# Patient Record
Sex: Male | Born: 1975 | Race: Black or African American | Hispanic: No | Marital: Married | Smoking: Never smoker
Health system: Southern US, Community
[De-identification: ages and names within clinical notes are randomized; demographics above are authoritative.]

## PROBLEM LIST (undated history)

## (undated) DIAGNOSIS — M1990 Unspecified osteoarthritis, unspecified site: Secondary | ICD-10-CM

## (undated) DIAGNOSIS — M1612 Unilateral primary osteoarthritis, left hip: Secondary | ICD-10-CM

## (undated) DIAGNOSIS — D571 Sickle-cell disease without crisis: Secondary | ICD-10-CM

## (undated) DIAGNOSIS — I10 Essential (primary) hypertension: Secondary | ICD-10-CM

## (undated) DIAGNOSIS — F329 Major depressive disorder, single episode, unspecified: Secondary | ICD-10-CM

## (undated) DIAGNOSIS — F32A Depression, unspecified: Secondary | ICD-10-CM

## (undated) DIAGNOSIS — E119 Type 2 diabetes mellitus without complications: Secondary | ICD-10-CM

## (undated) HISTORY — PX: JOINT REPLACEMENT: SHX530

## (undated) HISTORY — DX: Type 2 diabetes mellitus without complications: E11.9

---

## 2005-01-14 ENCOUNTER — Ambulatory Visit: Payer: Self-pay | Admitting: Nurse Practitioner

## 2008-12-01 ENCOUNTER — Encounter: Admission: RE | Admit: 2008-12-01 | Discharge: 2008-12-01 | Payer: Self-pay | Admitting: Specialist

## 2010-09-29 HISTORY — PX: JOINT REPLACEMENT: SHX530

## 2010-11-20 ENCOUNTER — Other Ambulatory Visit (HOSPITAL_BASED_OUTPATIENT_CLINIC_OR_DEPARTMENT_OTHER): Payer: Self-pay | Admitting: Orthopedic Surgery

## 2010-11-20 ENCOUNTER — Ambulatory Visit (HOSPITAL_COMMUNITY)
Admission: RE | Admit: 2010-11-20 | Discharge: 2010-11-20 | Disposition: A | Discharge status: Home / Self Care | Payer: Managed Care, Other (non HMO) | Source: Ambulatory Visit | Attending: Orthopedic Surgery | Admitting: Orthopedic Surgery

## 2010-11-20 ENCOUNTER — Encounter (HOSPITAL_COMMUNITY)
Admission: RE | Admit: 2010-11-20 | Discharge: 2010-11-20 | Disposition: A | Discharge status: Home / Self Care | Payer: Managed Care, Other (non HMO) | Source: Ambulatory Visit | Attending: Orthopedic Surgery | Admitting: Orthopedic Surgery

## 2010-11-20 DIAGNOSIS — M1610 Unilateral primary osteoarthritis, unspecified hip: Secondary | ICD-10-CM | POA: Insufficient documentation

## 2010-11-20 DIAGNOSIS — Z01818 Encounter for other preprocedural examination: Secondary | ICD-10-CM | POA: Insufficient documentation

## 2010-11-20 DIAGNOSIS — M169 Osteoarthritis of hip, unspecified: Secondary | ICD-10-CM | POA: Insufficient documentation

## 2010-11-20 DIAGNOSIS — Z01812 Encounter for preprocedural laboratory examination: Secondary | ICD-10-CM | POA: Insufficient documentation

## 2010-11-20 DIAGNOSIS — Z01810 Encounter for preprocedural cardiovascular examination: Secondary | ICD-10-CM | POA: Insufficient documentation

## 2010-11-20 LAB — CBC
HCT: 35.3 % — ABNORMAL LOW (ref 39.0–52.0)
Hemoglobin: 12.7 g/dL — ABNORMAL LOW (ref 13.0–17.0)
MCH: 29.4 pg (ref 26.0–34.0)
MCHC: 36 g/dL (ref 30.0–36.0)
MCV: 81.7 fL (ref 78.0–100.0)
Platelets: 313 10*3/uL (ref 150–400)
RBC: 4.32 MIL/uL (ref 4.22–5.81)
RDW: 16.4 % — ABNORMAL HIGH (ref 11.5–15.5)
WBC: 9.3 10*3/uL (ref 4.0–10.5)

## 2010-11-20 LAB — PROTIME-INR
INR: 0.93 (ref 0.00–1.49)
Prothrombin Time: 12.7 seconds (ref 11.6–15.2)

## 2010-11-20 LAB — BASIC METABOLIC PANEL
BUN: 9 mg/dL (ref 6–23)
CO2: 32 mEq/L (ref 19–32)
Calcium: 9.9 mg/dL (ref 8.4–10.5)
Chloride: 104 mEq/L (ref 96–112)
Creatinine, Ser: 0.93 mg/dL (ref 0.4–1.5)
GFR calc Af Amer: >60 mL/min (ref >60)
GFR calc non Af Amer: >60 mL/min (ref >60)
Glucose, Bld: 81 mg/dL (ref 70–99)
Potassium: 4.1 mEq/L (ref 3.5–5.1)
Sodium: 142 mEq/L (ref 135–145)

## 2010-11-20 LAB — SURGICAL PCR SCREEN
MRSA, PCR: NEGATIVE
Staphylococcus aureus: POSITIVE — AB

## 2010-11-26 ENCOUNTER — Inpatient Hospital Stay (HOSPITAL_COMMUNITY)
Admission: RE | Admit: 2010-11-26 | Discharge: 2010-11-29 | DRG: 470 | Disposition: A | Discharge status: Home Health | Payer: Managed Care, Other (non HMO) | Source: Ambulatory Visit | Attending: Orthopedic Surgery | Admitting: Orthopedic Surgery

## 2010-11-26 ENCOUNTER — Inpatient Hospital Stay (HOSPITAL_COMMUNITY): Payer: Managed Care, Other (non HMO)

## 2010-11-26 DIAGNOSIS — D573 Sickle-cell trait: Secondary | ICD-10-CM | POA: Diagnosis present

## 2010-11-26 DIAGNOSIS — I10 Essential (primary) hypertension: Secondary | ICD-10-CM | POA: Diagnosis present

## 2010-11-26 DIAGNOSIS — J9819 Other pulmonary collapse: Secondary | ICD-10-CM | POA: Diagnosis not present

## 2010-11-26 DIAGNOSIS — E7800 Pure hypercholesterolemia, unspecified: Secondary | ICD-10-CM | POA: Diagnosis present

## 2010-11-26 DIAGNOSIS — M87059 Idiopathic aseptic necrosis of unspecified femur: Principal | ICD-10-CM | POA: Diagnosis present

## 2010-11-26 DIAGNOSIS — Z79899 Other long term (current) drug therapy: Secondary | ICD-10-CM

## 2010-11-26 DIAGNOSIS — Z7982 Long term (current) use of aspirin: Secondary | ICD-10-CM

## 2010-11-26 DIAGNOSIS — E119 Type 2 diabetes mellitus without complications: Secondary | ICD-10-CM | POA: Diagnosis present

## 2010-11-26 LAB — GLUCOSE, CAPILLARY
Glucose-Capillary: 120 mg/dL — ABNORMAL HIGH (ref 70–99)
Glucose-Capillary: 120 mg/dL — ABNORMAL HIGH (ref 70–99)
Glucose-Capillary: 151 mg/dL — ABNORMAL HIGH (ref 70–99)
Glucose-Capillary: 142 mg/dL — ABNORMAL HIGH (ref 70–99)

## 2010-11-26 LAB — TYPE AND SCREEN
ABO/RH(D): O NEG
Antibody Screen: NEGATIVE

## 2010-11-26 LAB — ABO/RH: ABO/RH(D): O NEG

## 2010-11-27 ENCOUNTER — Inpatient Hospital Stay (HOSPITAL_COMMUNITY): Payer: Managed Care, Other (non HMO)

## 2010-11-27 LAB — PROTIME-INR
INR: 1.08 (ref 0.00–1.49)
Prothrombin Time: 14.2 seconds (ref 11.6–15.2)

## 2010-11-27 LAB — BASIC METABOLIC PANEL
BUN: 14 mg/dL (ref 6–23)
CO2: 28 mEq/L (ref 19–32)
Calcium: 8.1 mg/dL — ABNORMAL LOW (ref 8.4–10.5)
Chloride: 101 mEq/L (ref 96–112)
Creatinine, Ser: 1.21 mg/dL (ref 0.4–1.5)
GFR calc Af Amer: >60 mL/min (ref >60)
GFR calc non Af Amer: >60 mL/min (ref >60)
Glucose, Bld: 131 mg/dL — ABNORMAL HIGH (ref 70–99)
Potassium: 4 mEq/L (ref 3.5–5.1)
Sodium: 138 mEq/L (ref 135–145)

## 2010-11-27 LAB — URINALYSIS, ROUTINE W REFLEX MICROSCOPIC
Bilirubin Urine: NEGATIVE
Hgb urine dipstick: NEGATIVE
Ketones, ur: NEGATIVE mg/dL
Nitrite: NEGATIVE
Protein, ur: NEGATIVE mg/dL
Specific Gravity, Urine: 1.019 (ref 1.005–1.030)
Urine Glucose, Fasting: NEGATIVE mg/dL
Urobilinogen, UA: 1 mg/dL (ref 0.0–1.0)
pH: 6 (ref 5.0–8.0)

## 2010-11-27 LAB — CBC
HCT: 26.6 % — ABNORMAL LOW (ref 39.0–52.0)
Hemoglobin: 9.3 g/dL — ABNORMAL LOW (ref 13.0–17.0)
MCH: 27.9 pg (ref 26.0–34.0)
MCHC: 35 g/dL (ref 30.0–36.0)
MCV: 79.9 fL (ref 78.0–100.0)
Platelets: 284 10*3/uL (ref 150–400)
RBC: 3.33 MIL/uL — ABNORMAL LOW (ref 4.22–5.81)
RDW: 16.6 % — ABNORMAL HIGH (ref 11.5–15.5)
WBC: 15.2 10*3/uL — ABNORMAL HIGH (ref 4.0–10.5)

## 2010-11-27 LAB — URINE MICROSCOPIC-ADD ON

## 2010-11-27 LAB — GLUCOSE, CAPILLARY
Glucose-Capillary: 111 mg/dL — ABNORMAL HIGH (ref 70–99)
Glucose-Capillary: 131 mg/dL — ABNORMAL HIGH (ref 70–99)

## 2010-11-28 LAB — BASIC METABOLIC PANEL
BUN: 15 mg/dL (ref 6–23)
CO2: 28 mEq/L (ref 19–32)
Calcium: 7.9 mg/dL — ABNORMAL LOW (ref 8.4–10.5)
Chloride: 109 mEq/L (ref 96–112)
Creatinine, Ser: 1.29 mg/dL (ref 0.4–1.5)
GFR calc Af Amer: >60 mL/min (ref >60)
GFR calc non Af Amer: >60 mL/min (ref >60)
Glucose, Bld: 141 mg/dL — ABNORMAL HIGH (ref 70–99)
Potassium: 3.9 mEq/L (ref 3.5–5.1)
Sodium: 141 mEq/L (ref 135–145)

## 2010-11-28 LAB — CBC
HCT: 22 % — ABNORMAL LOW (ref 39.0–52.0)
Hemoglobin: 7.9 g/dL — ABNORMAL LOW (ref 13.0–17.0)
MCH: 29 pg (ref 26.0–34.0)
MCHC: 35.9 g/dL (ref 30.0–36.0)
MCV: 80.9 fL (ref 78.0–100.0)
Platelets: 245 10*3/uL (ref 150–400)
RBC: 2.72 MIL/uL — ABNORMAL LOW (ref 4.22–5.81)
RDW: 16.5 % — ABNORMAL HIGH (ref 11.5–15.5)
WBC: 17.3 10*3/uL — ABNORMAL HIGH (ref 4.0–10.5)

## 2010-11-28 LAB — PROTIME-INR
INR: 1.34 (ref 0.00–1.49)
Prothrombin Time: 16.8 seconds — ABNORMAL HIGH (ref 11.6–15.2)

## 2010-11-28 LAB — URINE CULTURE
Colony Count: NO GROWTH
Culture  Setup Time: 201202291754
Culture: NO GROWTH
Special Requests: NEGATIVE

## 2010-11-28 LAB — GLUCOSE, CAPILLARY
Glucose-Capillary: 142 mg/dL — ABNORMAL HIGH (ref 70–99)
Glucose-Capillary: 155 mg/dL — ABNORMAL HIGH (ref 70–99)
Glucose-Capillary: 161 mg/dL — ABNORMAL HIGH (ref 70–99)

## 2010-11-28 NOTE — Op Note (Signed)
Nicolas [name], Nicolas [name] NO.:  [id number]  MEDICAL RECORD NO.:  [id number]           PATIENT TYPE:  I  LOCATION:  5002                         FACILITY:  [facility]  PHYSICIAN:  Eulas Post, MD    DATE OF BIRTH:  [birth year]  DATE OF PROCEDURE:  11/26/2010 DATE OF DISCHARGE:                              OPERATIVE REPORT   PREOPERATIVE DIAGNOSIS:  Right hip avascular necrosis.  POSTOPERATIVE DIAGNOSIS:  Right hip avascular necrosis.  OPERATIVE PROCEDURE:  Right total hip arthroplasty.  ANESTHESIA:  General.  ESTIMATED BLOOD LOSS:  300 mL.  SURGEON:  Eulas Post, MD  FIRST ASSISTANT:  Skip Mayer, PA-C.  OPERATIVE IMPLANTS:  DePuy [place] size 2 standard femoral stem 130-mm Press-Fit with a size 52 Pinnacle acetabular shell with a size 36-mm 10- degree +4 lipped liner with a size 36 +5 femoral head and a single apex hole eliminator.  PREOPERATIVE INDICATIONS:  [name] is a 35 year old gentleman who complained of severe right hip pain.  He failed conservative measures and had substantial leg loss as far as length goes, as well as severe arthritic pain and limitation with daily activities.  He elected to undergo total hip arthroplasty.  The risks, benefits, and alternatives were discussed with him before the procedure including but not limited to the risks of infection, bleeding, nerve injury, loosening, the need for revision surgery, leg-length discrepancy, sciatic nerve injury, dislocation, blood clots, cardiopulmonary complications, the need for revision surgery, among others, and he is willing to proceed.  OPERATIVE PROCEDURE:  The patient was brought to the operating room, placed in the supine position.  IV antibiotics were given.  General anesthesia administered.  He was turned in lateral decubitus position. The right lower extremity was prepped and draped in usual sterile fashion.  Posterolateral approach to the hip was performed.   Piriformis and external rotators were released off the bone and tagged with FiberWire.  The capsule was teed.  I then dislocated the hip.  I resected the femoral neck slightly less than a thumb's breadth from the lesser trochanter.  The head itself was extremely destroyed.  I resected the proximal femur using the appropriate jig and saw.  I then exposed the acetabulum.  Deep retractors were placed, including two wings, and the acetabulum was exposed.  There was a large segment of fragmented chondral femoral head that was still within the acetabulum, due to its destructive nature, and this was removed.  I also cleaned the pulvinar and then removed the labrum.  The acetabular geometry was identified.  I then sequentially reamed starting with a 42.  I medialized slightly and then reamed up to a size 52.  This provided excellent coverage and excellent fit.  I trialed and then placed the real acetabular component. I did use a lipped liner.  A trial lip was placed.  I had excellent snug fit with the impaction of the component, and therefore I did not need screws.  The bone quality was excellent.  I then turned my attention to the femur.  The femur was exposed and I used the cookie cutter followed  by the lateralizing reamer.  His canal was very tight, and I took extra efforts to get lateral to make sure that I was not in varus.  Nonetheless, this was fairly challenging to get all the way down the femur, and even the canal finder had a difficult time finding its way down.  I lateralized at least three times, and then ultimately was able to get a clean path down the femur. Even still, the femoral shaft itself was fairly tight.  I sequentially reamed up to a size 3, and then broached, and a size 2 was the largest that would go down.  Therefore, a size 2 was trialed first with the +1 and then with the +5 femoral neck.  Leg lengths were short bilaterally, due to his contralateral disease, and so  there was no real good reference.  Nonetheless, the +5 was not too difficult to reduce, and provided excellent stability.  Therefore, the +5 was selected.  I debated on using a lipped liner versus a neutral liner, because even with the neutral liner the hip was extremely stable, but nonetheless given his young age, and predilection towards activities that would require bending and stooping, I selected a lipped liner for additional posterior coverage.  I then removed all the trial components and placed a real liner into the acetabulum.  All of the winged retractors had already been removed.  I prior to this did place a hole eliminator, and then after securing the lipped liner I then turned my attention back to the femur.  The real implant was implanted and it sat down reasonably well, although it was somewhat taller than normally would, but this was the smallest implant possible.  He had excellent rotational stability.  I trialed once more with the +5 and it restored leg length back to a normal alignment and position based on soft tissue tension from the external rotators.  Therefore, the +5 was selected and then [facility] into place and the hip was reduced.  It was extremely stable throughout a functional range of motion.  I irrigated the wounds copiously and then repaired the capsule with #2 FiberWire through drill holes in the femur using a 2-mm drill bit.  I irrigated once more and repaired the fascia with #1 Vicryl followed by 2-0 for the subcutaneous tissue and 4-0 Monocryl for the skin.  Steri-Strips and sterile gauze were also applied.  He was also injected with Marcaine.  He was awakened and turned supine and returned to the PACU in stable and satisfactory condition.  There were no complications.  He tolerated the procedure well.  Skip Mayer, PA-C, was present and scrubbed and critical throughout the case for retraction, exposure, reduction, and dislocation.     Eulas Post, MD     JPL/MEDQ  D:  11/26/2010  T:  11/27/2010  Job:  [id number]  Electronically Signed by [name] MD on 11/28/2010 10:51:22 AM

## 2010-11-29 LAB — GLUCOSE, CAPILLARY
Glucose-Capillary: 164 mg/dL — ABNORMAL HIGH (ref 70–99)
Glucose-Capillary: 113 mg/dL — ABNORMAL HIGH (ref 70–99)

## 2010-11-29 LAB — PROTIME-INR
INR: 1.87 — ABNORMAL HIGH (ref 0.00–1.49)
Prothrombin Time: 21.7 seconds — ABNORMAL HIGH (ref 11.6–15.2)

## 2010-12-02 NOTE — Discharge Summary (Signed)
  NAMEDAMERE, BRANDENBURG NO.:  [id number]  MEDICAL RECORD NO.:  [id number]           PATIENT TYPE:  I  LOCATION:  5002                         FACILITY:  [facility]  PHYSICIAN:  Eulas Post, MD    DATE OF BIRTH:  [birth year]  DATE OF ADMISSION:  11/26/2010 DATE OF DISCHARGE:  11/29/2010                              DISCHARGE SUMMARY   ADMISSION DIAGNOSIS:  Right hip avascular necrosis.  DISCHARGE DIAGNOSIS:  Right hip avascular necrosis.  PRIMARY PROCEDURE:  Right total hip arthroplasty.  ADDITIONAL DIAGNOSIS:  Expected postoperative blood loss anemia.  HOSPITAL COURSE:  [name] is a 35 year old gentleman who elected to have a right total hip arthroplasty after he had avascular necrosis and severe changes in the femoral head.  He tolerated the procedure well and postoperatively, he did not have any complications. His wounds were clean and dry and he did have a hematoma in his thigh. He is a very muscular gentleman.  He was given perioperative antibiotics for antimicrobial prophylaxis.  He was also given sequential compression devices and Coumadin as well as Lovenox for DVT prophylaxis.  On postoperative day #1, he had a fever of 103.1.  His urinalysis was checked which was negative and chest x-ray did show atelectasis.  He also had a thigh hematoma with a drop in his hemoglobin to 7.9.  He did not demonstrate any signs of sepsis in his wound.  He was placed on incentive spirometer and ambulation.  This improved, although he did have fevers on the day prior to discharge up to 101.7, but temperature current is 98.2.  His dressings were changed on postoperative day #2 and his wounds were clean and dry.  He was neurologically intact.  He benefited maximally from this hospital stay and is being discharged home on Coumadin as well as Percocet and his home medication regimen.  I am going to plan to see him back in another 2 weeks.  He will call if he is  having recurrent fevers or wound issues.  We will also have Home Health working with him to monitor his INR as well as take care of his wounds.     Eulas Post, MD     JPL/MEDQ  D:  11/29/2010  T:  11/29/2010  Job:  [id number]  Electronically Signed by [name] MD on 12/02/2010 08:29:59 AM

## 2010-12-04 LAB — CULTURE, BLOOD (ROUTINE X 2)
Culture  Setup Time: 201203010049
Culture  Setup Time: 201203010049
Culture: NO GROWTH
Culture: NO GROWTH

## 2012-02-06 ENCOUNTER — Emergency Department (HOSPITAL_COMMUNITY)
Admission: EM | Admit: 2012-02-06 | Discharge: 2012-02-06 | Disposition: A | Discharge status: Home / Self Care | Payer: Worker's Compensation | Attending: Emergency Medicine | Admitting: Emergency Medicine

## 2012-02-06 ENCOUNTER — Encounter (HOSPITAL_COMMUNITY): Payer: Self-pay | Admitting: Emergency Medicine

## 2012-02-06 DIAGNOSIS — Y9269 Other specified industrial and construction area as the place of occurrence of the external cause: Secondary | ICD-10-CM | POA: Insufficient documentation

## 2012-02-06 DIAGNOSIS — X500XXA Overexertion from strenuous movement or load, initial encounter: Secondary | ICD-10-CM | POA: Insufficient documentation

## 2012-02-06 DIAGNOSIS — M545 Low back pain, unspecified: Secondary | ICD-10-CM

## 2012-02-06 DIAGNOSIS — I10 Essential (primary) hypertension: Secondary | ICD-10-CM | POA: Insufficient documentation

## 2012-02-06 DIAGNOSIS — Z96649 Presence of unspecified artificial hip joint: Secondary | ICD-10-CM | POA: Insufficient documentation

## 2012-02-06 DIAGNOSIS — Y990 Civilian activity done for income or pay: Secondary | ICD-10-CM | POA: Insufficient documentation

## 2012-02-06 MED ORDER — DIAZEPAM 5 MG PO TABS
5.0000 mg | ORAL_TABLET | Freq: Once | ORAL | Status: AC
  Administered 2012-02-06: 5 mg via ORAL
  Filled 2012-02-06: qty 1

## 2012-02-06 MED ORDER — OXYCODONE-ACETAMINOPHEN 5-325 MG PO TABS: 2.0000 | ORAL_TABLET | ORAL | Status: AC | PRN

## 2012-02-06 MED ORDER — DIAZEPAM 5 MG PO TABS: 5.0000 mg | ORAL_TABLET | Freq: Three times a day (TID) | ORAL | Status: AC | PRN

## 2012-02-06 MED ORDER — OXYCODONE-ACETAMINOPHEN 5-325 MG PO TABS
2.0000 | ORAL_TABLET | Freq: Once | ORAL | Status: AC
  Administered 2012-02-06: 2 via ORAL
  Filled 2012-02-06: qty 2

## 2012-02-06 NOTE — ED Notes (Signed)
Pt discharge instructions reviewed, questions answered. Prescription instructions reviewed, questions answered. Pt discharged home in stable condition with family.

## 2012-02-06 NOTE — ED Notes (Signed)
Pt complains of lower back pain that started on 4/26 while he was lifting a heavy object at work. Pt states he has seen the occupational health Dr and was told he need to be on lite duty but his work would not follow these directions and had him doing heavy lifting again tonight when he re injured his back. Pt has lower back tenderness and pain with twisting movents.

## 2012-02-06 NOTE — ED Notes (Signed)
PT. 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

## 2012-02-06 NOTE — ED Provider Notes (Signed)
History     CSN: [id number]  Arrival date & time 02/06/12  0125   First MD Initiated Contact with Patient 02/06/12 0145      Chief Complaint  Patient presents with  . Back Pain    (Consider location/radiation/quality/duration/timing/severity/associated sxs/prior treatment) HPI 36 year old male presents emergency department complaining of persistent right low back pain. Patient reports he had surgery a year ago with right total hip replacement. Patient was at work 2 weeks ago when he turned and lifted something heavy, and developed pain in his right lower back. Patient thinks people muscle. Patient was seen by his primary care doctor a week ago and started on 3 medicines. He was seen this past Monday by the occupational physician and is pending a visit with chiropractor. Patient reports he had x-rays on Monday. Patient reports tonight despite being on light duty he is expected to do several tasks that aggravated his back. Patient denies any weakness or numbness no bowel or bladder dysfunction. No fevers, no midline point tenderness. No other red flags at this time Past Medical History  Diagnosis Date  . Hypertension     History reviewed. No pertinent past surgical history.  No family history on file.  History  Substance Use Topics  . Smoking status: Current Everyday Smoker  . Smokeless tobacco: Not on file  . Alcohol Use: No      Review of Systems  All other systems reviewed and are negative.    Allergies  Review of patient's allergies indicates no known allergies.  Home Medications   Current Outpatient Rx  Name Route Sig Dispense Refill  . CYCLOBENZAPRINE HCL 10 MG PO TABS Oral Take 10 mg by mouth 2 (two) times daily as needed. For spasms    . MELOXICAM 15 MG PO TABS Oral Take 15 mg by mouth daily.    . TRAMADOL-ACETAMINOPHEN 37.5-325 MG PO TABS Oral Take 2 tablets by mouth every 4 (four) hours as needed. For pain      BP 185/87  Pulse 110  Temp(Src) 98.1 F  (36.7 C) (Oral)  Resp 22  SpO2 100%  Physical Exam  Nursing note and vitals reviewed. Constitutional: He is oriented to person, place, and time. He appears well-developed and well-nourished.        Patient noted to be hypertensive, moderate amount of pain on evaluation  HENT:  Head: Normocephalic and atraumatic.  Eyes: EOM are normal. Pupils are equal, round, and reactive to light.  Neck: Normal range of motion. Neck supple. No JVD present. No tracheal deviation present. No thyromegaly present.  Cardiovascular: Normal rate, regular rhythm, normal heart sounds and intact distal pulses.  Exam reveals no gallop and no friction rub.   No murmur heard. Pulmonary/Chest: Effort normal and breath sounds normal. No stridor. No respiratory distress. He has no wheezes. He has no rales. He exhibits no tenderness.  Abdominal: Soft. Bowel sounds are normal. He exhibits no distension and no mass. There is no tenderness. There is no rebound and no guarding.  Musculoskeletal: Normal range of motion. He exhibits no edema and no tenderness.       Tenderness with palpation to paraspinal muscles in lumbar region of back right greater than left. Straight leg raise positive on right. Normal reflexes normal sensation normal movement of lower extremities  Lymphadenopathy:    He has no cervical adenopathy.  Neurological: He is alert and oriented to person, place, and time. He has normal reflexes. He displays normal reflexes. No cranial nerve deficit. He  exhibits normal muscle tone. Coordination normal.  Skin: Skin is warm and dry. No rash noted. No erythema. No pallor.  Psychiatric: He has a normal mood and affect. His behavior is normal. Judgment and thought content normal.    ED Course  Procedures (including critical care time)  Labs Reviewed - No data to display No results found.   No diagnosis found.    MDM  Low back pain in 36 year old male. Suspect ongoing muscle strain. Patient has followup  through his work planned. Will change medications and add Valium and Percocet remove Flexeril and Ultracet.         Olivia Mackie, MD 02/06/12 0300

## 2012-02-06 NOTE — Discharge Instructions (Signed)
Please followup with a chiropractor as planned with your work. Stop taking Flexeril and Ultracet. Start taking Percocet and Valium instead. Use warm heat such as a heating pad over the areas of pain. Return to the emergency department for worsening condition or new concerning symptoms.  Back Pain, Adult Low back pain is very common. About 1 in 5 people have back pain.The cause of low back pain is rarely dangerous. The pain often gets better over time.About half of people with a sudden onset of back pain feel better in just 2 weeks. About 8 in 10 people feel better by 6 weeks.  CAUSES Some common causes of back pain include:  Strain of the muscles or ligaments supporting the spine.   Wear and tear (degeneration) of the spinal discs.   Arthritis.   Direct injury to the back.  DIAGNOSIS Most of the time, the direct cause of low back pain is not known.However, back pain can be treated effectively even when the exact cause of the pain is unknown.Answering your caregiver's questions about your overall health and symptoms is one of the most accurate ways to make sure the cause of your pain is not dangerous. If your caregiver needs more information, he or she may order lab work or imaging tests (X-rays or MRIs).However, even if imaging tests show changes in your back, this usually does not require surgery. HOME CARE INSTRUCTIONS For many people, back pain returns.Since low back pain is rarely dangerous, it is often a condition that people can learn to [facility] their own.   Remain active. It is stressful on the back to sit or stand in one place. Do not sit, drive, or stand in one place for more than 30 minutes at a time. Take short walks on level surfaces as soon as pain allows.Try to increase the length of time you walk each day.   Do not stay in bed.Resting more than 1 or 2 days can delay your recovery.   Do not avoid exercise or work.Your body is made to move.It is not dangerous to be  active, even though your back may hurt.Your back will likely heal faster if you return to being active before your pain is gone.   Pay attention to your body when you bend and lift. Many people have less discomfortwhen lifting if they bend their knees, keep the load close to their bodies,and avoid twisting. Often, the most comfortable positions are those that put less stress on your recovering back.   Find a comfortable position to sleep. Use a firm mattress and lie on your side with your knees slightly bent. If you lie on your back, put a pillow under your knees.   Only take over-the-counter or prescription medicines as directed by your caregiver. Over-the-counter medicines to reduce pain and inflammation are often the most helpful.Your caregiver may prescribe muscle relaxant drugs.These medicines help dull your pain so you can more quickly return to your normal activities and healthy exercise.   Put ice on the injured area.   Put ice in a plastic bag.   Place a towel between your skin and the bag.   Leave the ice on for 15 to 20 minutes, 3 to 4 times a day for the first 2 to 3 days. After that, ice and heat may be alternated to reduce pain and spasms.   Ask your caregiver about trying back exercises and gentle massage. This may be of some benefit.   Avoid feeling anxious or stressed.Stress increases muscle  tension and can worsen back pain.It is important to recognize when you are anxious or stressed and learn ways to manage it.Exercise is a great option.  SEEK MEDICAL CARE IF:  You have pain that is not relieved with rest or medicine.   You have pain that does not improve in 1 week.   You have new symptoms.   You are generally not feeling well.  SEEK IMMEDIATE MEDICAL CARE IF:   You have pain that radiates from your back into your legs.   You develop new bowel or bladder control problems.   You have unusual weakness or numbness in your arms or legs.   You develop nausea  or vomiting.   You develop abdominal pain.   You feel faint.  Document Released: 09/15/2005 Document Revised: 09/04/2011 Document Reviewed: 02/03/2011 [facility] Patient Information 2012 [place], [place].  Back Exercises Back exercises help treat and prevent back injuries. The goal is to increase your strength in your belly (abdominal) and back muscles. These exercises can also help with flexibility. Start these exercises when told by your doctor. HOME CARE Back exercises include: Pelvic Tilt.  Lie on your back with your knees bent. Tilt your pelvis until the lower part of your back is against the floor. Hold this position 5 to 10 sec. Repeat this exercise 5 to 10 times.  Knee to Chest.  Pull 1 knee up against your chest and hold for 20 to 30 seconds. Repeat this with the other knee. This may be done with the other leg straight or bent, whichever feels better. Then, pull both knees up against your chest.  Sit-Ups or Curl-Ups.  Bend your knees 90 degrees. Start with tilting your pelvis, and do a partial, slow sit-up. Only lift your upper half 30 to 45 degrees off the floor. Take at least 2 to 3 seonds for each sit-up. Do not do sit-ups with your knees out straight. If partial sit-ups are difficult, simply do the above but with only tightening your belly (abdominal) muscles and holding it as told.  Hip-Lift.  Lie on your back with your knees flexed 90 degrees. Push down with your feet and shoulders as you raise your hips 2 inches off the floor. Hold for 10 seconds, repeat 5 to 10 times.  Back Arches.  Lie on your stomach. Prop yourself up on bent elbows. Slowly press on your hands, causing an arch in your low back. Repeat 3 to 5 times.  Shoulder-Lifts.  Lie face down with arms beside your body. Keep hips and belly pressed to floor as you slowly lift your head and shoulders off the floor.  Do not overdo your exercises. Be careful in the beginning. Exercises may cause you some mild back  discomfort. If the pain lasts for more than 15 minutes, stop the exercises until you see your doctor. Improvement with exercise for back problems is slow.  Document Released: 10/18/2010 Document Revised: 09/04/2011 Document Reviewed: 10/18/2010 [facility] Patient Information 2012 [place], [place].

## 2012-04-27 ENCOUNTER — Other Ambulatory Visit: Payer: Self-pay | Admitting: Orthopedic Surgery

## 2012-05-21 ENCOUNTER — Encounter (HOSPITAL_COMMUNITY): Payer: Self-pay | Admitting: Pharmacy Technician

## 2012-05-27 ENCOUNTER — Encounter (HOSPITAL_COMMUNITY)
Admission: RE | Admit: 2012-05-27 | Discharge: 2012-05-27 | Disposition: A | Discharge status: Home / Self Care | Payer: Managed Care, Other (non HMO) | Source: Ambulatory Visit | Attending: Orthopedic Surgery | Admitting: Orthopedic Surgery

## 2012-05-27 ENCOUNTER — Encounter (HOSPITAL_COMMUNITY): Payer: Self-pay

## 2012-05-27 HISTORY — DX: Major depressive disorder, single episode, unspecified: F32.9

## 2012-05-27 HISTORY — DX: Unspecified osteoarthritis, unspecified site: M19.90

## 2012-05-27 HISTORY — DX: Depression, unspecified: F32.A

## 2012-05-27 LAB — URINALYSIS, ROUTINE W REFLEX MICROSCOPIC
Bilirubin Urine: NEGATIVE
Glucose, UA: NEGATIVE mg/dL
Hgb urine dipstick: NEGATIVE
Ketones, ur: NEGATIVE mg/dL
Leukocytes, UA: NEGATIVE
Nitrite: NEGATIVE
Protein, ur: 30 mg/dL — AB
Specific Gravity, Urine: 1.012 (ref 1.005–1.030)
Urobilinogen, UA: 0.2 mg/dL (ref 0.0–1.0)
pH: 7 (ref 5.0–8.0)

## 2012-05-27 LAB — CBC
HCT: 36.4 % — ABNORMAL LOW (ref 39.0–52.0)
Hemoglobin: 13.1 g/dL (ref 13.0–17.0)
MCH: 26.4 pg (ref 26.0–34.0)
MCHC: 36 g/dL (ref 30.0–36.0)
MCV: 73.2 fL — ABNORMAL LOW (ref 78.0–100.0)
Platelets: 314 10*3/uL (ref 150–400)
RBC: 4.97 MIL/uL (ref 4.22–5.81)
RDW: 19 % — ABNORMAL HIGH (ref 11.5–15.5)
WBC: 11 10*3/uL — ABNORMAL HIGH (ref 4.0–10.5)

## 2012-05-27 LAB — URINE MICROSCOPIC-ADD ON

## 2012-05-27 LAB — BASIC METABOLIC PANEL
BUN: 11 mg/dL (ref 6–23)
CO2: 30 mEq/L (ref 19–32)
Calcium: 10.3 mg/dL (ref 8.4–10.5)
Chloride: 99 mEq/L (ref 96–112)
Creatinine, Ser: 1.04 mg/dL (ref 0.50–1.35)
GFR calc Af Amer: >90 mL/min (ref >90)
GFR calc non Af Amer: 90 mL/min — ABNORMAL LOW (ref >90)
Glucose, Bld: 123 mg/dL — ABNORMAL HIGH (ref 70–99)
Potassium: 3.7 mEq/L (ref 3.5–5.1)
Sodium: 140 mEq/L (ref 135–145)

## 2012-05-27 LAB — PROTIME-INR
INR: 0.93 (ref 0.00–1.49)
Prothrombin Time: 12.7 seconds (ref 11.6–15.2)

## 2012-05-27 LAB — SURGICAL PCR SCREEN
MRSA, PCR: NEGATIVE
Staphylococcus aureus: POSITIVE — AB

## 2012-05-27 NOTE — Progress Notes (Addendum)
Pt. [facility]es [facility] Care. No advanced  Cardiac studies done in the past.

## 2012-05-27 NOTE — Pre-Procedure Instructions (Signed)
20 Lambros Boxell  05/27/2012   Your procedure is scheduled on:  06/07/2012-  MONDAY  Report to [facility] at 10:30 AM.  Call this number if you have problems the morning of surgery: [phone]   Remember:   Do not eat food or drink liquid:After Midnight.--- SUNDAY      Take these medicines the morning of surgery with A SIP OF WATER: pain Medicine is OK if needed- Tramadol & Robaxin   Do not wear jewelry, make-up or nail polish.  Do not wear lotions, powders, or perfumes. You may wear deodorant.  Do not shave 48 hours prior to surgery. Men may shave face and neck.  Do not bring valuables to the hospital.  Contacts, dentures or bridgework may not be worn into surgery.  Leave suitcase in the car. After surgery it may be brought to your room.  For patients admitted to the hospital, checkout time is 11:00 AM the day of discharge.   Patients discharged the day of surgery will not be allowed to drive home.  Name and phone number of your driver: /w family  Special Instructions: CHG Shower Use Special Wash: 1/2 bottle night before surgery and 1/2 bottle morning of surgery.   Please read over the following fact sheets that you were given: Pain Booklet, Coughing and Deep Breathing, Blood Transfusion Information, MRSA Information and Surgical Site Infection Prevention

## 2012-05-28 ENCOUNTER — Other Ambulatory Visit (HOSPITAL_COMMUNITY): Payer: Self-pay

## 2012-05-28 NOTE — Progress Notes (Signed)
Pt. Notified of positive PCR .

## 2012-06-06 MED ORDER — CEFAZOLIN SODIUM-DEXTROSE 2-3 GM-% IV SOLR
2.0000 g | INTRAVENOUS | Status: AC
  Administered 2012-06-07: 2 g via INTRAVENOUS
  Filled 2012-06-06: qty 50

## 2012-06-07 ENCOUNTER — Inpatient Hospital Stay (HOSPITAL_COMMUNITY)
Admission: RE | Admit: 2012-06-07 | Discharge: 2012-06-11 | DRG: 470 | Disposition: A | Discharge status: Home Health | Payer: Managed Care, Other (non HMO) | Source: Ambulatory Visit | Attending: Orthopedic Surgery | Admitting: Orthopedic Surgery

## 2012-06-07 ENCOUNTER — Encounter (HOSPITAL_COMMUNITY): Payer: Self-pay | Admitting: Anesthesiology

## 2012-06-07 ENCOUNTER — Inpatient Hospital Stay (HOSPITAL_COMMUNITY): Payer: Managed Care, Other (non HMO)

## 2012-06-07 ENCOUNTER — Encounter (HOSPITAL_COMMUNITY): Payer: Self-pay | Admitting: Orthopedic Surgery

## 2012-06-07 ENCOUNTER — Encounter (HOSPITAL_COMMUNITY)
Admission: RE | Disposition: A | Discharge status: Home Health | Payer: Self-pay | Source: Ambulatory Visit | Attending: Orthopedic Surgery

## 2012-06-07 ENCOUNTER — Encounter (HOSPITAL_COMMUNITY): Payer: Self-pay | Admitting: Surgery

## 2012-06-07 ENCOUNTER — Ambulatory Visit (HOSPITAL_COMMUNITY): Payer: Managed Care, Other (non HMO) | Admitting: Anesthesiology

## 2012-06-07 DIAGNOSIS — D62 Acute posthemorrhagic anemia: Secondary | ICD-10-CM | POA: Diagnosis not present

## 2012-06-07 DIAGNOSIS — M1610 Unilateral primary osteoarthritis, unspecified hip: Principal | ICD-10-CM | POA: Diagnosis present

## 2012-06-07 DIAGNOSIS — M169 Osteoarthritis of hip, unspecified: Principal | ICD-10-CM | POA: Diagnosis present

## 2012-06-07 DIAGNOSIS — R5082 Postprocedural fever: Secondary | ICD-10-CM | POA: Diagnosis not present

## 2012-06-07 DIAGNOSIS — I10 Essential (primary) hypertension: Secondary | ICD-10-CM | POA: Diagnosis present

## 2012-06-07 DIAGNOSIS — Z96649 Presence of unspecified artificial hip joint: Secondary | ICD-10-CM

## 2012-06-07 DIAGNOSIS — F3289 Other specified depressive episodes: Secondary | ICD-10-CM | POA: Diagnosis present

## 2012-06-07 DIAGNOSIS — F329 Major depressive disorder, single episode, unspecified: Secondary | ICD-10-CM | POA: Diagnosis present

## 2012-06-07 DIAGNOSIS — R58 Hemorrhage, not elsewhere classified: Secondary | ICD-10-CM | POA: Diagnosis not present

## 2012-06-07 DIAGNOSIS — M1612 Unilateral primary osteoarthritis, left hip: Secondary | ICD-10-CM | POA: Diagnosis present

## 2012-06-07 HISTORY — PX: TOTAL HIP ARTHROPLASTY: SHX124

## 2012-06-07 HISTORY — DX: Unilateral primary osteoarthritis, left hip: M16.12

## 2012-06-07 SURGERY — ARTHROPLASTY, HIP, TOTAL,POSTERIOR APPROACH
Anesthesia: General | Site: Hip | Laterality: Left | Wound class: Clean

## 2012-06-07 MED ORDER — PROPOFOL 10 MG/ML IV BOLUS
INTRAVENOUS | Status: DC | PRN
  Administered 2012-06-07: 200 mg via INTRAVENOUS

## 2012-06-07 MED ORDER — HYDROMORPHONE HCL PF 1 MG/ML IJ SOLN
1.0000 mg | INTRAMUSCULAR | Status: DC | PRN
  Administered 2012-06-08 – 2012-06-10 (×7): 1 mg via INTRAVENOUS
  Filled 2012-06-07 (×9): qty 1

## 2012-06-07 MED ORDER — KETOROLAC TROMETHAMINE 15 MG/ML IJ SOLN
15.0000 mg | Freq: Four times a day (QID) | INTRAMUSCULAR | Status: AC
  Administered 2012-06-07 – 2012-06-08 (×4): 15 mg via INTRAVENOUS
  Filled 2012-06-07 (×4): qty 1

## 2012-06-07 MED ORDER — HYDROMORPHONE HCL PF 1 MG/ML IJ SOLN
INTRAMUSCULAR | Status: AC
  Administered 2012-06-07: 0.5 mg via INTRAVENOUS
  Filled 2012-06-07: qty 1

## 2012-06-07 MED ORDER — WARFARIN - PHARMACIST DOSING INPATIENT
Freq: Every day | Status: DC
  Administered 2012-06-08: 18:00:00

## 2012-06-07 MED ORDER — METHOCARBAMOL 500 MG PO TABS
500.0000 mg | ORAL_TABLET | Freq: Four times a day (QID) | ORAL | Status: DC | PRN
  Administered 2012-06-08 – 2012-06-11 (×8): 500 mg via ORAL
  Filled 2012-06-07 (×8): qty 1

## 2012-06-07 MED ORDER — SENNA 8.6 MG PO TABS
1.0000 | ORAL_TABLET | Freq: Two times a day (BID) | ORAL | Status: DC
  Administered 2012-06-07 – 2012-06-11 (×8): 8.6 mg via ORAL
  Filled 2012-06-07 (×9): qty 1

## 2012-06-07 MED ORDER — ONDANSETRON HCL 4 MG PO TABS: 4.0000 mg | ORAL_TABLET | Freq: Four times a day (QID) | ORAL | Status: DC | PRN

## 2012-06-07 MED ORDER — MENTHOL 3 MG MT LOZG: 1.0000 | LOZENGE | OROMUCOSAL | Status: DC | PRN

## 2012-06-07 MED ORDER — VITAMIN D3 25 MCG (1000 UNIT) PO TABS
2000.0000 [IU] | ORAL_TABLET | Freq: Every day | ORAL | Status: DC
  Administered 2012-06-07: 1000 [IU] via ORAL
  Administered 2012-06-08 – 2012-06-11 (×4): 2000 [IU] via ORAL
  Filled 2012-06-07 (×5): qty 2

## 2012-06-07 MED ORDER — SODIUM CHLORIDE 0.9 % IR SOLN
Status: DC | PRN
  Administered 2012-06-07: 1000 mL

## 2012-06-07 MED ORDER — ONDANSETRON HCL 4 MG/2ML IJ SOLN: 4.0000 mg | Freq: Four times a day (QID) | INTRAMUSCULAR | Status: DC | PRN

## 2012-06-07 MED ORDER — HYDROMORPHONE HCL PF 1 MG/ML IJ SOLN
INTRAMUSCULAR | Status: AC
  Administered 2012-06-07: 0.25 mg via INTRAVENOUS
  Filled 2012-06-07: qty 1

## 2012-06-07 MED ORDER — LACTATED RINGERS IV SOLN
INTRAVENOUS | Status: DC
  Administered 2012-06-07: 12:00:00 via INTRAVENOUS

## 2012-06-07 MED ORDER — OXYCODONE-ACETAMINOPHEN 10-325 MG PO TABS: 1.0000 | ORAL_TABLET | Freq: Four times a day (QID) | ORAL | Status: AC | PRN

## 2012-06-07 MED ORDER — DEXTROSE 5 % IV SOLN
500.0000 mg | INTRAVENOUS | Status: DC
  Administered 2012-06-07: 500 mg via INTRAVENOUS
  Filled 2012-06-07: qty 5

## 2012-06-07 MED ORDER — GLYCOPYRROLATE 0.2 MG/ML IJ SOLN
INTRAMUSCULAR | Status: DC | PRN
  Administered 2012-06-07: 0.6 mg via INTRAVENOUS

## 2012-06-07 MED ORDER — METOCLOPRAMIDE HCL 10 MG PO TABS: 5.0000 mg | ORAL_TABLET | Freq: Three times a day (TID) | ORAL | Status: DC | PRN

## 2012-06-07 MED ORDER — ACETAMINOPHEN 10 MG/ML IV SOLN
1000.0000 mg | Freq: Four times a day (QID) | INTRAVENOUS | Status: AC
  Administered 2012-06-07 – 2012-06-08 (×4): 1000 mg via INTRAVENOUS
  Filled 2012-06-07 (×4): qty 100

## 2012-06-07 MED ORDER — PHENOL 1.4 % MT LIQD: 1.0000 | OROMUCOSAL | Status: DC | PRN

## 2012-06-07 MED ORDER — VECURONIUM BROMIDE 10 MG IV SOLR
INTRAVENOUS | Status: DC | PRN
  Administered 2012-06-07 (×4): 2 mg via INTRAVENOUS

## 2012-06-07 MED ORDER — OXYCODONE HCL 5 MG PO TABS
5.0000 mg | ORAL_TABLET | ORAL | Status: DC | PRN
  Administered 2012-06-07: 5 mg via ORAL
  Administered 2012-06-08 – 2012-06-11 (×14): 10 mg via ORAL
  Filled 2012-06-07 (×8): qty 2
  Filled 2012-06-07: qty 1
  Filled 2012-06-07 (×7): qty 2

## 2012-06-07 MED ORDER — PHENYLEPHRINE HCL 10 MG/ML IJ SOLN
INTRAMUSCULAR | Status: DC | PRN
  Administered 2012-06-07 (×4): 40 ug via INTRAVENOUS
  Administered 2012-06-07: 80 ug via INTRAVENOUS
  Administered 2012-06-07: 40 ug via INTRAVENOUS
  Administered 2012-06-07: 80 ug via INTRAVENOUS
  Administered 2012-06-07: 40 ug via INTRAVENOUS
  Administered 2012-06-07: 80 ug via INTRAVENOUS

## 2012-06-07 MED ORDER — ACETAMINOPHEN 325 MG PO TABS
650.0000 mg | ORAL_TABLET | Freq: Four times a day (QID) | ORAL | Status: DC | PRN
  Administered 2012-06-09 – 2012-06-11 (×5): 650 mg via ORAL
  Filled 2012-06-07 (×6): qty 2

## 2012-06-07 MED ORDER — AMLODIPINE BESYLATE 10 MG PO TABS
10.0000 mg | ORAL_TABLET | Freq: Every day | ORAL | Status: DC
  Administered 2012-06-07 – 2012-06-11 (×5): 10 mg via ORAL
  Filled 2012-06-07 (×6): qty 1

## 2012-06-07 MED ORDER — CHOLECALCIFEROL 50 MCG (2000 UT) PO TABS
2000.0000 [IU] | ORAL_TABLET | Freq: Every day | ORAL | Status: DC
Start: 2012-06-07 — End: 2012-06-07

## 2012-06-07 MED ORDER — METHOCARBAMOL 100 MG/ML IJ SOLN
500.0000 mg | Freq: Four times a day (QID) | INTRAVENOUS | Status: DC | PRN
  Filled 2012-06-07: qty 5

## 2012-06-07 MED ORDER — CEFAZOLIN SODIUM-DEXTROSE 2-3 GM-% IV SOLR
2.0000 g | Freq: Four times a day (QID) | INTRAVENOUS | Status: AC
  Administered 2012-06-07 – 2012-06-08 (×2): 2 g via INTRAVENOUS
  Filled 2012-06-07 (×2): qty 50

## 2012-06-07 MED ORDER — COUMADIN BOOK
Freq: Once | Status: AC
  Administered 2012-06-08: 05:00:00
  Filled 2012-06-07: qty 1

## 2012-06-07 MED ORDER — DIPHENHYDRAMINE HCL 12.5 MG/5ML PO ELIX
12.5000 mg | ORAL_SOLUTION | ORAL | Status: DC | PRN
  Administered 2012-06-08 – 2012-06-09 (×2): 25 mg via ORAL
  Filled 2012-06-07 (×2): qty 5

## 2012-06-07 MED ORDER — BUPIVACAINE HCL (PF) 0.25 % IJ SOLN
INTRAMUSCULAR | Status: DC | PRN
  Administered 2012-06-07: 20 mL

## 2012-06-07 MED ORDER — FENTANYL CITRATE 0.05 MG/ML IJ SOLN
INTRAMUSCULAR | Status: DC | PRN
  Administered 2012-06-07 (×5): 50 ug via INTRAVENOUS
  Administered 2012-06-07: 100 ug via INTRAVENOUS
  Administered 2012-06-07 (×2): 50 ug via INTRAVENOUS

## 2012-06-07 MED ORDER — BUPIVACAINE HCL (PF) 0.25 % IJ SOLN
INTRAMUSCULAR | Status: AC
  Filled 2012-06-07: qty 30

## 2012-06-07 MED ORDER — PROMETHAZINE HCL 25 MG PO TABS: 25.0000 mg | ORAL_TABLET | Freq: Four times a day (QID) | ORAL | Status: DC | PRN

## 2012-06-07 MED ORDER — ACETAMINOPHEN 10 MG/ML IV SOLN
INTRAVENOUS | Status: AC
  Filled 2012-06-07: qty 100

## 2012-06-07 MED ORDER — ENOXAPARIN SODIUM 40 MG/0.4ML ~~LOC~~ SOLN
40.0000 mg | SUBCUTANEOUS | Status: DC
Start: 2012-06-08 — End: 2012-06-11
  Administered 2012-06-08 – 2012-06-11 (×4): 40 mg via SUBCUTANEOUS
  Filled 2012-06-07 (×5): qty 0.4

## 2012-06-07 MED ORDER — ACETAMINOPHEN 650 MG RE SUPP: 650.0000 mg | Freq: Four times a day (QID) | RECTAL | Status: DC | PRN

## 2012-06-07 MED ORDER — LIDOCAINE HCL (CARDIAC) 20 MG/ML IV SOLN
INTRAVENOUS | Status: DC | PRN
  Administered 2012-06-07: 100 mg via INTRAVENOUS

## 2012-06-07 MED ORDER — METHOCARBAMOL 500 MG PO TABS: 500.0000 mg | ORAL_TABLET | Freq: Four times a day (QID) | ORAL | Status: AC

## 2012-06-07 MED ORDER — ALUM & MAG HYDROXIDE-SIMETH 200-200-20 MG/5ML PO SUSP: 30.0000 mL | ORAL | Status: DC | PRN

## 2012-06-07 MED ORDER — EPHEDRINE SULFATE 50 MG/ML IJ SOLN
INTRAMUSCULAR | Status: DC | PRN
  Administered 2012-06-07: 5 mg via INTRAVENOUS
  Administered 2012-06-07 (×2): 10 mg via INTRAVENOUS

## 2012-06-07 MED ORDER — METOCLOPRAMIDE HCL 5 MG/ML IJ SOLN: 5.0000 mg | Freq: Three times a day (TID) | INTRAMUSCULAR | Status: DC | PRN

## 2012-06-07 MED ORDER — AMLODIPINE-OLMESARTAN 10-40 MG PO TABS
1.0000 | ORAL_TABLET | Freq: Every day | ORAL | Status: DC
Start: 2012-06-07 — End: 2012-06-07

## 2012-06-07 MED ORDER — NEOSTIGMINE METHYLSULFATE 1 MG/ML IJ SOLN
INTRAMUSCULAR | Status: DC | PRN
  Administered 2012-06-07: 5 mg via INTRAVENOUS

## 2012-06-07 MED ORDER — POLYETHYLENE GLYCOL 3350 17 G PO PACK: 17.0000 g | PACK | Freq: Every day | ORAL | Status: DC | PRN

## 2012-06-07 MED ORDER — ACETAMINOPHEN 10 MG/ML IV SOLN
INTRAVENOUS | Status: DC | PRN
  Administered 2012-06-07: 1000 mg via INTRAVENOUS

## 2012-06-07 MED ORDER — ONDANSETRON HCL 4 MG/2ML IJ SOLN
INTRAMUSCULAR | Status: DC | PRN
  Administered 2012-06-07: 4 mg via INTRAVENOUS

## 2012-06-07 MED ORDER — ADULT MULTIVITAMIN W/MINERALS CH
1.0000 | ORAL_TABLET | Freq: Every day | ORAL | Status: DC
  Administered 2012-06-07 – 2012-06-11 (×5): 1 via ORAL
  Filled 2012-06-07 (×5): qty 1

## 2012-06-07 MED ORDER — ALBUMIN HUMAN 5 % IV SOLN
INTRAVENOUS | Status: DC | PRN
  Administered 2012-06-07 (×3): via INTRAVENOUS

## 2012-06-07 MED ORDER — DOCUSATE SODIUM 100 MG PO CAPS
100.0000 mg | ORAL_CAPSULE | Freq: Two times a day (BID) | ORAL | Status: DC
  Administered 2012-06-08 – 2012-06-11 (×8): 100 mg via ORAL
  Filled 2012-06-07 (×9): qty 1

## 2012-06-07 MED ORDER — SORBITOL 70 % SOLN: 30.0000 mL | Freq: Every day | Status: DC | PRN

## 2012-06-07 MED ORDER — HYDROMORPHONE HCL PF 1 MG/ML IJ SOLN
0.2500 mg | INTRAMUSCULAR | Status: DC | PRN
  Administered 2012-06-07: 0.5 mg via INTRAVENOUS
  Administered 2012-06-07: 0.25 mg via INTRAVENOUS
  Administered 2012-06-07: 0.5 mg via INTRAVENOUS
  Administered 2012-06-07: 0.25 mg via INTRAVENOUS

## 2012-06-07 MED ORDER — POTASSIUM CHLORIDE IN NACL 20-0.45 MEQ/L-% IV SOLN
INTRAVENOUS | Status: DC
  Administered 2012-06-08: 19:00:00 via INTRAVENOUS
  Administered 2012-06-08: 75 mL/h via INTRAVENOUS
  Administered 2012-06-09 – 2012-06-10 (×2): via INTRAVENOUS
  Filled 2012-06-07 (×9): qty 1000

## 2012-06-07 MED ORDER — MIDAZOLAM HCL 5 MG/5ML IJ SOLN
INTRAMUSCULAR | Status: DC | PRN
  Administered 2012-06-07: 2 mg via INTRAVENOUS

## 2012-06-07 MED ORDER — ROCURONIUM BROMIDE 100 MG/10ML IV SOLN
INTRAVENOUS | Status: DC | PRN
  Administered 2012-06-07: 50 mg via INTRAVENOUS

## 2012-06-07 MED ORDER — IRBESARTAN 300 MG PO TABS
300.0000 mg | ORAL_TABLET | Freq: Every day | ORAL | Status: DC
  Administered 2012-06-07 – 2012-06-11 (×5): 300 mg via ORAL
  Filled 2012-06-07 (×5): qty 1

## 2012-06-07 MED ORDER — WARFARIN SODIUM 10 MG PO TABS
10.0000 mg | ORAL_TABLET | Freq: Once | ORAL | Status: AC
  Administered 2012-06-07: 10 mg via ORAL
  Filled 2012-06-07: qty 1

## 2012-06-07 MED ORDER — LACTATED RINGERS IV SOLN
INTRAVENOUS | Status: DC | PRN
  Administered 2012-06-07 (×3): via INTRAVENOUS

## 2012-06-07 MED ORDER — WARFARIN VIDEO
Freq: Once | Status: AC
  Administered 2012-06-08: 12:00:00

## 2012-06-07 SURGICAL SUPPLY — 63 items
APL SKNCLS STERI-STRIP NONHPOA (GAUZE/BANDAGES/DRESSINGS) ×1
BENZOIN TINCTURE PRP APPL 2/3 (GAUZE/BANDAGES/DRESSINGS) ×2 IMPLANT
BLADE SAW SAG 73X25 THK (BLADE) ×1
BLADE SAW SGTL 73X25 THK (BLADE) ×1 IMPLANT
BRUSH FEMORAL CANAL (MISCELLANEOUS) IMPLANT
CLOTH BEACON ORANGE TIMEOUT ST (SAFETY) ×2 IMPLANT
COVER BACK TABLE 24X17X13 BIG (DRAPES) IMPLANT
COVER SURGICAL LIGHT HANDLE (MISCELLANEOUS) ×2 IMPLANT
DRAPE INCISE IOBAN 66X45 STRL (DRAPES) IMPLANT
DRAPE ORTHO SPLIT 77X108 STRL (DRAPES) ×4
DRAPE PROXIMA HALF (DRAPES) ×2 IMPLANT
DRAPE SURG ORHT 6 SPLT 77X108 (DRAPES) ×2 IMPLANT
DRAPE U-SHAPE 47X51 STRL (DRAPES) ×2 IMPLANT
DRILL BIT 5/64 (BIT) ×2 IMPLANT
DRSG MEPILEX BORDER 4X12 (GAUZE/BANDAGES/DRESSINGS) IMPLANT
DRSG MEPILEX BORDER 4X8 (GAUZE/BANDAGES/DRESSINGS) IMPLANT
DRSG PAD ABDOMINAL 8X10 ST (GAUZE/BANDAGES/DRESSINGS) ×2 IMPLANT
DURAPREP 26ML APPLICATOR (WOUND CARE) ×2 IMPLANT
ELECT BLADE 4.0 EZ CLEAN MEGAD (MISCELLANEOUS) ×2
ELECT CAUTERY BLADE 6.4 (BLADE) ×2 IMPLANT
ELECT REM PT RETURN 9FT ADLT (ELECTROSURGICAL) ×2
ELECTRODE BLDE 4.0 EZ CLN MEGD (MISCELLANEOUS) IMPLANT
ELECTRODE REM PT RTRN 9FT ADLT (ELECTROSURGICAL) ×1 IMPLANT
EVACUATOR 1/8 PVC DRAIN (DRAIN) IMPLANT
GLOVE BIOGEL PI IND STRL 8 (GLOVE) ×1 IMPLANT
GLOVE BIOGEL PI INDICATOR 8 (GLOVE) ×1
GLOVE ORTHO TXT STRL SZ7.5 (GLOVE) ×2 IMPLANT
GLOVE SURG ORTHO 8.0 STRL STRW (GLOVE) ×4 IMPLANT
GOWN STRL NON-REIN LRG LVL3 (GOWN DISPOSABLE) IMPLANT
HANDPIECE INTERPULSE COAX TIP (DISPOSABLE)
HOOD PEEL AWAY FACE SHEILD DIS (HOOD) ×4 IMPLANT
KIT BASIN OR (CUSTOM PROCEDURE TRAY) ×2 IMPLANT
KIT ROOM TURNOVER OR (KITS) ×2 IMPLANT
MANIFOLD NEPTUNE II (INSTRUMENTS) ×2 IMPLANT
NDL HYPO 25GX1X1/2 BEV (NEEDLE) ×1 IMPLANT
NEEDLE HYPO 25GX1X1/2 BEV (NEEDLE) ×2 IMPLANT
NS IRRIG 1000ML POUR BTL (IV SOLUTION) ×2 IMPLANT
PACK TOTAL JOINT (CUSTOM PROCEDURE TRAY) ×2 IMPLANT
PAD ARMBOARD 7.5X6 YLW CONV (MISCELLANEOUS) ×4 IMPLANT
PILLOW ABDUCTION HIP (SOFTGOODS) ×2 IMPLANT
PRESSURIZER FEMORAL UNIV (MISCELLANEOUS) IMPLANT
RETRIEVER SUT HEWSON (MISCELLANEOUS) ×2 IMPLANT
SET HNDPC FAN SPRY TIP SCT (DISPOSABLE) IMPLANT
SPONGE GAUZE 4X4 12PLY (GAUZE/BANDAGES/DRESSINGS) ×1 IMPLANT
SPONGE LAP 4X18 X RAY DECT (DISPOSABLE) IMPLANT
STRIP CLOSURE SKIN 1/2X4 (GAUZE/BANDAGES/DRESSINGS) ×4 IMPLANT
SUCTION FRAZIER TIP 10 FR DISP (SUCTIONS) ×2 IMPLANT
SUT FIBERWIRE #2 38 REV NDL BL (SUTURE) ×6
SUT FIBERWIRE #2 38 T-5 BLUE (SUTURE)
SUT MNCRL AB 4-0 PS2 18 (SUTURE) IMPLANT
SUT VIC AB 0 CT1 27 (SUTURE) ×2
SUT VIC AB 0 CT1 27XBRD ANBCTR (SUTURE) ×1 IMPLANT
SUT VIC AB 2-0 CT1 27 (SUTURE) ×2
SUT VIC AB 2-0 CT1 TAPERPNT 27 (SUTURE) ×1 IMPLANT
SUT VIC AB 3-0 SH 18 (SUTURE) ×3 IMPLANT
SUTURE FIBERWR #2 38 T-5 BLUE (SUTURE) IMPLANT
SUTURE FIBERWR#2 38 REV NDL BL (SUTURE) ×3 IMPLANT
SYR CONTROL 10ML LL (SYRINGE) ×2 IMPLANT
TOWEL OR 17X24 6PK STRL BLUE (TOWEL DISPOSABLE) ×2 IMPLANT
TOWEL OR 17X26 10 PK STRL BLUE (TOWEL DISPOSABLE) ×2 IMPLANT
TOWER CARTRIDGE SMART MIX (DISPOSABLE) IMPLANT
TRAY FOLEY CATH 14FR (SET/KITS/TRAYS/PACK) ×2 IMPLANT
WATER STERILE IRR 1000ML POUR (IV SOLUTION) ×8 IMPLANT

## 2012-06-07 NOTE — Anesthesia Procedure Notes (Signed)
Procedure Name: Intubation Date/Time: 06/07/2012 1:16 PM Performed by: Romie Minus K Pre-anesthesia Checklist: Patient identified, Emergency Drugs available, Suction available, Patient being monitored and Timeout performed Patient Re-evaluated:Patient Re-evaluated prior to inductionOxygen Delivery Method: Circle system utilized Preoxygenation: Pre-oxygenation with 100% oxygen Intubation Type: IV induction Ventilation: Mask ventilation with difficulty, Two handed mask ventilation required and Oral airway inserted - appropriate to patient size Laryngoscope Size: Miller and 2 Grade View: Grade III Tube type: Oral Tube size: 7.5 mm Number of attempts: 2 Airway Equipment and Method: Stylet and LTA kit utilized Placement Confirmation: ETT inserted through vocal cords under direct vision,  positive ETCO2 and breath sounds checked- equal and bilateral Secured at: 22 cm Tube secured with: Tape Dental Injury: Teeth and Oropharynx as per pre-operative assessment  Difficulty Due To: Difficult Airway- due to large tongue and Difficult Airway- due to limited oral opening Comments: Smooth IV induction. DL x 1 by Susy Frizzle, paramedic student. Unable to view cords. OA reinserted, patient masked. DLx1 by [facility] CRNA. Miller 2. Grade 3 view. Atraumatic intubation. +ETCO2. bbs=.

## 2012-06-07 NOTE — Op Note (Signed)
06/07/2012  3:53 PM  PATIENT:  Nicolas [name]   MRN: [id number]  PRE-OPERATIVE DIAGNOSIS:  DJD LEFT HIP  POST-OPERATIVE DIAGNOSIS:  degenerative joint disease left hip  PROCEDURE:  Procedure(s): TOTAL HIP ARTHROPLASTY  PREOPERATIVE INDICATIONS:    Nicolas [name] is an 36 y.o. male who has a diagnosis of Osteoarthritis of left hip and elected for surgical management after failing conservative treatment.  The risks benefits and alternatives were discussed with the patient including but not limited to the risks of nonoperative treatment, versus surgical intervention including infection, bleeding, nerve injury, periprosthetic fracture, the need for revision surgery, dislocation, leg length discrepancy, blood clots, cardiopulmonary complications, morbidity, mortality, among others, and they were willing to proceed.     OPERATIVE REPORT     SURGEON:  [name], MD    ASSISTANT:  Janace Litten, OPA-C  (Present throughout the entire procedure,  necessary for completion of procedure in a timely manner, assisting with retraction, instrumentation, and closure)     ANESTHESIA:  General    COMPLICATIONS:  None.     COMPONENTS:  [employer] fit femur size 2 with a 36 mm +5 head ball and a pinnacle acetabular shell size 52 with a 10 degree lipped polyethylene liner    PROCEDURE IN DETAIL:   The patient was met in the holding area and  identified.  The appropriate hip was identified and marked at the operative site.  The patient was then transported to the OR  and  placed under general l anesthesia.  At that point, the patient was  placed in the lateral decubitus position with the operative side up and  secured to the operating room table and all bony prominences padded.     The operative lower extremity was prepped from the iliac crest to the distal leg.  Sterile draping was performed.  Time out was performed prior to incision.      A routine posterolateral approach was utilized via  sharp dissection  carried down to the subcutaneous tissue.  Gross bleeders were Bovie coagulated.  The iliotibial band was identified and incised along the length of the skin incision.  Self-retaining retractors were  inserted.  With the hip internally rotated, the short external rotators  were identified. The piriformis and capsule was tagged with FiberWire, and the hip capsule released in a T-type fashion.  The femoral neck was exposed, and I resected the femoral neck using the appropriate jig. This was performed at slightly less than a thumb's breadth above the lesser trochanter.    I then exposed the deep acetabulum, cleared out any tissue including the ligamentum teres.  A wing retractor was placed.  I was unable to get access to the acetabulum, and I went back and recut the femur.  After adequate visualization, I excised the labrum, and then sequentially reamed.  The labrum was extremely calcified.   I placed the trial acetabulum, which seated nicely, and then impacted the real cup into place.  I had deepened the acetabulum as much as I was comfortable, even recognizing that the lateral overhang of the cup would still be present.  Appropriate version and inclination was confirmed clinically matching their bony anatomy, and also with the use of the jig.  A trial polyethylene liner was placed and the wing retractor removed.    I then prepared the proximal femur using the cookie-cutter, the lateralizing reamer, and then sequentially reamed and broached.  A trial broach, neck, and head was utilized, and I reduced  the hip and it was found to have excellent stability with functional range of motion. The trial components were then removed, and the real polyethylene liner was placed with the lip directed posteriorly.  I then impacted the real femoral prosthesis into place into the appropriate version, slightly anteverted to the normal anatomy, and I impacted the real head ball into place. The hip was then  reduced and taken through functional range of motion and found to have excellent stability. Leg lengths were restored.  I then used a 2 mm drill bits to pass the FiberWire suture from the capsule and puriform is through the greater trochanter, and secured this. Excellent posterior capsular repair was achieved. I also closed the T in the capsule.  I then irrigated the hip copiously again with pulse lavage, and repaired the fascia with Vicryl, followed by Vicryl for the subcutaneous tissue, Monocryl for the skin, Steri-Strips and sterile gauze. The wounds were injected. The patient was then awakened and returned to PACU in stable and satisfactory condition. There were no complications.  [name], MD Orthopedic Surgeon [phone]   06/07/2012 3:53 PM

## 2012-06-07 NOTE — Progress Notes (Signed)
ANTICOAGULATION CONSULT NOTE - Initial Consult  Pharmacy Consult for Coumadin Indication: VTE prophylaxis s/p left THA  No Known Allergies  Patient Measurements: Height: 60" Weight: 115.3 kg  Vital Signs: Temp: 99.1 F (37.3 C) (09/09 1855) Temp src: Oral (09/09 1855) BP: 118/66 mmHg (09/09 1855) Pulse Rate: 90  (09/09 1855)  Labs (05/27/12): No results found for this basename: HGB:2,HCT:3,PLT:3,APTT:3,LABPROT:3,INR:3,HEPARINUNFRC:3,CREATININE:3,CKTOTAL:3,CKMB:3,TROPONINI:3 in the last 72 hours  CrCl is unknown because there is no height on file for the current visit. PT 12.7 INR 0.93 SCr 1.04 H/H/platelet 13.1/36.4/314  Medical History: Past Medical History  Diagnosis Date  . Hypertension   . Arthritis     arthritis- back & L hip  . Depression     situational depression, pt. out of work   . Osteoarthritis of left hip 06/07/2012    Medications:  Prescriptions prior to admission  Medication Sig Dispense Refill  . amLODipine-olmesartan (AZOR) 10-40 MG per tablet Take 1 tablet by mouth daily.      . Cholecalciferol (VITAMIN D3 SUPER STRENGTH) 2000 UNITS TABS Take 2,000 Units by mouth daily.      . Multiple Vitamin (MULTIVITAMIN WITH MINERALS) TABS Take 1 tablet by mouth daily.      Marland Kitchen DISCONTD: meloxicam (MOBIC) 15 MG tablet Take 15 mg by mouth daily.      Marland Kitchen DISCONTD: methocarbamol (ROBAXIN) 500 MG tablet Take 500 mg by mouth 3 (three) times daily.      Marland Kitchen DISCONTD: naproxen (NAPROSYN) 500 MG tablet Take 500 mg by mouth daily.      Marland Kitchen DISCONTD: traMADol (ULTRAM) 50 MG tablet Take 50 mg by mouth every 6 (six) hours as needed. For pain.        Assessment: 36 yo male to begin Coumadin for VTE prophylaxis s/p left THA. INR and CBC are normal at baseline. Coumadin score is 11.   Goal of Therapy:  INR 2-3   Plan:  -Coumadin 10 mg po tonight -INR daily -Coumadin book and video   [facility], [address].D., BCPS Clinical Pharmacist Pager: [phone] 06/07/2012 8:32  PM

## 2012-06-07 NOTE — Anesthesia Preprocedure Evaluation (Addendum)
Anesthesia Evaluation  Patient identified by MRN, date of birth, ID band Patient awake    Reviewed: Allergy & Precautions, H&P , NPO status , Patient's Chart, lab work & pertinent test results  History of Anesthesia Complications Negative for: history of anesthetic complications  Airway Mallampati: II TM Distance: >3 FB Neck ROM: Full    Dental  (+) Teeth Intact and Dental Advisory Given   Pulmonary  breath sounds clear to auscultation        Cardiovascular hypertension, Pt. on medications Rhythm:Regular Rate:Normal     Neuro/Psych Depression    GI/Hepatic   Endo/Other  Morbid obesity  Renal/GU      Musculoskeletal  (+) Arthritis -,   Abdominal   Peds  Hematology Sickle Cell Trait.   Anesthesia Other Findings   Reproductive/Obstetrics                          Anesthesia Physical Anesthesia Plan  ASA: II  Anesthesia Plan: General   Post-op Pain Management:    Induction: Intravenous  Airway Management Planned: Oral ETT  Additional Equipment:   Intra-op Plan:   Post-operative Plan: Extubation in OR  Informed Consent: I have reviewed the patients History and Physical, chart, labs and discussed the procedure including the risks, benefits and alternatives for the proposed anesthesia with the patient or authorized representative who has indicated his/her understanding and acceptance.   Dental advisory given  Plan Discussed with: Surgeon and Anesthesiologist  Anesthesia Plan Comments:         Anesthesia Quick Evaluation

## 2012-06-07 NOTE — H&P (Signed)
  PREOPERATIVE H&P  Chief Complaint: DJD LEFT HIP  HPI: Nicolas [name] is a 36 y.o. male who presents for preoperative history and physical with a diagnosis of DJD LEFT HIP. Symptoms are rated as moderate to severe, and have been worsening.  This is significantly impairing activities of daily living.  He has elected for surgical management. He has failed nonsurgical management, including NSAIDs, walking aids, activity modification, etc.  Past Medical History  Diagnosis Date  . Hypertension   . Arthritis     arthritis- back & L hip  . Depression     situational depression, pt. out of work    Past Surgical History  Procedure Date  . Joint replacement     R hip   History   Social History  . Marital Status: Married    Spouse Name: N/A    Number of Children: N/A  . Years of Education: N/A   Social History Main Topics  . Smoking status: Never Smoker   . Smokeless tobacco: None  . Alcohol Use: No  . Drug Use: No  . Sexually Active:    Other Topics Concern  . None   Social History Narrative  . None   History reviewed. No pertinent family history. No Known Allergies Prior to Admission medications   Medication Sig Start Date End Date Taking? Authorizing Provider  amLODipine-olmesartan (AZOR) 10-40 MG per tablet Take 1 tablet by mouth daily.   Yes Historical Provider, MD  Cholecalciferol (VITAMIN D3 SUPER STRENGTH) 2000 UNITS TABS Take 2,000 Units by mouth daily.   Yes Historical Provider, MD  meloxicam (MOBIC) 15 MG tablet Take 15 mg by mouth daily.   Yes Historical Provider, MD  methocarbamol (ROBAXIN) 500 MG tablet Take 500 mg by mouth 3 (three) times daily.   Yes Historical Provider, MD  Multiple Vitamin (MULTIVITAMIN WITH MINERALS) TABS Take 1 tablet by mouth daily.   Yes Historical Provider, MD  naproxen (NAPROSYN) 500 MG tablet Take 500 mg by mouth daily.   Yes Historical Provider, MD  traMADol (ULTRAM) 50 MG tablet Take 50 mg by mouth every 6 (six) hours as needed. For  pain.   Yes Historical Provider, MD     Positive ROS: All other systems have been reviewed and were otherwise negative with the exception of those mentioned in the HPI and as above.  Physical Exam: General: Alert, no acute distress Cardiovascular: No pedal edema Respiratory: No cyanosis, no use of accessory musculature GI: No organomegaly, abdomen is soft and non-tender Skin: No lesions in the area of chief complaint Neurologic: Sensation intact distally Psychiatric: Patient is competent for consent with normal mood and affect Lymphatic: No axillary or cervical lymphadenopathy  MUSCULOSKELETAL: left hip has severe stiffness with loss of ROM.  Assessment: DJD LEFT HIP  Plan: Plan for Procedure(s): TOTAL HIP ARTHROPLASTY  The risks benefits and alternatives were discussed with the patient including but not limited to the risks of nonoperative treatment, versus surgical intervention including infection, bleeding, nerve injury, periprosthetic fracture, the need for revision surgery, dislocation, leg length discrepancy, blood clots, cardiopulmonary complications, morbidity, mortality, among others, and they were willing to proceed.     Belma Dyches P, MD Cell [phone] Pager [phone]  06/07/2012 12:58 PM

## 2012-06-07 NOTE — Transfer of Care (Signed)
Immediate Anesthesia Transfer of Care Note  Patient: Nicolas [name]  Procedure(s) Performed: Procedure(s) (LRB) with comments: TOTAL HIP ARTHROPLASTY (Left)  Patient Location: PACU  Anesthesia Type: General  Level of Consciousness: awake, alert  and oriented  Airway & Oxygen Therapy: Patient Spontanous Breathing and Patient connected to face mask oxygen  Post-op Assessment: Report given to PACU RN and Post -op Vital signs reviewed and stable  Post vital signs: Reviewed  Complications: No apparent anesthesia complications

## 2012-06-07 NOTE — Anesthesia Postprocedure Evaluation (Signed)
Anesthesia Post Note  Patient: Nicolas [name]  Procedure(s) Performed: Procedure(s) (LRB): TOTAL HIP ARTHROPLASTY (Left)  Anesthesia type: General  Patient location: PACU  Post pain: Pain level controlled and Adequate analgesia  Post assessment: Post-op Vital signs reviewed, Patient's Cardiovascular Status Stable, Respiratory Function Stable, Patent Airway and Pain level controlled  Last Vitals:  Filed Vitals:   06/07/12 1700  BP: 140/79  Pulse: 88  Temp:   Resp: 25    Post vital signs: Reviewed and stable  Level of consciousness: awake, alert  and oriented  Complications: No apparent anesthesia complications

## 2012-06-07 NOTE — Preoperative (Signed)
Beta Blockers   Reason not to administer Beta Blockers:Not Applicable 

## 2012-06-08 ENCOUNTER — Encounter (HOSPITAL_COMMUNITY): Payer: Self-pay | Admitting: General Practice

## 2012-06-08 DIAGNOSIS — D62 Acute posthemorrhagic anemia: Secondary | ICD-10-CM | POA: Diagnosis not present

## 2012-06-08 LAB — BASIC METABOLIC PANEL
BUN: 15 mg/dL (ref 6–23)
CO2: 31 mEq/L (ref 19–32)
Calcium: 8.5 mg/dL (ref 8.4–10.5)
Chloride: 103 mEq/L (ref 96–112)
Creatinine, Ser: 1.3 mg/dL (ref 0.50–1.35)
GFR calc Af Amer: 79 mL/min — ABNORMAL LOW (ref >90)
GFR calc non Af Amer: 68 mL/min — ABNORMAL LOW (ref >90)
Glucose, Bld: 139 mg/dL — ABNORMAL HIGH (ref 70–99)
Potassium: 3.8 mEq/L (ref 3.5–5.1)
Sodium: 143 mEq/L (ref 135–145)

## 2012-06-08 LAB — CBC
HCT: 23 % — ABNORMAL LOW (ref 39.0–52.0)
Hemoglobin: 8 g/dL — ABNORMAL LOW (ref 13.0–17.0)
MCH: 26.1 pg (ref 26.0–34.0)
MCHC: 34.8 g/dL (ref 30.0–36.0)
MCV: 75.2 fL — ABNORMAL LOW (ref 78.0–100.0)
Platelets: 213 10*3/uL (ref 150–400)
RBC: 3.06 MIL/uL — ABNORMAL LOW (ref 4.22–5.81)
RDW: 21 % — ABNORMAL HIGH (ref 11.5–15.5)
WBC: 14.2 10*3/uL — ABNORMAL HIGH (ref 4.0–10.5)

## 2012-06-08 LAB — PROTIME-INR
INR: 1.16 (ref 0.00–1.49)
Prothrombin Time: 15 seconds (ref 11.6–15.2)

## 2012-06-08 MED ORDER — WARFARIN SODIUM 10 MG PO TABS
10.0000 mg | ORAL_TABLET | Freq: Once | ORAL | Status: AC
  Administered 2012-06-08: 10 mg via ORAL
  Filled 2012-06-08: qty 1

## 2012-06-08 NOTE — Progress Notes (Signed)
ANTICOAGULATION CONSULT NOTE - Initial Consult  Pharmacy Consult for Coumadin Indication: VTE prophylaxis s/p left THA  No Known Allergies  Patient Measurements: Height: 60" Weight: 115.3 kg  Vital Signs: Temp: 98.6 F (37 C) (09/10 0703) Temp src: Oral (09/09 2054) BP: 120/62 mmHg (09/10 0703) Pulse Rate: 94  (09/10 0703)  Labs (05/27/12):  Basename 06/08/12 0558  HGB 8.0*  HCT 23.0*  PLT 213  APTT --  LABPROT 15.0  INR 1.16  HEPARINUNFRC --  CREATININE 1.30  CKTOTAL --  CKMB --  TROPONINI --    CrCl is unknown because there is no height on file for the current visit. PT 12.7 INR 0.93 SCr 1.04 H/H/platelet 13.1/36.4/314  Medical History: Past Medical History  Diagnosis Date  . Hypertension   . Arthritis     arthritis- back & L hip  . Depression     situational depression, pt. out of work   . Osteoarthritis of left hip 06/07/2012    Medications:  Prescriptions prior to admission  Medication Sig Dispense Refill  . amLODipine-olmesartan (AZOR) 10-40 MG per tablet Take 1 tablet by mouth daily.      . Cholecalciferol (VITAMIN D3 SUPER STRENGTH) 2000 UNITS TABS Take 2,000 Units by mouth daily.      . Multiple Vitamin (MULTIVITAMIN WITH MINERALS) TABS Take 1 tablet by mouth daily.      Marland Kitchen DISCONTD: meloxicam (MOBIC) 15 MG tablet Take 15 mg by mouth daily.      Marland Kitchen DISCONTD: methocarbamol (ROBAXIN) 500 MG tablet Take 500 mg by mouth 3 (three) times daily.      Marland Kitchen DISCONTD: naproxen (NAPROSYN) 500 MG tablet Take 500 mg by mouth daily.      Marland Kitchen DISCONTD: traMADol (ULTRAM) 50 MG tablet Take 50 mg by mouth every 6 (six) hours as needed. For pain.        Assessment: 36 yo Nicolas [name] to begin Coumadin for VTE prophylaxis s/p left THA. INR slightly up = 1.16, hgb = 8, plt = 213. No bleeding noted per chart, also on Lovenox sq  Goal of Therapy:  INR 2-3   Plan:  -Coumadin 10 mg po tonight -INR daily -D/C lovenox when INR > 2   Bayard Hugger, PharmD, BCPS  Clinical Pharmacist    Pager: [phone]   06/08/2012 8:41 AM

## 2012-06-08 NOTE — Progress Notes (Signed)
CARE MANAGEMENT NOTE 06/08/2012  Patient:  Nicolas [name], Nicolas [name]   Account Number:  [id number]  Date Initiated:  06/08/2012  Documentation initiated by:  Anette Guarneri  Subjective/Objective Assessment:   POD#1 s/p left THA  from home w/wife, has RW and 3n1  needs HH PT     Action/Plan:   home with [facility] services  CM spoke with patient. Choice offered. Patient preoperatively setup with [facility], no changes. Has rolling walker and 3in1 from previous surgery.   Anticipated DC Date:  06/10/2012   Anticipated DC Plan:  HOME W HOME HEALTH SERVICES      DC Planning Services  CM consult      [facility] : [facility] Choice  HOME HEALTH   Choice offered to / List presented to:  C-1 Patient        HH arranged  HH-2 PT      [facility] agency  [facility]   Status of service:  Completed, signed off Medicare Important Message given?   (If response is "NO", the following Medicare IM given date fields will be blank) Date Medicare IM given:   Date Additional Medicare IM given:    Discharge Disposition:  HOME W HOME HEALTH SERVICES  Per UR Regulation:  Reviewed for med. necessity/level of care/duration of stay  If discussed at Long Length of Stay Meetings, dates discussed:    Comments:  06/08/12 11:22 Anette Guarneri RN/CM Genevieve Norlander for [facility] services pre-arranged by MD office.

## 2012-06-08 NOTE — Progress Notes (Signed)
Subjective:  Patient reports pain as moderate.  Did well with PT today.  Objective:   VITALS:   Filed Vitals:   06/07/12 2054 06/08/12 0703 06/08/12 0800 06/08/12 1200  BP: 159/87 120/62    Pulse: 102 94    Temp: 98.6 F (37 C) 98.6 F (37 C)    TempSrc: Oral     Resp: 20 18 18 18   SpO2: 100% 95%      Neurologically intact Dorsiflexion/Plantar flexion intact Incision: dressing C/D/I  LABS  Results for orders placed during the hospital encounter of 06/07/12 (from the past 24 hour(s))  PROTIME-INR     Status: Normal   Collection Time   06/08/12  5:58 AM      Component Value Range   Prothrombin Time 15.0  11.6 - 15.2 seconds   INR 1.16  0.00 - 1.49  CBC     Status: Abnormal   Collection Time   06/08/12  5:58 AM      Component Value Range   WBC 14.2 (*) 4.0 - 10.5 K/uL   RBC 3.06 (*) 4.22 - 5.81 MIL/uL   Hemoglobin 8.0 (*) 13.0 - 17.0 g/dL   HCT 40.9 (*) 81.1 - 91.4 %   MCV 75.2 (*) 78.0 - 100.0 fL   MCH 26.1  26.0 - 34.0 pg   MCHC 34.8  30.0 - 36.0 g/dL   RDW 78.2 (*) 95.6 - 21.3 %   Platelets 213  150 - 400 K/uL  BASIC METABOLIC PANEL     Status: Abnormal   Collection Time   06/08/12  5:58 AM      Component Value Range   Sodium 143  135 - 145 mEq/L   Potassium 3.8  3.5 - 5.1 mEq/L   Chloride 103  96 - 112 mEq/L   CO2 31  19 - 32 mEq/L   Glucose, Bld 139 (*) 70 - 99 mg/dL   BUN 15  6 - 23 mg/dL   Creatinine, Ser 0.86  0.50 - 1.35 mg/dL   Calcium 8.5  8.4 - 57.8 mg/dL   GFR calc non Af Amer 68 (*) >90 mL/min   GFR calc Af Amer 79 (*) >90 mL/min    Dg Pelvis Portable  06/07/2012  *RADIOLOGY REPORT*  Clinical Data: Left hip replacement.  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  Pelvic ring is intact. Nonspecific bowel gas pattern.  Left hip appears to be grossly located on the single view.  IMPRESSION: Placement of left hip arthroplasty.  No  gross abnormality.   Original Report Authenticated By: Richarda Overlie, M.D.    Dg Hip Portable 1 View Left  06/07/2012  *RADIOLOGY REPORT*  Clinical Data: Left hip replacement.  PORTABLE LEFT HIP - 1 VIEW  Comparison: 06/07/2012  Findings: Two views of the left hip demonstrate a left hip arthroplasty.  The left hip appears to be located.  No evidence for a periprosthetic fracture.  The entire femoral stem is visualized.  IMPRESSION: Left hip arthroplasty without complicating features.   Original Report Authenticated By: Richarda Overlie, M.D.     Assessment/Plan: 1 Day Post-Op   Principal Problem:  *Osteoarthritis of left hip Active Problems:  Postoperative anemia due to acute blood loss  Monitor H/H.  Asymptomatic currently.   Advance diet Up with therapy Discharge home with home health probably thurs.   Doing well.  Winnie Umali P 06/08/2012, 4:37 PM   [name], MD Cell [phone] Pager [phone]

## 2012-06-08 NOTE — Progress Notes (Signed)
Physical Therapy Evaluation Patient Details Name: Nicolas [name] MRN: [id number] DOB: 1976/09/04 Today's Date: 06/08/2012 Time: 4098-1191 PT Time Calculation (min): 28 min  PT Assessment / Plan / Recommendation Clinical Impression  Pt is a pleasent and cooperative 36 y.o. male s/p left THA.  Patient was limited in activity tolerance secondary to presenting with dizziness, tinnitus, and diaphoresis upon inital ambulation. Patient immediately advised to sit in chair, legs were reclined; sympotoms resolved immediately. Nsg made aware.  Pt presents with deficits in functional mobility secondary to pain, weakness, and precautions. Pt will continue to benefit from skilled PT to address deficits and maximize independence for discharge.     PT Assessment  Patient needs continued PT services    Follow Up Recommendations  Home health PT    Barriers to Discharge        Equipment Recommendations  Tub/shower seat    Recommendations for Other Services     Frequency 7X/week    Precautions / Restrictions Precautions Precautions: Posterior Hip Precaution Booklet Issued: Yes (comment) Restrictions Weight Bearing Restrictions: Yes Other Position/Activity Restrictions: WBAt   Pertinent Vitals/Pain 5/10      Mobility  Bed Mobility Bed Mobility: Supine to Sit;Sitting - Scoot to Edge of Bed Supine to Sit: 5: Supervision Sitting - Scoot to [place] of Bed: 4: Min assist Details for Bed Mobility Assistance: Min Assist LLE Transfers Transfers: Sit to Stand;Stand to Sit Sit to Stand: 4: Min guard;From bed Stand to Sit: 4: Min guard;To chair/3-in-1;With armrests Details for Transfer Assistance: VCs for hand placement Ambulation/Gait Ambulation/Gait Assistance: 4: Min guard;4: Min assist Ambulation Distance (Feet): 6 Feet Assistive device: Rolling walker Ambulation/Gait Assistance Details: Patient presented with dizziness, tinnitus, and diaphoresis upon inital ambulation Gait Pattern: Step-to  pattern;Decreased stride length;Right circumduction;Antalgic Gait velocity: decreaed    Exercises     PT Diagnosis: Difficulty walking;Abnormality of gait;Generalized weakness;Acute pain  PT Problem List: Decreased strength;Decreased range of motion;Decreased activity tolerance;Decreased balance;Decreased mobility;Pain PT Treatment Interventions: DME instruction;Gait training;Functional mobility training;Therapeutic activities;Therapeutic exercise;Patient/family education   PT Goals Acute Rehab PT Goals PT Goal Formulation: With patient Time For Goal Achievement: 06/06/12 Potential to Achieve Goals: Good Pt will Sit at [facility] of Bed: with modified independence PT Goal: Sit at Edge Of Bed - Progress: Goal set today Pt will Ambulate: >150 feet;with modified independence;with rolling walker PT Goal: Ambulate - Progress: Goal set today Pt will Perform Home Exercise Program: Independently PT Goal: Perform Home Exercise Program - Progress: Goal set today  Visit Information  Last PT Received On: 06/08/12 Assistance Needed: +1    Subjective Data  Subjective: Slept well last night pain is not too bad Patient Stated Goal: to go home   Prior Functioning  Home Living Lives With: Spouse Available Help at Discharge: Family Type of Home: House Home Access: Level entry Home Layout: One level Bathroom Shower/Tub: Forensic scientist: Standard Home Adaptive Equipment: Walker - rolling;Bedside commode/3-in-1 Prior Function Level of Independence: Independent Able to Take Stairs?: Yes Driving: No Vocation: On disability Communication Communication: No difficulties Dominant Hand: Right    Cognition  Overall Cognitive Status: Appears within functional limits for tasks assessed/performed Arousal/Alertness: Awake/alert Orientation Level: Appears intact for tasks assessed;Oriented X4 / Intact Behavior During Session: [facility] for tasks performed    Extremity/Trunk Assessment  Right Upper Extremity Assessment RUE ROM/Strength/Tone: Within functional levels Left Upper Extremity Assessment LUE ROM/Strength/Tone: Within functional levels Right Lower Extremity Assessment RLE ROM/Strength/Tone: Deficits;Due to pain;Due to precautions Left Lower Extremity Assessment LLE ROM/Strength/Tone: [facility] for  tasks assessed Trunk Assessment Trunk Assessment: Normal   Balance    End of Session PT - End of Session Equipment Utilized During Treatment: Gait belt Activity Tolerance: Patient limited by fatigue;Patient limited by pain (Patient presented with ) Patient left: in chair;with call bell/phone within reach Nurse Communication: Mobility status;Other (comment) (c/o itching and symptomatic dizziness, diaphoresis with amb)  GP     Fabio Asa 06/08/2012, 9:46 AM Charlotte Crumb, PT DPT  [phone]

## 2012-06-08 NOTE — Progress Notes (Signed)
Utilization review completed. Sincere Berlanga, RN, BSN. 

## 2012-06-08 NOTE — Plan of Care (Signed)
Problem: Consults Goal: Diagnosis- Total Joint Replacement Primary Total Hip Left     

## 2012-06-08 NOTE — Progress Notes (Signed)
Physical Therapy Treatment Patient Details Name: Nicolas [name] MRN: [id number] DOB: [birth year] Today's Date: 06/08/2012 Time: 2841-3244 PT Time Calculation (min): 24 min  PT Assessment / Plan / Recommendation Comments on Treatment Session  Pt tolerated treatment well and was able to ambulate increased distance without dizziness or tinnitus.  Will continue to progress activity and work on ther-ex as tolerated.     Follow Up Recommendations  Home health PT    Barriers to Discharge        Equipment Recommendations  Tub/shower seat    Recommendations for Other Services    Frequency 7X/week   Plan Discharge plan remains appropriate    Precautions / Restrictions Precautions Precautions: Posterior Hip Precaution Booklet Issued: Yes (comment) Precaution Comments: able to recall 3/3 precautions Restrictions Weight Bearing Restrictions: Yes Other Position/Activity Restrictions: WBAt   Pertinent Vitals/Pain 7/10    Mobility  Transfers Transfers: Sit to Stand;Stand to Sit Sit to Stand: 4: Min guard;From chair/3-in-1;With armrests Stand to Sit: 4: Min guard;To chair/3-in-1;With armrests Details for Transfer Assistance: VCs for hand placement Ambulation/Gait Ambulation/Gait Assistance: 4: Min guard;4: Min assist Ambulation Distance (Feet): 300 Feet Assistive device: Rolling walker Ambulation/Gait Assistance Details: Patient ambulated 250 ft then rest break and 50 additional ft; patient worked on left turns with hip precautions Gait Pattern: Step-to pattern;Decreased stride length;Right circumduction;Antalgic Gait velocity: decreaed General Gait Details: VC's for proper technique and sequencing for turns      PT Goals Acute Rehab PT Goals PT Goal Formulation: With patient Time For Goal Achievement: 06/06/12 Potential to Achieve Goals: Good Pt will Sit at [facility] of Bed: with modified independence PT Goal: Sit at Edge Of Bed - Progress: Progressing toward goal Pt will Ambulate:  >150 feet;with modified independence;with rolling walker PT Goal: Ambulate - Progress: Progressing toward goal Pt will Perform Home Exercise Program: Independently  Visit Information  Last PT Received On: 06/08/12 Assistance Needed: +1    Subjective Data  Subjective: I'm feeling a little pain in my right shoulder Patient Stated Goal: to go home   Cognition  Overall Cognitive Status: Appears within functional limits for tasks assessed/performed Arousal/Alertness: Awake/alert Orientation Level: Appears intact for tasks assessed;Oriented X4 / Intact Behavior During Session: [facility] for tasks performed       End of Session PT - End of Session Equipment Utilized During Treatment: Gait belt Activity Tolerance: Patient tolerated treatment well;Patient limited by pain Patient left: in chair;with call bell/phone within reach;with family/visitor present Nurse Communication: Mobility status   GP     Fabio Asa 06/08/2012, 3:13 PM Charlotte Crumb, PT DPT  [phone]

## 2012-06-09 ENCOUNTER — Inpatient Hospital Stay (HOSPITAL_COMMUNITY): Payer: Managed Care, Other (non HMO)

## 2012-06-09 LAB — BASIC METABOLIC PANEL
BUN: 20 mg/dL (ref 6–23)
CO2: 30 mEq/L (ref 19–32)
Calcium: 8.5 mg/dL (ref 8.4–10.5)
Chloride: 102 mEq/L (ref 96–112)
Creatinine, Ser: 1.73 mg/dL — ABNORMAL HIGH (ref 0.50–1.35)
GFR calc Af Amer: 56 mL/min — ABNORMAL LOW (ref >90)
GFR calc non Af Amer: 48 mL/min — ABNORMAL LOW (ref >90)
Glucose, Bld: 148 mg/dL — ABNORMAL HIGH (ref 70–99)
Potassium: 3.7 mEq/L (ref 3.5–5.1)
Sodium: 139 mEq/L (ref 135–145)

## 2012-06-09 LAB — CBC
HCT: 20.9 % — ABNORMAL LOW (ref 39.0–52.0)
Hemoglobin: 7.2 g/dL — ABNORMAL LOW (ref 13.0–17.0)
MCH: 25.5 pg — ABNORMAL LOW (ref 26.0–34.0)
MCHC: 34.4 g/dL (ref 30.0–36.0)
MCV: 74.1 fL — ABNORMAL LOW (ref 78.0–100.0)
Platelets: 203 10*3/uL (ref 150–400)
RBC: 2.82 MIL/uL — ABNORMAL LOW (ref 4.22–5.81)
RDW: 20.9 % — ABNORMAL HIGH (ref 11.5–15.5)
WBC: 14.2 10*3/uL — ABNORMAL HIGH (ref 4.0–10.5)

## 2012-06-09 LAB — CBC WITH DIFFERENTIAL/PLATELET
Basophils Absolute: 0 10*3/uL (ref 0.0–0.1)
Basophils Relative: 0 % (ref 0–1)
Eosinophils Absolute: 0.2 10*3/uL (ref 0.0–0.7)
Eosinophils Relative: 1 % (ref 0–5)
HCT: 20.9 % — ABNORMAL LOW (ref 39.0–52.0)
Hemoglobin: 7.4 g/dL — ABNORMAL LOW (ref 13.0–17.0)
Lymphocytes Relative: 14 % (ref 12–46)
Lymphs Abs: 2.2 10*3/uL (ref 0.7–4.0)
MCH: 26.6 pg (ref 26.0–34.0)
MCHC: 35.4 g/dL (ref 30.0–36.0)
MCV: 75.2 fL — ABNORMAL LOW (ref 78.0–100.0)
Monocytes Absolute: 1.7 10*3/uL — ABNORMAL HIGH (ref 0.1–1.0)
Monocytes Relative: 11 % (ref 3–12)
Neutro Abs: 11.5 10*3/uL — ABNORMAL HIGH (ref 1.7–7.7)
Neutrophils Relative %: 74 % (ref 43–77)
Platelets: 212 10*3/uL (ref 150–400)
RBC: 2.78 MIL/uL — ABNORMAL LOW (ref 4.22–5.81)
RDW: 21.5 % — ABNORMAL HIGH (ref 11.5–15.5)
WBC: 15.6 10*3/uL — ABNORMAL HIGH (ref 4.0–10.5)

## 2012-06-09 LAB — PROTIME-INR
INR: 1.36 (ref 0.00–1.49)
Prothrombin Time: 17 seconds — ABNORMAL HIGH (ref 11.6–15.2)

## 2012-06-09 MED ORDER — WARFARIN SODIUM 10 MG PO TABS
10.0000 mg | ORAL_TABLET | Freq: Once | ORAL | Status: AC
  Administered 2012-06-09: 10 mg via ORAL
  Filled 2012-06-09 (×2): qty 1

## 2012-06-09 NOTE — Progress Notes (Signed)
Physical Therapy Treatment Patient Details Name: Nicolas [name] MRN: [id number] DOB: Jan 08, 1976 Today's Date: 06/09/2012 Time: 4098-1191 PT Time Calculation (min): 12 min  PT Assessment / Plan / Recommendation Comments on Treatment Session  Moves well but cont's to have SOB with activity.  Pt very motivated    Follow Up Recommendations  Home health PT    Barriers to Discharge        Equipment Recommendations  Tub/shower bench    Recommendations for Other Services    Frequency 7X/week   Plan Discharge plan remains appropriate    Precautions / Restrictions Precautions Precautions: Fall;Posterior Hip Precaution Comments: able to recall 3/3 precautions Restrictions LLE Weight Bearing: Weight bearing as tolerated Other Position/Activity Restrictions: WBAt    Pertinent Vitals/Pain 5/10 Lt hip.  Premedicated per pt.     Mobility  Transfers Transfers: Sit to Stand;Stand to Sit Sit to Stand: 5: Supervision;With upper extremity assist;With armrests;From chair/3-in-1 Stand to Sit: 5: Supervision;With upper extremity assist;With armrests;To chair/3-in-1 Details for Transfer Assistance: supervision for safety Ambulation/Gait Ambulation/Gait Assistance: 4: Min guard Ambulation Distance (Feet): 200 Feet Assistive device: Rolling walker Ambulation/Gait Assistance Details: No seated rest break needed.  Cues to relax UE's & to increase weight bearing through LLE.   Gait Pattern: Step-through pattern;Decreased stance time - left;Antalgic Stairs: No Wheelchair Mobility Wheelchair Mobility: No    Exercises Total Joint Exercises Long Arc Quad: AROM;Both;10 reps Marching in Standing: AAROM;Left;10 reps;Seated;Standing      PT Goals Acute Rehab PT Goals PT Goal Formulation: With patient Time For Goal Achievement: 06/06/12 Potential to Achieve Goals: Good PT Goal: Ambulate - Progress: Progressing toward goal PT Goal: Perform Home Exercise Program - Progress: Progressing toward  goal  Visit Information  Last PT Received On: 06/09/12 Assistance Needed: +1    Subjective Data      Cognition  Overall Cognitive Status: Appears within functional limits for tasks assessed/performed Arousal/Alertness: Awake/alert Orientation Level: Appears intact for tasks assessed Behavior During Session: [facility] for tasks performed    Balance  Balance Balance Assessed: No  End of Session PT - End of Session Equipment Utilized During Treatment: Gait belt Activity Tolerance: Patient tolerated treatment well Patient left: in chair;with call bell/phone within reach     [place], [place] [phone] 06/09/2012

## 2012-06-09 NOTE — Progress Notes (Signed)
Physical Therapy Treatment Patient Details Name: Nicolas [name] MRN: [id number] DOB: 02/16/1976 Today's Date: 06/09/2012 Time: 4098-1191 PT Time Calculation (min): 24 min  PT Assessment / Plan / Recommendation Comments on Treatment Session  Pt fatigued quickly today.  SOB noted with activity & pt reports mild dizziness with ambulation.  RN aware.  Pt with low Hgb.  BP was taken when returned to room.  132/63.      Follow Up Recommendations  Home health PT    Barriers to Discharge        Equipment Recommendations  Tub/shower seat    Recommendations for Other Services    Frequency 7X/week   Plan Discharge plan remains appropriate    Precautions / Restrictions Precautions Precautions: Posterior Hip Precaution Comments: able to recall 3/3 precautions Restrictions Other Position/Activity Restrictions: WBAt    Pertinent Vitals/Pain No pain reported.  SOB noted with activity & pt reports mild dizziness with ambulation.  BP 132/63 when returned back to room.      Mobility  Bed Mobility Bed Mobility: Supine to Sit;Sitting - Scoot to Edge of Bed Supine to Sit: 4: Min assist Sitting - Scoot to [place] of Bed: 4: Min assist Details for Bed Mobility Assistance: transition appeared to be effortful & SOB noted.  Assist for LLE & to lift shoulders/trunk to sitting upright.  Cues for sequencing, UE positioning & use, & technique.  Transfers Transfers: Sit to Stand;Stand to Sit Sit to Stand: 4: Min guard;With upper extremity assist;From bed;From chair/3-in-1 Stand to Sit: 4: Min guard;With upper extremity assist;With armrests;To chair/3-in-1 Details for Transfer Assistance: cues for hand placement & technique Ambulation/Gait Ambulation/Gait Assistance: 4: Min guard Ambulation Distance (Feet): 200 Feet (100' + 100') Assistive device: Rolling walker Ambulation/Gait Assistance Details: Seated rest break at 1/2 way point.  Reports mild dizziness & SOB noted.  Encouragement to decrease reliance of  UE's on RW & increase weight shifting to Lt during stance phase.   Gait Pattern: Step-through pattern;Decreased stride length;Decreased step length - right;Decreased stance time - left;Decreased weight shift to left;Decreased hip/knee flexion - left;Antalgic Stairs: No Wheelchair Mobility Wheelchair Mobility: No    Exercises Total Joint Exercises Ankle Circles/Pumps: AROM;Both;15 reps Gluteal Sets: AROM;Both;10 reps Heel Slides: AAROM;Left;10 reps Hip ABduction/ADduction: AAROM;Left;10 reps    PT Goals Acute Rehab PT Goals Time For Goal Achievement: 06/06/12 Potential to Achieve Goals: Good PT Goal: Ambulate - Progress: Progressing toward goal PT Goal: Perform Home Exercise Program - Progress: Progressing toward goal  Visit Information  Last PT Received On: 06/09/12 Assistance Needed: +1    Subjective Data  Patient Stated Goal: to go home   Cognition  Overall Cognitive Status: Appears within functional limits for tasks assessed/performed Arousal/Alertness: Awake/alert Orientation Level: Appears intact for tasks assessed;Oriented X4 / Intact Behavior During Session: [facility] for tasks performed    Balance     End of Session PT - End of Session Equipment Utilized During Treatment: Gait belt Activity Tolerance: Patient tolerated treatment well;Patient limited by fatigue Patient left: in chair;with call bell/phone within reach;with nursing in room Nurse Communication: Mobility status    Verdell Face, [place] [phone] 06/09/2012

## 2012-06-09 NOTE — Evaluation (Signed)
Occupational Therapy Evaluation Patient Details Name: Nicolas [name] MRN: [id number] DOB: 1976/04/19 Today's Date: 06/09/2012 Time: 0865-7846 OT Time Calculation (min): 11 min  OT Assessment / Plan / Recommendation Clinical Impression  This 36 yo male s/p LTHA presents to acute OT with problems below. Will benefit from acute OT without need for follow-up.    OT Assessment  Patient needs continued OT Services    Follow Up Recommendations  No OT follow up    Barriers to Discharge None    Equipment Recommendations  Tub/shower bench    Recommendations for Other Services    Frequency  Min 2X/week    Precautions / Restrictions Precautions Precautions: Fall;Posterior Hip Restrictions LLE Weight Bearing: Weight bearing as tolerated       ADL  ADL Comments: Pt reports that has someone to A him prn at home but he does have the AE from his last hip, but does not have anything for his tub. Pt reports that he remembers how to use the AE. Pt does need to practice tub transfer.    OT Diagnosis: Generalized weakness  OT Problem List: Decreased knowledge of use of DME or AE OT Treatment Interventions: Self-care/ADL training;Patient/family education;DME and/or AE instruction   OT Goals Acute Rehab OT Goals OT Goal Formulation: With patient Time For Goal Achievement: 06/16/12 Potential to Achieve Goals: Good ADL Goals Pt Will Perform Tub/Shower Transfer: with min assist;Ambulation;with DME;Transfer tub bench;Maintaining hip precautions ADL Goal: Tub/Shower Transfer - Progress: Goal set today Miscellaneous OT Goals Miscellaneous OT Goal #1: Pt will be able to demonstrate that he remembers how to use AE for LB dressing without setup OT Goal: Miscellaneous Goal #1 - Progress: Goal set today  Visit Information  Last OT Received On: 06/09/12 Assistance Needed: +1    Subjective Data  Subjective: I have all the AE and DME from my last hip, except for something for my tub Patient Stated  Goal: Did not ask   Prior Functioning  Vision/Perception  Home Living Lives With: Spouse Available Help at Discharge: Family Type of Home: House Home Access: Level entry Home Layout: One level Bathroom Shower/Tub: Forensic scientist: Standard Bathroom Accessibility: Yes How Accessible: Accessible via walker Home Adaptive Equipment: Walker - rolling;Bedside commode/3-in-1;Reacher;Long-handled sponge;Long-handled shoehorn;Sock aid Prior Function Level of Independence: Independent Able to Take Stairs?: Yes Driving: No Vocation: On disability Communication Communication: No difficulties Dominant Hand: Right      Cognition  Overall Cognitive Status: Appears within functional limits for tasks assessed/performed Arousal/Alertness: Awake/alert Orientation Level: Appears intact for tasks assessed Behavior During Session: [facility] for tasks performed    Extremity/Trunk Assessment Right Upper Extremity Assessment RUE ROM/Strength/Tone: Within functional levels Left Upper Extremity Assessment LUE ROM/Strength/Tone: Within functional levels            End of Session OT - End of Session Activity Tolerance: Patient tolerated treatment well Patient left: in chair;with call bell/phone within reach       Evette Georges [phone] 06/09/2012, 2:17 PM

## 2012-06-09 NOTE — Progress Notes (Signed)
Patient ID: Nicolas [name], male   DOB: 1975/10/11, 36 y.o.   MRN: [id number]     Subjective:  Patient reports pain as mild.  States that he is doing much better but is a little sore today.  Complains of shoulder pain.    Objective:   VITALS:   Filed Vitals:   06/09/12 0345 06/09/12 0356 06/09/12 0400 06/09/12 0700  BP: 136/58     Pulse: 100 100  99  Temp: 99.8 F (37.7 C) 101 F (38.3 C)  100 F (37.8 C)  TempSrc: Oral Oral  Oral  Resp: 16  16 20   SpO2: 100%  100% 100%    ABD soft Sensation intact distally Dorsiflexion/Plantar flexion intact Incision: dressing C/D/I and scant drainage  LABS  Results for orders placed during the hospital encounter of 06/07/12 (from the past 24 hour(s))  PROTIME-INR     Status: Abnormal   Collection Time   06/09/12  6:10 AM      Component Value Range   Prothrombin Time 17.0 (*) 11.6 - 15.2 seconds   INR 1.36  0.00 - 1.49  CBC     Status: Abnormal   Collection Time   06/09/12  6:10 AM      Component Value Range   WBC 14.2 (*) 4.0 - 10.5 K/uL   RBC 2.82 (*) 4.22 - 5.81 MIL/uL   Hemoglobin 7.2 (*) 13.0 - 17.0 g/dL   HCT 40.9 (*) 81.1 - 91.4 %   MCV 74.1 (*) 78.0 - 100.0 fL   MCH 25.5 (*) 26.0 - 34.0 pg   MCHC 34.4  30.0 - 36.0 g/dL   RDW 78.2 (*) 95.6 - 21.3 %   Platelets 203  150 - 400 K/uL  BASIC METABOLIC PANEL     Status: Abnormal   Collection Time   06/09/12  6:10 AM      Component Value Range   Sodium 139  135 - 145 mEq/L   Potassium 3.7  3.5 - 5.1 mEq/L   Chloride 102  96 - 112 mEq/L   CO2 30  19 - 32 mEq/L   Glucose, Bld 148 (*) 70 - 99 mg/dL   BUN 20  6 - 23 mg/dL   Creatinine, Ser 0.86 (*) 0.50 - 1.35 mg/dL   Calcium 8.5  8.4 - 57.8 mg/dL   GFR calc non Af Amer 48 (*) >90 mL/min   GFR calc Af Amer 56 (*) >90 mL/min    Dg Pelvis Portable  06/07/2012  *RADIOLOGY REPORT*  Clinical Data: Left hip replacement.  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  Pelvic ring is intact. Nonspecific bowel gas pattern.  Left hip appears to be grossly located on the single view.  IMPRESSION: Placement of left hip arthroplasty.  No gross abnormality.   Original Report Authenticated By: Richarda Overlie, M.D.    Dg Hip Portable 1 View Left  06/07/2012  *RADIOLOGY REPORT*  Clinical Data: Left hip replacement.  PORTABLE LEFT HIP - 1 VIEW  Comparison: 06/07/2012  Findings: Two views of the left hip demonstrate a left hip arthroplasty.  The left hip appears to be located.  No evidence for a periprosthetic fracture.  The entire femoral stem is visualized.  IMPRESSION: Left hip arthroplasty without complicating features.   Original Report Authenticated By: Richarda Overlie, M.D.     Assessment/Plan: 2 Days Post-Op   Principal Problem:  *Osteoarthritis of  left hip Active Problems:  Postoperative anemia due to acute blood loss  Low grade fever:  Observe and encourage  IS. ABLA, monitor for symptoms, consider transfusion if symptoms develop.  Advance diet Up with therapy Plan for Dressing change tomorrow Plan for DC home tomorrow or friday   Kristia Jupiter P 06/09/2012, 9:17 AM   [name], MD Cell [phone] Pager [phone]

## 2012-06-09 NOTE — Progress Notes (Signed)
Notified Dr Dion Saucier of pts hemoglobin of 7.2. No order to transfuse at this time. Will continue to monitor pt.

## 2012-06-09 NOTE — Progress Notes (Signed)
ANTICOAGULATION CONSULT NOTE   Pharmacy Consult for Coumadin Indication: VTE prophylaxis s/p left THA  No Known Allergies  Patient Measurements: Height: 60" Weight: 115.3 kg  Vital Signs: Temp: 100 F (37.8 C) (09/11 0700) Temp src: Oral (09/11 0700) BP: 136/58 mmHg (09/11 0345) Pulse Rate: 99  (09/11 0700)  Labs (05/27/12):  Basename 06/09/12 0610 06/08/12 0558  HGB 7.2* 8.0*  HCT 20.9* 23.0*  PLT 203 213  APTT -- --  LABPROT 17.0* 15.0  INR 1.36 1.16  HEPARINUNFRC -- --  CREATININE 1.73* 1.30  CKTOTAL -- --  CKMB -- --  TROPONINI -- --    CrCl is unknown because there is no height on file for the current visit. PT 12.7 INR 0.93 SCr 1.04 H/H/platelet 13.1/36.4/314  Medical History: Past Medical History  Diagnosis Date  . Hypertension   . Arthritis     arthritis- back & L hip  . Depression     situational depression, pt. out of work   . Osteoarthritis of left hip 06/07/2012    Assessment: 36 yo male to begin Coumadin for VTE prophylaxis s/p left THA. INR slightly up = 1.36, hgb = 7.2, plt = 203 ( also on Lovenox sq)  Goal of Therapy:  INR 2-3   Plan:  -Coumadin 10 mg po tonight -INR daily -D/C lovenox when INR > 2 -Watch Hg trend  Harland German, Pharm D 06/09/2012 8:32 AM

## 2012-06-10 LAB — CBC
HCT: 19.3 % — ABNORMAL LOW (ref 39.0–52.0)
Hemoglobin: 6.7 g/dL — CL (ref 13.0–17.0)
MCH: 25.7 pg — ABNORMAL LOW (ref 26.0–34.0)
MCHC: 34.7 g/dL (ref 30.0–36.0)
MCV: 73.9 fL — ABNORMAL LOW (ref 78.0–100.0)
Platelets: 212 10*3/uL (ref 150–400)
RBC: 2.61 MIL/uL — ABNORMAL LOW (ref 4.22–5.81)
RDW: 21.4 % — ABNORMAL HIGH (ref 11.5–15.5)
WBC: 14.4 10*3/uL — ABNORMAL HIGH (ref 4.0–10.5)

## 2012-06-10 LAB — PREPARE RBC (CROSSMATCH)

## 2012-06-10 LAB — PROTIME-INR
INR: 1.48 (ref 0.00–1.49)
Prothrombin Time: 18.2 seconds — ABNORMAL HIGH (ref 11.6–15.2)

## 2012-06-10 MED ORDER — ACETAMINOPHEN 325 MG PO TABS
650.0000 mg | ORAL_TABLET | Freq: Once | ORAL | Status: AC
  Administered 2012-06-10: 650 mg via ORAL
  Filled 2012-06-10: qty 2

## 2012-06-10 MED ORDER — DIPHENHYDRAMINE HCL 25 MG PO CAPS
25.0000 mg | ORAL_CAPSULE | Freq: Once | ORAL | Status: AC
  Administered 2012-06-10: 25 mg via ORAL
  Filled 2012-06-10: qty 1

## 2012-06-10 NOTE — Progress Notes (Addendum)
Critical hemoglobin of 6.7 called in from the lab this am.  Janace Litten, PA aware, he will discuss with Dr. Dion Saucier.  No new orders at this time.

## 2012-06-10 NOTE — Progress Notes (Signed)
During 1st hour of 2nd PRBC infusion, pt's  temp. spiked to  100.5  orally,MD  Made aware. Infusion continued and temp. Monitored. At 1850 pt 's temp. 102.5  with no noted distress, blood transfusion stopped, MD made aware, blood bank notified and steps taken for transfusion reaction.

## 2012-06-10 NOTE — Progress Notes (Signed)
Subjective:  Patient reports pain as moderate.  He has been able to ambulate with physical therapy, but has had recurring fevers. He has been working on incentive spirometry. He complains of some pain in his hip.  Objective:   VITALS:   Filed Vitals:   06/10/12 0505 06/10/12 0720 06/10/12 1122 06/10/12 1211  BP: 138/66  154/74 140/81  Pulse: 118  114 113  Temp: 100.8 F (38.2 C) 98.3 F (36.8 C) 101.5 F (38.6 C) 100.7 F (38.2 C)  TempSrc:      Resp: 18  20 20   SpO2: 97%  96% 98%    Neurologically intact Dorsiflexion/Plantar flexion intact Incision: dressing C/D/I He has a fairly large hematoma around his left thigh. There is no significant drainage through the dressing.  LABS  Results for orders placed during the hospital encounter of 06/07/12 (from the past 24 hour(s))  CBC WITH DIFFERENTIAL     Status: Abnormal   Collection Time   06/09/12 10:07 PM      Component Value Range   WBC 15.6 (*) 4.0 - 10.5 K/uL   RBC 2.78 (*) 4.22 - 5.81 MIL/uL   Hemoglobin 7.4 (*) 13.0 - 17.0 g/dL   HCT 16.1 (*) 09.6 - 04.5 %   MCV 75.2 (*) 78.0 - 100.0 fL   MCH 26.6  26.0 - 34.0 pg   MCHC 35.4  30.0 - 36.0 g/dL   RDW 40.9 (*) 81.1 - 91.4 %   Platelets 212  150 - 400 K/uL   Neutrophils Relative 74  43 - 77 %   Lymphocytes Relative 14  12 - 46 %   Monocytes Relative 11  3 - 12 %   Eosinophils Relative 1  0 - 5 %   Basophils Relative 0  0 - 1 %   Neutro Abs 11.5 (*) 1.7 - 7.7 K/uL   Lymphs Abs 2.2  0.7 - 4.0 K/uL   Monocytes Absolute 1.7 (*) 0.1 - 1.0 K/uL   Eosinophils Absolute 0.2  0.0 - 0.7 K/uL   Basophils Absolute 0.0  0.0 - 0.1 K/uL   RBC Morphology POLYCHROMASIA PRESENT     Smear Review LARGE PLATELETS PRESENT    PROTIME-INR     Status: Abnormal   Collection Time   06/10/12  6:01 AM      Component Value Range   Prothrombin Time 18.2 (*) 11.6 - 15.2 seconds   INR 1.48  0.00 - 1.49  CBC     Status: Abnormal   Collection Time   06/10/12  6:01 AM      Component  Value Range   WBC 14.4 (*) 4.0 - 10.5 K/uL   RBC 2.61 (*) 4.22 - 5.81 MIL/uL   Hemoglobin 6.7 (*) 13.0 - 17.0 g/dL   HCT 78.2 (*) 95.6 - 21.3 %   MCV 73.9 (*) 78.0 - 100.0 fL   MCH 25.7 (*) 26.0 - 34.0 pg   MCHC 34.7  30.0 - 36.0 g/dL   RDW 08.6 (*) 57.8 - 46.9 %   Platelets 212  150 - 400 K/uL  PREPARE RBC (CROSSMATCH)     Status: Normal   Collection Time   06/10/12 10:15 AM      Component Value Range   Order Confirmation ORDER PROCESSED BY BLOOD BANK      Dg Chest 2 View  06/09/2012  *RADIOLOGY REPORT*  Clinical Data: Fever.  Chest pain.  Shortness of breath.  CHEST - 2 VIEW  Comparison: 05/27/2012  Findings: Shallow inspiration.  Heart size and pulmonary vascularity are prominent but likely normal for technique. Consolidation.  Vascular crowding.  No blunting of costophrenic sclerosis in the humeral heads.  Stable appearance since the previous study, allowing for shallower inspiration.  IMPRESSION: Shallow inspiration.  No evidence of any active pulmonary disease.   Original Report Authenticated By: Marlon Pel, M.D.     Assessment/Plan: 3 Days Post-Op   Principal Problem:  *Osteoarthritis of left hip Active Problems:  Postoperative anemia due to acute blood loss  His hemoglobin has continued to drop, and given a hemoglobin below 7, I recommended a blood transfusion. He does have some symptoms, with some lightheadedness, and weakness with therapy. I suspect that he has a fairly large hematoma in his thigh, which could increase his temperatures as well. If his temperature remained elevated and we may give consideration to a urinalysis and a chest x-ray, although in the meantime he has had his Foley removed, and is working on his incentive spirometry.  Advance diet Up with therapy Discharge home with home health Possible discharge date my be tomorrow, if his temperatures resolve, and he is able to ambulate well and his blood counts improve.  Jewel Venditto P 06/10/2012, 2:01  PM   [name], MD Cell [phone] Pager [phone]

## 2012-06-10 NOTE — Progress Notes (Signed)
Patient with temp of 102.8 overnight.  Spoke with Dr. Alveda Reasons, CBC with diff, blood cultures, and CXR all ordered STAT.  Patient down for CXR, results show no evidence of active disease .  Hemoglobin 7.4, WBC 15.6.  Blood cultures remain pending.  No further orders from Dr. Alveda Reasons at this time.  Temp rechecked, 98.8 after Tylenol administered.

## 2012-06-10 NOTE — Progress Notes (Signed)
OT Cancellation Note  Treatment cancelled today due to medical issues with patient which prohibited therapy: pt with 6.7hgb and 100.7 temperature. RN asking therapy to hold at this time. Will check back as schedule allows. Thank you. [name], OTR/L Pager: [phone] 06/10/2012    Sandor Arboleda 06/10/2012, 1:51 PM

## 2012-06-10 NOTE — Progress Notes (Signed)
PT Cancellation Note  Treatment cancelled today due to medical issues with patient which prohibited therapy; HgB 6.7.  Fabio Asa 06/10/2012, 10:14 AM

## 2012-06-10 NOTE — Progress Notes (Signed)
ANTICOAGULATION CONSULT NOTE   Pharmacy Consult for Coumadin Indication: VTE prophylaxis s/p left THA  No Known Allergies  Patient Measurements: Height: 60" Weight: 115.3 kg  Vital Signs: Temp: 99.4 F (37.4 C) (09/12 1500) BP: 144/72 mmHg (09/12 1500) Pulse Rate: 110  (09/12 1500)  Labs (05/27/12):  Basename 06/10/12 0601 06/09/12 2207 06/09/12 0610 06/08/12 0558  HGB 6.7* 7.4* -- --  HCT 19.3* 20.9* 20.9* --  PLT 212 212 203 --  APTT -- -- -- --  LABPROT 18.2* -- 17.0* 15.0  INR 1.48 -- 1.36 1.16  HEPARINUNFRC -- -- -- --  CREATININE -- -- 1.73* 1.30  CKTOTAL -- -- -- --  CKMB -- -- -- --  TROPONINI -- -- -- --    CrCl is unknown because there is no height on file for the current visit. PT 12.7 INR 0.93 SCr 1.04 H/H/platelet 13.1/36.4/314  Medical History: Past Medical History  Diagnosis Date  . Hypertension   . Arthritis     arthritis- back & L hip  . Depression     situational depression, pt. out of work   . Osteoarthritis of left hip 06/07/2012    Assessment: 36 yo male to begin Coumadin for VTE prophylaxis s/p left THA. INR slightly up = 1.48, hgb =6.7, plt = 212 ( also on Lovenox sq).  Some concern for thigh hematoma  Goal of Therapy:  INR 2-3   Plan:  -Hold coumadin for now -INR daily -D/C lovenox when INR > 2 -Watch Hg trend  Harland German, Pharm D 06/10/2012 3:52 PM

## 2012-06-11 ENCOUNTER — Ambulatory Visit (HOSPITAL_COMMUNITY)
Admission: RE | Admit: 2012-06-11 | Payer: Managed Care, Other (non HMO) | Source: Ambulatory Visit | Admitting: Orthopedic Surgery

## 2012-06-11 LAB — URINALYSIS, MICROSCOPIC ONLY
Bilirubin Urine: NEGATIVE
Glucose, UA: NEGATIVE mg/dL
Ketones, ur: NEGATIVE mg/dL
Leukocytes, UA: NEGATIVE
Nitrite: NEGATIVE
Protein, ur: 100 mg/dL — AB
Specific Gravity, Urine: 1.009 (ref 1.005–1.030)
Urobilinogen, UA: 1 mg/dL (ref 0.0–1.0)
pH: 7 (ref 5.0–8.0)

## 2012-06-11 LAB — TYPE AND SCREEN
ABO/RH(D): O NEG
Antibody Screen: NEGATIVE
Unit division: 0
Unit division: 0

## 2012-06-11 LAB — PROTIME-INR
INR: 1.32 (ref 0.00–1.49)
Prothrombin Time: 16.6 seconds — ABNORMAL HIGH (ref 11.6–15.2)

## 2012-06-11 LAB — CBC
HCT: 24.2 % — ABNORMAL LOW (ref 39.0–52.0)
Hemoglobin: 8.5 g/dL — ABNORMAL LOW (ref 13.0–17.0)
MCH: 26.6 pg (ref 26.0–34.0)
MCHC: 35.1 g/dL (ref 30.0–36.0)
MCV: 75.6 fL — ABNORMAL LOW (ref 78.0–100.0)
Platelets: 284 10*3/uL (ref 150–400)
RBC: 3.2 MIL/uL — ABNORMAL LOW (ref 4.22–5.81)
RDW: 21.6 % — ABNORMAL HIGH (ref 11.5–15.5)
WBC: 14.4 10*3/uL — ABNORMAL HIGH (ref 4.0–10.5)

## 2012-06-11 LAB — TRANSFUSION REACTION
DAT C3: NEGATIVE
Post RXN DAT IgG: NEGATIVE

## 2012-06-11 MED ORDER — WARFARIN SODIUM 2.5 MG PO TABS
12.5000 mg | ORAL_TABLET | Freq: Once | ORAL | Status: DC
  Filled 2012-06-11: qty 1

## 2012-06-11 NOTE — Progress Notes (Signed)
Physical Therapy Treatment Patient Details Name: Nicolas [name] MRN: [id number] DOB: January 18, 1976 Today's Date: 06/11/2012 Time:  -     PT Assessment / Plan / Recommendation Comments on Treatment Session  Pt tolerated treatment well, was able to ambulate to and from ortho gym.  Performed education on car transfers and safety with community ambulation.  Spouse and pt verbalized understanding.  Reviewed questions and ensured understanding of safe techniques for discharge.      Follow Up Recommendations  Home health PT    Barriers to Discharge        Equipment Recommendations  None recommended by OT    Recommendations for Other Services    Frequency 7X/week   Plan Discharge plan remains appropriate    Precautions / Restrictions Precautions Precautions: Posterior Hip Precaution Comments: able to recall 3/3 precautions Restrictions LLE Weight Bearing: Weight bearing as tolerated   Pertinent Vitals/Pain 4/10    Mobility  Transfers Sit to Stand: 5: Supervision;With upper extremity assist;From chair/3-in-1 Stand to Sit: 5: Supervision;With upper extremity assist;To chair/3-in-1;To toilet Details for Transfer Assistance: vc to follow hip precautions during transitional movements Ambulation/Gait Ambulation/Gait Assistance: 5: Supervision Ambulation Distance (Feet): 600 Feet (300 x2) Assistive device: Rolling walker Gait Pattern: Step-through pattern;Decreased stance time - left;Antalgic Gait velocity: decreaed General Gait Details: Addressed turns to the left and right following precautions, Pt performed safely each direction x 3      PT Goals Acute Rehab PT Goals PT Goal: Ambulate - Progress: Progressing toward goal  Visit Information  Last PT Received On: 06/11/12 Assistance Needed: +1       Cognition  Overall Cognitive Status: Appears within functional limits for tasks assessed/performed Arousal/Alertness: Awake/alert Orientation Level: Appears intact for tasks  assessed Behavior During Session: [facility] for tasks performed    Balance     End of Session PT - End of Session Equipment Utilized During Treatment: Gait belt Activity Tolerance: Patient tolerated treatment well Patient left: in chair;with call bell/phone within reach;with family/visitor present Nurse Communication: Mobility status   GP     Fabio Asa 06/11/2012, 1:12 PM Charlotte Crumb, PT DPT  [phone]

## 2012-06-11 NOTE — Progress Notes (Signed)
Occupational Therapy Treatment Patient Details Name: Nicolas [name] MRN: [id number] DOB: 06-11-1976 Today's Date: 06/11/2012 Time: 4098-1191 OT Time Calculation (min): 30 min  OT Assessment / Plan / Recommendation Comments on Treatment Session Excellent progress. Completing mobility and ADL at level appropriate for D/C. Will continue with HH.    Follow Up Recommendations  No OT follow up    Barriers to Discharge   none    Equipment Recommendations  None recommended by OT    Recommendations for Other Services  none  Frequency   D/C  Plan Discharge plan remains appropriate    Precautions / Restrictions Precautions Precautions: Posterior Hip Precaution Comments: able to recall 3/3 precautions Restrictions LLE Weight Bearing: Weight bearing as tolerated   Pertinent Vitals/Pain 3    ADL  Upper Body Dressing: Performed;Supervision/safety Where Assessed - Upper Body Dressing: Unsupported sit to stand Lower Body Dressing: Performed;Supervision/safety Where Assessed - Lower Body Dressing: Unsupported sit to stand Toilet Transfer: Simulated;Supervision/safety Toilet Transfer Method: Sit to stand;Stand pivot Acupuncturist: Bedside commode Toileting - Clothing Manipulation and Hygiene: Performed;Modified independent Where Assessed - Toileting Clothing Manipulation and Hygiene: Standing Tub/Shower Transfer: Engineer, manufacturing Method: Science writer: Other (comment) (3 in 1) Equipment Used: Gait belt;Reacher;Rolling walker;Sock aid Transfers/Ambulation Related to ADLs: S ADL Comments: Pt has DME and AE    OT Diagnosis:    OT Problem List:   OT Treatment Interventions:     OT Goals Acute Rehab OT Goals OT Goal Formulation: With patient Time For Goal Achievement: 06/16/12 Potential to Achieve Goals: Good ADL Goals Pt Will Perform Tub/Shower Transfer: with min assist;Ambulation;with DME;Transfer tub  bench;Maintaining hip precautions ADL Goal: Tub/Shower Transfer - Progress: Met Miscellaneous OT Goals Miscellaneous OT Goal #1: Pt will be able to demonstrate that he remembers how to use AE for LB dressing without setup OT Goal: Miscellaneous Goal #1 - Progress: Met  Visit Information  Last OT Received On: 06/11/12    Subjective Data   I know how to do all of this   Prior Functioning       Cognition  Overall Cognitive Status: Appears within functional limits for tasks assessed/performed Arousal/Alertness: Awake/alert Orientation Level: Appears intact for tasks assessed Behavior During Session: [place] for tasks performed    Mobility  Shoulder Instructions Transfers Transfers: Sit to Stand;Stand to Sit Sit to Stand: 5: Supervision;With upper extremity assist;From chair/3-in-1 Stand to Sit: 5: Supervision;With upper extremity assist;To chair/3-in-1;To toilet Details for Transfer Assistance: vc to follow hip precautions during transitional movements       Exercises      Balance  WFL   End of Session OT - End of Session Equipment Utilized During Treatment: Gait belt Activity Tolerance: Patient tolerated treatment well Patient left: in chair;with call bell/phone within reach;with family/visitor present Nurse Communication: Other (comment) (Ok for D/C)  GO     Cristino Degroff,HILLARY 06/11/2012, 12:02 PM [facility], OTR/L  [phone] 06/11/2012

## 2012-06-11 NOTE — Progress Notes (Addendum)
Patient ID: Nicolas [name], male   DOB: 1975/10/26, 36 y.o.   MRN: [id number]     Subjective:  Patient reports pain as mild to moderate.  He states that he fills better today than the past two days.  Denies CP or SOB.  Objective:   VITALS:   Filed Vitals:   06/10/12 2256 06/11/12 0000 06/11/12 0400 06/11/12 0609  BP: 145/82   157/97  Pulse: 112   86  Temp: 101.8 F (38.8 C)   101 F (38.3 C)  TempSrc:      Resp: 18 16 16 18   SpO2: 100% 100% 99% 98%    ABD soft Sensation intact distally Dorsiflexion/Plantar flexion intact Incision: scant drainage Wound clean and dry on sign of infection.  LABS  Results for orders placed during the hospital encounter of 06/07/12 (from the past 24 hour(s))  TRANSFUSION REACTION     Status: Normal (Preliminary result)   Collection Time   06/10/12  8:30 PM      Component Value Range   Post RXN DAT IgG NEG     DAT C3 NEG     Path interp tx rxn PENDING    PROTIME-INR     Status: Abnormal   Collection Time   06/11/12  6:10 AM      Component Value Range   Prothrombin Time 16.6 (*) 11.6 - 15.2 seconds   INR 1.32  0.00 - 1.49  CBC     Status: Abnormal   Collection Time   06/11/12  6:10 AM      Component Value Range   WBC 14.4 (*) 4.0 - 10.5 K/uL   RBC 3.20 (*) 4.22 - 5.81 MIL/uL   Hemoglobin 8.5 (*) 13.0 - 17.0 g/dL   HCT 40.9 (*) 81.1 - 91.4 %   MCV 75.6 (*) 78.0 - 100.0 fL   MCH 26.6  26.0 - 34.0 pg   MCHC 35.1  30.0 - 36.0 g/dL   RDW 78.2 (*) 95.6 - 21.3 %   Platelets 284  150 - 400 K/uL    Dg Chest 2 View  06/09/2012  *RADIOLOGY REPORT*  Clinical Data: Fever.  Chest pain.  Shortness of breath.  CHEST - 2 VIEW  Comparison: 05/27/2012  Findings: Shallow inspiration.  Heart size and pulmonary vascularity are prominent but likely normal for technique. Consolidation.  Vascular crowding.  No blunting of costophrenic sclerosis in the humeral heads.  Stable appearance since the previous study, allowing for shallower inspiration.  IMPRESSION:  Shallow inspiration.  No evidence of any active pulmonary disease.   Original Report Authenticated By: Marlon Pel, M.D.     Assessment/Plan: 4 Days Post-Op   Principal Problem:  *Osteoarthritis of left hip Active Problems:  Postoperative anemia due to acute blood loss  H/H improved.   Ongoing fevers. Blood cx already ordered, will recheck cxr and ua given ongoing fevers..  Wound appears clean, but positive hematoma.  Advance diet Up with therapy Plan for discharge tomorrow if temp and H&H stabilize.   Keigo Whalley P 06/11/2012, 12:09 PM   [name], MD Cell [phone] Pager [phone]

## 2012-06-11 NOTE — Progress Notes (Signed)
ANTICOAGULATION CONSULT NOTE   Pharmacy Consult for Coumadin Indication: VTE prophylaxis s/p left THA  No Known Allergies  Patient Measurements: Height: 60" Weight: 115.3 kg  Vital Signs: Temp: 101 F (38.3 C) (09/13 0609) BP: 157/97 mmHg (09/13 0609) Pulse Rate: 86  (09/13 0609)  Labs (05/27/12):  Basename 06/11/12 0610 06/10/12 0601 06/09/12 2207 06/09/12 0610  HGB 8.5* 6.7* -- --  HCT 24.2* 19.3* 20.9* --  PLT 284 212 212 --  APTT -- -- -- --  LABPROT 16.6* 18.2* -- 17.0*  INR 1.32 1.48 -- 1.36  HEPARINUNFRC -- -- -- --  CREATININE -- -- -- 1.73*  CKTOTAL -- -- -- --  CKMB -- -- -- --  TROPONINI -- -- -- --    Assessment: 36 yo male to begin Coumadin for VTE prophylaxis s/p left THA. INR down since 9/12 and Hg=  6.7 now 8.5 s/p transfusion ( also on Lovenox sq).    Goal of Therapy:  INR 2-3   Plan:  -Increase Coumadin to 12.5mg  today  -INR daily -D/C lovenox when INR > 2 -Watch Hg trend  Harland German, Pharm D 06/11/2012 8:30 AM

## 2012-06-16 LAB — CULTURE, BLOOD (ROUTINE X 2)
Culture: NO GROWTH
Culture: NO GROWTH

## 2012-06-22 NOTE — Discharge Summary (Signed)
Physician Discharge Summary  Patient ID: Nicolas [name] MRN: [id number] DOB/AGE: October 04, 1975 36 y.o.  Admit date: 06/07/2012 Discharge date: 06/11/2012  Admission Diagnoses:  Osteoarthritis of left hip  Discharge Diagnoses:  Principal Problem:  *Osteoarthritis of left hip Active Problems:  Postoperative anemia due to acute blood loss   Past Medical History  Diagnosis Date  . Hypertension   . Arthritis     arthritis- back & L hip  . Depression     situational depression, pt. out of work   . Osteoarthritis of left hip 06/07/2012    Surgeries: Procedure(s): TOTAL HIP ARTHROPLASTY on 06/07/2012   Consultants (if any):    Discharged Condition: Improved  Hospital Course: Nicolas [name] is an 36 y.o. male who was admitted 06/07/2012 with a diagnosis of Osteoarthritis of left hip and went to the operating room on 06/07/2012 and underwent the above named procedures.    He was given perioperative antibiotics:  Anti-infectives     Start     Dose/Rate Route Frequency Ordered Stop   06/07/12 2000   ceFAZolin (ANCEF) IVPB 2 g/50 mL premix        2 g 100 mL/hr over 30 Minutes Intravenous Every 6 hours 06/07/12 1913 06/08/12 0251   06/06/12 1536   ceFAZolin (ANCEF) IVPB 2 g/50 mL premix        2 g 100 mL/hr over 30 Minutes Intravenous 60 min pre-op 06/06/12 1536 06/07/12 1317        .  He was given sequential compression devices, early ambulation, and lovenox bridging to coumadin for DVT prophylaxis.  He did have some early fevers postoperatively, as well a hematoma in his thigh, and ABLA.  He was given a blood transfusion.  His fever workup was negative including UA/CXR/Blood Cx.    He benefited maximally from the hospital stay and there were no complications.      Recent laboratory studies:  Lab Results  Component Value Date   HGB 8.5* 06/11/2012   HGB 6.7* 06/10/2012   HGB 7.4* 06/09/2012   Lab Results  Component Value Date   WBC 14.4* 06/11/2012   PLT 284 06/11/2012   Lab  Results  Component Value Date   INR 1.32 06/11/2012   Lab Results  Component Value Date   NA 139 06/09/2012   K 3.7 06/09/2012   CL 102 06/09/2012   CO2 30 06/09/2012   BUN 20 06/09/2012   CREATININE 1.73* 06/09/2012   GLUCOSE 148* 06/09/2012    Discharge Medications:     Medication List     As of 06/22/2012  7:33 AM    STOP taking these medications         meloxicam 15 MG tablet   Commonly known as: MOBIC      methocarbamol 500 MG tablet   Commonly known as: ROBAXIN      naproxen 500 MG tablet   Commonly known as: NAPROSYN      traMADol 50 MG tablet   Commonly known as: ULTRAM      TAKE these medications         AZOR 10-40 MG per tablet   Generic drug: amLODipine-olmesartan   Take 1 tablet by mouth daily.      multivitamin with minerals Tabs   Take 1 tablet by mouth daily.      promethazine 25 MG tablet   Commonly known as: PHENERGAN   Take 1 tablet (25 mg total) by mouth every 6 (six) hours as needed for nausea.  VITAMIN D3 SUPER STRENGTH 2000 UNITS Tabs   Generic drug: Cholecalciferol   Take 2,000 Units by mouth daily.      lovenox bridge, with coumadin script given Percocet script given  Diagnostic Studies: Dg Chest 2 View  06/09/2012  *RADIOLOGY REPORT*  Clinical Data: Fever.  Chest pain.  Shortness of breath.  CHEST - 2 VIEW  Comparison: 05/27/2012  Findings: Shallow inspiration.  Heart size and pulmonary vascularity are prominent but likely normal for technique. Consolidation.  Vascular crowding.  No blunting of costophrenic sclerosis in the humeral heads.  Stable appearance since the previous study, allowing for shallower inspiration.  IMPRESSION: Shallow inspiration.  No evidence of any active pulmonary disease.   Original Report Authenticated By: Marlon Pel, M.D.    Dg Chest 2 View  05/27/2012  *RADIOLOGY REPORT*  Clinical Data: Preop evaluation.  CHEST - 2 VIEW  Comparison: 11/27/2010  Findings: Two views of the chest demonstrate low lung  volumes. There is no focal airspace disease and no evidence for edema. Normal appearance of heart and mediastinum.  Trachea is midline. There are patchy lucencies throughout the proximal humeri bilaterally.  Similar findings were present on the prior examination.  IMPRESSION: No acute cardiopulmonary disease.  Abnormality of the proximal humeri bilaterally. This appears to be a chronic finding but can be better evaluated with dedicated humerus images if needed.   Original Report Authenticated By: Richarda Overlie, M.D.    Dg Pelvis Portable  06/07/2012  *RADIOLOGY REPORT*  Clinical Data: Left hip replacement.  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  Pelvic ring is intact. Nonspecific bowel gas pattern.  Left hip appears to be grossly located on the single view.  IMPRESSION: Placement of left hip arthroplasty.  No gross abnormality.   Original Report Authenticated By: Richarda Overlie, M.D.    Dg Hip Portable 1 View Left  06/07/2012  *RADIOLOGY REPORT*  Clinical Data: Left hip replacement.  PORTABLE LEFT HIP - 1 VIEW  Comparison: 06/07/2012  Findings: Two views of the left hip demonstrate a left hip arthroplasty.  The left hip appears to be located.  No evidence for a periprosthetic fracture.  The entire femoral stem is visualized.  IMPRESSION: Left hip arthroplasty without complicating features.   Original Report Authenticated By: Richarda Overlie, M.D.     Disposition: 01-Home or Self Care      Discharge Orders    Future Orders Please Complete By Expires   Diet general      Call MD / Call 911      Comments:   If you experience chest pain or shortness of breath, CALL 911 and be transported to the hospital emergency room.  If you develope a fever above 101 F, pus (white drainage) or increased drainage or redness at the wound, or calf pain, call your surgeon's office.   Discharge instructions       Comments:   Change dressing in 3 days and reapply fresh dressing, unless you have a splint (half cast).  If you have a splint/cast, just leave in place until your follow-up appointment.    Keep wounds dry for 3 weeks.  Leave steri-strips in place on skin.  Do not apply lotion or anything to the wound.   Constipation Prevention      Comments:   Drink plenty of fluids.  Prune juice may be helpful.  You may use a stool softener,  such as Colace (over the counter) 100 mg twice a day.  Use MiraLax (over the counter) for constipation as needed.   Follow the hip precautions as taught in Physical Therapy      Change dressing      Comments:   You may change your dressing in 3 days, then change the dressing daily with sterile 4 x 4 inch gauze dressing and paper tape.  You may clean the incision with alcohol prior to redressing   TED hose      Comments:   Use stockings (TED hose) for 2 weeks on both leg(s).  You may remove them at night for sleeping.      Follow-up Information    Follow up with Bynum Mccullars P, MD in 2 weeks.   Contact information:   [facility] [address] [place] [address] [phone]           Signed: Eulas Post 06/22/2012, 7:33 AM

## 2012-12-02 ENCOUNTER — Ambulatory Visit: Payer: Medicaid Other | Attending: Orthopedic Surgery | Admitting: Physical Therapy

## 2012-12-02 DIAGNOSIS — M545 Low back pain, unspecified: Secondary | ICD-10-CM | POA: Insufficient documentation

## 2012-12-02 DIAGNOSIS — IMO0001 Reserved for inherently not codable concepts without codable children: Secondary | ICD-10-CM | POA: Insufficient documentation

## 2012-12-02 DIAGNOSIS — M2560 Stiffness of unspecified joint, not elsewhere classified: Secondary | ICD-10-CM | POA: Insufficient documentation

## 2012-12-02 DIAGNOSIS — R293 Abnormal posture: Secondary | ICD-10-CM | POA: Insufficient documentation

## 2012-12-09 ENCOUNTER — Ambulatory Visit: Payer: Medicaid Other | Admitting: Physical Therapy

## 2012-12-13 ENCOUNTER — Ambulatory Visit: Payer: Medicaid Other | Admitting: Physical Therapy

## 2012-12-14 ENCOUNTER — Ambulatory Visit: Payer: Medicaid Other | Admitting: Physical Therapy

## 2013-01-21 ENCOUNTER — Emergency Department (HOSPITAL_COMMUNITY)
Admission: EM | Admit: 2013-01-21 | Discharge: 2013-01-21 | Disposition: A | Discharge status: Home / Self Care | Payer: Medicaid Other | Attending: Emergency Medicine | Admitting: Emergency Medicine

## 2013-01-21 ENCOUNTER — Encounter (HOSPITAL_COMMUNITY): Payer: Self-pay | Admitting: Family Medicine

## 2013-01-21 ENCOUNTER — Ambulatory Visit: Payer: Medicaid Other | Admitting: Physical Therapy

## 2013-01-21 DIAGNOSIS — Z79899 Other long term (current) drug therapy: Secondary | ICD-10-CM | POA: Insufficient documentation

## 2013-01-21 DIAGNOSIS — I10 Essential (primary) hypertension: Secondary | ICD-10-CM

## 2013-01-21 DIAGNOSIS — R05 Cough: Secondary | ICD-10-CM | POA: Insufficient documentation

## 2013-01-21 DIAGNOSIS — Z8739 Personal history of other diseases of the musculoskeletal system and connective tissue: Secondary | ICD-10-CM | POA: Insufficient documentation

## 2013-01-21 DIAGNOSIS — Z8659 Personal history of other mental and behavioral disorders: Secondary | ICD-10-CM | POA: Insufficient documentation

## 2013-01-21 DIAGNOSIS — R059 Cough, unspecified: Secondary | ICD-10-CM | POA: Insufficient documentation

## 2013-01-21 DIAGNOSIS — R040 Epistaxis: Secondary | ICD-10-CM

## 2013-01-21 MED ORDER — BACITRACIN 500 UNIT/GM EX OINT: 1.0000 "application " | TOPICAL_OINTMENT | Freq: Two times a day (BID) | CUTANEOUS | Status: DC

## 2013-01-21 MED ORDER — OXYMETAZOLINE HCL 0.05 % NA SOLN
1.0000 | Freq: Once | NASAL | Status: AC
  Administered 2013-01-21: 1 via NASAL
  Filled 2013-01-21: qty 15

## 2013-01-21 MED ORDER — BACITRACIN 500 UNIT/GM EX OINT
1.0000 "application " | TOPICAL_OINTMENT | Freq: Two times a day (BID) | CUTANEOUS | Status: DC
  Administered 2013-01-21: 1 via TOPICAL
  Filled 2013-01-21 (×2): qty 0.9

## 2013-01-21 MED ORDER — BACITRACIN ZINC 500 UNIT/GM EX OINT: TOPICAL_OINTMENT | Freq: Two times a day (BID) | CUTANEOUS | Status: DC

## 2013-01-21 MED ORDER — CETIRIZINE HCL 10 MG PO TABS: 10.0000 mg | ORAL_TABLET | Freq: Every day | ORAL | Status: DC

## 2013-01-21 NOTE — ED Provider Notes (Signed)
History     CSN: [id number]  Arrival date & time 01/21/13  7829   First MD Initiated Contact with Patient 01/21/13 [phone]      Chief Complaint  Patient presents with  . Epistaxis    (Consider location/radiation/quality/duration/timing/severity/associated sxs/prior treatment) HPI Comments: Pt presents with 2 days of intermittent L anterior epistaxis - this occurred when he returned home from a visit to [place] in [place].  He states when he came off the plane he immediately started to have difficulty breathing with a dry nonproductive cough. The nosebleeds have been in the left nostril, anterior, no signs of dripping down the throat, since the nosebleeds it started he has been coughing up phlegm from his throat which appears to be blood tinged. He denies fevers or chills, nausea or vomiting and has no shortness of breath at this time. He has been applying pressure to his anterior nose which successfully stop the bleeding each time.  Patient is a 37 y.o. male presenting with nosebleeds. The history is provided by the patient.  Epistaxis     Past Medical History  Diagnosis Date  . Hypertension   . Arthritis     arthritis- back & L hip  . Depression     situational depression, pt. out of work   . Osteoarthritis of left hip 06/07/2012    Past Surgical History  Procedure Laterality Date  . Joint replacement      R hip  . Left hip  06/07/2012  . Total hip arthroplasty  06/07/2012    Procedure: TOTAL HIP ARTHROPLASTY;  Surgeon: Eulas Post, MD;  Location: [facility] OR;  Service: Orthopedics;  Laterality: Left;    History reviewed. No pertinent family history.  History  Substance Use Topics  . Smoking status: Never Smoker   . Smokeless tobacco: Never Used  . Alcohol Use: No      Review of Systems  HENT: Positive for nosebleeds.   All other systems reviewed and are negative.    Allergies  Review of patient's allergies indicates no known allergies.  Home Medications   Current  Outpatient Rx  Name  Route  Sig  Dispense  Refill  . ARTEMETHER-LUMEFANTRINE PO   Oral   Take 2 tablets by mouth daily.         . Azilsartan-Chlorthalidone (EDARBYCLOR) 40-25 MG TABS   Oral   Take 1 tablet by mouth daily.         . Cholecalciferol (VITAMIN D3 SUPER STRENGTH) 2000 UNITS TABS   Oral   Take 2,000 Units by mouth daily.         . Multiple Vitamin (MULTIVITAMIN WITH MINERALS) TABS   Oral   Take 1 tablet by mouth daily.         Marland Kitchen omega-3 acid ethyl esters (LOVAZA) 1 G capsule   Oral   Take 1 g by mouth daily.         . bacitracin ointment   Topical   Apply topically 2 (two) times daily.   15 g   0   . cetirizine (ZYRTEC ALLERGY) 10 MG tablet   Oral   Take 1 tablet (10 mg total) by mouth daily.   30 tablet   1     BP 174/103  Pulse 102  Temp(Src) 98.9 F (37.2 C) (Oral)  Resp 20  SpO2 95%  Physical Exam  Nursing note and vitals reviewed. Constitutional: He appears well-developed and well-nourished. No distress.  HENT:  Head: Normocephalic and atraumatic.  Mouth/Throat: Oropharynx is clear and moist. No oropharyngeal exudate.  No active bleeding in either nostril, right nostril is totally clear, left nostril with superficial erythema to the turbinates, no active bleeding, no eschars  Eyes: Conjunctivae and EOM are normal. Pupils are equal, round, and reactive to light. Right eye exhibits no discharge. Left eye exhibits no discharge. No scleral icterus.  Neck: Normal range of motion. Neck supple. No JVD present. No thyromegaly present.  Cardiovascular: Normal rate, regular rhythm, normal heart sounds and intact distal pulses.  Exam reveals no gallop and no friction rub.   No murmur heard. Pulmonary/Chest: Effort normal and breath sounds normal. No respiratory distress. He has no wheezes. He has no rales.  Abdominal: Soft. Bowel sounds are normal. He exhibits no distension and no mass. There is no tenderness.  Musculoskeletal: Normal range of  motion. He exhibits no edema and no tenderness.  Lymphadenopathy:    He has no cervical adenopathy.  Neurological: He is alert. Coordination normal.  Skin: Skin is warm and dry. No rash noted. No erythema.  Psychiatric: He has a normal mood and affect. His behavior is normal.    ED Course  Procedures (including critical care time)  Labs Reviewed - No data to display No results found.   1. Anterior epistaxis   2. Hypertension       MDM  Oropharynx is clear, no signs of posterior nosebleed, the patient appears stable with a normal pulse, afebrile and his blood pressure is slightly elevated. He does take medication for hypertension. At this time I will recommend topical antibiotic cream to the inside of the left nostril, Afrin nasal spray as needed, Zyrtec for seasonal allergies and close followup with family doctor as the patient is planning a return trip to [place] and will need vaccinations up-to-date. He states that he does have a family doctor.   Meds given in ED:  Medications  oxymetazoline (AFRIN) 0.05 % nasal spray 1 spray (not administered)  bacitracin ointment 1 application (not administered)    New Prescriptions   BACITRACIN OINTMENT    Apply topically 2 (two) times daily.   CETIRIZINE (ZYRTEC ALLERGY) 10 MG TABLET    Take 1 tablet (10 mg total) by mouth daily.           Vida Roller, MD 01/21/13 1013

## 2013-01-21 NOTE — ED Notes (Signed)
Per pt sts nosebleeds since yesterday. sts it last for a few minutes and then it would stop. sts recently started on new medication for fatigue. No bleeding at present.

## 2013-01-30 ENCOUNTER — Encounter (HOSPITAL_COMMUNITY): Payer: Self-pay | Admitting: Family Medicine

## 2013-01-30 ENCOUNTER — Emergency Department (HOSPITAL_COMMUNITY)
Admission: EM | Admit: 2013-01-30 | Discharge: 2013-01-30 | Disposition: A | Discharge status: Home / Self Care | Payer: Medicaid Other | Attending: Emergency Medicine | Admitting: Emergency Medicine

## 2013-01-30 DIAGNOSIS — Z79899 Other long term (current) drug therapy: Secondary | ICD-10-CM | POA: Insufficient documentation

## 2013-01-30 DIAGNOSIS — F329 Major depressive disorder, single episode, unspecified: Secondary | ICD-10-CM | POA: Insufficient documentation

## 2013-01-30 DIAGNOSIS — Z8739 Personal history of other diseases of the musculoskeletal system and connective tissue: Secondary | ICD-10-CM | POA: Insufficient documentation

## 2013-01-30 DIAGNOSIS — R040 Epistaxis: Secondary | ICD-10-CM | POA: Insufficient documentation

## 2013-01-30 DIAGNOSIS — F3289 Other specified depressive episodes: Secondary | ICD-10-CM | POA: Insufficient documentation

## 2013-01-30 DIAGNOSIS — J3489 Other specified disorders of nose and nasal sinuses: Secondary | ICD-10-CM | POA: Insufficient documentation

## 2013-01-30 DIAGNOSIS — I10 Essential (primary) hypertension: Secondary | ICD-10-CM | POA: Insufficient documentation

## 2013-01-30 NOTE — ED Provider Notes (Signed)
History     CSN: [id number]  Arrival date & time 01/30/13  1402   First MD Initiated Contact with Patient 01/30/13 1436      Chief Complaint  Patient presents with  . Epistaxis    (Consider location/radiation/quality/duration/timing/severity/associated sxs/prior treatment) HPI  Nicolas [name] is a 37 y.o. male complaining of recurrent left nare nose bleeds. Patient had one nosebleed this morning that lasted approximately 1 minute. He had 2 yesterday that also lasted for a very short period of time. Patient states that he has been compliant appropriately with the Afrin spray and only using it after the nosebleed. He also states that he's been using the antibiotic ointment as instructed twice a day. Patient denies any chest pains, palpitations, shortness of breath. He does endorse a runny nose and states that he's been intermittently compliant with his allergy medications.  Past Medical History  Diagnosis Date  . Hypertension   . Arthritis     arthritis- back & L hip  . Depression     situational depression, pt. out of work   . Osteoarthritis of left hip 06/07/2012    Past Surgical History  Procedure Laterality Date  . Joint replacement      R hip  . Left hip  06/07/2012  . Total hip arthroplasty  06/07/2012    Procedure: TOTAL HIP ARTHROPLASTY;  Surgeon: Eulas Post, MD;  Location: [facility] OR;  Service: Orthopedics;  Laterality: Left;    History reviewed. No pertinent family history.  History  Substance Use Topics  . Smoking status: Never Smoker   . Smokeless tobacco: Never Used  . Alcohol Use: No      Review of Systems  Constitutional: Negative for fever.  HENT: Positive for nosebleeds and rhinorrhea.   Respiratory: Negative for shortness of breath.   Cardiovascular: Negative for chest pain.  Gastrointestinal: Negative for nausea, vomiting, abdominal pain and diarrhea.  All other systems reviewed and are negative.    Allergies  Review of patient's allergies  indicates no known allergies.  Home Medications   Current Outpatient Rx  Name  Route  Sig  Dispense  Refill  . ARTEMETHER-LUMEFANTRINE PO   Oral   Take 2 tablets by mouth daily.         . Azilsartan-Chlorthalidone (EDARBYCLOR) 40-25 MG TABS   Oral   Take 1 tablet by mouth daily.         . bacitracin ointment   Topical   Apply topically 2 (two) times daily.   15 g   0   . cetirizine (ZYRTEC ALLERGY) 10 MG tablet   Oral   Take 1 tablet (10 mg total) by mouth daily.   30 tablet   1   . Cholecalciferol (VITAMIN D3 SUPER STRENGTH) 2000 UNITS TABS   Oral   Take 2,000 Units by mouth daily.         . Multiple Vitamin (MULTIVITAMIN WITH MINERALS) TABS   Oral   Take 1 tablet by mouth daily.         Marland Kitchen omega-3 acid ethyl esters (LOVAZA) 1 G capsule   Oral   Take 1 g by mouth daily.           BP 155/99  Pulse 105  Temp(Src) 99 F (37.2 C) (Oral)  Resp 20  SpO2 91%  Physical Exam  Nursing note and vitals reviewed. Constitutional: He is oriented to person, place, and time. He appears well-developed and well-nourished. No distress.  HENT:  Head: Normocephalic.  Mouth/Throat: Oropharynx is clear and moist.  Patient has small scab to mid left naris the septum. Bleeding controlled.  Eyes: Conjunctivae and EOM are normal.  Cardiovascular: Normal rate.   Pulmonary/Chest: Effort normal. No stridor.  Musculoskeletal: Normal range of motion.  Neurological: He is alert and oriented to person, place, and time.  Psychiatric: He has a normal mood and affect.    ED Course  Procedures (including critical care time)  Labs Reviewed - No data to display No results found.   1. Bleeding from the nose       MDM   [name] is a 37 y.o. male requesting guidance on how to prevent nosebleeds. Advised him that he should more aggressively control his allergic rhinitis and consider humidifying the air. I have advised him to follow with his primary care physician he has  not done so as of yet. I've advised him to do this first and further judgment as to whether ENT consult was warranted. Patient verbalized his understanding.   Filed Vitals:   01/30/13 1411  BP: 155/99  Pulse: 105  Temp: 99 F (37.2 C)  TempSrc: Oral  Resp: 20  SpO2: 91%     VSS and patient is appropriate for, and amenable to, discharge at this time. Pt verbalized understanding and agrees with care plan. Outpatient follow-up and return precautions given.        Wynetta Emery, PA-C 01/30/13 1559

## 2013-01-30 NOTE — ED Notes (Signed)
Per pt sts he has been having nosebleeds everyday and the nasal spray is not helping.

## 2013-01-31 NOTE — ED Provider Notes (Signed)
Medical screening examination/treatment/procedure(s) were performed by non-physician practitioner and as supervising physician I was immediately available for consultation/collaboration.    Shelsey Rieth R Josedejesus Marcum, MD 01/31/13 1238 

## 2013-08-12 ENCOUNTER — Emergency Department (HOSPITAL_COMMUNITY): Payer: Medicaid Other

## 2013-08-12 ENCOUNTER — Encounter (HOSPITAL_COMMUNITY): Payer: Self-pay | Admitting: Emergency Medicine

## 2013-08-12 ENCOUNTER — Inpatient Hospital Stay (HOSPITAL_COMMUNITY)
Admission: EM | Admit: 2013-08-12 | Discharge: 2013-08-15 | DRG: 639 | Disposition: A | Discharge status: Home / Self Care | Payer: Medicaid Other | Attending: Internal Medicine | Admitting: Internal Medicine

## 2013-08-12 DIAGNOSIS — Z96649 Presence of unspecified artificial hip joint: Secondary | ICD-10-CM

## 2013-08-12 DIAGNOSIS — D62 Acute posthemorrhagic anemia: Secondary | ICD-10-CM

## 2013-08-12 DIAGNOSIS — M1612 Unilateral primary osteoarthritis, left hip: Secondary | ICD-10-CM

## 2013-08-12 DIAGNOSIS — I10 Essential (primary) hypertension: Secondary | ICD-10-CM | POA: Diagnosis present

## 2013-08-12 DIAGNOSIS — R110 Nausea: Secondary | ICD-10-CM | POA: Diagnosis present

## 2013-08-12 DIAGNOSIS — Z794 Long term (current) use of insulin: Secondary | ICD-10-CM

## 2013-08-12 DIAGNOSIS — E1110 Type 2 diabetes mellitus with ketoacidosis without coma: Secondary | ICD-10-CM | POA: Diagnosis present

## 2013-08-12 DIAGNOSIS — F4321 Adjustment disorder with depressed mood: Secondary | ICD-10-CM | POA: Diagnosis present

## 2013-08-12 DIAGNOSIS — E1310 Other specified diabetes mellitus with ketoacidosis without coma: Principal | ICD-10-CM | POA: Diagnosis present

## 2013-08-12 LAB — CBC WITH DIFFERENTIAL/PLATELET
Basophils Absolute: 0 10*3/uL (ref 0.0–0.1)
Basophils Relative: 0 % (ref 0–1)
Eosinophils Absolute: 0.2 10*3/uL (ref 0.0–0.7)
Eosinophils Relative: 1 % (ref 0–5)
HCT: 32.1 % — ABNORMAL LOW (ref 39.0–52.0)
Hemoglobin: 11.6 g/dL — ABNORMAL LOW (ref 13.0–17.0)
Lymphocytes Relative: 29 % (ref 12–46)
Lymphs Abs: 4.4 10*3/uL — ABNORMAL HIGH (ref 0.7–4.0)
MCH: 27.4 pg (ref 26.0–34.0)
MCHC: 36.1 g/dL — ABNORMAL HIGH (ref 30.0–36.0)
MCV: 75.9 fL — ABNORMAL LOW (ref 78.0–100.0)
Monocytes Absolute: 1.7 10*3/uL — ABNORMAL HIGH (ref 0.1–1.0)
Monocytes Relative: 11 % (ref 3–12)
Neutro Abs: 8.9 10*3/uL — ABNORMAL HIGH (ref 1.7–7.7)
Neutrophils Relative %: 59 % (ref 43–77)
Platelets: 195 10*3/uL (ref 150–400)
RBC: 4.23 MIL/uL (ref 4.22–5.81)
RDW: 23.2 % — ABNORMAL HIGH (ref 11.5–15.5)
WBC: 15.2 10*3/uL — ABNORMAL HIGH (ref 4.0–10.5)

## 2013-08-12 LAB — COMPREHENSIVE METABOLIC PANEL
ALT: 29 U/L (ref 0–53)
AST: 36 U/L (ref 0–37)
Albumin: 4.3 g/dL (ref 3.5–5.2)
Alkaline Phosphatase: 101 U/L (ref 39–117)
BUN: 26 mg/dL — ABNORMAL HIGH (ref 6–23)
CO2: 20 mEq/L (ref 19–32)
Calcium: 10.1 mg/dL (ref 8.4–10.5)
Chloride: 92 mEq/L — ABNORMAL LOW (ref 96–112)
Creatinine, Ser: 1.48 mg/dL — ABNORMAL HIGH (ref 0.50–1.35)
GFR calc Af Amer: 67 mL/min — ABNORMAL LOW (ref >90)
GFR calc non Af Amer: 58 mL/min — ABNORMAL LOW (ref >90)
Glucose, Bld: 778 mg/dL (ref 70–99)
Potassium: 4.9 mEq/L (ref 3.5–5.1)
Sodium: 134 mEq/L — ABNORMAL LOW (ref 135–145)
Total Bilirubin: 1.2 mg/dL (ref 0.3–1.2)
Total Protein: 8.2 g/dL (ref 6.0–8.3)

## 2013-08-12 LAB — URINE MICROSCOPIC-ADD ON

## 2013-08-12 LAB — URINALYSIS, ROUTINE W REFLEX MICROSCOPIC
Bilirubin Urine: NEGATIVE
Glucose, UA: >1000 mg/dL — AB
Ketones, ur: >80 mg/dL — AB
Leukocytes, UA: NEGATIVE
Nitrite: NEGATIVE
Protein, ur: 30 mg/dL — AB
Specific Gravity, Urine: 1.027 (ref 1.005–1.030)
Urobilinogen, UA: 0.2 mg/dL (ref 0.0–1.0)
pH: 5 (ref 5.0–8.0)

## 2013-08-12 MED ORDER — SODIUM CHLORIDE 0.9 % IV SOLN: 1000.0000 mL | Freq: Once | INTRAVENOUS | Status: DC

## 2013-08-12 MED ORDER — SODIUM CHLORIDE 0.9 % IV SOLN
INTRAVENOUS | Status: DC
  Administered 2013-08-13: 2.9 [IU]/h via INTRAVENOUS
  Administered 2013-08-13: 2.4 [IU]/h via INTRAVENOUS
  Administered 2013-08-13: 7.2 [IU]/h via INTRAVENOUS
  Administered 2013-08-13: 3.2 [IU]/h via INTRAVENOUS
  Administered 2013-08-13: 18:00:00 via INTRAVENOUS
  Administered 2013-08-14: 4.4 [IU]/h via INTRAVENOUS
  Administered 2013-08-14: 4.7 [IU]/h via INTRAVENOUS
  Administered 2013-08-14: 4.8 [IU]/h via INTRAVENOUS
  Administered 2013-08-14: 2.9 [IU]/h via INTRAVENOUS
  Administered 2013-08-14: 2.4 [IU]/h via INTRAVENOUS
  Administered 2013-08-14: 3.6 [IU]/h via INTRAVENOUS
  Filled 2013-08-12 (×3): qty 1

## 2013-08-12 MED ORDER — SODIUM CHLORIDE 0.9 % IV BOLUS (SEPSIS)
1000.0000 mL | Freq: Once | INTRAVENOUS | Status: AC
  Administered 2013-08-13: 1000 mL via INTRAVENOUS

## 2013-08-12 MED ORDER — DEXTROSE-NACL 5-0.45 % IV SOLN: INTRAVENOUS | Status: DC

## 2013-08-12 MED ORDER — SODIUM CHLORIDE 0.9 % IV SOLN
1000.0000 mL | Freq: Once | INTRAVENOUS | Status: AC
  Administered 2013-08-13: 1000 mL via INTRAVENOUS

## 2013-08-12 MED ORDER — SODIUM CHLORIDE 0.9 % IV SOLN
1000.0000 mL | INTRAVENOUS | Status: DC
  Administered 2013-08-13: 1000 mL via INTRAVENOUS

## 2013-08-12 NOTE — ED Notes (Signed)
Pt states that he has been having dizziness for a few weeks. Pt states that it is worse with movement, and he reports feeling like the room is spinning.

## 2013-08-12 NOTE — ED Notes (Signed)
Beaton, MD at bedside. 

## 2013-08-12 NOTE — ED Notes (Signed)
Gardener, MD at bedside.  

## 2013-08-12 NOTE — ED Provider Notes (Signed)
CSN: [id number]     Arrival date & time 08/12/13  2108 History   First MD Initiated Contact with Patient 08/12/13 2141     Chief Complaint  Patient presents with  . Dizziness    HPI The pt is c/o dizziness for 2 weeks and he is also c/o flank pain and he has no appetite.  Denies fever, chill, nausea or vomiting.  Past Medical History  Diagnosis Date  . Hypertension   . Arthritis     arthritis- back & L hip  . Depression     situational depression, pt. out of work   . Osteoarthritis of left hip 06/07/2012   Past Surgical History  Procedure Laterality Date  . Joint replacement      R hip  . Left hip  06/07/2012  . Total hip arthroplasty  06/07/2012    Procedure: TOTAL HIP ARTHROPLASTY;  Surgeon: Eulas Post, MD;  Location: [facility] OR;  Service: Orthopedics;  Laterality: Left;   No family history on file. History  Substance Use Topics  . Smoking status: Never Smoker   . Smokeless tobacco: Never Used  . Alcohol Use: No    Review of Systems  All other systems reviewed and are negative.    Allergies  Review of patient's allergies indicates no known allergies.  Home Medications   No current outpatient prescriptions on file. BP 114/54  Pulse 92  Temp(Src) 98.4 F (36.9 C) (Oral)  Resp 26  Ht 5\' 10"  (1.778 m)  Wt 240 lb 15.4 oz (109.3 kg)  BMI 34.57 kg/m2  SpO2 95% Physical Exam  Nursing note and vitals reviewed. Constitutional: He is oriented to person, place, and time. He appears well-developed and well-nourished. No distress.  HENT:  Head: Normocephalic and atraumatic.  Mouth/Throat: Mucous membranes are dry.  Eyes: Pupils are equal, round, and reactive to light.  Neck: Normal range of motion.  Cardiovascular: Normal rate and intact distal pulses.   Pulmonary/Chest: No respiratory distress.  Abdominal: Normal appearance. He exhibits no distension.  Musculoskeletal: Normal range of motion.  Neurological: He is alert and oriented to person, place, and time. No  cranial nerve deficit.  Skin: Skin is warm and dry. No rash noted.  Psychiatric: He has a normal mood and affect. His behavior is normal.    ED Course  Procedures (including critical care time)  CRITICAL CARE Performed by: Nelva Nay L Total critical care time30 min Critical care time was exclusive of separately billable procedures and treating other patients. Critical care was necessary to treat or prevent imminent or life-threatening deterioration. Critical care was time spent personally by me on the following activities: development of treatment plan with patient and/or surrogate as well as nursing, discussions with consultants, evaluation of patient's response to treatment, examination of patient, obtaining history from patient or surrogate, ordering and performing treatments and interventions, ordering and review of laboratory studies, ordering and review of radiographic studies, pulse oximetry and re-evaluation of patient's condition.  Labs Review Labs Reviewed  MRSA PCR SCREENING - Abnormal; Notable for the following:    MRSA by PCR POSITIVE (*)    All other components within normal limits  CBC WITH DIFFERENTIAL - Abnormal; Notable for the following:    WBC 15.2 (*)    Hemoglobin 11.6 (*)    HCT 32.1 (*)    MCV 75.9 (*)    MCHC 36.1 (*)    RDW 23.2 (*)    Neutro Abs 8.9 (*)    Lymphs Abs 4.4 (*)  Monocytes Absolute 1.7 (*)    All other components within normal limits  COMPREHENSIVE METABOLIC PANEL - Abnormal; Notable for the following:    Sodium 134 (*)    Chloride 92 (*)    Glucose, Bld 778 (*)    BUN 26 (*)    Creatinine, Ser 1.48 (*)    GFR calc non Af Amer 58 (*)    GFR calc Af Amer 67 (*)    All other components within normal limits  URINALYSIS, ROUTINE W REFLEX MICROSCOPIC - Abnormal; Notable for the following:    Glucose, UA >1000 (*)    Hgb urine dipstick SMALL (*)    Ketones, ur >80 (*)    Protein, ur 30 (*)    All other components within normal  limits  GLUCOSE, CAPILLARY - Abnormal; Notable for the following:    Glucose-Capillary >600 (*)    All other components within normal limits  BASIC METABOLIC PANEL - Abnormal; Notable for the following:    Glucose, Bld 657 (*)    BUN 25 (*)    Creatinine, Ser 1.37 (*)    GFR calc non Af Amer 64 (*)    GFR calc Af Amer 74 (*)    All other components within normal limits  BASIC METABOLIC PANEL - Abnormal; Notable for the following:    Glucose, Bld 468 (*)    GFR calc non Af Amer 71 (*)    GFR calc Af Amer 82 (*)    All other components within normal limits  BASIC METABOLIC PANEL - Abnormal; Notable for the following:    Sodium 146 (*)    Glucose, Bld 337 (*)    GFR calc non Af Amer 71 (*)    GFR calc Af Amer 82 (*)    All other components within normal limits  BASIC METABOLIC PANEL - Abnormal; Notable for the following:    Glucose, Bld 339 (*)    GFR calc non Af Amer 72 (*)    GFR calc Af Amer 83 (*)    All other components within normal limits  GLUCOSE, CAPILLARY - Abnormal; Notable for the following:    Glucose-Capillary 517 (*)    All other components within normal limits  GLUCOSE, CAPILLARY - Abnormal; Notable for the following:    Glucose-Capillary 552 (*)    All other components within normal limits  GLUCOSE, CAPILLARY - Abnormal; Notable for the following:    Glucose-Capillary 402 (*)    All other components within normal limits  GLUCOSE, CAPILLARY - Abnormal; Notable for the following:    Glucose-Capillary 352 (*)    All other components within normal limits  GLUCOSE, CAPILLARY - Abnormal; Notable for the following:    Glucose-Capillary 283 (*)    All other components within normal limits  GLUCOSE, CAPILLARY - Abnormal; Notable for the following:    Glucose-Capillary 247 (*)    All other components within normal limits  GLUCOSE, CAPILLARY - Abnormal; Notable for the following:    Glucose-Capillary 178 (*)    All other components within normal limits  GLUCOSE,  CAPILLARY - Abnormal; Notable for the following:    Glucose-Capillary 148 (*)    All other components within normal limits  GLUCOSE, CAPILLARY - Abnormal; Notable for the following:    Glucose-Capillary 203 (*)    All other components within normal limits  HEMOGLOBIN A1C - Abnormal; Notable for the following:    Hemoglobin A1C 10.6 (*)    Mean Plasma Glucose 258 (*)  All other components within normal limits  GLUCOSE, CAPILLARY - Abnormal; Notable for the following:    Glucose-Capillary 262 (*)    All other components within normal limits  GLUCOSE, CAPILLARY - Abnormal; Notable for the following:    Glucose-Capillary 176 (*)    All other components within normal limits  GLUCOSE, CAPILLARY - Abnormal; Notable for the following:    Glucose-Capillary 145 (*)    All other components within normal limits  GLUCOSE, CAPILLARY - Abnormal; Notable for the following:    Glucose-Capillary 118 (*)    All other components within normal limits  GLUCOSE, CAPILLARY - Abnormal; Notable for the following:    Glucose-Capillary 297 (*)    All other components within normal limits  GLUCOSE, CAPILLARY - Abnormal; Notable for the following:    Glucose-Capillary 360 (*)    All other components within normal limits  GLUCOSE, CAPILLARY - Abnormal; Notable for the following:    Glucose-Capillary 334 (*)    All other components within normal limits  GLUCOSE, CAPILLARY - Abnormal; Notable for the following:    Glucose-Capillary 297 (*)    All other components within normal limits  GLUCOSE, CAPILLARY - Abnormal; Notable for the following:    Glucose-Capillary 153 (*)    All other components within normal limits  BASIC METABOLIC PANEL - Abnormal; Notable for the following:    Potassium 3.4 (*)    Glucose, Bld 111 (*)    GFR calc non Af Amer 74 (*)    GFR calc Af Amer 86 (*)    All other components within normal limits  BASIC METABOLIC PANEL - Abnormal; Notable for the following:    Potassium 3.2 (*)     Glucose, Bld 143 (*)    GFR calc non Af Amer 74 (*)    GFR calc Af Amer 86 (*)    All other components within normal limits  BASIC METABOLIC PANEL - Abnormal; Notable for the following:    Potassium 2.7 (*)    Glucose, Bld 172 (*)    GFR calc non Af Amer 73 (*)    GFR calc Af Amer 85 (*)    All other components within normal limits  BASIC METABOLIC PANEL - Abnormal; Notable for the following:    Glucose, Bld 178 (*)    Calcium 8.2 (*)    GFR calc non Af Amer 77 (*)    GFR calc Af Amer 89 (*)    All other components within normal limits  GLUCOSE, CAPILLARY - Abnormal; Notable for the following:    Glucose-Capillary 104 (*)    All other components within normal limits  GLUCOSE, CAPILLARY - Abnormal; Notable for the following:    Glucose-Capillary 107 (*)    All other components within normal limits  GLUCOSE, CAPILLARY - Abnormal; Notable for the following:    Glucose-Capillary 133 (*)    All other components within normal limits  GLUCOSE, CAPILLARY - Abnormal; Notable for the following:    Glucose-Capillary 141 (*)    All other components within normal limits  GLUCOSE, CAPILLARY - Abnormal; Notable for the following:    Glucose-Capillary 139 (*)    All other components within normal limits  GLUCOSE, CAPILLARY - Abnormal; Notable for the following:    Glucose-Capillary 156 (*)    All other components within normal limits  GLUCOSE, CAPILLARY - Abnormal; Notable for the following:    Glucose-Capillary 179 (*)    All other components within normal limits  GLUCOSE, CAPILLARY - Abnormal;  Notable for the following:    Glucose-Capillary 184 (*)    All other components within normal limits  GLUCOSE, CAPILLARY - Abnormal; Notable for the following:    Glucose-Capillary 177 (*)    All other components within normal limits  GLUCOSE, CAPILLARY - Abnormal; Notable for the following:    Glucose-Capillary 171 (*)    All other components within normal limits  GLUCOSE, CAPILLARY -  Abnormal; Notable for the following:    Glucose-Capillary 189 (*)    All other components within normal limits  URINE CULTURE  URINE MICROSCOPIC-ADD ON  GLUCOSE, CAPILLARY  GLUCOSE, CAPILLARY   Imaging Review Dg Chest 2 View  08/12/2013   CLINICAL DATA:  DKAdka  EXAM: CHEST  2 VIEW  COMPARISON:  DG CHEST 2 VIEW dated 06/09/2012  FINDINGS: There are low lung volumes similar prior. Cardiac silhouette is normal. There is mild a perihilar linear opacities suggesting interstitial edema or pulmonary edema. No pneumothorax.  IMPRESSION: Low lung volumes with mild interstitial edema or pulmonary edema.   Electronically Signed   By: Genevive Bi M.D.   On: 08/12/2013 23:52    EKG Interpretation     Ventricular Rate:  117 PR Interval:  146 QRS Duration: 84 QT Interval:  308 QTC Calculation: 429 R Axis:   -4 Text Interpretation:  Sinus tachycardia Left ventricular hypertrophy Abnormal ECG No significant change since last tracing           Medications  ondansetron (ZOFRAN) injection 4 mg (not administered)  carvedilol (COREG) tablet 6.25 mg (6.25 mg Oral Given 08/14/13 0830)  dextrose 50 % solution 25 mL (not administered)  heparin injection 5,000 Units (5,000 Units Subcutaneous Given 08/14/13 0524)  irbesartan (AVAPRO) tablet 300 mg (300 mg Oral Given 08/14/13 1010)    And  amLODipine (NORVASC) tablet 10 mg (10 mg Oral Given 08/14/13 1010)    And  hydrochlorothiazide (HYDRODIURIL) tablet 25 mg (25 mg Oral Given 08/14/13 1011)  insulin glargine (LANTUS) injection 10 Units (10 Units Subcutaneous Given 08/14/13 0314)  0.9 %  sodium chloride infusion (1,000 mLs Intravenous New Bag/Given 08/14/13 0601)  insulin aspart (novoLOG) injection 0-15 Units (15 Units Subcutaneous Given 08/14/13 1224)  sodium chloride 0.9 % bolus 1,000 mL (0 mLs Intravenous Stopped 08/13/13 0052)  0.9 %  sodium chloride infusion (1,000 mLs Intravenous New Bag/Given 08/13/13 0056)  potassium chloride 10 mEq in  100 mL IVPB (10 mEq Intravenous Given 08/13/13 0306)  living well with diabetes book MISC ( Does not apply Given 08/13/13 0430)  potassium chloride 10 mEq in 100 mL IVPB (10 mEq Intravenous Given 08/14/13 0959)    CRITICAL CARE Performed by: Nelva Nay L Total critical care time: 30 min Critical care time was exclusive of separately billable procedures and treating other patients. Critical care was necessary to treat or prevent imminent or life-threatening deterioration. Critical care was time spent personally by me on the following activities: development of treatment plan with patient and/or surrogate as well as nursing, discussions with consultants, evaluation of patient's response to treatment, examination of patient, obtaining history from patient or surrogate, ordering and performing treatments and interventions, ordering and review of laboratory studies, ordering and review of radiographic studies, pulse oximetry and re-evaluation of patient's condition.   MDM   1. Diabetic ketoacidosis   2. DKA, type 2        Nelia Shi, MD 08/14/13 1351

## 2013-08-12 NOTE — ED Notes (Signed)
The pt is c/o dizziness for 2 weeks and he is also c/o flank pain and he has no appetite

## 2013-08-12 NOTE — ED Notes (Signed)
Radford Pax, MD is aware of the pt's blood glucose level.

## 2013-08-13 DIAGNOSIS — E1310 Other specified diabetes mellitus with ketoacidosis without coma: Principal | ICD-10-CM

## 2013-08-13 DIAGNOSIS — E1110 Type 2 diabetes mellitus with ketoacidosis without coma: Secondary | ICD-10-CM | POA: Diagnosis present

## 2013-08-13 LAB — BASIC METABOLIC PANEL
BUN: 15 mg/dL (ref 6–23)
BUN: 16 mg/dL (ref 6–23)
BUN: 17 mg/dL (ref 6–23)
BUN: 22 mg/dL (ref 6–23)
BUN: 22 mg/dL (ref 6–23)
BUN: 25 mg/dL — ABNORMAL HIGH (ref 6–23)
CO2: 19 mEq/L (ref 19–32)
CO2: 21 mEq/L (ref 19–32)
CO2: 24 mEq/L (ref 19–32)
CO2: 26 mEq/L (ref 19–32)
CO2: 26 mEq/L (ref 19–32)
CO2: 27 mEq/L (ref 19–32)
Calcium: 8.6 mg/dL (ref 8.4–10.5)
Calcium: 8.7 mg/dL (ref 8.4–10.5)
Calcium: 8.7 mg/dL (ref 8.4–10.5)
Calcium: 9.6 mg/dL (ref 8.4–10.5)
Calcium: 9.9 mg/dL (ref 8.4–10.5)
Calcium: 9.9 mg/dL (ref 8.4–10.5)
Chloride: 103 mEq/L (ref 96–112)
Chloride: 106 mEq/L (ref 96–112)
Chloride: 106 mEq/L (ref 96–112)
Chloride: 107 mEq/L (ref 96–112)
Chloride: 108 mEq/L (ref 96–112)
Chloride: 96 mEq/L (ref 96–112)
Creatinine, Ser: 1.21 mg/dL (ref 0.50–1.35)
Creatinine, Ser: 1.21 mg/dL (ref 0.50–1.35)
Creatinine, Ser: 1.24 mg/dL (ref 0.50–1.35)
Creatinine, Ser: 1.25 mg/dL (ref 0.50–1.35)
Creatinine, Ser: 1.25 mg/dL (ref 0.50–1.35)
Creatinine, Ser: 1.37 mg/dL — ABNORMAL HIGH (ref 0.50–1.35)
GFR calc Af Amer: 74 mL/min — ABNORMAL LOW (ref >90)
GFR calc Af Amer: 82 mL/min — ABNORMAL LOW (ref >90)
GFR calc Af Amer: 82 mL/min — ABNORMAL LOW (ref >90)
GFR calc Af Amer: 83 mL/min — ABNORMAL LOW (ref >90)
GFR calc Af Amer: 86 mL/min — ABNORMAL LOW (ref >90)
GFR calc Af Amer: 86 mL/min — ABNORMAL LOW (ref >90)
GFR calc non Af Amer: 64 mL/min — ABNORMAL LOW (ref >90)
GFR calc non Af Amer: 71 mL/min — ABNORMAL LOW (ref >90)
GFR calc non Af Amer: 71 mL/min — ABNORMAL LOW (ref >90)
GFR calc non Af Amer: 72 mL/min — ABNORMAL LOW (ref >90)
GFR calc non Af Amer: 74 mL/min — ABNORMAL LOW (ref >90)
GFR calc non Af Amer: 74 mL/min — ABNORMAL LOW (ref >90)
Glucose, Bld: 111 mg/dL — ABNORMAL HIGH (ref 70–99)
Glucose, Bld: 143 mg/dL — ABNORMAL HIGH (ref 70–99)
Glucose, Bld: 337 mg/dL — ABNORMAL HIGH (ref 70–99)
Glucose, Bld: 339 mg/dL — ABNORMAL HIGH (ref 70–99)
Glucose, Bld: 468 mg/dL — ABNORMAL HIGH (ref 70–99)
Glucose, Bld: 657 mg/dL (ref 70–99)
Potassium: 3.2 mEq/L — ABNORMAL LOW (ref 3.5–5.1)
Potassium: 3.4 mEq/L — ABNORMAL LOW (ref 3.5–5.1)
Potassium: 3.5 mEq/L (ref 3.5–5.1)
Potassium: 4.3 mEq/L (ref 3.5–5.1)
Potassium: 4.6 mEq/L (ref 3.5–5.1)
Potassium: 4.8 mEq/L (ref 3.5–5.1)
Sodium: 137 mEq/L (ref 135–145)
Sodium: 139 mEq/L (ref 135–145)
Sodium: 142 mEq/L (ref 135–145)
Sodium: 143 mEq/L (ref 135–145)
Sodium: 144 mEq/L (ref 135–145)
Sodium: 146 mEq/L — ABNORMAL HIGH (ref 135–145)

## 2013-08-13 LAB — GLUCOSE, CAPILLARY
Glucose-Capillary: 104 mg/dL — ABNORMAL HIGH (ref 70–99)
Glucose-Capillary: 118 mg/dL — ABNORMAL HIGH (ref 70–99)
Glucose-Capillary: 145 mg/dL — ABNORMAL HIGH (ref 70–99)
Glucose-Capillary: 148 mg/dL — ABNORMAL HIGH (ref 70–99)
Glucose-Capillary: 153 mg/dL — ABNORMAL HIGH (ref 70–99)
Glucose-Capillary: 176 mg/dL — ABNORMAL HIGH (ref 70–99)
Glucose-Capillary: 178 mg/dL — ABNORMAL HIGH (ref 70–99)
Glucose-Capillary: 203 mg/dL — ABNORMAL HIGH (ref 70–99)
Glucose-Capillary: 247 mg/dL — ABNORMAL HIGH (ref 70–99)
Glucose-Capillary: 262 mg/dL — ABNORMAL HIGH (ref 70–99)
Glucose-Capillary: 283 mg/dL — ABNORMAL HIGH (ref 70–99)
Glucose-Capillary: 297 mg/dL — ABNORMAL HIGH (ref 70–99)
Glucose-Capillary: 297 mg/dL — ABNORMAL HIGH (ref 70–99)
Glucose-Capillary: 334 mg/dL — ABNORMAL HIGH (ref 70–99)
Glucose-Capillary: 352 mg/dL — ABNORMAL HIGH (ref 70–99)
Glucose-Capillary: 360 mg/dL — ABNORMAL HIGH (ref 70–99)
Glucose-Capillary: 402 mg/dL — ABNORMAL HIGH (ref 70–99)
Glucose-Capillary: 517 mg/dL — ABNORMAL HIGH (ref 70–99)
Glucose-Capillary: 552 mg/dL (ref 70–99)
Glucose-Capillary: 80 mg/dL (ref 70–99)
Glucose-Capillary: 94 mg/dL (ref 70–99)
Glucose-Capillary: >600 mg/dL (ref 70–99)

## 2013-08-13 LAB — MRSA PCR SCREENING: MRSA by PCR: POSITIVE — AB

## 2013-08-13 LAB — HEMOGLOBIN A1C
Hgb A1c MFr Bld: 10.6 % — ABNORMAL HIGH (ref <5.7)
Mean Plasma Glucose: 258 mg/dL — ABNORMAL HIGH (ref <117)

## 2013-08-13 MED ORDER — DEXTROSE 50 % IV SOLN: 25.0000 mL | INTRAVENOUS | Status: DC | PRN

## 2013-08-13 MED ORDER — OLMESARTAN-AMLODIPINE-HCTZ 40-10-25 MG PO TABS: 1.0000 | ORAL_TABLET | Freq: Every day | ORAL | Status: DC

## 2013-08-13 MED ORDER — ONDANSETRON HCL 4 MG/2ML IJ SOLN: 4.0000 mg | Freq: Three times a day (TID) | INTRAMUSCULAR | Status: AC | PRN

## 2013-08-13 MED ORDER — CARVEDILOL 6.25 MG PO TABS
6.2500 mg | ORAL_TABLET | Freq: Two times a day (BID) | ORAL | Status: DC
  Administered 2013-08-13 – 2013-08-15 (×6): 6.25 mg via ORAL
  Filled 2013-08-13 (×8): qty 1

## 2013-08-13 MED ORDER — IRBESARTAN 300 MG PO TABS
300.0000 mg | ORAL_TABLET | Freq: Every day | ORAL | Status: DC
  Administered 2013-08-14 – 2013-08-15 (×2): 300 mg via ORAL
  Filled 2013-08-13 (×3): qty 1

## 2013-08-13 MED ORDER — HEPARIN SODIUM (PORCINE) 5000 UNIT/ML IJ SOLN
5000.0000 [IU] | Freq: Three times a day (TID) | INTRAMUSCULAR | Status: DC
  Administered 2013-08-13 – 2013-08-15 (×7): 5000 [IU] via SUBCUTANEOUS
  Filled 2013-08-13 (×11): qty 1

## 2013-08-13 MED ORDER — POTASSIUM CHLORIDE 10 MEQ/100ML IV SOLN
10.0000 meq | INTRAVENOUS | Status: AC
  Administered 2013-08-13 (×2): 10 meq via INTRAVENOUS
  Filled 2013-08-13 (×2): qty 100

## 2013-08-13 MED ORDER — AMLODIPINE BESYLATE 10 MG PO TABS
10.0000 mg | ORAL_TABLET | Freq: Every day | ORAL | Status: DC
  Administered 2013-08-14 – 2013-08-15 (×2): 10 mg via ORAL
  Filled 2013-08-13 (×3): qty 1

## 2013-08-13 MED ORDER — HYDROCHLOROTHIAZIDE 25 MG PO TABS
25.0000 mg | ORAL_TABLET | Freq: Every day | ORAL | Status: DC
  Administered 2013-08-14 – 2013-08-15 (×2): 25 mg via ORAL
  Filled 2013-08-13 (×3): qty 1

## 2013-08-13 MED ORDER — SODIUM CHLORIDE 0.9 % IV SOLN: INTRAVENOUS | Status: DC

## 2013-08-13 MED ORDER — LIVING WELL WITH DIABETES BOOK
Freq: Once | Status: AC
  Administered 2013-08-13: 05:00:00
  Filled 2013-08-13: qty 1

## 2013-08-13 MED ORDER — SODIUM CHLORIDE 0.45 % IV SOLN
INTRAVENOUS | Status: DC
  Administered 2013-08-13: 15:00:00 via INTRAVENOUS

## 2013-08-13 MED ORDER — DEXTROSE-NACL 5-0.45 % IV SOLN
INTRAVENOUS | Status: DC
  Administered 2013-08-13 (×2): via INTRAVENOUS

## 2013-08-13 NOTE — H&P (Signed)
[facility] History and Physical  Charon Akamine RUE:[id number] DOB: [birth year] DOA: 08/12/2013  Referring physician: ED PCP: Hayden Rasmussen, NP   Chief Complaint: Dizziness  HPI: Nicolas [name] is a 37 y.o. male who presents to the ED with polyuria, polydipsia, dizziness, dry mouth, and nausea.  Symptoms onset 2 weeks ago and have been worsening in that time period.  States he has to get up 10 times a night to use the bathroom.  Context: he has a h/o DM2 in the past, was on medications 3 years ago before they were stopped because his BGLs had been so good, has been on no DM meds since that time.  Lab work in ED showed BGL of 778, remainder of workup c/w DKA with anion gap of 22.  Review of Systems: 12 systems reviewed and otherwise negative.  Past Medical History  Diagnosis Date  . Hypertension   . Arthritis     arthritis- back & L hip  . Depression     situational depression, pt. out of work   . Osteoarthritis of left hip 06/07/2012   Past Surgical History  Procedure Laterality Date  . Joint replacement      R hip  . Left hip  06/07/2012  . Total hip arthroplasty  06/07/2012    Procedure: TOTAL HIP ARTHROPLASTY;  Surgeon: Eulas Post, MD;  Location: [facility] OR;  Service: Orthopedics;  Laterality: Left;   Social History:  reports that he has never smoked. He has never used smokeless tobacco. He reports that he does not drink alcohol or use illicit drugs.   No Known Allergies  No family history on file.   Prior to Admission medications   Medication Sig Start Date End Date Taking? Authorizing Provider  carvedilol (COREG) 6.25 MG tablet Take 6.25 mg by mouth 2 (two) times daily with a meal.   Yes Historical Provider, MD  Olmesartan-Amlodipine-HCTZ (TRIBENZOR) 40-10-25 MG TABS Take 1 tablet by mouth daily.   Yes Historical Provider, MD   Physical Exam: Filed Vitals:   08/12/13 2316  BP: 141/72  Pulse: 106  Temp: 99.5 F (37.5 C)  Resp: 20     General:  NAD, resting  comfortably in bed, obvious odor of ketones on patients breath  Eyes: PEERLA EOMI  ENT: mucous membranes moist  Neck: supple w/o JVD  Cardiovascular: RRR w/o MRG  Respiratory: CTA B  Abdomen: soft, nt, nd, bs+  Skin: no rash nor lesion  Musculoskeletal: MAE, full ROM all 4 extremities  Psychiatric: normal tone and affect  Neurologic: AAOx3, grossly non-focal   Labs on Admission:  Basic Metabolic Panel:  Recent Labs Lab 08/12/13 2116  NA 134*  K 4.9  CL 92*  CO2 20  GLUCOSE 778*  BUN 26*  CREATININE 1.48*  CALCIUM 10.1   Liver Function Tests:  Recent Labs Lab 08/12/13 2116  AST 36  ALT 29  ALKPHOS 101  BILITOT 1.2  PROT 8.2  ALBUMIN 4.3   No results found for this basename: LIPASE, AMYLASE,  in the last 168 hours No results found for this basename: AMMONIA,  in the last 168 hours CBC:  Recent Labs Lab 08/12/13 2116  WBC 15.2*  NEUTROABS 8.9*  HGB 11.6*  HCT 32.1*  MCV 75.9*  PLT 195   Cardiac Enzymes: No results found for this basename: CKTOTAL, CKMB, CKMBINDEX, TROPONINI,  in the last 168 hours  BNP (last 3 results) No results found for this basename: PROBNP,  in the last 8760 hours CBG:  Recent Labs Lab 08/13/13 0007  GLUCAP >600*    Radiological Exams on Admission: Dg Chest 2 View  08/12/2013   CLINICAL DATA:  DKAdka  EXAM: CHEST  2 VIEW  COMPARISON:  DG CHEST 2 VIEW dated 06/09/2012  FINDINGS: There are low lung volumes similar prior. Cardiac silhouette is normal. There is mild a perihilar linear opacities suggesting interstitial edema or pulmonary edema. No pneumothorax.  IMPRESSION: Low lung volumes with mild interstitial edema or pulmonary edema.   Electronically Signed   By: Genevive Bi M.D.   On: 08/12/2013 23:52    EKG: Independently reviewed.  Assessment/Plan Principal Problem:   DKA, type 2   1. DKA, type 2 - likely occuring as patient has been on no DM meds for 3 years.  WBC of 15k but no other evidence nor  source for infection at this time.  UA demonstrated keytones.  Admit to SDU on DKA protocol, insulin gtt, fluids.  Stressed importance of long term follow up and treatment for his DM.   Code Status: Full Code (must indicate code status--if unknown or must be presumed, indicate so) Family Communication: Family at bedside (indicate person spoken with, if applicable, with phone number if by telephone) Disposition Plan: Admit to SDU (indicate anticipated LOS)  Time spent: 50 min  Gayla Benn M. [facility] Pager [phone]  If 7PM-7AM, please contact night-coverage [email or web address] Password [email or web address] 08/13/2013, 12:21 AM

## 2013-08-13 NOTE — Progress Notes (Signed)
[facility] PROGRESS NOTE  Nicolas [name] OZH:[id number] DOB: 1975-11-20 DOA: 08/12/2013 PCP: Hayden Rasmussen, NP I have seen and examined pt who is a 37yo admitted this am by Dr Julian Reil with  with history of diabetes and hypertension presenting polyuria, polydipsia, dizziness, dry mouth, and nausea and was found to be in DKA. We'll continue IV insulin via glucostabilizer, follow and transition to subcutaneous insulin, monitor blood sugars and adjust medications for optimal blood glucose control. Will obtain A1C, continue current management plan as per Dr. Julian Reil and follow.  Kela Millin  [facility] Pager [phone]. If 7PM-7AM, please contact night-coverage at [email or web address], password [facility] 08/13/2013, 9:22 AM  LOS: 1 day

## 2013-08-13 NOTE — Progress Notes (Signed)
Nutrition Brief Note  Patient identified on the Malnutrition Screening Tool (MST) Report. Pt with weight loss likely 2/2 blood sugar control. He states that it is 2/2 his HTN. He is currently tolerating his meals well. Well-nourished.  Wt Readings from Last 15 Encounters:  08/13/13 226 lb 3.1 oz (102.6 kg)  05/27/12 254 lb 1.6 oz (115.259 kg)    Body mass index is 32.46 kg/(m^2). Patient meets criteria for Obese Class I based on current BMI.   Current diet order is Clear Liquids, patient is consuming approximately 50-100% of meals at this time. Labs and medications reviewed.   No nutrition interventions warranted at this time. If nutrition issues arise, please consult RD.   Jarold Motto MS, RD, LDN Pager: [phone] After-hours pager: [phone]

## 2013-08-13 NOTE — Plan of Care (Signed)
Problem: Food- and Nutrition-Related Knowledge Deficit (NB-1.1) Goal: Nutrition education Formal process to instruct or train a patient/client in a skill or to impart knowledge to help patients/clients voluntarily manage or modify food choices and eating behavior to maintain or improve health. Outcome: Completed/Met Date Met:  08/13/13  Nutrition Education Note  RD provided nutrition education regarding diabetes.     No results found for this basename: HGBA1C    RD provided "Carbohydrate Counting for People with Diabetes" handout from the Academy of Nutrition and Dietetics. Discussed different food groups and their effects on blood sugar, emphasizing carbohydrate-containing foods. Provided list of carbohydrates and recommended serving sizes of common foods.  Discussed importance of controlled and consistent carbohydrate intake throughout the day. Provided examples of ways to balance meals/snacks and encouraged intake of high-fiber, whole grain complex carbohydrates. Teach back method used.  Expect good compliance.  Body mass index is 32.46 kg/(m^2). Pt meets criteria for Obese Class I based on current BMI.  Current diet order is Clear Liquids, patient is consuming approximately 50-100% of meals at this time. Labs and medications reviewed. No further nutrition interventions warranted at this time. RD contact information provided. If additional nutrition issues arise, please re-consult RD.  Jarold Motto MS, RD, LDN Pager: [phone] After-hours pager: [phone]

## 2013-08-14 DIAGNOSIS — I10 Essential (primary) hypertension: Secondary | ICD-10-CM

## 2013-08-14 LAB — URINE CULTURE: Culture: NO GROWTH

## 2013-08-14 LAB — BASIC METABOLIC PANEL
BUN: 14 mg/dL (ref 6–23)
BUN: 13 mg/dL (ref 6–23)
CO2: 26 mEq/L (ref 19–32)
CO2: 19 mEq/L (ref 19–32)
Calcium: 8.6 mg/dL (ref 8.4–10.5)
Calcium: 8.2 mg/dL — ABNORMAL LOW (ref 8.4–10.5)
Chloride: 104 mEq/L (ref 96–112)
Chloride: 104 mEq/L (ref 96–112)
Creatinine, Ser: 1.22 mg/dL (ref 0.50–1.35)
Creatinine, Ser: 1.17 mg/dL (ref 0.50–1.35)
GFR calc Af Amer: 85 mL/min — ABNORMAL LOW (ref >90)
GFR calc Af Amer: 89 mL/min — ABNORMAL LOW (ref >90)
GFR calc non Af Amer: 73 mL/min — ABNORMAL LOW (ref >90)
GFR calc non Af Amer: 77 mL/min — ABNORMAL LOW (ref >90)
Glucose, Bld: 172 mg/dL — ABNORMAL HIGH (ref 70–99)
Glucose, Bld: 178 mg/dL — ABNORMAL HIGH (ref 70–99)
Potassium: 2.7 mEq/L — CL (ref 3.5–5.1)
Potassium: 3.9 mEq/L (ref 3.5–5.1)
Sodium: 139 mEq/L (ref 135–145)
Sodium: 136 mEq/L (ref 135–145)

## 2013-08-14 LAB — GLUCOSE, CAPILLARY
Glucose-Capillary: 107 mg/dL — ABNORMAL HIGH (ref 70–99)
Glucose-Capillary: 133 mg/dL — ABNORMAL HIGH (ref 70–99)
Glucose-Capillary: 139 mg/dL — ABNORMAL HIGH (ref 70–99)
Glucose-Capillary: 141 mg/dL — ABNORMAL HIGH (ref 70–99)
Glucose-Capillary: 156 mg/dL — ABNORMAL HIGH (ref 70–99)
Glucose-Capillary: 171 mg/dL — ABNORMAL HIGH (ref 70–99)
Glucose-Capillary: 177 mg/dL — ABNORMAL HIGH (ref 70–99)
Glucose-Capillary: 179 mg/dL — ABNORMAL HIGH (ref 70–99)
Glucose-Capillary: 184 mg/dL — ABNORMAL HIGH (ref 70–99)
Glucose-Capillary: 189 mg/dL — ABNORMAL HIGH (ref 70–99)
Glucose-Capillary: 368 mg/dL — ABNORMAL HIGH (ref 70–99)
Glucose-Capillary: 435 mg/dL — ABNORMAL HIGH (ref 70–99)
Glucose-Capillary: 352 mg/dL — ABNORMAL HIGH (ref 70–99)

## 2013-08-14 MED ORDER — INSULIN GLARGINE 100 UNIT/ML ~~LOC~~ SOLN
10.0000 [IU] | Freq: Every day | SUBCUTANEOUS | Status: DC
  Administered 2013-08-14: 10 [IU] via SUBCUTANEOUS
  Filled 2013-08-14 (×2): qty 0.1

## 2013-08-14 MED ORDER — MUPIROCIN 2 % EX OINT
1.0000 "application " | TOPICAL_OINTMENT | Freq: Two times a day (BID) | CUTANEOUS | Status: DC
  Administered 2013-08-14 – 2013-08-15 (×3): 1 via NASAL
  Filled 2013-08-14: qty 22

## 2013-08-14 MED ORDER — INSULIN GLARGINE 100 UNIT/ML ~~LOC~~ SOLN
10.0000 [IU] | Freq: Every day | SUBCUTANEOUS | Status: DC
  Administered 2013-08-14: 10 [IU] via SUBCUTANEOUS
  Filled 2013-08-14: qty 0.1

## 2013-08-14 MED ORDER — MAGNESIUM HYDROXIDE 400 MG/5ML PO SUSP
30.0000 mL | Freq: Every day | ORAL | Status: DC
  Administered 2013-08-14 – 2013-08-15 (×2): 30 mL via ORAL
  Filled 2013-08-14 (×2): qty 30

## 2013-08-14 MED ORDER — CHLORHEXIDINE GLUCONATE CLOTH 2 % EX PADS
6.0000 | MEDICATED_PAD | Freq: Every day | CUTANEOUS | Status: DC
  Administered 2013-08-14: 6 via TOPICAL

## 2013-08-14 MED ORDER — INSULIN ASPART 100 UNIT/ML ~~LOC~~ SOLN
20.0000 [IU] | Freq: Once | SUBCUTANEOUS | Status: AC
  Administered 2013-08-14: 20 [IU] via SUBCUTANEOUS

## 2013-08-14 MED ORDER — INSULIN ASPART 100 UNIT/ML ~~LOC~~ SOLN
0.0000 [IU] | SUBCUTANEOUS | Status: DC
  Administered 2013-08-14: 15 [IU] via SUBCUTANEOUS
  Administered 2013-08-14 (×2): 3 [IU] via SUBCUTANEOUS
  Administered 2013-08-14 – 2013-08-15 (×2): 15 [IU] via SUBCUTANEOUS
  Administered 2013-08-15: 3 [IU] via SUBCUTANEOUS
  Administered 2013-08-15 (×2): 5 [IU] via SUBCUTANEOUS

## 2013-08-14 MED ORDER — POTASSIUM CHLORIDE 10 MEQ/100ML IV SOLN
10.0000 meq | INTRAVENOUS | Status: AC
  Administered 2013-08-14 (×4): 10 meq via INTRAVENOUS
  Filled 2013-08-14 (×4): qty 100

## 2013-08-14 MED ORDER — SODIUM CHLORIDE 0.9 % IV SOLN
INTRAVENOUS | Status: DC
  Administered 2013-08-14: 1000 mL via INTRAVENOUS
  Administered 2013-08-15: 01:00:00 via INTRAVENOUS

## 2013-08-14 NOTE — Progress Notes (Signed)
Reviewed insulin administration with patient.

## 2013-08-14 NOTE — Significant Event (Signed)
CRITICAL VALUE ALERT  Critical value received:  Potassium 2.7  Date of notification:  08/14/13  Time of notification:  0525 Critical value read back:YES  Nurse who received alert:  Jeanmarie Plant RN  MD notified (1st page): Lenny Pastel NP  Time of first page:  [phone]  Responding MD:  Lenny Pastel NP  Time MD responded: [phone]

## 2013-08-14 NOTE — Progress Notes (Signed)
[facility] PROGRESS NOTE  Nicolas [name] WUJ:[id number] DOB: 29-Sep-1976 DOA: 08/12/2013 PCP: Hayden Rasmussen, NP  Assessment/Plan: DKA/ uncontrolled Type 2 diabetes mellitus -DKA resolved and patient transitioned to Lantus -Hemoglobin A1c is 10.6, he will require insulin on discharge -Continue monitoring Accu-Cheks and cover with sliding scale insulin -Consult for inpatient diabetes teaching patient also to follow up for teaching outpatient Hypertension -Continue outpatient medications Code Status:FULL Family Communication: Wife at bedside Disposition Plan: Transferred to MedSurg bed .To home when medically ready   Consultants:  None  Procedures:  NONE  Antibiotics:  NONE  HPI/Subjective: doing well today, tolerating by mouth as well denies chest pain no nausea or vomiting. Family at bedside  Objective: Filed Vitals:   08/14/13 0803  BP: 138/67  Pulse: 84  Temp: 98.6 F (37 C)  Resp: 30    Intake/Output Summary (Last 24 hours) at 08/14/13 0831 Last data filed at 08/14/13 0700  Gross per 24 hour  Intake 2484.17 ml  Output    200 ml  Net 2284.17 ml   Filed Weights   08/12/13 2112 08/13/13 0100 08/14/13 0438  Weight: 104.129 kg (229 lb 9 oz) 102.6 kg (226 lb 3.1 oz) 109.3 kg (240 lb 15.4 oz)    Exam:  General: alert & oriented x 3 In NAD Cardiovascular: RRR, nl S1 s2 Respiratory: CTAB Abdomen: soft +BS NT/ND, no masses palpable Extremities: No cyanosis and no edema    Data Reviewed: Basic Metabolic Panel:  Recent Labs Lab 08/13/13 0422 08/13/13 1445 08/13/13 1935 08/13/13 2310 08/14/13 0430  NA 146* 142 143 139 139  K 4.3 3.5 3.4* 3.2* 2.7*  CL 108 106 107 103 104  CO2 24 26 27 26 26   GLUCOSE 337* 339* 111* 143* 172*  BUN 22 17 16 15 14   CREATININE 1.25 1.24 1.21 1.21 1.22  CALCIUM 9.9 8.7 8.7 8.6 8.6   Liver Function Tests:  Recent Labs Lab 08/12/13 2116  AST 36  ALT 29  ALKPHOS 101  BILITOT 1.2  PROT 8.2  ALBUMIN 4.3   No  results found for this basename: LIPASE, AMYLASE,  in the last 168 hours No results found for this basename: AMMONIA,  in the last 168 hours CBC:  Recent Labs Lab 08/12/13 2116  WBC 15.2*  NEUTROABS 8.9*  HGB 11.6*  HCT 32.1*  MCV 75.9*  PLT 195   Cardiac Enzymes: No results found for this basename: CKTOTAL, CKMB, CKMBINDEX, TROPONINI,  in the last 168 hours BNP (last 3 results) No results found for this basename: PROBNP,  in the last 8760 hours CBG:  Recent Labs Lab 08/14/13 0216 08/14/13 0301 08/14/13 0416 08/14/13 0520 08/14/13 0638  GLUCAP 179* 184* 177* 171* 189*    Recent Results (from the past 240 hour(s))  MRSA PCR SCREENING     Status: Abnormal   Collection Time    08/13/13  1:58 AM      Result Value Range Status   MRSA by PCR POSITIVE (*) NEGATIVE Final   Comment:            The GeneXpert MRSA Assay (FDA     approved for NASAL specimens     only), is one component of a     comprehensive MRSA colonization     surveillance program. It is not     intended to diagnose MRSA     infection nor to guide or     monitor treatment for     MRSA infections.     RESULT  CALLED TO, READ BACK BY AND VERIFIED WITH:     CALLED TO RN [facility] HANCOCK [id number] @0510  THANEY     Studies: Dg Chest 2 View  08/12/2013   CLINICAL DATA:  DKAdka  EXAM: CHEST  2 VIEW  COMPARISON:  DG CHEST 2 VIEW dated 06/09/2012  FINDINGS: There are low lung volumes similar prior. Cardiac silhouette is normal. There is mild a perihilar linear opacities suggesting interstitial edema or pulmonary edema. No pneumothorax.  IMPRESSION: Low lung volumes with mild interstitial edema or pulmonary edema.   Electronically Signed   By: Genevive Bi M.D.   On: 08/12/2013 23:52    Scheduled Meds: . irbesartan  300 mg Oral Daily   And  . amLODipine  10 mg Oral Daily   And  . hydrochlorothiazide  25 mg Oral Daily  . carvedilol  6.25 mg Oral BID WC  . heparin  5,000 Units Subcutaneous Q8H  . insulin  aspart  0-15 Units Subcutaneous Q4H  . insulin glargine  10 Units Subcutaneous Daily  . potassium chloride  10 mEq Intravenous Q1 Hr x 4   Continuous Infusions: . sodium chloride 1,000 mL (08/14/13 0601)    Principal Problem:   DKA, type 2    Time spent: 35    [facility] C  [facility] Pager [phone]. If 7PM-7AM, please contact night-coverage at [email or web address], password [facility] 08/14/2013, 8:31 AM  LOS: 2 days

## 2013-08-14 NOTE — Progress Notes (Signed)
Cbg of 435 before supper.  MD notified. See order.

## 2013-08-15 LAB — BASIC METABOLIC PANEL
BUN: 10 mg/dL (ref 6–23)
CO2: 27 mEq/L (ref 19–32)
Calcium: 7.7 mg/dL — ABNORMAL LOW (ref 8.4–10.5)
Chloride: 102 mEq/L (ref 96–112)
Creatinine, Ser: 1.05 mg/dL (ref 0.50–1.35)
GFR calc Af Amer: >90 mL/min (ref >90)
GFR calc non Af Amer: 88 mL/min — ABNORMAL LOW (ref >90)
Glucose, Bld: 213 mg/dL — ABNORMAL HIGH (ref 70–99)
Potassium: 3.2 mEq/L — ABNORMAL LOW (ref 3.5–5.1)
Sodium: 137 mEq/L (ref 135–145)

## 2013-08-15 LAB — GLUCOSE, CAPILLARY
Glucose-Capillary: 199 mg/dL — ABNORMAL HIGH (ref 70–99)
Glucose-Capillary: 211 mg/dL — ABNORMAL HIGH (ref 70–99)
Glucose-Capillary: 221 mg/dL — ABNORMAL HIGH (ref 70–99)
Glucose-Capillary: 318 mg/dL — ABNORMAL HIGH (ref 70–99)
Glucose-Capillary: 376 mg/dL — ABNORMAL HIGH (ref 70–99)

## 2013-08-15 MED ORDER — GLIMEPIRIDE 2 MG PO TABS: 2.0000 mg | ORAL_TABLET | Freq: Every day | ORAL | Status: DC

## 2013-08-15 MED ORDER — GLUCOSE BLOOD VI STRP: ORAL_STRIP | Status: AC

## 2013-08-15 MED ORDER — GLIMEPIRIDE 4 MG PO TABS: 2.0000 mg | ORAL_TABLET | Freq: Every day | ORAL | Status: DC

## 2013-08-15 MED ORDER — GLIMEPIRIDE 2 MG PO TABS
2.0000 mg | ORAL_TABLET | Freq: Every day | ORAL | Status: DC
  Administered 2013-08-15: 2 mg via ORAL
  Filled 2013-08-15 (×2): qty 1

## 2013-08-15 MED ORDER — GLIMEPIRIDE 4 MG PO TABS: 4.0000 mg | ORAL_TABLET | Freq: Every day | ORAL | Status: DC

## 2013-08-15 MED ORDER — FREESTYLE SYSTEM KIT: PACK | Status: DC

## 2013-08-15 MED ORDER — INSULIN ASPART 100 UNIT/ML ~~LOC~~ SOLN
15.0000 [IU] | Freq: Once | SUBCUTANEOUS | Status: AC
  Administered 2013-08-15: 15 [IU] via SUBCUTANEOUS

## 2013-08-15 MED ORDER — INSULIN PEN NEEDLE 32G X 4 MM MISC: Status: DC

## 2013-08-15 MED ORDER — INSULIN GLARGINE 100 UNIT/ML ~~LOC~~ SOLN
20.0000 [IU] | Freq: Every day | SUBCUTANEOUS | Status: DC
  Administered 2013-08-15: 20 [IU] via SUBCUTANEOUS
  Filled 2013-08-15 (×2): qty 0.2

## 2013-08-15 MED ORDER — INSULIN GLARGINE 100 UNITS/ML SOLOSTAR PEN: 20.0000 [IU] | PEN_INJECTOR | Freq: Every day | SUBCUTANEOUS | Status: DC

## 2013-08-15 MED ORDER — INSULIN PEN STARTER KIT
1.0000 | Freq: Once | Status: AC
  Administered 2013-08-15: 1
  Filled 2013-08-15 (×3): qty 1

## 2013-08-15 MED ORDER — ACCU-CHEK MULTICLIX LANCETS MISC: Status: AC

## 2013-08-15 MED ORDER — POTASSIUM CHLORIDE CRYS ER 20 MEQ PO TBCR
40.0000 meq | EXTENDED_RELEASE_TABLET | ORAL | Status: AC
  Administered 2013-08-15 (×2): 40 meq via ORAL
  Filled 2013-08-15 (×2): qty 2

## 2013-08-15 NOTE — Progress Notes (Addendum)
Inpatient Diabetes Program Recommendations  AACE/ADA: New Consensus Statement on Inpatient Glycemic Control (2013)  Target Ranges:  Prepandial:   less than 140 mg/dL      Peak postprandial:   less than 180 mg/dL (1-2 hours)      Critically ill patients:  140 - 180 mg/dL   Reason for Visit: MD Referral due to for in-house and Outpatient Diabetes Education  Note:   Patient has history of type 2 diabetes, but patient states MD stopped meds about 3 years ago.  Current meter needs a new battery and not covered by Medicaid.  Will need insulin, daily Lantus, at discharge.  MD also wants patient and wife to attend Outpatient Diabetes Education after discharge.  Dietitian has seen patient, and patient does not think he needs another encounter prior to discharge.  Patient has received the Living with Diabetes booklet.  Has been giving some insulin injections.    Demonstrated use of insulin pen with patient.  Patient did return demonstration.  (States he used an insulin pen short-term about 5 years ago, but later was able to switch to Metformin.)  Patient would prefer Rx for Lantus SoloStar at discharge.  (Lantus Solostar pen is covered by Medicaid per website.)  Ordered an insulin pen starter kit for patient.  Patient's home meter needs a new battery and is not covered by Medicaid per patient.  Needs Rx for meter/supplies covered by Medicaid at discharge-- an AccuChek brand.  Patient has not watched any educational videos on Patient Ed Channel.  Nursing staff to direct him regarding access.  Also needs instruction regarding hypoglycemia s/s and Rx and any appropriate DM handouts related to self-care and medications.  Patient receptive to outpatient diabetes [facility].  Will place referral.  Patient will be contacted after discharge by phone to set appointment.  Thank you for the consult.  Hamlet Lasecki S. Elsie Lincoln, RN, CNS, CDE Inpatient Diabetes Program,  team pager [phone]    Addendum: Given patient's weight, patient would benefit from increase in Lantus dose.  0.2 units/kg would be 22 units daily and 0.3 units/kg would be 33 units daily.  Thank you.  Khush Pasion S. Elsie Lincoln, RN, CNS, CDE Inpatient Diabetes Program, team pager [phone]

## 2013-08-15 NOTE — Progress Notes (Signed)
Discharge home. Home discharge instruction given, DM education done. Lantus given prior to discharge,

## 2013-08-15 NOTE — Progress Notes (Signed)
MD made aware of patient blood sugar of 318. New order were given and noted.

## 2013-08-15 NOTE — Discharge Summary (Signed)
Physician Discharge Summary  Nicolas [name] JXB:[id number] DOB: 1975/12/09 DOA: 08/12/2013  PCP: Nicolas Rasmussen, NP  Admit date: 08/12/2013 Discharge date: 08/15/2013  Time spent: >30 minutes  Recommendations for Outpatient Follow-up:  Follow-up Information   Follow up with [name],DAVID, NP. (NEXT week, call for appt upon discharge)    Specialty:  Family Medicine   Contact information:   [address] [place] [address] [phone]        Discharge Diagnoses:  Principal Problem: DKA, type 2 HTN  Discharge Condition: improved/stable  Diet recommendation: modified carb  Filed Weights   08/12/13 2112 08/13/13 0100 08/14/13 0438  Weight: 104.129 kg (229 lb 9 oz) 102.6 kg (226 lb 3.1 oz) 109.3 kg (240 lb 15.4 oz)    History of present illness:  Nicolas [name] is a 37 y.o. male who presents to the ED with polyuria, polydipsia, dizziness, dry mouth, and nausea. Symptoms onset 2 weeks ago and have been worsening in that time period. States he has to get up 10 times a night to use the bathroom. Context: he has a h/o DM2 in the past, was on medications 3 years ago before they were stopped because his BGLs had been so good, has been on no DM meds since that time.  Lab work in ED showed BGL of 778, remainder of workup c/w DKA with anion gap of 22. He was admitted for further eval and management.     Hospital Course:  DKA/ uncontrolled Type 2 diabetes mellitus  -As discussed above, upon admission pt was placed on IV insulin via glucostabilizer. After DKA resolved  patient transitioned to Lantus as per protocol and his lantus dose was adjusted accordingly to improve BG control, amaryl was also added to his treatment regimen and BG control was improving and he was to follow up with PCP outpt. -his Hemoglobin A1c is 10.6, and so he was discharged on lantus along with oral hypoglycemic as already mentioned above.  - Accu-Chek monitoring was done and he was and covered with  sliding scale insulin  -Consult for inpatient diabetes teaching was done in the hospital and patient also to follow up for teaching outpatient  Hypertension  -he was to Continue outpatient medications   Procedures: -none Consultations:  none  Discharge Exam: Filed Vitals:   08/15/13 1700  BP: 121/85  Pulse: 83  Temp: 98.8 F (37.1 C)  Resp: 18    Exam:  General: alert & oriented x 3 In NAD  Cardiovascular: RRR, nl S1 s2  Respiratory: CTAB  Abdomen: soft +BS NT/ND, no masses palpable  Extremities: No cyanosis and no edema   Discharge Instructions  Discharge Orders   Future Orders Complete By Expires   Ambulatory referral to Nutrition and Diabetic Education  As directed    Comments:     Patient is married.  Hopefully wife can attend with patient.  Patient is bilingual-- [place] and [place].   Diet Carb Modified  As directed    Discharge instructions  As directed    Comments:     Check blood sugars befor meals and at bed time, keep log of sugars and take to PCP/MD for follow up appt   Increase activity slowly  As directed        Medication List         accu-chek multiclix lancets  Use as instructed     carvedilol 6.25 MG tablet  Commonly known as:  COREG  Take 6.25 mg by mouth 2 (two) times daily  with a meal.     glimepiride 4 MG tablet  Commonly known as:  AMARYL  Take 1 tablet (4 mg total) by mouth daily with breakfast.     glucose blood test strip  Use as instructed     glucose monitoring kit monitoring kit  Glucometer- ACCUCHECK BRAND     insulin glargine 100 units/mL Soln  Commonly known as:  LANTUS  Inject 20 Units into the skin at bedtime.  Start taking on:  08/16/2013     Insulin Pen Needle 32G X 4 MM Misc  Commonly known as:  INSUPEN PEN NEEDLES  Lantus solostar pen  SQ needles     TRIBENZOR 40-10-25 MG Tabs  Generic drug:  Olmesartan-Amlodipine-HCTZ  Take 1 tablet by mouth daily.       No Known Allergies     Follow-up  Information   Follow up with [name],DAVID, NP. (NEXT week, call for appt upon discharge)    Specialty:  Family Medicine   Contact information:   [address] [place] [address] [phone]        The results of significant diagnostics from this hospitalization (including imaging, microbiology, ancillary and laboratory) are listed below for reference.    Significant Diagnostic Studies: Dg Chest 2 View  08/12/2013   CLINICAL DATA:  DKAdka  EXAM: CHEST  2 VIEW  COMPARISON:  DG CHEST 2 VIEW dated 06/09/2012  FINDINGS: There are low lung volumes similar prior. Cardiac silhouette is normal. There is mild a perihilar linear opacities suggesting interstitial edema or pulmonary edema. No pneumothorax.  IMPRESSION: Low lung volumes with mild interstitial edema or pulmonary edema.   Electronically Signed   By: Nicolas [name] M.D.   On: 08/12/2013 23:52    Microbiology: Recent Results (from the past 240 hour(s))  URINE CULTURE     Status: None   Collection Time    08/12/13 11:13 PM      Result Value Range Status   Specimen Description URINE, CATHETERIZED   Final   Special Requests NONE   Final   Culture  Setup Time     Final   Value: 08/13/2013 01:33     Performed at [facility]   Culture     Final   Value: NO GROWTH     Performed at [facility]   Report Status 08/14/2013 FINAL   Final  MRSA PCR SCREENING     Status: Abnormal   Collection Time    08/13/13  1:58 AM      Result Value Range Status   MRSA by PCR POSITIVE (*) NEGATIVE Final   Comment:            The GeneXpert MRSA Assay (FDA     approved for NASAL specimens     only), is one component of a     comprehensive MRSA colonization     surveillance program. It is not     intended to diagnose MRSA     infection nor to guide or     monitor treatment for     MRSA infections.     RESULT CALLED TO, READ BACK BY AND VERIFIED WITH:     CALLED TO Nicolas [name] Nicolas [name] [id number] @0510  Nicolas [name]      Labs: Basic Metabolic Panel:  Recent Labs Lab 08/13/13 1935 08/13/13 2310 08/14/13 0430 08/14/13 0735 08/15/13 0621  NA 143 139 139 136 137  K 3.4* 3.2* 2.7* 3.9 3.2*  CL 107 103 104 104  102  CO2 27 26 26 19 27   GLUCOSE 111* 143* 172* 178* 213*  BUN 16 15 14 13 10   CREATININE 1.21 1.21 1.22 1.17 1.05  CALCIUM 8.7 8.6 8.6 8.2* 7.7*   Liver Function Tests:  Recent Labs Lab 08/12/13 2116  AST 36  ALT 29  ALKPHOS 101  BILITOT 1.2  PROT 8.2  ALBUMIN 4.3   No results found for this basename: LIPASE, AMYLASE,  in the last 168 hours No results found for this basename: AMMONIA,  in the last 168 hours CBC:  Recent Labs Lab 08/12/13 2116  WBC 15.2*  NEUTROABS 8.9*  HGB 11.6*  HCT 32.1*  MCV 75.9*  PLT 195   Cardiac Enzymes: No results found for this basename: CKTOTAL, CKMB, CKMBINDEX, TROPONINI,  in the last 168 hours BNP: BNP (last 3 results) No results found for this basename: PROBNP,  in the last 8760 hours CBG:  Recent Labs Lab 08/15/13 0012 08/15/13 0404 08/15/13 0806 08/15/13 1136 08/15/13 1614  GLUCAP 199* 221* 211* 376* 318*       Signed:  Delynn Pursley C  [facility] 08/15/2013, 6:17 PM

## 2013-10-28 DIAGNOSIS — I10 Essential (primary) hypertension: Secondary | ICD-10-CM | POA: Insufficient documentation

## 2013-10-28 DIAGNOSIS — Z794 Long term (current) use of insulin: Secondary | ICD-10-CM | POA: Insufficient documentation

## 2013-10-28 DIAGNOSIS — R079 Chest pain, unspecified: Secondary | ICD-10-CM | POA: Insufficient documentation

## 2013-10-28 DIAGNOSIS — Z79899 Other long term (current) drug therapy: Secondary | ICD-10-CM | POA: Insufficient documentation

## 2013-10-28 DIAGNOSIS — Z8739 Personal history of other diseases of the musculoskeletal system and connective tissue: Secondary | ICD-10-CM | POA: Insufficient documentation

## 2013-10-28 DIAGNOSIS — Z8659 Personal history of other mental and behavioral disorders: Secondary | ICD-10-CM | POA: Insufficient documentation

## 2013-10-29 ENCOUNTER — Encounter (HOSPITAL_COMMUNITY): Payer: Self-pay | Admitting: Emergency Medicine

## 2013-10-29 ENCOUNTER — Emergency Department (HOSPITAL_COMMUNITY): Payer: Medicaid Other

## 2013-10-29 ENCOUNTER — Emergency Department (HOSPITAL_COMMUNITY)
Admission: EM | Admit: 2013-10-29 | Discharge: 2013-10-29 | Disposition: A | Discharge status: Home / Self Care | Payer: Medicaid Other | Attending: Emergency Medicine | Admitting: Emergency Medicine

## 2013-10-29 DIAGNOSIS — R079 Chest pain, unspecified: Secondary | ICD-10-CM

## 2013-10-29 LAB — COMPREHENSIVE METABOLIC PANEL
ALT: 29 U/L (ref 0–53)
AST: 30 U/L (ref 0–37)
Albumin: 4.2 g/dL (ref 3.5–5.2)
Alkaline Phosphatase: 59 U/L (ref 39–117)
BUN: 18 mg/dL (ref 6–23)
CO2: 30 mEq/L (ref 19–32)
Calcium: 9.4 mg/dL (ref 8.4–10.5)
Chloride: 99 mEq/L (ref 96–112)
Creatinine, Ser: 1.1 mg/dL (ref 0.50–1.35)
GFR calc Af Amer: >90 mL/min (ref >90)
GFR calc non Af Amer: 82 mL/min — ABNORMAL LOW (ref >90)
Glucose, Bld: 121 mg/dL — ABNORMAL HIGH (ref 70–99)
Potassium: 3.4 mEq/L — ABNORMAL LOW (ref 3.7–5.3)
Sodium: 144 mEq/L (ref 137–147)
Total Bilirubin: 0.7 mg/dL (ref 0.3–1.2)
Total Protein: 8.1 g/dL (ref 6.0–8.3)

## 2013-10-29 LAB — CBC WITH DIFFERENTIAL/PLATELET
Basophils Absolute: 0 10*3/uL (ref 0.0–0.1)
Basophils Relative: 0 % (ref 0–1)
Eosinophils Absolute: 0 10*3/uL (ref 0.0–0.7)
Eosinophils Relative: 0 % (ref 0–5)
HCT: 36.7 % — ABNORMAL LOW (ref 39.0–52.0)
Hemoglobin: 13.4 g/dL (ref 13.0–17.0)
Lymphocytes Relative: 24 % (ref 12–46)
Lymphs Abs: 2.8 10*3/uL (ref 0.7–4.0)
MCH: 29.1 pg (ref 26.0–34.0)
MCHC: 36.5 g/dL — ABNORMAL HIGH (ref 30.0–36.0)
MCV: 79.6 fL (ref 78.0–100.0)
Monocytes Absolute: 1 10*3/uL (ref 0.1–1.0)
Monocytes Relative: 9 % (ref 3–12)
Neutro Abs: 8 10*3/uL — ABNORMAL HIGH (ref 1.7–7.7)
Neutrophils Relative %: 68 % (ref 43–77)
Platelets: 315 10*3/uL (ref 150–400)
RBC: 4.61 MIL/uL (ref 4.22–5.81)
RDW: 17.6 % — ABNORMAL HIGH (ref 11.5–15.5)
WBC: 11.8 10*3/uL — ABNORMAL HIGH (ref 4.0–10.5)

## 2013-10-29 LAB — TROPONIN I: Troponin I: <0.3 ng/mL (ref <0.30)

## 2013-10-29 LAB — POCT I-STAT TROPONIN I: Troponin i, poc: 0 ng/mL (ref 0.00–0.08)

## 2013-10-29 MED ORDER — ASPIRIN 81 MG PO CHEW
324.0000 mg | CHEWABLE_TABLET | Freq: Once | ORAL | Status: AC
  Administered 2013-10-29: 324 mg via ORAL
  Filled 2013-10-29: qty 4

## 2013-10-29 MED ORDER — ONDANSETRON HCL 4 MG/2ML IJ SOLN
4.0000 mg | Freq: Once | INTRAMUSCULAR | Status: AC
  Administered 2013-10-29: 4 mg via INTRAVENOUS
  Filled 2013-10-29: qty 2

## 2013-10-29 MED ORDER — ASPIRIN 81 MG PO CHEW: 81.0000 mg | CHEWABLE_TABLET | Freq: Every day | ORAL | Status: DC

## 2013-10-29 MED ORDER — NITROGLYCERIN 0.4 MG SL SUBL: 0.4000 mg | SUBLINGUAL_TABLET | SUBLINGUAL | Status: DC | PRN

## 2013-10-29 MED ORDER — MORPHINE SULFATE 4 MG/ML IJ SOLN
4.0000 mg | Freq: Once | INTRAMUSCULAR | Status: AC
Start: 2013-10-29 — End: 2013-10-29
  Administered 2013-10-29: 4 mg via INTRAVENOUS
  Filled 2013-10-29: qty 1

## 2013-10-29 NOTE — ED Notes (Signed)
Pt came to the ED stating that he has centralized chest pain since last night. No radiation. No n/v . No SOB. Pain is 7 out of 10. No respiratory distress. Will continue to monitor.

## 2013-10-29 NOTE — ED Provider Notes (Signed)
CSN: [id number]     Arrival date & time 10/28/13  2358 History   First MD Initiated Contact with Patient 10/29/13 [phone]     Chief Complaint  Patient presents with  . Chest Pain   (Consider location/radiation/quality/duration/timing/severity/associated sxs/prior Treatment) HPI History provided by patient. Left sided chest pain described as soreness, onset yesterday morning and constant since onset. Nothing seems to make it better or make it worse. No associated shortness of breath. No cough. No fevers or chills. No trauma. No rash. No leg pain or leg swelling. He denies any history of tobacco use or known history of heart problems. He is treated for diabetes and hypertension. No history of similar symptoms in the past. Past Medical History  Diagnosis Date  . Hypertension   . Arthritis     arthritis- back & L hip  . Depression     situational depression, pt. out of work   . Osteoarthritis of left hip 06/07/2012   Past Surgical History  Procedure Laterality Date  . Joint replacement      R hip  . Left hip  06/07/2012  . Total hip arthroplasty  06/07/2012    Procedure: TOTAL HIP ARTHROPLASTY;  Surgeon: Johnny Bridge, MD;  Location: [facility];  Service: Orthopedics;  Laterality: Left;   No family history on file. History  Substance Use Topics  . Smoking status: Never Smoker   . Smokeless tobacco: Never Used  . Alcohol Use: No    Review of Systems  Constitutional: Negative for fever and chills.  Respiratory: Negative for shortness of breath.   Cardiovascular: Positive for chest pain.  Gastrointestinal: Negative for abdominal pain.  Genitourinary: Negative for flank pain.  Musculoskeletal: Negative for back pain, neck pain and neck stiffness.  Skin: Negative for rash.  Neurological: Negative for headaches.  All other systems reviewed and are negative.    Allergies  Review of patient's allergies indicates no known allergies.  Home Medications   Current Outpatient Rx  Name   Route  Sig  Dispense  Refill  . carvedilol (COREG) 6.25 MG tablet   Oral   Take 6.25 mg by mouth 2 (two) times daily with a meal.         . glimepiride (AMARYL) 4 MG tablet   Oral   Take 1 tablet (4 mg total) by mouth daily with breakfast.   30 tablet   0   . glucose blood test strip      Use as instructed   100 each   12     accuchecks   . glucose monitoring kit (FREESTYLE) monitoring kit      Glucometer- ACCUCHECK BRAND   1 each   0   . insulin glargine (LANTUS) 100 units/mL SOLN   Subcutaneous   Inject 20 Units into the skin at bedtime.   10 mL   0   . Insulin Pen Needle (INSUPEN PEN NEEDLES) 32G X 4 MM MISC      Lantus solostar pen  SQ needles   100 each   12   . Lancets (ACCU-CHEK MULTICLIX) lancets      Use as instructed   100 each   12   . Olmesartan-Amlodipine-HCTZ (TRIBENZOR) 40-10-25 MG TABS   Oral   Take 1 tablet by mouth daily.          BP 150/98  Pulse 62  Temp(Src) 97.9 F (36.6 C) (Oral)  Resp 14  Ht '5\' 8"'  (1.727 m)  Wt 248 lb 6  oz (112.662 kg)  BMI 37.77 kg/m2  SpO2 97% Physical Exam  Constitutional: He is oriented to person, place, and time. He appears well-developed and well-nourished.  HENT:  Head: Normocephalic and atraumatic.  Eyes: Conjunctivae and EOM are normal. Pupils are equal, round, and reactive to light.  Neck: Trachea normal. Neck supple. No thyromegaly present.  Cardiovascular: Normal rate, regular rhythm, S1 normal, S2 normal and normal pulses.     No systolic murmur is present   No diastolic murmur is present  Pulses:      Radial pulses are 2+ on the right side, and 2+ on the left side.  Pulmonary/Chest: Effort normal and breath sounds normal. He has no wheezes. He has no rhonchi. He has no rales. He exhibits no tenderness.  Abdominal: Soft. Normal appearance and bowel sounds are normal. There is no tenderness. There is no CVA tenderness and negative Murphy's sign.  Musculoskeletal:  calves nontender, no  cords or erythema, negative Homans sign  Neurological: He is alert and oriented to person, place, and time. He has normal strength. No cranial nerve deficit or sensory deficit. GCS eye subscore is 4. GCS verbal subscore is 5. GCS motor subscore is 6.  Skin: Skin is warm and dry. No rash noted. He is not diaphoretic.  Psychiatric: His speech is normal.    ED Course  Procedures (including critical care time) Labs Review Labs Reviewed  CBC WITH DIFFERENTIAL - Abnormal; Notable for the following:    WBC 11.8 (*)    HCT 36.7 (*)    MCHC 36.5 (*)    RDW 17.6 (*)    Neutro Abs 8.0 (*)    All other components within normal limits  COMPREHENSIVE METABOLIC PANEL - Abnormal; Notable for the following:    Potassium 3.4 (*)    Glucose, Bld 121 (*)    GFR calc non Af Amer 82 (*)    All other components within normal limits  TROPONIN I   Imaging Review Dg Chest 2 View  10/29/2013   CLINICAL DATA:  Chest pain  EXAM: CHEST  2 VIEW  COMPARISON:  08/12/2013  FINDINGS: Mild cardiomegaly, stable from prior. Upper mediastinal contours unchanged. No infiltrate or edema. No effusion or pneumothorax.  Osseous findings suggest sickle cell disease, including bilateral humeral head osteonecrosis and diffuse endplate collapse.  IMPRESSION: No active cardiopulmonary disease.   Electronically Signed   By: Jorje Guild M.D.   On: 10/29/2013 01:24    EKG Interpretation    Date/Time:  Saturday October 29 2013 00:07:34 EST Ventricular Rate:  63 PR Interval:  172 QRS Duration: 98 QT Interval:  364 QTC Calculation: 372 R Axis:   18 Text Interpretation:  Normal sinus rhythm with sinus arrhythmia Nonspecific ST abnormality Abnormal ECG Confirmed by Eiliana Drone  MD, Angelynn Lemus ([id number]) on 10/29/2013 4:16:35 AM           Aspirin. Nitroglycerin. IV morphine provided.  On recheck, the pain resolved and patient feeling much better. Serial troponins negative. Plan discharge home and followup with cardiology for outpatient  stress testing. Patient states understanding all discharge and followup instructions and strict return precautions. He agrees to take aspirin daily. MDM  Diagnosis: Chest pain  Low risk for ACS - risk factors include diabetes and hypertension Evaluated with EKG, troponins and chest x-ray reviewed as above Pain resolved with medications provided Serial evaluations improved condition Vial signs and nursing notes reviewed and considered    Teressa Lower, MD 10/29/13 [phone]

## 2013-10-29 NOTE — ED Notes (Signed)
Nurse First Rounds: Nurse updated pt. on wait time ,explained delay and process to pt. , no pain at this time / respirations unlabored .

## 2013-10-29 NOTE — Discharge Instructions (Signed)
Chest Pain (Nonspecific) °It is often hard to give a specific diagnosis for the cause of chest pain. There is always a chance that your pain could be related to something serious, such as a heart attack or a blood clot in the lungs. You need to follow up with your caregiver for further evaluation. °CAUSES  °· Heartburn. °· Pneumonia or bronchitis. °· Anxiety or stress. °· Inflammation around your heart (pericarditis) or lung (pleuritis or pleurisy). °· A blood clot in the lung. °· A collapsed lung (pneumothorax). It can develop suddenly on its own (spontaneous pneumothorax) or from injury (trauma) to the chest. °· Shingles infection (herpes zoster virus). °The chest wall is composed of bones, muscles, and cartilage. Any of these can be the source of the pain. °· The bones can be bruised by injury. °· The muscles or cartilage can be strained by coughing or overwork. °· The cartilage can be affected by inflammation and become sore (costochondritis). °DIAGNOSIS  °Lab tests or other studies, such as X-rays, electrocardiography, stress testing, or cardiac imaging, may be needed to find the cause of your pain.  °TREATMENT  °· Treatment depends on what may be causing your chest pain. Treatment may include: °· Acid blockers for heartburn. °· Anti-inflammatory medicine. °· Pain medicine for inflammatory conditions. °· Antibiotics if an infection is present. °· You may be advised to change lifestyle habits. This includes stopping smoking and avoiding alcohol, caffeine, and chocolate. °· You may be advised to keep your head raised (elevated) when sleeping. This reduces the chance of acid going backward from your stomach into your esophagus. °· Most of the time, nonspecific chest pain will improve within 2 to 3 days with rest and mild pain medicine. °HOME CARE INSTRUCTIONS  °· If antibiotics were prescribed, take your antibiotics as directed. Finish them even if you start to feel better. °· For the next few days, avoid physical  activities that bring on chest pain. Continue physical activities as directed. °· Do not smoke. °· Avoid drinking alcohol. °· Only take over-the-counter or prescription medicine for pain, discomfort, or fever as directed by your caregiver. °· Follow your caregiver's suggestions for further testing if your chest pain does not go away. °· Keep any follow-up appointments you made. If you do not go to an appointment, you could develop lasting (chronic) problems with pain. If there is any problem keeping an appointment, you must call to reschedule. °SEEK MEDICAL CARE IF:  °· You think you are having problems from the medicine you are taking. Read your medicine instructions carefully. °· Your chest pain does not go away, even after treatment. °· You develop a rash with blisters on your chest. °SEEK IMMEDIATE MEDICAL CARE IF:  °· You have increased chest pain or pain that spreads to your arm, neck, jaw, back, or abdomen. °· You develop shortness of breath, an increasing cough, or you are coughing up blood. °· You have severe back or abdominal pain, feel nauseous, or vomit. °· You develop severe weakness, fainting, or chills. °· You have a fever. °THIS IS AN EMERGENCY. Do not wait to see if the pain will go away. Get medical help at once. Call your local emergency services (911 in [place] Do not drive yourself to the hospital. °MAKE SURE YOU:  °· Understand these instructions. °· Will watch your condition. °· Will get help right away if you are not doing well or get worse. °Document Released: 06/25/2005 Document Revised: 12/08/2011 Document Reviewed: 04/20/2008 °ExitCare® Patient Information ©2014 [employer]   [employer]. ° °

## 2013-10-29 NOTE — ED Notes (Signed)
The pt is c/o mid-chest pain today and he feels like he is breathing fast.  No history of the same

## 2013-10-29 NOTE — ED Notes (Signed)
Pt made aware that he cannot drive home. Stated family was waiting for him in waiting room.

## 2013-11-03 ENCOUNTER — Encounter: Payer: Self-pay | Admitting: Cardiovascular Disease

## 2013-11-03 ENCOUNTER — Ambulatory Visit (INDEPENDENT_AMBULATORY_CARE_PROVIDER_SITE_OTHER): Payer: Medicaid Other | Admitting: Cardiovascular Disease

## 2013-11-03 VITALS — BP 174/94 | HR 98 | Ht 70.0 in | Wt 244.0 lb

## 2013-11-03 DIAGNOSIS — R079 Chest pain, unspecified: Secondary | ICD-10-CM

## 2013-11-03 DIAGNOSIS — M545 Low back pain, unspecified: Secondary | ICD-10-CM | POA: Insufficient documentation

## 2013-11-03 DIAGNOSIS — I10 Essential (primary) hypertension: Secondary | ICD-10-CM

## 2013-11-03 DIAGNOSIS — E119 Type 2 diabetes mellitus without complications: Secondary | ICD-10-CM

## 2013-11-03 NOTE — Patient Instructions (Signed)
Your physician recommends that you schedule a follow-up appointment in: 3-4 weeks.   Your physician has requested that you have an echocardiogram. Echocardiography is a painless test that uses sound waves to create images of your heart. It provides your doctor with information about the size and shape of your heart and how well your heart's chambers and valves are working. This procedure takes approximately one hour. There are no restrictions for this procedure.   Your physician has requested that you have an exercise stress myoview. For further information please visit [email or web address]. Please follow instruction sheet, as given.

## 2013-11-03 NOTE — Progress Notes (Signed)
History of Present Illness: 38 yo male with history of HTN, OA, depression, DM here today for evaluation of chest pain. He was seen in the ED at [facility] 10/11/13 with constant left sided chest pain. CXR was ok. Troponin was negative. No ischemic changes on EKG. He tells me that over the last two weeks he has mild pressure in the center of his chest. It seems to be most severe in the am. It may worsen with position changes. No associated SOB, nausea, diaphoresis. No prior chest pain similar to this. Onset was two weeks ago. Seems to wax and wane in intensity. Not associated with meals.   Primary Care Physician: Janne Napoleon  Past Medical History  Diagnosis Date  . Hypertension   . Arthritis     arthritis- back & L hip  . Depression     situational depression, pt. out of work   . Osteoarthritis of left hip 06/07/2012  . Diabetes mellitus     Past Surgical History  Procedure Laterality Date  . Joint replacement      R hip  . 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 aspirin 81 MG chewable tablet Chew 1 tablet (81 mg total) by mouth daily.  30 tablet  0  . cyclobenzaprine (FLEXERIL) 10 MG tablet Take 10 mg by mouth 3 (three) times daily as needed for muscle spasms.      Marland Kitchen glimepiride (AMARYL) 4 MG tablet Take 1 tablet (4 mg total) by mouth daily with breakfast.  30 tablet  0  . glucose blood test strip Use as instructed  100 each  12  . glucose monitoring kit (FREESTYLE) monitoring kit Glucometer- ACCUCHECK BRAND  1 each  0  . insulin glargine (LANTUS) 100 units/mL SOLN Inject 20 Units into the skin at bedtime.  10 mL  0  . Insulin Pen Needle (INSUPEN PEN NEEDLES) 32G X 4 MM MISC Lantus solostar pen  SQ needles  100 each  12  . Lancets (ACCU-CHEK MULTICLIX) lancets Use as instructed  100 each  12  .  olmesartan (BENICAR) 20 MG tablet Take 20 mg by mouth daily.       No current facility-administered medications for this visit.    No Known Allergies  History   Social History  . Marital Status: Married    Spouse Name: N/A    Number of Children: 2  . Years of Education: N/A   Occupational History  . Disabled    Social History Main Topics  . Smoking status: Never Smoker   . Smokeless tobacco: Never Used  . Alcohol Use: 0.5 oz/week    1 drink(s) per week  . Drug Use: No  . Sexual Activity: Yes   Other Topics Concern  . Not on file   Social History Narrative  . No narrative on file    Family History  Problem Relation Age of Onset  . CAD Neg Hx   . Cancer - Other Father     Review of Systems:  As stated in the HPI and otherwise negative.   BP 174/94  Pulse 98  Ht '5\' 10"'  (1.778 m)  Wt 244 lb (110.678 kg)  BMI 35.01 kg/m2  Physical Examination: General: Well developed, well nourished, NAD HEENT: OP clear, mucus membranes moist SKIN:  warm, dry. No rashes. Neuro: No focal deficits Musculoskeletal: Muscle strength 5/5 all ext Psychiatric: Mood and affect normal Neck: No JVD, no carotid bruits, no thyromegaly, no lymphadenopathy. Lungs:Clear bilaterally, no wheezes, rhonci, crackles Cardiovascular: Regular rate and rhythm. No murmurs, gallops or rubs. Abdomen:Soft. Bowel sounds present. Non-tender.  Extremities: No lower extremity edema. Pulses are 2 + in the bilateral DP/PT.  EKG: NSR, rate 98 bpm. Possible old inferior infarct.   Assessment and Plan:   1. Chest pain: His pain has typical and atypical features. His risk factors for CAD include HTN, obesity and DM. Will arrange exercise stress myoview to exclude ischemia. Will arrange echo to exclude structural heart defects, exclude pericardial effusion.   2. HTN: BP elevated today. This is managed in primary care.   3. Diabetes mellitus: Followed in primary care.

## 2013-11-11 ENCOUNTER — Ambulatory Visit (HOSPITAL_COMMUNITY): Payer: Medicaid Other | Attending: Cardiovascular Disease | Admitting: Cardiology

## 2013-11-11 ENCOUNTER — Other Ambulatory Visit (HOSPITAL_COMMUNITY): Payer: Self-pay

## 2013-11-11 DIAGNOSIS — R079 Chest pain, unspecified: Secondary | ICD-10-CM | POA: Insufficient documentation

## 2013-11-11 DIAGNOSIS — I059 Rheumatic mitral valve disease, unspecified: Secondary | ICD-10-CM | POA: Insufficient documentation

## 2013-11-11 DIAGNOSIS — E119 Type 2 diabetes mellitus without complications: Secondary | ICD-10-CM | POA: Insufficient documentation

## 2013-11-11 DIAGNOSIS — I10 Essential (primary) hypertension: Secondary | ICD-10-CM | POA: Insufficient documentation

## 2013-11-11 NOTE — Progress Notes (Signed)
Echo performed. 

## 2013-11-21 ENCOUNTER — Encounter (HOSPITAL_COMMUNITY): Payer: Self-pay

## 2013-11-24 ENCOUNTER — Ambulatory Visit: Payer: Self-pay | Admitting: Cardiovascular Disease

## 2014-01-11 ENCOUNTER — Emergency Department (HOSPITAL_COMMUNITY): Payer: Medicaid Other

## 2014-01-11 ENCOUNTER — Encounter (HOSPITAL_COMMUNITY): Payer: Self-pay | Admitting: Emergency Medicine

## 2014-01-11 ENCOUNTER — Emergency Department (HOSPITAL_COMMUNITY)
Admission: EM | Admit: 2014-01-11 | Discharge: 2014-01-11 | Disposition: A | Discharge status: Home / Self Care | Payer: Medicaid Other | Attending: Emergency Medicine | Admitting: Emergency Medicine

## 2014-01-11 DIAGNOSIS — I10 Essential (primary) hypertension: Secondary | ICD-10-CM | POA: Insufficient documentation

## 2014-01-11 DIAGNOSIS — E119 Type 2 diabetes mellitus without complications: Secondary | ICD-10-CM | POA: Insufficient documentation

## 2014-01-11 DIAGNOSIS — G8921 Chronic pain due to trauma: Secondary | ICD-10-CM | POA: Insufficient documentation

## 2014-01-11 DIAGNOSIS — Z8739 Personal history of other diseases of the musculoskeletal system and connective tissue: Secondary | ICD-10-CM | POA: Insufficient documentation

## 2014-01-11 DIAGNOSIS — Z79899 Other long term (current) drug therapy: Secondary | ICD-10-CM | POA: Insufficient documentation

## 2014-01-11 DIAGNOSIS — Z9889 Other specified postprocedural states: Secondary | ICD-10-CM | POA: Insufficient documentation

## 2014-01-11 DIAGNOSIS — Z794 Long term (current) use of insulin: Secondary | ICD-10-CM | POA: Insufficient documentation

## 2014-01-11 DIAGNOSIS — M545 Low back pain, unspecified: Secondary | ICD-10-CM | POA: Insufficient documentation

## 2014-01-11 DIAGNOSIS — Z8659 Personal history of other mental and behavioral disorders: Secondary | ICD-10-CM | POA: Insufficient documentation

## 2014-01-11 LAB — CBC WITH DIFFERENTIAL/PLATELET
Basophils Absolute: 0 10*3/uL (ref 0.0–0.1)
Basophils Relative: 0 % (ref 0–1)
Eosinophils Absolute: 0.2 10*3/uL (ref 0.0–0.7)
Eosinophils Relative: 2 % (ref 0–5)
HCT: 36 % — ABNORMAL LOW (ref 39.0–52.0)
Hemoglobin: 13.1 g/dL (ref 13.0–17.0)
Lymphocytes Relative: 44 % (ref 12–46)
Lymphs Abs: 4 10*3/uL (ref 0.7–4.0)
MCH: 28.7 pg (ref 26.0–34.0)
MCHC: 36.4 g/dL — ABNORMAL HIGH (ref 30.0–36.0)
MCV: 78.9 fL (ref 78.0–100.0)
Monocytes Absolute: 0.8 10*3/uL (ref 0.1–1.0)
Monocytes Relative: 8 % (ref 3–12)
Neutro Abs: 4.2 10*3/uL (ref 1.7–7.7)
Neutrophils Relative %: 46 % (ref 43–77)
Platelets: 303 10*3/uL (ref 150–400)
RBC: 4.56 MIL/uL (ref 4.22–5.81)
RDW: 17.5 % — ABNORMAL HIGH (ref 11.5–15.5)
WBC: 9.2 10*3/uL (ref 4.0–10.5)

## 2014-01-11 LAB — COMPREHENSIVE METABOLIC PANEL
ALT: 30 U/L (ref 0–53)
AST: 35 U/L (ref 0–37)
Albumin: 4.5 g/dL (ref 3.5–5.2)
Alkaline Phosphatase: 57 U/L (ref 39–117)
BUN: 11 mg/dL (ref 6–23)
CO2: 28 mEq/L (ref 19–32)
Calcium: 9.2 mg/dL (ref 8.4–10.5)
Chloride: 98 mEq/L (ref 96–112)
Creatinine, Ser: 0.93 mg/dL (ref 0.50–1.35)
GFR calc Af Amer: >90 mL/min (ref >90)
GFR calc non Af Amer: >90 mL/min (ref >90)
Glucose, Bld: 92 mg/dL (ref 70–99)
Potassium: 3.3 mEq/L — ABNORMAL LOW (ref 3.7–5.3)
Sodium: 143 mEq/L (ref 137–147)
Total Bilirubin: 0.7 mg/dL (ref 0.3–1.2)
Total Protein: 7.9 g/dL (ref 6.0–8.3)

## 2014-01-11 MED ORDER — HYDROMORPHONE HCL PF 1 MG/ML IJ SOLN: 0.5000 mg | Freq: Once | INTRAMUSCULAR | Status: DC

## 2014-01-11 MED ORDER — OXYCODONE-ACETAMINOPHEN 5-325 MG PO TABS: 1.0000 | ORAL_TABLET | ORAL | Status: DC | PRN

## 2014-01-11 MED ORDER — HYDROMORPHONE HCL PF 1 MG/ML IJ SOLN
1.0000 mg | Freq: Once | INTRAMUSCULAR | Status: AC
  Administered 2014-01-11: 1 mg via INTRAMUSCULAR
  Filled 2014-01-11: qty 1

## 2014-01-11 MED ORDER — OXYCODONE-ACETAMINOPHEN 5-325 MG PO TABS
1.0000 | ORAL_TABLET | Freq: Once | ORAL | Status: AC
  Administered 2014-01-11: 1 via ORAL
  Filled 2014-01-11: qty 1

## 2014-01-11 MED ORDER — HYDROMORPHONE HCL PF 1 MG/ML IJ SOLN
1.0000 mg | Freq: Once | INTRAMUSCULAR | Status: AC
  Administered 2014-01-11: 1 mg via INTRAVENOUS
  Filled 2014-01-11: qty 1

## 2014-01-11 NOTE — ED Notes (Signed)
Pt states he hurt his back at work 2 days ago.  Pt has gotten worse.  Pt ambulatory without assistance.  Pt in NAD at this time.

## 2014-01-11 NOTE — ED Provider Notes (Signed)
Pt with possible sickle cell disease c/o of chronic intermittent back pain and now also having shoulder pain which he relates to a cause by the job.  Pt has normal Hb of 13 and  Low suspicion for crisis at this time.  Will d/c home with pain control.  Gwyneth SproutWhitney Whitaker Holderman, MD 01/11/14 [phone]

## 2014-01-11 NOTE — ED Notes (Signed)
MD at bedside. 

## 2014-01-11 NOTE — ED Provider Notes (Signed)
Medical screening examination/treatment/procedure(s) were conducted as a shared visit with non-physician practitioner(s) and myself.  I personally evaluated the patient during the encounter.   EKG Interpretation None        Gwyneth SproutWhitney Alyissa Whidbee, MD 01/11/14 [phone]

## 2014-01-11 NOTE — Discharge Instructions (Signed)
Back Pain, Adult Low back pain is very common. About 1 in 5 people have back pain.The cause of low back pain is rarely dangerous. The pain often gets better over time.About half of people with a sudden onset of back pain feel better in just 2 weeks. About 8 in 10 people feel better by 6 weeks.  CAUSES Some common causes of back pain include:  Strain of the muscles or ligaments supporting the spine.  Wear and tear (degeneration) of the spinal discs.  Arthritis.  Direct injury to the back. DIAGNOSIS Most of the time, the direct cause of low back pain is not known.However, back pain can be treated effectively even when the exact cause of the pain is unknown.Answering your caregiver's questions about your overall health and symptoms is one of the most accurate ways to make sure the cause of your pain is not dangerous. If your caregiver needs more information, he or she may order lab work or imaging tests (X-rays or MRIs).However, even if imaging tests show changes in your back, this usually does not require surgery. HOME CARE INSTRUCTIONS For many people, back pain returns.Since low back pain is rarely dangerous, it is often a condition that people can learn to manageon their own.   Remain active. It is stressful on the back to sit or stand in one place. Do not sit, drive, or stand in one place for more than 30 minutes at a time. Take short walks on level surfaces as soon as pain allows.Try to increase the length of time you walk each day.  Do not stay in bed.Resting more than 1 or 2 days can delay your recovery.  Do not avoid exercise or work.Your body is made to move.It is not dangerous to be active, even though your back may hurt.Your back will likely heal faster if you return to being active before your pain is gone.  Pay attention to your body when you bend and lift. Many people have less discomfortwhen lifting if they bend their knees, keep the load close to their bodies,and  avoid twisting. Often, the most comfortable positions are those that put less stress on your recovering back.  Find a comfortable position to sleep. Use a firm mattress and lie on your side with your knees slightly bent. If you lie on your back, put a pillow under your knees.  Only take over-the-counter or prescription medicines as directed by your caregiver. Over-the-counter medicines to reduce pain and inflammation are often the most helpful.Your caregiver may prescribe muscle relaxant drugs.These medicines help dull your pain so you can more quickly return to your normal activities and healthy exercise.  Put ice on the injured area.  Put ice in a plastic bag.  Place a towel between your skin and the bag.  Leave the ice on for 15-20 minutes, 03-04 times a day for the first 2 to 3 days. After that, ice and heat may be alternated to reduce pain and spasms.  Ask your caregiver about trying back exercises and gentle massage. This may be of some benefit.  Avoid feeling anxious or stressed.Stress increases muscle tension and can worsen back pain.It is important to recognize when you are anxious or stressed and learn ways to manage it.Exercise is a great option. SEEK MEDICAL CARE IF:  You have pain that is not relieved with rest or medicine.  You have pain that does not improve in 1 week.  You have new symptoms.  You are generally not feeling well. SEEK   IMMEDIATE MEDICAL CARE IF:   You have pain that radiates from your back into your legs.  You develop new bowel or bladder control problems.  You have unusual weakness or numbness in your arms or legs.  You develop nausea or vomiting.  You develop abdominal pain.  You feel faint. Document Released: 09/15/2005 Document Revised: 03/16/2012 Document Reviewed: 02/03/2011 ExitCare Patient Information 2014 [employer].  

## 2014-01-11 NOTE — ED Provider Notes (Signed)
CSN: [id number]     Arrival date & time 01/11/14  1342 History   First MD Initiated Contact with Patient 01/11/14 1555     Chief Complaint  Patient presents with  . Back Pain     (Consider location/radiation/quality/duration/timing/severity/associated sxs/prior Treatment) Patient is a 38 y.o. male presenting with back pain. The history is provided by the patient. No language interpreter was used.  Back Pain Associated symptoms: no chest pain, no fever, no numbness and no weakness   Nicolas [name] is a 38 y/o M with PMHx of HTN, arthritis, depression, OA, DM to the ED with back pain that started a couple of days ago localized to the lower back described as a constant throbbing, aching pain without radiation. Patient reported that he has been taking Oxycodone with minimal relief. 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 Denied chest pain, shortness of breath, difficulty breathing, numbness, tingling, headaches, dizziness.  PCP Dr. Marcha Dutton  Past Medical History  Diagnosis Date  . Hypertension   . Arthritis     arthritis- back & L hip  . Depression     situational depression, pt. out of work   . Osteoarthritis of left hip 06/07/2012  . Diabetes mellitus    Past Surgical History  Procedure Laterality Date  . Joint replacement      R hip  . Total hip arthroplasty  06/07/2012    Procedure: TOTAL HIP ARTHROPLASTY;  Surgeon: Johnny Bridge, MD;  Location: [place];  Service: Orthopedics;  Laterality: Left;   Family History  Problem Relation Age of Onset  . CAD Neg Hx   . Cancer - Other Father    History  Substance Use Topics  . Smoking status: Never Smoker   . Smokeless tobacco: Never Used  . Alcohol Use: 0.5 oz/week    1 drink(s) per week    Review of Systems  Constitutional: Negative for fever and chills.  Eyes: Negative for visual disturbance.  Respiratory: Negative for chest tightness and  shortness of breath.   Cardiovascular: Negative for chest pain.  Musculoskeletal: Positive for back pain. Negative for neck pain.  Neurological: Negative for weakness and numbness.  All other systems reviewed and are negative.     Allergies  Review of patient's allergies indicates no known allergies.  Home Medications   Prior to Admission medications   Medication Sig Start Date End Date Taking? Authorizing Provider  amLODipine-olmesartan (AZOR) 10-40 MG per tablet Take 1 tablet by mouth daily.   Yes Historical Provider, MD  cyclobenzaprine (FLEXERIL) 10 MG tablet Take 10 mg by mouth 3 (three) times daily as needed for muscle spasms.   Yes Historical Provider, MD  metFORMIN (GLUCOPHAGE) 500 MG tablet Take 500 mg by mouth 2 (two) times daily with a meal.   Yes Historical Provider, MD  glucose blood test strip Use as instructed 08/15/13   Sheila Oats, MD  glucose monitoring kit (FREESTYLE) monitoring kit Glucometer- ACCUCHECK BRAND 08/15/13   Adeline Saralyn Pilar, MD  Insulin Pen Needle (INSUPEN PEN NEEDLES) 32G X 4 MM MISC Lantus solostar pen  SQ needles 08/15/13   Adeline C Viyuoh, MD  Lancets (ACCU-CHEK MULTICLIX) lancets Use as instructed 08/15/13   Adeline C Viyuoh, MD   BP 159/101  Pulse 87  Temp(Src) 98.1 F (36.7 C) (Oral)  Resp 18  SpO2 97% Physical Exam  Nursing note and vitals  reviewed. Constitutional: He is oriented to person, place, and time. He appears well-developed and well-nourished. No distress.  HENT:  Head: Normocephalic and atraumatic.  Mouth/Throat: Oropharynx is clear and moist. No oropharyngeal exudate.  Eyes: Conjunctivae and EOM are normal. Pupils are equal, round, and reactive to light. Right eye exhibits no discharge. Left eye exhibits no discharge.  Neck: Normal range of motion. Neck supple. No tracheal deviation present.  Cardiovascular: Normal rate, regular rhythm and normal heart sounds.  Exam reveals no friction rub.   No murmur heard. Pulses:       Radial pulses are 2+ on the right side, and 2+ on the left side.       Dorsalis pedis pulses are 2+ on the right side, and 2+ on the left side.  Pulmonary/Chest: Effort normal and breath sounds normal. No respiratory distress. He has no wheezes. He has no rales.  Musculoskeletal: Normal range of motion. He exhibits tenderness.  Negative swelling, erythema, inflammation, lesions, sores noted to the cervical/thoracic/lumbosacral spine - discomfort upon palpation to the lumbosacral spine and paraspinal regions bilaterally.  Full ROM to upper and lower extremities without difficulty noted, negative ataxia noted.  Lymphadenopathy:    He has no cervical adenopathy.  Neurological: He is alert and oriented to person, place, and time. No cranial nerve deficit. He exhibits normal muscle tone. Coordination normal.  Cranial nerves III-XII grossly intact Strength 5+/5+ to upper and lower extremities bilaterally with resistance applied, equal distribution noted Gait proper, proper balance - negative sway, negative drift, negative step-offs  Skin: Skin is warm and dry. No rash noted. He is not diaphoretic. No erythema.  Psychiatric: He has a normal mood and affect. His behavior is normal. Thought content normal.    ED Course  Procedures (including critical care time)  6:45 PM Upon about to discharge the patient reported that he had sickle cell anemia. Discussed with Dr. Maryan Rued - file reviewed with negative findings or filing of the condition in the charts that were reviewed. Recommended IM Dilaudid to be administered and Chem-8 to be obtained - reported that if Hgb low patient to be brought to the main ED.   Labs Review Labs Reviewed  I-STAT CHEM 8, ED    Imaging Review Dg Lumbar Spine Complete  01/11/2014   CLINICAL DATA:  Injury 2013 with worsening pain.  EXAM: LUMBAR SPINE - COMPLETE 4+ VIEW  COMPARISON:  Hip 06/07/2012.  FINDINGS: Vertebral body alignment and heights are normal. There is mild  spondylosis throughout the lumbar spine. There is minimal disc space narrowing at the L2-3 level. There is no compression fracture or subluxation. Subtle stable sclerosis over the sacroiliac joints. Bilateral total hip arthroplasties are unchanged.  IMPRESSION: No acute findings.  Mild spondylosis of the lumbar spine with minimal disc disease at the L2-3 level.   Electronically Signed   By: Marin Olp M.D.   On: 01/11/2014 17:43     EKG Interpretation None      MDM   Final diagnoses:  None   Medications  HYDROmorphone (DILAUDID) injection 1 mg (not administered)  oxyCODONE-acetaminophen (PERCOCET/ROXICET) 5-325 MG per tablet 1 tablet (1 tablet Oral Given 01/11/14 1603)   Filed Vitals:   01/11/14 1401 01/11/14 1903  BP: 157/91 159/101  Pulse: 90 87  Temp: 98.1 F (36.7 C)   TempSrc: Oral   Resp: 18 18  SpO2: 96% 97%     Nicolas [name] is a 38 y/o M with PMHx of HTN, arthritis, depression, OA, DM, sickle  cell anemia presenting to the ED with back pain that started a couple of days ago localized to the lower back described as a constant throbbing, aching pain. Reported that when he has pain crisis the pain localizes in his lower back. Patient reported that he has been taking Oxycodone with minimal relief. 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 Reported that his children have the sickle cell trait and stated that his mother sickle cell anemia. Denied chest pain, shortness of breath, difficulty breathing, numbness, tingling. This provider reviewed the patient's chart. Patient was admitted to the hospital at one point and there was no identification that the patient has sickle cell anemia. Patient reported that he is followed by his PCP regarding sickle cell anemia. Stated that his mother had it and that his children have the trait.  Alert and oriented. GCS 15. Heart rate and rhythm normal.  Lungs clear to auscultation to upper and lower lobes bilaterally. Patient is able to speak in full sentences without difficulty - negative use of accessory muscles. Radial and DP pulses 2+ bilaterally. Full ROM to upper and lower extremities bilaterally. Discomfort upon palpation to the midpsinal region of the lumbosacral spine with no deformities or crepitus noted. Strength intact with equal distribution.  Discussed case with Dr. Purcell Mouton regarding alleged sickle cell anemia findings - as per physician recommended Chem-8 to be obtained with Dilaudid IM to be administered.  Discussed case with Dr. Purcell Mouton - transfer of care to Dr. Purcell Mouton at change in shift.   Jamse Mead, PA-C 01/11/14 1941

## 2014-02-16 ENCOUNTER — Telehealth: Payer: Self-pay | Admitting: General Practice

## 2014-02-16 NOTE — Telephone Encounter (Signed)
Left message for patient to call to schedule appointment

## 2014-07-26 DIAGNOSIS — D5720 Sickle-cell/Hb-C disease without crisis: Secondary | ICD-10-CM | POA: Insufficient documentation

## 2014-07-26 DIAGNOSIS — I119 Hypertensive heart disease without heart failure: Secondary | ICD-10-CM | POA: Diagnosis present

## 2014-07-26 DIAGNOSIS — E669 Obesity, unspecified: Secondary | ICD-10-CM | POA: Insufficient documentation

## 2014-08-01 DIAGNOSIS — G8929 Other chronic pain: Secondary | ICD-10-CM | POA: Insufficient documentation

## 2014-08-02 DIAGNOSIS — E559 Vitamin D deficiency, unspecified: Secondary | ICD-10-CM | POA: Insufficient documentation

## 2014-11-09 DIAGNOSIS — R809 Proteinuria, unspecified: Secondary | ICD-10-CM | POA: Insufficient documentation

## 2014-12-29 ENCOUNTER — Encounter (HOSPITAL_COMMUNITY): Payer: Self-pay

## 2014-12-29 ENCOUNTER — Emergency Department (HOSPITAL_COMMUNITY)
Admission: EM | Admit: 2014-12-29 | Discharge: 2014-12-29 | Disposition: A | Discharge status: Home / Self Care | Payer: Medicaid Other | Attending: Emergency Medicine | Admitting: Emergency Medicine

## 2014-12-29 DIAGNOSIS — M549 Dorsalgia, unspecified: Secondary | ICD-10-CM

## 2014-12-29 DIAGNOSIS — E119 Type 2 diabetes mellitus without complications: Secondary | ICD-10-CM | POA: Diagnosis not present

## 2014-12-29 DIAGNOSIS — Z79899 Other long term (current) drug therapy: Secondary | ICD-10-CM | POA: Diagnosis not present

## 2014-12-29 DIAGNOSIS — I10 Essential (primary) hypertension: Secondary | ICD-10-CM | POA: Diagnosis not present

## 2014-12-29 DIAGNOSIS — Z794 Long term (current) use of insulin: Secondary | ICD-10-CM | POA: Insufficient documentation

## 2014-12-29 DIAGNOSIS — S199XXA Unspecified injury of neck, initial encounter: Secondary | ICD-10-CM | POA: Insufficient documentation

## 2014-12-29 DIAGNOSIS — Y92410 Unspecified street and highway as the place of occurrence of the external cause: Secondary | ICD-10-CM | POA: Diagnosis not present

## 2014-12-29 DIAGNOSIS — M542 Cervicalgia: Secondary | ICD-10-CM

## 2014-12-29 DIAGNOSIS — Z8659 Personal history of other mental and behavioral disorders: Secondary | ICD-10-CM | POA: Diagnosis not present

## 2014-12-29 DIAGNOSIS — Y998 Other external cause status: Secondary | ICD-10-CM | POA: Insufficient documentation

## 2014-12-29 DIAGNOSIS — S3992XA Unspecified injury of lower back, initial encounter: Secondary | ICD-10-CM | POA: Diagnosis present

## 2014-12-29 DIAGNOSIS — Y9389 Activity, other specified: Secondary | ICD-10-CM | POA: Diagnosis not present

## 2014-12-29 DIAGNOSIS — Z8739 Personal history of other diseases of the musculoskeletal system and connective tissue: Secondary | ICD-10-CM | POA: Diagnosis not present

## 2014-12-29 MED ORDER — DIAZEPAM 5 MG PO TABS: 5.0000 mg | ORAL_TABLET | Freq: Three times a day (TID) | ORAL | Status: DC | PRN
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
## 2015-01-11 ENCOUNTER — Encounter (HOSPITAL_COMMUNITY): Payer: Self-pay | Admitting: *Deleted

## 2015-01-11 ENCOUNTER — Emergency Department (HOSPITAL_COMMUNITY)
Admission: EM | Admit: 2015-01-11 | Discharge: 2015-01-11 | Disposition: A | Discharge status: Home / Self Care | Payer: Medicaid Other | Attending: Emergency Medicine | Admitting: Emergency Medicine

## 2015-01-11 DIAGNOSIS — Z8659 Personal history of other mental and behavioral disorders: Secondary | ICD-10-CM | POA: Insufficient documentation

## 2015-01-11 DIAGNOSIS — M5416 Radiculopathy, lumbar region: Secondary | ICD-10-CM | POA: Insufficient documentation

## 2015-01-11 DIAGNOSIS — E119 Type 2 diabetes mellitus without complications: Secondary | ICD-10-CM | POA: Diagnosis not present

## 2015-01-11 DIAGNOSIS — Z79899 Other long term (current) drug therapy: Secondary | ICD-10-CM | POA: Diagnosis not present

## 2015-01-11 DIAGNOSIS — I10 Essential (primary) hypertension: Secondary | ICD-10-CM | POA: Insufficient documentation

## 2015-01-11 DIAGNOSIS — Z794 Long term (current) use of insulin: Secondary | ICD-10-CM | POA: Diagnosis not present

## 2015-01-11 DIAGNOSIS — M79604 Pain in right leg: Secondary | ICD-10-CM | POA: Diagnosis present

## 2015-01-11 MED ORDER — ORPHENADRINE CITRATE ER 100 MG PO TB12: 100.0000 mg | ORAL_TABLET | Freq: Two times a day (BID) | ORAL | Status: DC

## 2015-01-11 MED ORDER — PREDNISONE 20 MG PO TABS: ORAL_TABLET | ORAL | Status: DC
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
## 2015-01-11 NOTE — Discharge Instructions (Signed)
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
## 2015-05-10 ENCOUNTER — Emergency Department (HOSPITAL_COMMUNITY): Payer: Medicaid Other

## 2015-05-10 ENCOUNTER — Inpatient Hospital Stay (HOSPITAL_COMMUNITY)
Admission: EM | Admit: 2015-05-10 | Discharge: 2015-05-12 | DRG: 869 | Disposition: A | Discharge status: Home / Self Care | Payer: Medicaid Other | Attending: Internal Medicine | Admitting: Internal Medicine

## 2015-05-10 ENCOUNTER — Encounter (HOSPITAL_COMMUNITY): Payer: Self-pay | Admitting: Emergency Medicine

## 2015-05-10 DIAGNOSIS — I10 Essential (primary) hypertension: Secondary | ICD-10-CM | POA: Diagnosis present

## 2015-05-10 DIAGNOSIS — E119 Type 2 diabetes mellitus without complications: Secondary | ICD-10-CM | POA: Diagnosis present

## 2015-05-10 DIAGNOSIS — E876 Hypokalemia: Secondary | ICD-10-CM | POA: Diagnosis present

## 2015-05-10 DIAGNOSIS — D649 Anemia, unspecified: Secondary | ICD-10-CM | POA: Diagnosis not present

## 2015-05-10 DIAGNOSIS — D638 Anemia in other chronic diseases classified elsewhere: Secondary | ICD-10-CM | POA: Diagnosis present

## 2015-05-10 DIAGNOSIS — R7989 Other specified abnormal findings of blood chemistry: Secondary | ICD-10-CM | POA: Diagnosis present

## 2015-05-10 DIAGNOSIS — B54 Unspecified malaria: Secondary | ICD-10-CM

## 2015-05-10 DIAGNOSIS — R740 Nonspecific elevation of levels of transaminase and lactic acid dehydrogenase [LDH]: Secondary | ICD-10-CM | POA: Diagnosis present

## 2015-05-10 DIAGNOSIS — I119 Hypertensive heart disease without heart failure: Secondary | ICD-10-CM | POA: Diagnosis present

## 2015-05-10 DIAGNOSIS — E860 Dehydration: Secondary | ICD-10-CM | POA: Diagnosis not present

## 2015-05-10 DIAGNOSIS — D72829 Elevated white blood cell count, unspecified: Secondary | ICD-10-CM | POA: Diagnosis present

## 2015-05-10 DIAGNOSIS — B509 Plasmodium falciparum malaria, unspecified: Principal | ICD-10-CM | POA: Diagnosis present

## 2015-05-10 DIAGNOSIS — E1122 Type 2 diabetes mellitus with diabetic chronic kidney disease: Secondary | ICD-10-CM

## 2015-05-10 DIAGNOSIS — R945 Abnormal results of liver function studies: Secondary | ICD-10-CM | POA: Diagnosis present

## 2015-05-10 DIAGNOSIS — N1831 Type 2 diabetes mellitus with diabetic chronic kidney disease: Secondary | ICD-10-CM

## 2015-05-10 DIAGNOSIS — Z96641 Presence of right artificial hip joint: Secondary | ICD-10-CM | POA: Diagnosis present

## 2015-05-10 HISTORY — DX: Essential (primary) hypertension: I10

## 2015-05-10 LAB — CBC WITH DIFFERENTIAL/PLATELET
Basophils Absolute: 0 10*3/uL (ref 0.0–0.1)
Basophils Relative: 0 % (ref 0–1)
Eosinophils Absolute: 0.1 10*3/uL (ref 0.0–0.7)
Eosinophils Relative: 1 % (ref 0–5)
HCT: 34.5 % — ABNORMAL LOW (ref 39.0–52.0)
Hemoglobin: 12.3 g/dL — ABNORMAL LOW (ref 13.0–17.0)
Lymphocytes Relative: 12 % (ref 12–46)
Lymphs Abs: 1.5 10*3/uL (ref 0.7–4.0)
MCH: 28.3 pg (ref 26.0–34.0)
MCHC: 35.7 g/dL (ref 30.0–36.0)
MCV: 79.5 fL (ref 78.0–100.0)
Monocytes Absolute: 0.9 10*3/uL (ref 0.1–1.0)
Monocytes Relative: 7 % (ref 3–12)
Neutro Abs: 10.2 10*3/uL — ABNORMAL HIGH (ref 1.7–7.7)
Neutrophils Relative %: 80 % — ABNORMAL HIGH (ref 43–77)
Platelets: 278 10*3/uL (ref 150–400)
RBC: 4.34 MIL/uL (ref 4.22–5.81)
RDW: 17.1 % — ABNORMAL HIGH (ref 11.5–15.5)
WBC: 12.7 10*3/uL — ABNORMAL HIGH (ref 4.0–10.5)

## 2015-05-10 LAB — COMPREHENSIVE METABOLIC PANEL
ALT: 64 U/L — ABNORMAL HIGH (ref 17–63)
AST: 72 U/L — ABNORMAL HIGH (ref 15–41)
Albumin: 4.2 g/dL (ref 3.5–5.0)
Alkaline Phosphatase: 59 U/L (ref 38–126)
Anion gap: 12 (ref 5–15)
BUN: 14 mg/dL (ref 6–20)
CO2: 28 mmol/L (ref 22–32)
Calcium: 9.3 mg/dL (ref 8.9–10.3)
Chloride: 100 mmol/L — ABNORMAL LOW (ref 101–111)
Creatinine, Ser: 1.3 mg/dL — ABNORMAL HIGH (ref 0.61–1.24)
GFR calc Af Amer: >60 mL/min (ref >60)
GFR calc non Af Amer: >60 mL/min (ref >60)
Glucose, Bld: 113 mg/dL — ABNORMAL HIGH (ref 65–99)
Potassium: 3.2 mmol/L — ABNORMAL LOW (ref 3.5–5.1)
Sodium: 140 mmol/L (ref 135–145)
Total Bilirubin: 2 mg/dL — ABNORMAL HIGH (ref 0.3–1.2)
Total Protein: 7.7 g/dL (ref 6.5–8.1)

## 2015-05-10 LAB — GLUCOSE, CAPILLARY: Glucose-Capillary: 117 mg/dL — ABNORMAL HIGH (ref 65–99)

## 2015-05-10 LAB — SAVE SMEAR

## 2015-05-10 LAB — I-STAT CG4 LACTIC ACID, ED: Lactic Acid, Venous: 2.41 mmol/L (ref 0.5–2.0)

## 2015-05-10 LAB — MAGNESIUM: Magnesium: 1.7 mg/dL (ref 1.7–2.4)

## 2015-05-10 MED ORDER — CHLOROQUINE PHOSPHATE 500 MG PO TABS
500.0000 mg | ORAL_TABLET | Freq: Once | ORAL | Status: AC
  Administered 2015-05-11: 500 mg via ORAL
  Filled 2015-05-10: qty 1

## 2015-05-10 MED ORDER — INSULIN ASPART 100 UNIT/ML ~~LOC~~ SOLN
0.0000 [IU] | Freq: Three times a day (TID) | SUBCUTANEOUS | Status: DC
  Administered 2015-05-11 (×2): 2 [IU] via SUBCUTANEOUS

## 2015-05-10 MED ORDER — ENOXAPARIN SODIUM 40 MG/0.4ML ~~LOC~~ SOLN
40.0000 mg | SUBCUTANEOUS | Status: DC
  Administered 2015-05-10 – 2015-05-11 (×2): 40 mg via SUBCUTANEOUS
  Filled 2015-05-10 (×2): qty 0.4

## 2015-05-10 MED ORDER — DOCUSATE SODIUM 100 MG PO CAPS
100.0000 mg | ORAL_CAPSULE | Freq: Two times a day (BID) | ORAL | Status: DC
  Administered 2015-05-10 – 2015-05-12 (×4): 100 mg via ORAL
  Filled 2015-05-10 (×4): qty 1

## 2015-05-10 MED ORDER — ACETAMINOPHEN 325 MG PO TABS
650.0000 mg | ORAL_TABLET | Freq: Four times a day (QID) | ORAL | Status: DC | PRN
  Administered 2015-05-10 – 2015-05-11 (×3): 650 mg via ORAL
  Filled 2015-05-10 (×3): qty 2

## 2015-05-10 MED ORDER — OXYCODONE HCL 5 MG PO TABS: 5.0000 mg | ORAL_TABLET | ORAL | Status: DC | PRN

## 2015-05-10 MED ORDER — SODIUM CHLORIDE 0.9 % IV BOLUS (SEPSIS)
1000.0000 mL | Freq: Once | INTRAVENOUS | Status: AC
  Administered 2015-05-10: 1000 mL via INTRAVENOUS

## 2015-05-10 MED ORDER — ACETAMINOPHEN 325 MG PO TABS
ORAL_TABLET | ORAL | Status: AC
Start: 2015-05-10 — End: 2015-05-11
  Filled 2015-05-10: qty 2

## 2015-05-10 MED ORDER — ACETAMINOPHEN 325 MG PO TABS
650.0000 mg | ORAL_TABLET | Freq: Once | ORAL | Status: AC | PRN
  Administered 2015-05-10: 650 mg via ORAL

## 2015-05-10 MED ORDER — POTASSIUM CHLORIDE CRYS ER 20 MEQ PO TBCR
40.0000 meq | EXTENDED_RELEASE_TABLET | ORAL | Status: AC
  Administered 2015-05-10 (×2): 40 meq via ORAL
  Filled 2015-05-10 (×2): qty 2

## 2015-05-10 MED ORDER — LEVALBUTEROL HCL 0.63 MG/3ML IN NEBU: 0.6300 mg | INHALATION_SOLUTION | RESPIRATORY_TRACT | Status: DC | PRN

## 2015-05-10 MED ORDER — ACETAMINOPHEN 650 MG RE SUPP: 650.0000 mg | Freq: Four times a day (QID) | RECTAL | Status: DC | PRN

## 2015-05-10 MED ORDER — SORBITOL 70 % SOLN
30.0000 mL | Freq: Every day | Status: DC | PRN
  Filled 2015-05-10: qty 30

## 2015-05-10 MED ORDER — CARVEDILOL 12.5 MG PO TABS
12.5000 mg | ORAL_TABLET | Freq: Two times a day (BID) | ORAL | Status: DC
  Administered 2015-05-10 – 2015-05-12 (×4): 12.5 mg via ORAL
  Filled 2015-05-10 (×4): qty 1

## 2015-05-10 MED ORDER — SODIUM CHLORIDE 0.9 % IV SOLN
INTRAVENOUS | Status: AC
  Administered 2015-05-10: 20:00:00 via INTRAVENOUS

## 2015-05-10 MED ORDER — PRIMAQUINE PHOSPHATE 26.3 MG PO TABS
30.0000 mg | ORAL_TABLET | Freq: Every day | ORAL | Status: DC
  Administered 2015-05-10: 30 mg via ORAL
  Filled 2015-05-10 (×2): qty 2

## 2015-05-10 MED ORDER — POLYETHYLENE GLYCOL 3350 17 G PO PACK: 17.0000 g | PACK | Freq: Every day | ORAL | Status: DC | PRN

## 2015-05-10 MED ORDER — SODIUM CHLORIDE 0.9 % IV SOLN
INTRAVENOUS | Status: DC
  Filled 2015-05-10: qty 1000

## 2015-05-10 MED ORDER — ATOVAQUONE-PROGUANIL HCL 250-100 MG PO TABS
4.0000 | ORAL_TABLET | Freq: Every day | ORAL | Status: DC
  Administered 2015-05-10 – 2015-05-11 (×2): 4 via ORAL
  Filled 2015-05-10 (×4): qty 4

## 2015-05-10 MED ORDER — ONDANSETRON HCL 4 MG PO TABS: 4.0000 mg | ORAL_TABLET | Freq: Four times a day (QID) | ORAL | Status: DC | PRN

## 2015-05-10 MED ORDER — ONDANSETRON HCL 4 MG/2ML IJ SOLN: 4.0000 mg | Freq: Four times a day (QID) | INTRAMUSCULAR | Status: DC | PRN

## 2015-05-10 MED ORDER — CHLOROQUINE PHOSPHATE 500 MG PO TABS
500.0000 mg | ORAL_TABLET | ORAL | Status: DC
Start: 2015-05-11 — End: 2015-05-11
  Filled 2015-05-10: qty 1

## 2015-05-10 MED ORDER — MAGNESIUM SULFATE 2 GM/50ML IV SOLN
2.0000 g | Freq: Once | INTRAVENOUS | Status: AC
  Administered 2015-05-10: 2 g via INTRAVENOUS
  Filled 2015-05-10: qty 50

## 2015-05-10 MED ORDER — FOLIC ACID 1 MG PO TABS
1.0000 mg | ORAL_TABLET | Freq: Every day | ORAL | Status: DC
  Administered 2015-05-10 – 2015-05-12 (×3): 1 mg via ORAL
  Filled 2015-05-10 (×3): qty 1

## 2015-05-10 MED ORDER — CHLOROQUINE PHOSPHATE 500 MG PO TABS
1000.0000 mg | ORAL_TABLET | ORAL | Status: AC
Start: 2015-05-10 — End: 2015-05-10
  Administered 2015-05-10: 1000 mg via ORAL
  Filled 2015-05-10: qty 2

## 2015-05-10 NOTE — H&P (Signed)
[facility] History and Physical  Nicolas [name] LNL:[id number] DOB: 08/26/1976 DOA: 05/10/2015  Referring physician: Dr. Alvino Chapel PCP: Nicolas Greenland, MD   Chief Complaint: Fever  HPI: Nicolas Dinovo is a 39 y.o. male  With history of diabetes mellitus, hypertension, depression who presents to the ED with 2-3 days of fever. Patient stated that he took a trip to [place] in [place] and recently returned from the 2 weeks prior to admission. Patient was in [place] for about 6 weeks. 2 weeks into his travels he developed fevers that lasted around 3 days. Patient stated he got some medication from the pharmacy over-the-counter which she can't quite remember what it was however felt better. Patient stated that he returned from his trip 2 weeks ago was doing fine however 2-3 days prior to admission he noted that he had a fever at night to some Aleve and was doing okay. One day prior to admission was doing fine in the morning however and after no started to develop a fever and on the morning of admission developed fevers. Patient also developed some chills, generalized weakness, dehydration, nausea. Patient denies any emesis no headaches no visual changes no cough no sore throat no chest pain no shortness of breath no diarrhea no constipation no hematemesis no hematochezia no melanoma. Patient was seen in the emergency room comprehensive metabolic profile obtained at a potassium of 3.2 chloride of 100 creatinine of 1.3 glucose of 113 AST of 72 ALT of 64 bilirubin of 2.0 otherwise is within normal limits. Lactic acid was 2.41. CBC had a white count of 12.7 hemoglobin of 12.3 otherwise was within normal limits. Smear was sent. Chest x-ray obtained was negative. Per ED physician for full smear was concerning for malaria. [facility] were called to admit the patient for further evaluation and management.   Review of Systems: As per history of present illness otherwise negative.  Constitutional:  No weight  loss, night sweats, Fevers, chills, fatigue.  HEENT:  No headaches, Difficulty swallowing,Tooth/dental problems,Sore throat,  No sneezing, itching, ear ache, nasal congestion, post nasal drip,  Cardio-vascular:  No chest pain, Orthopnea, PND, swelling in lower extremities, anasarca, dizziness, palpitations  GI:  No heartburn, indigestion, abdominal pain, nausea, vomiting, diarrhea, change in bowel habits, loss of appetite  Resp:  No shortness of breath with exertion or at rest. No excess mucus, no productive cough, No non-productive cough, No coughing up of blood.No change in color of mucus.No wheezing.No chest wall deformity  Skin:  no rash or lesions.  GU:  no dysuria, change in color of urine, no urgency or frequency. No flank pain.  Musculoskeletal:  No joint pain or swelling. No decreased range of motion. No back pain.  Psych:  No change in mood or affect. No depression or anxiety. No memory loss.   Past Medical History  Diagnosis Date  . Hypertension   . Arthritis     arthritis- back & L hip  . Depression     situational depression, pt. out of work   . Osteoarthritis of left hip 06/07/2012  . Diabetes mellitus   . HTN (hypertension) 05/10/2015   Past Surgical History  Procedure Laterality Date  . Joint replacement      R hip  . 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 He has never used smokeless  tobacco. He reports that he drinks about 0.5 oz of alcohol per week. He reports that he does not use illicit drugs.  No Known Allergies  Family History  Problem Relation Age of Onset  . CAD Neg Hx   . Cancer - Other Father    patient states mother died giving childbirth. Father died at age 66 from liver cancer.  Prior to Admission medications   Medication Sig Start Date End Date Taking? Authorizing  Provider  amLODipine (NORVASC) 10 MG tablet Take 10 mg by mouth daily. 11/25/14  Yes Historical Provider, MD  carvedilol (COREG) 12.5 MG tablet Take 12.5 mg by mouth 2 (two) times daily. 04/17/15  Yes Historical Provider, MD  folic acid (FOLVITE) 1 MG tablet Take 1 mg by mouth daily. 12/23/14  Yes Historical Provider, MD  glucose blood test strip Use as instructed Patient taking differently: 1 each by Other route every other day. Use as instructed 08/15/13  Yes Adeline C Viyuoh, MD  glucose monitoring kit (FREESTYLE) monitoring kit Glucometer- ACCUCHECK BRAND 08/15/13  Yes Adeline C Viyuoh, MD  JANUVIA 100 MG tablet Take 100 mg by mouth daily. 02/08/15  Yes Historical Provider, MD  Lancets (ACCU-CHEK MULTICLIX) lancets Use as instructed Patient taking differently: 1 each by Other route every other day.  08/15/13  Yes Adeline C Viyuoh, MD  lisinopril (PRINIVIL,ZESTRIL) 5 MG tablet Take 5 mg by mouth daily. 04/17/15  Yes Historical Provider, MD  metFORMIN (GLUCOPHAGE) 500 MG tablet Take 500 mg by mouth 2 (two) times daily with a meal.   Yes Historical Provider, MD  orphenadrine (NORFLEX) 100 MG tablet Take 1 tablet (100 mg total) by mouth 2 (two) times daily. 01/11/15  Yes Charlesetta Shanks, MD  triamterene-hydrochlorothiazide (MAXZIDE-25) 37.5-25 MG per tablet Take 1 tablet by mouth daily. 12/30/14  Yes Historical Provider, MD   Physical Exam: Filed Vitals:   05/10/15 1630 05/10/15 1645 05/10/15 1715 05/10/15 1745  BP: 131/74 134/79  135/73  Pulse: 104 102  108  Temp:   102.4 F (39.1 C)   TempSrc:   Oral   Resp: 28 32  30  Height:      Weight:      SpO2: 97% 96%  95%    Wt Readings from Last 3 Encounters:  05/10/15 108.863 kg (240 lb)  01/11/15 108.863 kg (240 lb)  12/29/14 108.863 kg (240 lb)    General:   well-developed well-nourished laying on gurney in no acute cardiopulmonary distress. Speaking in full sentences.  Eyes: PERRLA, EOMI, normal lids, irises & conjunctiva ENT: grossly  normal hearing, lips & tongue Neck: no LAD, masses or thyromegaly Cardiovascular: Tachycardia, no m/r/g. No LE edema. Telemetry: SR, no arrhythmias  Respiratory: CTA bilaterally, no w/r/r. Normal respiratory effort. Abdomen: soft, ntnd Skin: no rash or induration seen on limited exam Musculoskeletal: grossly normal tone BUE/BLE Psychiatric: grossly normal mood and affect, speech fluent and appropriate Neurologic:  alert and oriented 3. Cranial nerves II through XII are grossly intact. No focal deficits.           Labs on Admission:  Basic Metabolic Panel:  Recent Labs Lab 05/10/15 1529 05/10/15 1725  NA 140  --   K 3.2*  --   CL 100*  --   CO2 28  --   GLUCOSE 113*  --   BUN 14  --   CREATININE 1.30*  --   CALCIUM 9.3  --   MG  --  1.7   Liver Function Tests:  Recent Labs Lab 05/10/15 1529  AST 72*  ALT 64*  ALKPHOS 59  BILITOT 2.0*  PROT 7.7  ALBUMIN 4.2   No results for input(s): LIPASE, AMYLASE in the last 168 hours. No results for input(s): AMMONIA in the last 168 hours. CBC:  Recent Labs Lab 05/10/15 1529  WBC 12.7*  NEUTROABS 10.2*  HGB 12.3*  HCT 34.5*  MCV 79.5  PLT 278   Cardiac Enzymes: No results for input(s): CKTOTAL, CKMB, CKMBINDEX, TROPONINI in the last 168 hours.  BNP (last 3 results) No results for input(s): BNP in the last 8760 hours.  ProBNP (last 3 results) No results for input(s): PROBNP in the last 8760 hours.  CBG: No results for input(s): GLUCAP in the last 168 hours.  Radiological Exams on Admission: Dg Chest 2 View  05/10/2015   CLINICAL DATA:  Fever for 2 days.  EXAM: CHEST  2 VIEW  COMPARISON:  PA and lateral chest 10/29/2013 and 08/12/2013.  FINDINGS: Lung volumes are low but the lungs are clear. Heart size is normal. There is no pneumothorax or pleural effusion. No focal bony abnormality is identified.  IMPRESSION: No acute finding in a low volume chest.   Electronically Signed   By: Inge Rise M.D.   On:  05/10/2015 15:54    EKG: None  Assessment/Plan Principal Problem:   Malaria Active Problems:   Diabetes type 2, controlled   Hypokalemia   Dehydration   Leukocytosis   HTN (hypertension)   Elevated LFTs  #1 Probable Malaria Patient with recent travel to [place], presents with fevers, chills, smear concerning for malaria. Will admit the patient. Placed on IV fluids. Check an acute hepatitis panel. Check HIV. Supportive care. Place on chloroquine, primaquine, malarone. Supportive care. Consult with ID for further evaluation and management.  #2 dehydration IV fluids.  #3 hypokalemia Likely secondary to diuretics. Check a magnesium level. Replete.  #4 leukocytosis Likely a reactive leukocytosis. Chest x-ray is negative. Check a UA with cultures and sensitivities. Follow for now.  #5 hypertension Continue home dose Coreg. Hold Maxzide and Norvasc. Follow.  #6 elevated LFTs Likely secondary to problem #1. Check an acute hepatitis panel. Follow.   #7 diabetes mellitus II Check a hemoglobin A1c. Hold oral hypoglycemic agents. Place on sliding scale insulin.  #8 prophylaxis Lovenox for DVT prophylaxis.  Code Status: Full DVT Prophylaxis: Lovenox. Family Communication: Updated patient. No family present. Disposition Plan: Admit to observation.  Time spent: 54 minutes  Vansh Reckart M.D. [facility] Pager [phone]

## 2015-05-10 NOTE — ED Notes (Signed)
Pt returned to this country 2 weeks ago from [place], was there for 6 weeks, has run a fever for 3 days this week-- states had a fever for 2 weeks when he first was in [place], then it went away. C/o muscle aches, chills, joint pain .

## 2015-05-10 NOTE — Progress Notes (Signed)
Patient transported to [facility] Room 10 via stretcher from the ED.  Pt A&O.  VSS.  Oriented pt to call bell, room, and dept [facility].  Placed pt on IV pump, his NS bolus is still infusing.  Hand off report at bedside to night RN.

## 2015-05-10 NOTE — ED Provider Notes (Addendum)
CSN: [id number]     Arrival date & time 05/10/15  1446 History   First MD Initiated Contact with Patient 05/10/15 1531     Chief Complaint  Patient presents with  . Fever  . Code Sepsis     (Consider location/radiation/quality/duration/timing/severity/associated sxs/prior Treatment) Patient is a 39 y.o. male presenting with fever. The history is provided by the patient.  Fever Associated symptoms: no chest pain, no diarrhea, no headaches, no nausea, no rash and no vomiting    patient presents with fever. Recently returned from [place] in [place]. He returned 2 weeks ago and developed fevers 2-3 days ago. States that he was there for 6 weeks in around 2 weeks and he developed fevers. Last around 3 days. States he took some medicine that he believes was for malaria and that resolved. States he was doing well up until a couple days ago. No chest pain. No trouble breathing. No abdominal pain. No vomiting. No color change of his skin or eyes. States he was seen back by his primary care doctor week ago and had good blood work at that time. Now complaining of fevers. He states he had all the immunizations before he went.   Past Medical History  Diagnosis Date  . Hypertension   . Arthritis     arthritis- back & L hip  . Depression     situational depression, pt. out of work   . Osteoarthritis of left hip 06/07/2012  . Diabetes mellitus    Past Surgical History  Procedure Laterality Date  . Joint replacement      R hip  . Total hip arthroplasty  06/07/2012    Procedure: TOTAL HIP ARTHROPLASTY;  Surgeon: Johnny Bridge, MD;  Location: [facility];  Service: Orthopedics;  Laterality: Left;   Family History  Problem Relation Age of Onset  . CAD Neg Hx   . Cancer - Other Father    Social History  Substance Use Topics  . Smoking status: Never Smoker   . Smokeless tobacco: Never Used  . Alcohol Use: 0.5 oz/week    1 drink(s) per week    Review of Systems  Constitutional: Positive for fever.  Negative for activity change and appetite change.  Eyes: Negative for pain.  Respiratory: Negative for chest tightness and shortness of breath.   Cardiovascular: Negative for chest pain and leg swelling.  Gastrointestinal: Negative for nausea, vomiting, abdominal pain and diarrhea.  Genitourinary: Negative for flank pain.  Musculoskeletal: Negative for back pain and neck stiffness.  Skin: Negative for rash.  Neurological: Negative for weakness, numbness and headaches.  Psychiatric/Behavioral: Negative for behavioral problems.      Allergies  Review of patient's allergies indicates no known allergies.  Home Medications   Prior to Admission medications   Medication Sig Start Date End Date Taking? Authorizing Provider  amLODipine (NORVASC) 10 MG tablet Take 10 mg by mouth daily. 11/25/14   Historical Provider, MD  diazepam (VALIUM) 5 MG tablet Take 1 tablet (5 mg total) by mouth every 8 (eight) hours as needed for muscle spasms (and pain). Patient not taking: Reported on 01/11/2015 12/29/14   Clayton Bibles, PA-C  folic acid (FOLVITE) 1 MG tablet Take 1 mg by mouth daily. 12/23/14   Historical Provider, MD  glucose blood test strip Use as instructed 08/15/13   Sheila Oats, MD  glucose monitoring kit (FREESTYLE) monitoring kit Glucometer- ACCUCHECK BRAND 08/15/13   Adeline Saralyn Pilar, MD  Insulin Pen Needle (INSUPEN PEN NEEDLES) 32G X  4 MM MISC Lantus solostar pen  SQ needles 08/15/13   Sheila Oats, MD  Lancets (ACCU-CHEK MULTICLIX) lancets Use as instructed 08/15/13   Sheila Oats, MD  metFORMIN (GLUCOPHAGE) 500 MG tablet Take 500 mg by mouth 2 (two) times daily with a meal.    Historical Provider, MD  orphenadrine (NORFLEX) 100 MG tablet Take 1 tablet (100 mg total) by mouth 2 (two) times daily. 01/11/15   Charlesetta Shanks, MD  oxyCODONE-acetaminophen (PERCOCET/ROXICET) 5-325 MG per tablet Take 1-2 tablets by mouth every 4 (four) hours as needed for severe pain. Patient not taking:  Reported on 01/11/2015 01/11/14   Blanchie Dessert, MD  predniSONE (DELTASONE) 20 MG tablet 3 tabs po daily x 3 days, then 2 tabs x 3 days, then 1.5 tabs x 3 days, then 1 tab x 3 days, then 0.5 tabs x 3 days 01/11/15   Charlesetta Shanks, MD  triamterene-hydrochlorothiazide (MAXZIDE-25) 37.5-25 MG per tablet Take 1 tablet by mouth daily. 12/30/14   Historical Provider, MD  Vitamin D, Ergocalciferol, (DRISDOL) 50000 UNITS CAPS capsule Take 50,000 Units by mouth every 7 (seven) days.  12/18/14   Historical Provider, MD   BP 143/67 mmHg  Pulse 104  Temp(Src) 102.3 F (39.1 C) (Oral)  Resp 26  Ht 5' 10" (1.778 m)  Wt 240 lb (108.863 kg)  BMI 34.44 kg/m2  SpO2 99% Physical Exam  Constitutional: He is oriented to person, place, and time. He appears well-developed and well-nourished.  HENT:  Head: Normocephalic and atraumatic.  Eyes: No scleral icterus.  Cardiovascular: Regular rhythm.   Mild tachycardia  Pulmonary/Chest: Effort normal and breath sounds normal.  Abdominal: Soft. There is no tenderness.  Musculoskeletal: Normal range of motion.  Neurological: He is alert and oriented to person, place, and time.  Skin: Skin is warm.  Psychiatric: He has a normal mood and affect.    ED Course  Procedures (including critical care time) Labs Review Labs Reviewed  COMPREHENSIVE METABOLIC PANEL - Abnormal; Notable for the following:    Potassium 3.2 (*)    Chloride 100 (*)    Glucose, Bld 113 (*)    Creatinine, Ser 1.30 (*)    AST 72 (*)    ALT 64 (*)    Total Bilirubin 2.0 (*)    All other components within normal limits  CBC WITH DIFFERENTIAL/PLATELET - Abnormal; Notable for the following:    WBC 12.7 (*)    Hemoglobin 12.3 (*)    HCT 34.5 (*)    RDW 17.1 (*)    All other components within normal limits  I-STAT CG4 LACTIC ACID, ED - Abnormal; Notable for the following:    Lactic Acid, Venous 2.41 (*)    All other components within normal limits  CULTURE, BLOOD (ROUTINE X 2)  CULTURE,  BLOOD (ROUTINE X 2)  URINE CULTURE  MALARIA SMEAR  URINALYSIS, ROUTINE W REFLEX MICROSCOPIC (NOT AT [facility])  HEPATITIS PANEL, ACUTE  HIV ANTIBODY (ROUTINE TESTING)    Imaging Review Dg Chest 2 View  05/10/2015   CLINICAL DATA:  Fever for 2 days.  EXAM: CHEST  2 VIEW  COMPARISON:  PA and lateral chest 10/29/2013 and 08/12/2013.  FINDINGS: Lung volumes are low but the lungs are clear. Heart size is normal. There is no pneumothorax or pleural effusion. No focal bony abnormality is identified.  IMPRESSION: No acute finding in a low volume chest.   Electronically Signed   By: Inge Rise M.D.   On: 05/10/2015 15:54  I, Davonna Belling R., personally reviewed and evaluated these images and lab results as part of my medical decision-making.   EKG Interpretation None      MDM   Final diagnoses:  None  Patient with fever. Recent travel to [place]. Discussed with Dr. Drucilla Schmidt from infectious disease. Consideration for malaria or hepatitis. Patient's lactic acid is minimally elevated. Patient's creatinine is also mildly elevated. Patient is overall well-appearing. His LFTs are slightly above normal. Will admit to internal medicine for further evaluation. Patient's outpatient malaria regimen will likely involve chloroquine Primaquine and and Malarone    Davonna Belling, MD 05/10/15 1637  Patient smear has shown likely malaria. Also patient states he does have sickle cell he thinks it is [place].  Davonna Belling, MD 05/10/15 [phone]

## 2015-05-10 NOTE — Progress Notes (Signed)
ANTIBIOTIC CONSULT NOTE - INITIAL  Pharmacy Consult for CHLOROQUINE/PRIMAQUINE/MALARONE  Indication: Malaria  No Known Allergies  Patient Measurements: Height: 5' 10" (177.8 cm) Weight: 240 lb (108.863 kg) IBW/kg (Calculated) : 73 Adjusted Body Weight:    Vital Signs: Temp: 102.3 F (39.1 C) (08/11 1858) Temp Source: Oral (08/11 1858) BP: 131/57 mmHg (08/11 1858) Pulse Rate: 104 (08/11 1858) Intake/Output from previous day:   Intake/Output from this shift:    Labs:  Recent Labs  05/10/15 1529  WBC 12.7*  HGB 12.3*  PLT 278  CREATININE 1.30*   Estimated Creatinine Clearance: 92.4 mL/min (by C-G formula based on Cr of 1.3). No results for input(s): VANCOTROUGH, VANCOPEAK, VANCORANDOM, GENTTROUGH, GENTPEAK, GENTRANDOM, TOBRATROUGH, TOBRAPEAK, TOBRARND, AMIKACINPEAK, AMIKACINTROU, AMIKACIN in the last 72 hours.   Microbiology: No results found for this or any previous visit (from the past 720 hour(s)).  Medical History: Past Medical History  Diagnosis Date  . Hypertension   . Arthritis     arthritis- back & L hip  . Depression     situational depression, pt. out of work   . Osteoarthritis of left hip 06/07/2012  . Diabetes mellitus   . HTN (hypertension) 05/10/2015    Medications:  Prescriptions prior to admission  Medication Sig Dispense Refill Last Dose  . amLODipine (NORVASC) 10 MG tablet Take 10 mg by mouth daily.  3 05/10/2015 at Unknown time  . carvedilol (COREG) 12.5 MG tablet Take 12.5 mg by mouth 2 (two) times daily.  1 05/10/2015 at Unknown time  . folic acid (FOLVITE) 1 MG tablet Take 1 mg by mouth daily.  11 05/10/2015 at Unknown time  . glucose blood test strip Use as instructed (Patient taking differently: 1 each by Other route every other day. Use as instructed) 100 each 12 05/09/2015 at Unknown time  . glucose monitoring kit (FREESTYLE) monitoring kit Glucometer- ACCUCHECK BRAND 1 each 0 Taking  . JANUVIA 100 MG tablet Take 100 mg by mouth daily.  0  05/10/2015 at Unknown time  . Lancets (ACCU-CHEK MULTICLIX) lancets Use as instructed (Patient taking differently: 1 each by Other route every other day. ) 100 each 12 05/09/2015 at Unknown time  . lisinopril (PRINIVIL,ZESTRIL) 5 MG tablet Take 5 mg by mouth daily.  6 05/10/2015 at Unknown time  . metFORMIN (GLUCOPHAGE) 500 MG tablet Take 500 mg by mouth 2 (two) times daily with a meal.   05/10/2015 at Unknown time  . orphenadrine (NORFLEX) 100 MG tablet Take 1 tablet (100 mg total) by mouth 2 (two) times daily. 30 tablet 0 05/10/2015 at Unknown time  . triamterene-hydrochlorothiazide (MAXZIDE-25) 37.5-25 MG per tablet Take 1 tablet by mouth daily.  1 05/10/2015 at Unknown time   Assessment: 39 y/o M presented with fever x 3 days, muscle aches, chills, and joint pain. Patient has recently spent 6 weeks in [place] and returned 2 weeks ago. He states he had all the immunizations before he went.Patient smear has shown likely malaria.ID consulted  WBC 12.7, K=3.2 low, Scr 1.3 elevated, AST 72, ALT 64, Tbili 2, Lactic acid 2.4,    *Because primaquine can cause hemolysis that can be fatal in persons with glucose-6-phosphate dehydrogenase (G6PD) deficiency, the CDC recommends that a screening test must be performed prior to use to rule out such a deficiency   Goal of Therapy:  Eradication of infection   Plan:  G6PD def test. Chloroquine 10849m now, then 5055mat 6 hrs, 24hrs, and 48 hrs. Primaquine 52.49m35m69m52mse) po daily  x 14 days Malarone 1076m/400mg (4 tabs) once daily x 3 days.   Genoa Freyre S. RAlford Highland PharmD, BCPS Clinical Staff Pharmacist Pager [phone] REilene GhaziStillinger 05/10/2015,7:52 PM

## 2015-05-11 DIAGNOSIS — R740 Nonspecific elevation of levels of transaminase and lactic acid dehydrogenase [LDH]: Secondary | ICD-10-CM | POA: Diagnosis present

## 2015-05-11 DIAGNOSIS — D649 Anemia, unspecified: Secondary | ICD-10-CM | POA: Diagnosis not present

## 2015-05-11 DIAGNOSIS — E876 Hypokalemia: Secondary | ICD-10-CM | POA: Diagnosis present

## 2015-05-11 DIAGNOSIS — E860 Dehydration: Secondary | ICD-10-CM | POA: Diagnosis present

## 2015-05-11 DIAGNOSIS — E119 Type 2 diabetes mellitus without complications: Secondary | ICD-10-CM | POA: Diagnosis present

## 2015-05-11 DIAGNOSIS — Z96641 Presence of right artificial hip joint: Secondary | ICD-10-CM | POA: Diagnosis present

## 2015-05-11 DIAGNOSIS — B509 Plasmodium falciparum malaria, unspecified: Secondary | ICD-10-CM | POA: Insufficient documentation

## 2015-05-11 DIAGNOSIS — B54 Unspecified malaria: Secondary | ICD-10-CM | POA: Diagnosis present

## 2015-05-11 DIAGNOSIS — D638 Anemia in other chronic diseases classified elsewhere: Secondary | ICD-10-CM | POA: Diagnosis present

## 2015-05-11 DIAGNOSIS — R945 Abnormal results of liver function studies: Secondary | ICD-10-CM | POA: Diagnosis present

## 2015-05-11 DIAGNOSIS — I10 Essential (primary) hypertension: Secondary | ICD-10-CM | POA: Diagnosis present

## 2015-05-11 LAB — URINALYSIS, ROUTINE W REFLEX MICROSCOPIC
Bilirubin Urine: NEGATIVE
Glucose, UA: NEGATIVE mg/dL
Hgb urine dipstick: NEGATIVE
Ketones, ur: NEGATIVE mg/dL
Leukocytes, UA: NEGATIVE
Nitrite: NEGATIVE
Protein, ur: NEGATIVE mg/dL
Specific Gravity, Urine: 1.014 (ref 1.005–1.030)
Urobilinogen, UA: 1 mg/dL (ref 0.0–1.0)
pH: 6 (ref 5.0–8.0)

## 2015-05-11 LAB — COMPREHENSIVE METABOLIC PANEL
ALT: 58 U/L (ref 17–63)
AST: 60 U/L — ABNORMAL HIGH (ref 15–41)
Albumin: 3.4 g/dL — ABNORMAL LOW (ref 3.5–5.0)
Alkaline Phosphatase: 52 U/L (ref 38–126)
Anion gap: 7 (ref 5–15)
BUN: 12 mg/dL (ref 6–20)
CO2: 28 mmol/L (ref 22–32)
Calcium: 8.2 mg/dL — ABNORMAL LOW (ref 8.9–10.3)
Chloride: 104 mmol/L (ref 101–111)
Creatinine, Ser: 1.12 mg/dL (ref 0.61–1.24)
GFR calc Af Amer: >60 mL/min (ref >60)
GFR calc non Af Amer: >60 mL/min (ref >60)
Glucose, Bld: 126 mg/dL — ABNORMAL HIGH (ref 65–99)
Potassium: 3.4 mmol/L — ABNORMAL LOW (ref 3.5–5.1)
Sodium: 139 mmol/L (ref 135–145)
Total Bilirubin: 1.6 mg/dL — ABNORMAL HIGH (ref 0.3–1.2)
Total Protein: 6.4 g/dL — ABNORMAL LOW (ref 6.5–8.1)

## 2015-05-11 LAB — GLUCOSE, CAPILLARY
Glucose-Capillary: 135 mg/dL — ABNORMAL HIGH (ref 65–99)
Glucose-Capillary: 92 mg/dL (ref 65–99)
Glucose-Capillary: 123 mg/dL — ABNORMAL HIGH (ref 65–99)
Glucose-Capillary: 126 mg/dL — ABNORMAL HIGH (ref 65–99)

## 2015-05-11 LAB — IRON AND TIBC
Iron: 44 ug/dL — ABNORMAL LOW (ref 45–182)
Saturation Ratios: 14 % — ABNORMAL LOW (ref 17.9–39.5)
TIBC: 316 ug/dL (ref 250–450)
UIBC: 272 ug/dL

## 2015-05-11 LAB — FERRITIN: Ferritin: 2446 ng/mL — ABNORMAL HIGH (ref 24–336)

## 2015-05-11 LAB — HEPATITIS PANEL, ACUTE
HCV Ab: <0.1 s/co ratio (ref 0.0–0.9)
Hep A IgM: NEGATIVE
Hep B C IgM: NEGATIVE
Hepatitis B Surface Ag: NEGATIVE

## 2015-05-11 LAB — CBC
HCT: 29 % — ABNORMAL LOW (ref 39.0–52.0)
Hemoglobin: 10.5 g/dL — ABNORMAL LOW (ref 13.0–17.0)
MCH: 28 pg (ref 26.0–34.0)
MCHC: 36.2 g/dL — ABNORMAL HIGH (ref 30.0–36.0)
MCV: 77.3 fL — ABNORMAL LOW (ref 78.0–100.0)
Platelets: 261 10*3/uL (ref 150–400)
RBC: 3.75 MIL/uL — ABNORMAL LOW (ref 4.22–5.81)
RDW: 16.9 % — ABNORMAL HIGH (ref 11.5–15.5)
WBC: 8.4 10*3/uL (ref 4.0–10.5)

## 2015-05-11 LAB — VITAMIN B12: Vitamin B-12: 172 pg/mL — ABNORMAL LOW (ref 180–914)

## 2015-05-11 LAB — LACTIC ACID, PLASMA: Lactic Acid, Venous: 1.2 mmol/L (ref 0.5–2.0)

## 2015-05-11 LAB — MAGNESIUM: Magnesium: 2.1 mg/dL (ref 1.7–2.4)

## 2015-05-11 LAB — HIV ANTIBODY (ROUTINE TESTING W REFLEX): HIV Screen 4th Generation wRfx: NONREACTIVE

## 2015-05-11 LAB — FOLATE: Folate: 52.2 ng/mL (ref >5.9)

## 2015-05-11 LAB — MRSA PCR SCREENING: MRSA by PCR: NEGATIVE

## 2015-05-11 LAB — RETICULOCYTES
RBC.: 3.74 MIL/uL — ABNORMAL LOW (ref 4.22–5.81)
Retic Count, Absolute: 153.3 10*3/uL (ref 19.0–186.0)
Retic Ct Pct: 4.1 % — ABNORMAL HIGH (ref 0.4–3.1)

## 2015-05-11 MED ORDER — POTASSIUM CHLORIDE CRYS ER 20 MEQ PO TBCR
40.0000 meq | EXTENDED_RELEASE_TABLET | Freq: Once | ORAL | Status: AC
  Administered 2015-05-11: 40 meq via ORAL
  Filled 2015-05-11: qty 2

## 2015-05-11 NOTE — Consult Note (Signed)
[place] for Infectious Disease    Date of Admission:  05/10/2015   Total days of antibiotics 2        Day 2 chloroquine, primaquine, malorone             Reason for Consult: Probable Malaria    Referring Physician: Dr. Grandville Silos   Principal Problem:   Malaria Active Problems:   Diabetes type 2, controlled   Hypokalemia   Dehydration   Leukocytosis   HTN (hypertension)   Elevated LFTs   Anemia   . atovaquone-proguanil  4 tablet Oral QHS  . carvedilol  12.5 mg Oral BID  . chloroquine  500 mg Oral Q24H  . docusate sodium  100 mg Oral BID  . enoxaparin (LOVENOX) injection  40 mg Subcutaneous Q24H  . folic acid  1 mg Oral Daily  . insulin aspart  0-15 Units Subcutaneous TID WC  . primaquine  30 mg Oral QHS    Recommendations: 1. Highly probable malaria - growing 0.2% Malaria Falciparum 1. Wait for thick smear results and identification of plasmodium species 2. Follow up results and tailor specific therapy - Continue malarone 3. D/C Chloroquine and primaquine 4. Patient will need prophylaxis against malaria for his next trip to [place], he plans to go in the future (his family still resides there) and he would like to know more, would probably need better mosquito prevention and chemophylaxis based on chloroquine resistant endemic area guidelines  1. Was Told about ID's travel clinic and given more information on how to contact them  Assessment: Patient presents w/ clinical syndrome highly suspicious for malaria given pattern of fevers, travel to endemic areas after long absence, and general aches and pain, abdominal tenderness, and some transaminitis and anemia. Patient has had think and thick bloodsmears and per report patient's thick smear tested positive for parasites. Patient was started on chloroquine, primaquine, and malornone. Patient visited typically chloroquine resistenent area [place]) and likely will need ACT (artemenisin combination therapy) depending  on which plasmodium species is identified. He is Hep Panel and HIV negative.     HPI: Nicolas [name] is a 39 y.o. male w/ pmh sig for daibetes, htn, deperession who presents w/ fevers and fatigue. Patient describes fevers started 3 days /w general malaise, joint aches, muscle aches that did not get better w/ aleve. Patient describes muscle and hjoints were throbbing in nature, relapsed and remitted ,and did not stay localized. Pain was not severe. Denies nausea, vomiting, GI distress, stool or urinary changes. No blood stool. Denies chest pain or shortness of breath. Denies confusion, headache, or back pain or loss of consciousness. Patient describes recent travel to [place], [place]. Returned 2 weeks ago. Fevers started 3 days ago. Patient was in [place] for 6 weeks visiting his grandmother. PAtient does not describe any outbreaks during his visit. No sick contacts he met while traveling or at home, lives with wife and 2 children. Denies TB exposure. States he is up to date on his vaccinations. Describes have a 2-3 day fever w/ malaise, fatigue, and dehydration a few weeks into his trip into [place], was treated witrh something although he does not know and fever and illness went away. Was similar in presentation to current visit. 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 PAtient denies other travel.    Review of Systems: Pertinent items are noted in HPI.  Past Medical History  Diagnosis Date  . Hypertension   . Arthritis     arthritis- back & L hip  . Depression     situational depression, pt. out of work   . Osteoarthritis of left hip 06/07/2012  . Diabetes mellitus   . HTN (hypertension) 05/10/2015    Social History  Substance Use Topics  . Smoking status: Never Smoker   . Smokeless tobacco: Never Used  . Alcohol Use: 0.5 oz/week    1 Standard drinks or equivalent per week    Family History  Problem Relation Age of Onset  . CAD Neg Hx    . Cancer - Other Father    No Known Allergies  OBJECTIVE: Blood pressure 100/52, pulse 92, temperature 100 F (37.8 C), temperature source Oral, resp. rate 18, height '5\' 10"'  (1.778 m), weight 110 kg (242 lb 8.1 oz), SpO2 96 %. General: NAD, diaphoretic sitting in bed, A&Ox3 Skin: No evidence of rash, redness, erythema Lungs: CTAB Cor: RRR no M or G Abdomen: mildly tender, soft, nondistende,d +BS Unable to demostrate hepatosplenomegaly given body habitus  Lab Results Lab Results  Component Value Date   WBC 8.4 05/11/2015   HGB 10.5* 05/11/2015   HCT 29.0* 05/11/2015   MCV 77.3* 05/11/2015   PLT 261 05/11/2015    Lab Results  Component Value Date   CREATININE 1.12 05/11/2015   BUN 12 05/11/2015   NA 139 05/11/2015   K 3.4* 05/11/2015   CL 104 05/11/2015   CO2 28 05/11/2015    Lab Results  Component Value Date   ALT 58 05/11/2015   AST 60* 05/11/2015   ALKPHOS 52 05/11/2015   BILITOT 1.6* 05/11/2015     Microbiology: Recent Results (from the past 240 hour(s))  Malaria smear     Status: None (Preliminary result)   Collection Time: 05/10/15  3:29 PM  Result Value Ref Range Status   Specimen Description BLOOD  Final   Special Requests NONE  Final   Malaria Prep   Final    Negative pending thick smear review Performed at [facility]    Report Status PENDING  Incomplete  MRSA PCR Screening     Status: None   Collection Time: 05/11/15  5:30 AM  Result Value Ref Range Status   MRSA by PCR NEGATIVE NEGATIVE Final    Comment:        The GeneXpert MRSA Assay (FDA approved for NASAL specimens only), is one component of a comprehensive MRSA colonization surveillance program. It is not intended to diagnose MRSA infection nor to guide or monitor treatment for MRSA infections.     Arma Heading, [facility] for Infectious Disease [facility] 05/11/2015, 11:40 AM

## 2015-05-11 NOTE — Evaluation (Addendum)
Physical Therapy Evaluation Patient Details Name: Nicolas [name] MRN: [id number] DOB: [birth year] Today's Date: 05/11/2015   History of Present Illness  Pt with malaria following trip to [place] to see family.    Clinical Impression  Pt presents with above.  Currently is mod I for all activity with use of SPC which is his baseline.  PT to sign off at this time and pt will not need any follow up therapy.     Follow Up Recommendations No PT follow up    Equipment Recommendations  None recommended by PT    Recommendations for Other Services       Precautions / Restrictions Precautions Precautions: None Restrictions Weight Bearing Restrictions: No      Mobility  Bed Mobility Overal bed mobility: Independent                Transfers Overall transfer level: Modified independent                  Ambulation/Gait Ambulation/Gait assistance: Modified independent (Device/Increase time) Ambulation Distance (Feet): 300 Feet Assistive device: Straight cane Gait Pattern/deviations: WFL(Within Functional Limits)        Stairs Stairs: Yes Stairs assistance: Modified independent (Device/Increase time) Stair Management: No rails;Step to pattern;Forwards;With cane Number of Stairs: 3    Wheelchair Mobility    Modified Rankin (Stroke Patients Only)       Balance Overall balance assessment: Modified Independent                                           Pertinent Vitals/Pain Pain Assessment: No/denies pain    Home Living Family/patient expects to be discharged to:: Private residence Living Arrangements: Spouse/significant other Available Help at Discharge: Family;Available PRN/intermittently Type of Home: House Home Access: Stairs to enter Entrance Stairs-Rails: None Entrance Stairs-Number of Steps: 2 Home Layout: One level Home Equipment: Cane - single point      Prior Function Level of Independence: Independent with assistive  device(s)               Hand Dominance   Dominant Hand: Right    Extremity/Trunk Assessment   Upper Extremity Assessment: Overall WFL for tasks assessed           Lower Extremity Assessment: Overall WFL for tasks assessed      Cervical / Trunk Assessment: Normal  Communication   Communication: No difficulties  Cognition Arousal/Alertness: Awake/alert Behavior During Therapy: WFL for tasks assessed/performed Overall Cognitive Status: Within Functional Limits for tasks assessed                      General Comments      Exercises        Assessment/Plan    PT Assessment Patent does not need any further PT services  PT Diagnosis     PT Problem List    PT Treatment Interventions     PT Goals (Current goals can be found in the Care Plan section) Acute Rehab PT Goals Patient Stated Goal: to return home PT Goal Formulation: All assessment and education complete, DC therapy    Frequency     Barriers to discharge        Co-evaluation               End of Session   Activity Tolerance: Patient tolerated treatment well Patient left: in chair;with call  bell/phone within reach Nurse Communication: Mobility status    Functional Assessment Tool Used: Clinical judgement Functional Limitation: Mobility: Walking and moving around Mobility: Walking and Moving Around Current Status [phone]): 0 percent impaired, limited or restricted Mobility: Walking and Moving Around Goal Status [phone]): 0 percent impaired, limited or restricted Mobility: Walking and Moving Around Discharge Status [phone]): 0 percent impaired, limited or restricted    Time: 0957-1009 PT Time Calculation (min) (ACUTE ONLY): 12 min   Charges:   PT Evaluation $Initial PT Evaluation Tier I: 1 Procedure     PT G Codes:   PT G-Codes **NOT FOR INPATIENT CLASS** Functional Assessment Tool Used: Clinical judgement Functional Limitation: Mobility: Walking and moving around Mobility:  Walking and Moving Around Current Status ([id number]): 0 percent impaired, limited or restricted Mobility: Walking and Moving Around Goal Status (G9562): 0 percent impaired, limited or restricted Mobility: Walking and Moving Around Discharge Status [phone]): 0 percent impaired, limited or restricted    Vista Deck 05/11/2015, 12:17 PM

## 2015-05-11 NOTE — Discharge Summary (Deleted)
[facility] PROGRESS NOTE  Nicolas [name] ZOX:[id number] DOB: Feb 09, 1976 DOA: 05/10/2015 PCP: [name], MD  Assessment/Plan: #1. Malaria Clinical improvement. Malaria smear positive for plasmodium falciparium.LFTs trending down. Bilirubin trending down. Patient on chloroquine, Primaquine, Malarone. DC chloroquine and Premarin. Continue Malarone. Supportive care. ID following.  #2 anemia Likely dilutional in nature. No overt bleeding. Anemia panel consistent with anemia of chronic disease.  #3 hypokalemia Replete.  #4 dehydration IV fluids.  #5 hypertension Continue home dose correct.  #6 transaminitis Likely secondary to problem #1. Trending down. Acute hepatitis panel negative. HIV nonreactive. Follow.  #7 diabetes mellitus Hemoglobin A1c pending. Oral hypoglycemic agents on hold. CBGs have range from 90-235. Sliding scale insulin.  #8 prophylaxis Lovenox for DVT prophylaxis.  Code Status: Full Family Communication: updated patient Disposition Plan: Home when medically stable hopefully 1-2 days.   Consultants: Infectious disease: Dr. Daiva Eves 05/11/2015  Procedures:  None  Antibiotics:  Malarone 05/10/2015  Chloroquine 05/10/2015  Primaquine 05/10/2015  HPI/Subjective: Patient states he's feeling much better. No chest pain. No shortness of breath. No nausea, no emesis.  Objective: Filed Vitals:   05/11/15 0508  BP: 100/52  Pulse: 92  Temp: 100 F (37.8 C)  Resp: 18    Intake/Output Summary (Last 24 hours) at 05/11/15 1223 Last data filed at 05/11/15 0500  Gross per 24 hour  Intake 2614.17 ml  Output    250 ml  Net 2364.17 ml   Filed Weights   05/10/15 1512 05/11/15 0508  Weight: 108.863 kg (240 lb) 110 kg (242 lb 8.1 oz)    Exam:   General:  NAD  Cardiovascular:RRR  Respiratory: CTAB  Abdomen: Soft, nontender, nondistended, positive bowel sounds.  Musculoskeletal: No clubbing cyanosis or edema.  Data Reviewed: Basic  Metabolic Panel:  Recent Labs Lab 05/10/15 1529 05/10/15 1725 05/11/15 0430  NA 140  --  139  K 3.2*  --  3.4*  CL 100*  --  104  CO2 28  --  28  GLUCOSE 113*  --  126*  BUN 14  --  12  CREATININE 1.30*  --  1.12  CALCIUM 9.3  --  8.2*  MG  --  1.7 2.1   Liver Function Tests:  Recent Labs Lab 05/10/15 1529 05/11/15 0430  AST 72* 60*  ALT 64* 58  ALKPHOS 59 52  BILITOT 2.0* 1.6*  PROT 7.7 6.4*  ALBUMIN 4.2 3.4*   No results for input(s): LIPASE, AMYLASE in the last 168 hours. No results for input(s): AMMONIA in the last 168 hours. CBC:  Recent Labs Lab 05/10/15 1529 05/11/15 0430  WBC 12.7* 8.4  NEUTROABS 10.2*  --   HGB 12.3* 10.5*  HCT 34.5* 29.0*  MCV 79.5 77.3*  PLT 278 261   Cardiac Enzymes: No results for input(s): CKTOTAL, CKMB, CKMBINDEX, TROPONINI in the last 168 hours. BNP (last 3 results) No results for input(s): BNP in the last 8760 hours.  ProBNP (last 3 results) No results for input(s): PROBNP in the last 8760 hours.  CBG:  Recent Labs Lab 05/10/15 2216 05/11/15 0732  GLUCAP 117* 135*    Recent Results (from the past 240 hour(s))  Malaria smear     Status: None (Preliminary result)   Collection Time: 05/10/15  3:29 PM  Result Value Ref Range Status   Specimen Description BLOOD  Final   Special Requests NONE  Final   Malaria Prep   Final    Negative pending thick smear review Performed at [facility]    Report Status PENDING  Incomplete  MRSA PCR Screening     Status: None   Collection Time: 05/11/15  5:30 AM  Result Value Ref Range Status   MRSA by PCR NEGATIVE NEGATIVE Final    Comment:        The GeneXpert MRSA Assay (FDA approved for NASAL specimens only), is one component of a comprehensive MRSA colonization surveillance program. It is not intended to diagnose MRSA infection nor to guide or monitor treatment for MRSA infections.      Studies: Dg Chest 2 View  05/10/2015   CLINICAL DATA:  Fever for 2  days.  EXAM: CHEST  2 VIEW  COMPARISON:  PA and lateral chest 10/29/2013 and 08/12/2013.  FINDINGS: Lung volumes are low but the lungs are clear. Heart size is normal. There is no pneumothorax or pleural effusion. No focal bony abnormality is identified.  IMPRESSION: No acute finding in a low volume chest.   Electronically Signed   By: Drusilla Kanner M.D.   On: 05/10/2015 15:54    Scheduled Meds: . atovaquone-proguanil  4 tablet Oral QHS  . carvedilol  12.5 mg Oral BID  . chloroquine  500 mg Oral Q24H  . docusate sodium  100 mg Oral BID  . enoxaparin (LOVENOX) injection  40 mg Subcutaneous Q24H  . folic acid  1 mg Oral Daily  . insulin aspart  0-15 Units Subcutaneous TID WC  . primaquine  30 mg Oral QHS   Continuous Infusions: . sodium chloride 0.9 % 1,000 mL infusion      Principal Problem:   Malaria Active Problems:   Diabetes type 2, controlled   Hypokalemia   Dehydration   Leukocytosis   HTN (hypertension)   Elevated LFTs   Anemia    Time spent: 35 mins    [facility] MD [facility] Pager [phone]. If 7PM-7AM, please contact night-coverage at [email or web address], password [facility] 05/11/2015, 12:23 PM

## 2015-05-11 NOTE — Progress Notes (Signed)
OT Cancellation Note  Patient Details Name: Nicolas [name] MRN: [id number] DOB: 1976-09-24   Cancelled Treatment:    Reason Eval/Treat Not Completed: OT screened, no needs identified, will sign off  [facility], OTR/L  [phone] 05/11/2015 05/11/2015, 4:35 PM

## 2015-05-11 NOTE — Progress Notes (Signed)
[facility] PROGRESS NOTE  Barkley Boards ZOX:[id number] DOB: 03-Oct-1975 DOA: 05/10/2015 PCP: [name], MD  Assessment/Plan: #1. Malaria Clinical improvement. Malaria smear positive for plasmodium falciparium.LFTs trending down. Bilirubin trending down. Patient on chloroquine, Primaquine, Malarone. DC chloroquine and Premarin. Continue Malarone. Supportive care. ID following.  #2 anemia Likely dilutional in nature. No overt bleeding. Anemia panel consistent with anemia of chronic disease.  #3 hypokalemia Replete.  #4 dehydration IV fluids.  #5 hypertension Continue home dose correct.  #6 transaminitis Likely secondary to problem #1. Trending down. Acute hepatitis panel negative. HIV nonreactive. Follow.  #7 diabetes mellitus Hemoglobin A1c pending. Oral hypoglycemic agents on hold. CBGs have range from 90-235. Sliding scale insulin.  #8 prophylaxis Lovenox for DVT prophylaxis.  Code Status: Full Family Communication: updated patient Disposition Plan: Home when medically stable hopefully 1-2 days.   Consultants: Infectious disease: Dr. Daiva Eves 05/11/2015  Procedures:  None  Antibiotics:  Malarone 05/10/2015  Chloroquine 05/10/2015  Primaquine 05/10/2015  HPI/Subjective: Patient states he's feeling much better. No chest pain. No shortness of breath. No nausea, no emesis.  Objective: Filed Vitals:   05/11/15 0508  BP: 100/52  Pulse: 92  Temp: 100 F (37.8 C)  Resp: 18    Intake/Output Summary (Last 24 hours) at 05/11/15 1223 Last data filed at 05/11/15 0500  Gross per 24 hour  Intake 2614.17 ml  Output  250 ml  Net 2364.17 ml   Filed Weights   05/10/15 1512 05/11/15 0508  Weight: 108.863 kg (240 lb) 110 kg (242 lb 8.1 oz)    Exam:   General: NAD  Cardiovascular:RRR  Respiratory: CTAB  Abdomen: Soft, nontender, nondistended, positive bowel sounds.  Musculoskeletal: No clubbing cyanosis or  edema.  Data Reviewed: Basic Metabolic Panel:  Last Labs      Recent Labs Lab 05/10/15 1529 05/10/15 1725 05/11/15 0430  NA 140 --  139  K 3.2* --  3.4*  CL 100* --  104  CO2 28 --  28  GLUCOSE 113* --  126*  BUN 14 --  12  CREATININE 1.30* --  1.12  CALCIUM 9.3 --  8.2*  MG --  1.7 2.1     Liver Function Tests:  Last Labs      Recent Labs Lab 05/10/15 1529 05/11/15 0430  AST 72* 60*  ALT 64* 58  ALKPHOS 59 52  BILITOT 2.0* 1.6*  PROT 7.7 6.4*  ALBUMIN 4.2 3.4*      Last Labs     No results for input(s): LIPASE, AMYLASE in the last 168 hours.    Last Labs     No results for input(s): AMMONIA in the last 168 hours.   CBC:  Last Labs      Recent Labs Lab 05/10/15 1529 05/11/15 0430  WBC 12.7* 8.4  NEUTROABS 10.2* --   HGB 12.3* 10.5*  HCT 34.5* 29.0*  MCV 79.5 77.3*  PLT 278 261     Cardiac Enzymes:  Last Labs     No results for input(s): CKTOTAL, CKMB, CKMBINDEX, TROPONINI in the last 168 hours.   BNP (last 3 results)  Recent Labs (within last 365 days)    No results for input(s): BNP in the last 8760 hours.    ProBNP (last 3 results)  Recent Labs (within last 365 days)    No results for input(s): PROBNP in the last 8760 hours.    CBG:  Last Labs      Recent Labs Lab 05/10/15 2216 05/11/15 0732  GLUCAP 117* 135*      Recent Results (from the past 240 hour(s))  Malaria smear Status: None (Preliminary result)   Collection Time: 05/10/15 3:29 PM  Result Value Ref Range Status   Specimen Description BLOOD  Final   Special Requests NONE  Final   Malaria Prep   Final    Negative pending thick smear review Performed at [facility]    Report Status PENDING  Incomplete  MRSA PCR Screening Status: None   Collection Time: 05/11/15 5:30 AM  Result Value Ref Range Status   MRSA  by PCR NEGATIVE NEGATIVE Final    Comment:   The GeneXpert MRSA Assay (FDA approved for NASAL specimens only), is one component of a comprehensive MRSA colonization surveillance program. It is not intended to diagnose MRSA infection nor to guide or monitor treatment for MRSA infections.      Studies:  Imaging Results (Last 48 hours)    Dg Chest 2 View  05/10/2015 CLINICAL DATA: Fever for 2 days. EXAM: CHEST 2 VIEW COMPARISON: PA and lateral chest 10/29/2013 and 08/12/2013. FINDINGS: Lung volumes are low but the lungs are clear. Heart size is normal. There is no pneumothorax or pleural effusion. No focal bony abnormality is identified. IMPRESSION: No acute finding in a low volume chest. Electronically Signed By: Drusilla Kanner M.D. On: 05/10/2015 15:54     Scheduled Meds: . atovaquone-proguanil 4 tablet Oral QHS  . carvedilol 12.5 mg Oral BID  . chloroquine 500 mg Oral Q24H  . docusate sodium 100 mg Oral BID  . enoxaparin (LOVENOX) injection 40 mg Subcutaneous Q24H  . folic acid 1 mg Oral Daily  . insulin aspart 0-15 Units Subcutaneous TID WC  . primaquine 30 mg Oral QHS   Continuous Infusions: . sodium chloride 0.9 % 1,000 mL infusion     Principal Problem:  Malaria Active Problems:  Diabetes type 2, controlled  Hypokalemia  Dehydration  Leukocytosis  HTN (hypertension)  Elevated LFTs  Anemia    Time spent: 35 mins    [facility] Pager [phone]. If 7PM-7AM, please contact night-coverage at [email or web address], password [facility] 05/11/2015, 12:23 PM

## 2015-05-12 DIAGNOSIS — B509 Plasmodium falciparum malaria, unspecified: Principal | ICD-10-CM

## 2015-05-12 LAB — GLUCOSE, CAPILLARY
Glucose-Capillary: 110 mg/dL — ABNORMAL HIGH (ref 65–99)
Glucose-Capillary: 100 mg/dL — ABNORMAL HIGH (ref 65–99)

## 2015-05-12 LAB — BASIC METABOLIC PANEL
Anion gap: 6 (ref 5–15)
BUN: 8 mg/dL (ref 6–20)
CO2: 27 mmol/L (ref 22–32)
Calcium: 8.1 mg/dL — ABNORMAL LOW (ref 8.9–10.3)
Chloride: 105 mmol/L (ref 101–111)
Creatinine, Ser: 1.03 mg/dL (ref 0.61–1.24)
GFR calc Af Amer: >60 mL/min (ref >60)
GFR calc non Af Amer: >60 mL/min (ref >60)
Glucose, Bld: 102 mg/dL — ABNORMAL HIGH (ref 65–99)
Potassium: 3.2 mmol/L — ABNORMAL LOW (ref 3.5–5.1)
Sodium: 138 mmol/L (ref 135–145)

## 2015-05-12 LAB — CBC
HCT: 28.1 % — ABNORMAL LOW (ref 39.0–52.0)
Hemoglobin: 10.1 g/dL — ABNORMAL LOW (ref 13.0–17.0)
MCH: 27.7 pg (ref 26.0–34.0)
MCHC: 35.9 g/dL (ref 30.0–36.0)
MCV: 77.2 fL — ABNORMAL LOW (ref 78.0–100.0)
Platelets: 243 10*3/uL (ref 150–400)
RBC: 3.64 MIL/uL — ABNORMAL LOW (ref 4.22–5.81)
RDW: 16.6 % — ABNORMAL HIGH (ref 11.5–15.5)
WBC: 9.1 10*3/uL (ref 4.0–10.5)

## 2015-05-12 LAB — URINE CULTURE: Culture: NO GROWTH

## 2015-05-12 MED ORDER — POTASSIUM CHLORIDE CRYS ER 20 MEQ PO TBCR
40.0000 meq | EXTENDED_RELEASE_TABLET | ORAL | Status: AC
  Administered 2015-05-12 (×2): 40 meq via ORAL
  Filled 2015-05-12 (×2): qty 2

## 2015-05-12 MED ORDER — ATOVAQUONE-PROGUANIL HCL 250-100 MG PO TABS: 4.0000 | ORAL_TABLET | Freq: Every day | ORAL | Status: DC

## 2015-05-12 NOTE — Discharge Summary (Signed)
Physician Discharge Summary  Nicolas [name] VXB:[id number] DOB: 09-Jul-1976 DOA: 05/10/2015  PCP: [name], MD  Admit date: 05/10/2015 Discharge date: 05/12/2015  Time spent: 65 minutes  Recommendations for Outpatient Follow-up:  1. Follow-up with [name], MD in 1-2 weeks. On follow-up patient needs a comprehensive metabolic profile done to follow-up on electrolytes and renal function and liver enzymes. Patient need a CBC done to follow-up on his H&H. Patient blood pressure on it to be reassessed.  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 male  With history of diabetes mellitus, hypertension, depression who presents to the ED with 2-3 days of fever. Patient stated that he took a trip to [place] in [place] and recently returned from there 2 weeks prior to admission. Patient was in [place] for about 6 weeks. 2 weeks into his travels he developed fevers that lasted around 3 days. Patient stated he got some medication from the pharmacy over-the-counter which he can't quite remember what it was however felt better. Patient stated that he returned from his trip 2 weeks ago was doing fine however 2-3 days prior to admission he noted that he had a fever at night and used some Aleve and was doing okay. One day prior to admission was doing fine in the morning, however in the afternoon he started to develop a fever and on the morning of admission developed fevers. Patient also developed some chills,  generalized weakness, dehydration, nausea. Patient denied any emesis no headaches no visual changes no cough no sore throat no chest pain no shortness of breath no diarrhea no constipation no hematemesis no hematochezia no melanoma. Patient was seen in the emergency room comprehensive metabolic profile obtained at a potassium of 3.2 chloride of 100 creatinine of 1.3 glucose of 113 AST of 72 ALT of 64 bilirubin of 2.0 otherwise is within normal limits. Lactic acid was 2.41. CBC had a white count of 12.7 hemoglobin of 12.3 otherwise was within normal limits. Smear was sent. Chest x-ray obtained was negative. Per ED physician  smear was concerning for malaria. [facility] were called to admit the patient for further evaluation and management.   Hospital Course:  #1. Malaria Patient presented with fevers and chills with a transaminitis and had recently returned from [place], [place]. Patient was admitted with concerns for malaria and as such was placed empirically on chloroquine, primaquine, Malarone. Patient was also placed on IV fluids and supportive care. Smear was obtained. Malaria smear positive for plasmodium falciparium. LFTs trended down. Bilirubin trended down. Chloroquine and Primaquine were discontinued. Patient was maintained on Malarone. Patient improved clinically and was back to baseline by day of discharge. Patient was discharged on 2 more days of Malarone to complete a course of therapy. Patient is to follow-up with PCP as outpatient.   #2 anemia Likely dilutional in nature. No overt bleeding. Anemia panel consistent with anemia of chronic disease. Outpatient follow-up.  #3 hypokalemia Repleted.  #4 dehydration Patient was  noted to be dehydrated on admission. Patient is diaphoretic throughout. Patient was hydrated with IV fluids and was euvolemic by day of discharge. I  #5 hypertension Continued on home dose Coreg. Patient's Norvasc and Maxzide were held on admission and will  be resumed on discharge.   #6 transaminitis Likely secondary to problem #1. Trending down. Acute hepatitis panel negative. HIV nonreactive. Follow.  #7 diabetes mellitus Oral hypoglycemic agents were held. Patient was maintained on sliding scale insulin.    Procedures: None  Consultations:  Infectious disease: Dr. Tommy Medal 05/11/2015  Discharge Exam: Filed Vitals:   05/12/15 0453  BP: 119/57  Pulse: 76  Temp: 99.2 F (37.3 C)  Resp: 19    General: NAD Cardiovascular: RRR Respiratory: CTAB  Discharge Instructions   Discharge Instructions    Diet Carb Modified    Complete by:  As directed      Discharge instructions    Complete by:  As directed   Follow up with [name], MD in 1-2 weeks.     Increase activity slowly    Complete by:  As directed           Current Discharge Medication List    START taking these medications   Details  atovaquone-proguanil (MALARONE) 250-100 MG TABS Take 4 tablets by mouth at bedtime. Take for 2 days then stop. Qty: 12 tablet, Refills: 0      CONTINUE these medications which have NOT CHANGED   Details  amLODipine (NORVASC) 10 MG tablet Take 10 mg by mouth daily. Refills: 3    carvedilol (COREG) 12.5 MG tablet Take 12.5 mg by mouth 2 (two) times daily. Refills: 1    folic acid (FOLVITE) 1 MG tablet Take 1 mg by mouth daily. Refills: 11    glucose blood test strip Use as instructed Qty: 100 each, Refills: 12    glucose monitoring kit (FREESTYLE) monitoring kit Glucometer- ACCUCHECK BRAND Qty: 1 each, Refills: 0    JANUVIA 100 MG tablet Take 100 mg by mouth daily. Refills: 0    Lancets (ACCU-CHEK MULTICLIX) lancets Use as instructed Qty: 100 each, Refills: 12    lisinopril (PRINIVIL,ZESTRIL) 5 MG tablet Take 5 mg by mouth daily. Refills: 6    metFORMIN (GLUCOPHAGE) 500 MG tablet Take 500 mg by mouth 2 (two) times daily with a meal.    orphenadrine (NORFLEX) 100 MG tablet Take 1 tablet (100 mg total) by  mouth 2 (two) times daily. Qty: 30 tablet, Refills: 0    triamterene-hydrochlorothiazide (MAXZIDE-25) 37.5-25 MG per tablet Take 1 tablet by mouth daily. Refills: 1       No Known Allergies Follow-up Information    Follow up with SANDERS,ROBYN N, MD. Schedule an appointment as soon as possible for a visit in 2 weeks.   Specialty:  Internal Medicine   Why:  f/u in 1-2 weeks   Contact information:   [address] [place] [address] [phone]        The results of significant diagnostics from this hospitalization (including imaging, microbiology, ancillary and laboratory) are listed below for reference.    Significant Diagnostic Studies: Dg Chest 2 View  05/10/2015   CLINICAL DATA:  Fever for 2 days.  EXAM: CHEST  2 VIEW  COMPARISON:  PA and lateral chest 10/29/2013 and 08/12/2013.  FINDINGS: Lung volumes are low but the lungs are clear. Heart size is normal. There is no pneumothorax or pleural effusion. No focal bony abnormality is identified.  IMPRESSION: No  acute finding in a low volume chest.   Electronically Signed   By: Inge Rise M.D.   On: 05/10/2015 15:54    Microbiology: Recent Results (from the past 240 hour(s))  Culture, blood (routine x 2)     Status: None (Preliminary result)   Collection Time: 05/10/15  3:00 PM  Result Value Ref Range Status   Specimen Description BLOOD RIGHT HAND  Final   Special Requests BOTTLES DRAWN AEROBIC AND ANAEROBIC 5CC  Final   Culture NO GROWTH < 24 HOURS  Final   Report Status PENDING  Incomplete  Culture, blood (routine x 2)     Status: None (Preliminary result)   Collection Time: 05/10/15  3:25 PM  Result Value Ref Range Status   Specimen Description BLOOD LEFT HAND  Final   Special Requests BOTTLES DRAWN AEROBIC AND ANAEROBIC 5CC  Final   Culture NO GROWTH < 24 HOURS  Final   Report Status PENDING  Incomplete  Malaria smear     Status: None (Preliminary result)   Collection Time: 05/10/15  3:29 PM   Result Value Ref Range Status   Specimen Description BLOOD  Final   Special Requests NONE  Final   Malaria Prep   Final    PLASMODIUM FALCIPARUM 0.2% PARASITEMIA  CONFIRMED BY PATHOLOGIST DR Ezequiel Ganser WINTER CRITICAL RESULT CALLED TO, READ BACK BY AND VERIFIED WITH: PATTY MOSS/6N  1043AM 05/11/15 BY [facility] PREVIOUSLY RELEASED AS Negative pending thick smear review Performed at [facility]    Report Status PENDING  Incomplete  MRSA PCR Screening     Status: None   Collection Time: 05/11/15  5:30 AM  Result Value Ref Range Status   MRSA by PCR NEGATIVE NEGATIVE Final    Comment:        The GeneXpert MRSA Assay (FDA approved for NASAL specimens only), is one component of a comprehensive MRSA colonization surveillance program. It is not intended to diagnose MRSA infection nor to guide or monitor treatment for MRSA infections.      Labs: Basic Metabolic Panel:  Recent Labs Lab 05/10/15 1529 05/10/15 1725 05/11/15 0430 05/12/15 0410  NA 140  --  139 138  K 3.2*  --  3.4* 3.2*  CL 100*  --  104 105  CO2 28  --  28 27  GLUCOSE 113*  --  126* 102*  BUN 14  --  12 8  CREATININE 1.30*  --  1.12 1.03  CALCIUM 9.3  --  8.2* 8.1*  MG  --  1.7 2.1  --    Liver Function Tests:  Recent Labs Lab 05/10/15 1529 05/11/15 0430  AST 72* 60*  ALT 64* 58  ALKPHOS 59 52  BILITOT 2.0* 1.6*  PROT 7.7 6.4*  ALBUMIN 4.2 3.4*   No results for input(s): LIPASE, AMYLASE in the last 168 hours. No results for input(s): AMMONIA in the last 168 hours. CBC:  Recent Labs Lab 05/10/15 1529 05/11/15 0430 05/12/15 0410  WBC 12.7* 8.4 9.1  NEUTROABS 10.2*  --   --   HGB 12.3* 10.5* 10.1*  HCT 34.5* 29.0* 28.1*  MCV 79.5 77.3* 77.2*  PLT 278 261 243   Cardiac Enzymes: No results for input(s): CKTOTAL, CKMB, CKMBINDEX, TROPONINI in the last 168 hours. BNP: BNP (last 3 results) No results for input(s): BNP in the last 8760 hours.  ProBNP (last 3 results) No results for  input(s): PROBNP in the last 8760 hours.  CBG:  Recent Labs Lab 05/11/15  1245 05/11/15 1714 05/11/15 2127 05/12/15 0753 05/12/15 1223  GLUCAP 92 123* 126* 110* 100*       Signed:  Gyasi Hazzard MD [facility] 05/12/2015, 12:49 PM

## 2015-05-14 LAB — HEMOGLOBIN A1C
Hgb A1c MFr Bld: 4.5 % — ABNORMAL LOW (ref 4.8–5.6)
Mean Plasma Glucose: 82 mg/dL

## 2015-05-15 LAB — GLUCOSE 6 PHOSPHATE DEHYDROGENASE
G6PDH: 2.1 U/g{Hb} — ABNORMAL LOW (ref 4.6–13.5)
Hemoglobin: 10.5 g/dL — ABNORMAL LOW (ref 12.6–17.7)

## 2015-05-15 LAB — CULTURE, BLOOD (ROUTINE X 2)
Culture: NO GROWTH
Culture: NO GROWTH

## 2015-05-15 LAB — MALARIA SMEAR

## 2015-08-20 DIAGNOSIS — M25511 Pain in right shoulder: Secondary | ICD-10-CM | POA: Insufficient documentation

## 2015-08-20 DIAGNOSIS — M8700 Idiopathic aseptic necrosis of unspecified bone: Secondary | ICD-10-CM | POA: Insufficient documentation

## 2015-08-20 DIAGNOSIS — M25512 Pain in left shoulder: Secondary | ICD-10-CM

## 2016-01-07 DIAGNOSIS — H538 Other visual disturbances: Secondary | ICD-10-CM | POA: Diagnosis not present

## 2016-01-07 DIAGNOSIS — E65 Localized adiposity: Secondary | ICD-10-CM | POA: Diagnosis not present

## 2016-01-07 DIAGNOSIS — I119 Hypertensive heart disease without heart failure: Secondary | ICD-10-CM | POA: Diagnosis not present

## 2016-01-07 DIAGNOSIS — E119 Type 2 diabetes mellitus without complications: Secondary | ICD-10-CM | POA: Diagnosis not present

## 2016-01-11 DIAGNOSIS — L731 Pseudofolliculitis barbae: Secondary | ICD-10-CM | POA: Diagnosis not present

## 2016-01-30 DIAGNOSIS — M25511 Pain in right shoulder: Secondary | ICD-10-CM | POA: Diagnosis not present

## 2016-01-30 DIAGNOSIS — M25512 Pain in left shoulder: Secondary | ICD-10-CM | POA: Diagnosis not present

## 2016-02-04 DIAGNOSIS — I119 Hypertensive heart disease without heart failure: Secondary | ICD-10-CM | POA: Diagnosis not present

## 2016-02-04 DIAGNOSIS — M545 Low back pain: Secondary | ICD-10-CM | POA: Diagnosis not present

## 2016-02-04 DIAGNOSIS — E1165 Type 2 diabetes mellitus with hyperglycemia: Secondary | ICD-10-CM | POA: Diagnosis not present

## 2016-02-04 DIAGNOSIS — D571 Sickle-cell disease without crisis: Secondary | ICD-10-CM | POA: Diagnosis not present

## 2016-02-12 ENCOUNTER — Encounter (HOSPITAL_COMMUNITY): Payer: Self-pay | Admitting: *Deleted

## 2016-02-12 ENCOUNTER — Emergency Department (HOSPITAL_COMMUNITY)
Admission: EM | Admit: 2016-02-12 | Discharge: 2016-02-12 | Disposition: A | Discharge status: Home / Self Care | Payer: Commercial Managed Care - HMO | Attending: Emergency Medicine | Admitting: Emergency Medicine

## 2016-02-12 DIAGNOSIS — E119 Type 2 diabetes mellitus without complications: Secondary | ICD-10-CM | POA: Diagnosis not present

## 2016-02-12 DIAGNOSIS — Z79899 Other long term (current) drug therapy: Secondary | ICD-10-CM | POA: Insufficient documentation

## 2016-02-12 DIAGNOSIS — M1612 Unilateral primary osteoarthritis, left hip: Secondary | ICD-10-CM | POA: Diagnosis not present

## 2016-02-12 DIAGNOSIS — Z8659 Personal history of other mental and behavioral disorders: Secondary | ICD-10-CM | POA: Insufficient documentation

## 2016-02-12 DIAGNOSIS — Z791 Long term (current) use of non-steroidal anti-inflammatories (NSAID): Secondary | ICD-10-CM | POA: Insufficient documentation

## 2016-02-12 DIAGNOSIS — D5700 Hb-SS disease with crisis, unspecified: Secondary | ICD-10-CM | POA: Insufficient documentation

## 2016-02-12 DIAGNOSIS — Z7984 Long term (current) use of oral hypoglycemic drugs: Secondary | ICD-10-CM | POA: Diagnosis not present

## 2016-02-12 DIAGNOSIS — M479 Spondylosis, unspecified: Secondary | ICD-10-CM | POA: Insufficient documentation

## 2016-02-12 DIAGNOSIS — I10 Essential (primary) hypertension: Secondary | ICD-10-CM | POA: Insufficient documentation

## 2016-02-12 LAB — BASIC METABOLIC PANEL
Anion gap: 10 (ref 5–15)
BUN: 13 mg/dL (ref 6–20)
CO2: 28 mmol/L (ref 22–32)
Calcium: 9.1 mg/dL (ref 8.9–10.3)
Chloride: 101 mmol/L (ref 101–111)
Creatinine, Ser: 1.35 mg/dL — ABNORMAL HIGH (ref 0.61–1.24)
GFR calc Af Amer: >60 mL/min (ref >60)
GFR calc non Af Amer: >60 mL/min (ref >60)
Glucose, Bld: 144 mg/dL — ABNORMAL HIGH (ref 65–99)
Potassium: 3.3 mmol/L — ABNORMAL LOW (ref 3.5–5.1)
Sodium: 139 mmol/L (ref 135–145)

## 2016-02-12 LAB — CBC WITH DIFFERENTIAL/PLATELET
Basophils Absolute: 0 10*3/uL (ref 0.0–0.1)
Basophils Relative: 0 %
Eosinophils Absolute: 0.4 10*3/uL (ref 0.0–0.7)
Eosinophils Relative: 4 %
HCT: 30.6 % — ABNORMAL LOW (ref 39.0–52.0)
Hemoglobin: 11 g/dL — ABNORMAL LOW (ref 13.0–17.0)
Lymphocytes Relative: 25 %
Lymphs Abs: 2.5 10*3/uL (ref 0.7–4.0)
MCH: 29.7 pg (ref 26.0–34.0)
MCHC: 35.9 g/dL (ref 30.0–36.0)
MCV: 82.7 fL (ref 78.0–100.0)
Monocytes Absolute: 1.1 10*3/uL — ABNORMAL HIGH (ref 0.1–1.0)
Monocytes Relative: 11 %
Neutro Abs: 6.1 10*3/uL (ref 1.7–7.7)
Neutrophils Relative %: 60 %
Platelets: 261 10*3/uL (ref 150–400)
RBC: 3.7 MIL/uL — ABNORMAL LOW (ref 4.22–5.81)
RDW: 17 % — ABNORMAL HIGH (ref 11.5–15.5)
WBC: 10.2 10*3/uL (ref 4.0–10.5)

## 2016-02-12 LAB — RETICULOCYTES
RBC.: 3.7 MIL/uL — ABNORMAL LOW (ref 4.22–5.81)
Retic Count, Absolute: 240.5 10*3/uL — ABNORMAL HIGH (ref 19.0–186.0)
Retic Ct Pct: 6.5 % — ABNORMAL HIGH (ref 0.4–3.1)

## 2016-02-12 MED ORDER — HYDROMORPHONE HCL 1 MG/ML IJ SOLN
1.0000 mg | Freq: Once | INTRAMUSCULAR | Status: AC
  Administered 2016-02-12: 1 mg via INTRAVENOUS
  Filled 2016-02-12: qty 1

## 2016-02-12 MED ORDER — ONDANSETRON HCL 4 MG/2ML IJ SOLN
4.0000 mg | Freq: Once | INTRAMUSCULAR | Status: AC
  Administered 2016-02-12: 4 mg via INTRAVENOUS
  Filled 2016-02-12: qty 2

## 2016-02-12 MED ORDER — OXYCODONE-ACETAMINOPHEN 5-325 MG PO TABS
1.0000 | ORAL_TABLET | Freq: Once | ORAL | Status: AC
  Administered 2016-02-12: 1 via ORAL
  Filled 2016-02-12: qty 1

## 2016-02-12 MED ORDER — SODIUM CHLORIDE 0.9 % IV BOLUS (SEPSIS)
1000.0000 mL | Freq: Once | INTRAVENOUS | Status: AC
  Administered 2016-02-12: 1000 mL via INTRAVENOUS

## 2016-02-12 MED ORDER — KETOROLAC TROMETHAMINE 30 MG/ML IJ SOLN
30.0000 mg | Freq: Once | INTRAMUSCULAR | Status: AC
  Administered 2016-02-12: 30 mg via INTRAVENOUS
  Filled 2016-02-12: qty 1

## 2016-02-12 MED ORDER — DIPHENHYDRAMINE HCL 50 MG/ML IJ SOLN
25.0000 mg | Freq: Once | INTRAMUSCULAR | Status: AC
  Administered 2016-02-12: 25 mg via INTRAVENOUS
  Filled 2016-02-12: qty 1

## 2016-02-12 NOTE — ED Provider Notes (Signed)
CSN: [id number]     Arrival date & time 02/12/16  2774 History   First MD Initiated Contact with Patient 02/12/16 [phone]     Chief Complaint  Patient presents with  . Sickle Cell Pain Crisis     HPI  Nicolas [name] is an 40 y.o. 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 He states that he rarely gets sickle cell pain crises, maybe once every couple of months, and typically his pain is well controlled with oxycodone at home. States his pain in his right arm started yesterday and has been unrelieved by home oxycodone. States this is where his pain is typically located. Denies chest pain, SOB, lightheadedness, weakness. Denies new injury or trauma. He follows heme/onc at [facility] and is a patient of Dr. Lanell Persons and Clenton Pare, PA-C. He states he has an appt at their clinic next week.  Past Medical History  Diagnosis Date  . Hypertension   . Arthritis     arthritis- back & L hip  . Depression     situational depression, pt. out of work   . Osteoarthritis of left hip 06/07/2012  . Diabetes mellitus ([place])   . HTN (hypertension) 05/10/2015   Past Surgical History  Procedure Laterality Date  . Joint replacement      R hip  . Total hip arthroplasty  06/07/2012    Procedure: TOTAL HIP ARTHROPLASTY;  Surgeon: Johnny Bridge, MD;  Location: [facility];  Service: Orthopedics;  Laterality: Left;   Family History  Problem Relation Age of Onset  . CAD Neg Hx   . Cancer - Other Father    Social History  Substance Use Topics  . Smoking status: Never Smoker   . Smokeless tobacco: Never Used  . Alcohol Use: 0.5 oz/week    1 Standard drinks or equivalent per week    Review of Systems  All other systems reviewed and are negative.     Allergies  Review of patient's allergies indicates no known allergies.  Home Medications   Prior to Admission medications   Medication Sig Start Date End  Date Taking? Authorizing Provider  amLODipine (NORVASC) 10 MG tablet Take 10 mg by mouth daily. 11/25/14  Yes Historical Provider, MD  carvedilol (COREG) 25 MG tablet Take 25 mg by mouth 2 (two) times daily. 01/07/16  Yes Historical Provider, MD  Cholecalciferol (VITAMIN D3) 2000 units capsule Take 2,000 Units by mouth daily. 02/05/16  Yes Historical Provider, MD  folic acid (FOLVITE) 1 MG tablet Take 1 mg by mouth daily. 12/23/14  Yes Historical Provider, MD  glucose blood test strip Use as instructed Patient taking differently: 1 each by Other route every other day. Use as instructed 08/15/13  Yes Adeline C Viyuoh, MD  glucose monitoring kit (FREESTYLE) monitoring kit Glucometer- ACCUCHECK BRAND 08/15/13  Yes Adeline C Viyuoh, MD  JANUVIA 100 MG tablet Take 100 mg by mouth daily. 02/08/15  Yes Historical Provider, MD  Lancets (ACCU-CHEK MULTICLIX) lancets Use as instructed Patient taking differently: 1 each by Other route every other day.  08/15/13  Yes Adeline C Viyuoh, MD  lisinopril (PRINIVIL,ZESTRIL) 5 MG tablet Take 5 mg by mouth daily. 04/17/15  Yes Historical Provider, MD  meloxicam (MOBIC) 15 MG tablet Take 15 mg by mouth daily. 01/29/16  Yes Historical Provider, MD  metFORMIN (GLUCOPHAGE) 500 MG tablet Take 500 mg by mouth  2 (two) times daily with a meal.   Yes Historical Provider, MD  orphenadrine (NORFLEX) 100 MG tablet Take 1 tablet (100 mg total) by mouth 2 (two) times daily. 01/11/15  Yes Charlesetta Shanks, MD  triamterene-hydrochlorothiazide (MAXZIDE-25) 37.5-25 MG per tablet Take 1 tablet by mouth daily. 12/30/14  Yes Historical Provider, MD  atovaquone-proguanil (MALARONE) 250-100 MG TABS Take 4 tablets by mouth at bedtime. Take for 2 days then stop. Patient not taking: Reported on 02/12/2016 05/12/15   Eugenie Filler, MD   BP 127/73 mmHg  Pulse 78  Temp(Src) 98.7 F (37.1 C) (Oral)  Resp 20  SpO2 99% Physical Exam  Constitutional: He is oriented to person, place, and time.  HENT:   Right Ear: External ear normal.  Left Ear: External ear normal.  Nose: Nose normal.  Mouth/Throat: Oropharynx is clear and moist. No oropharyngeal exudate.  Eyes: Conjunctivae and EOM are normal. Pupils are equal, round, and reactive to light.  Neck: Normal range of motion. Neck supple.  Cardiovascular: Normal rate, regular rhythm, normal heart sounds and intact distal pulses.   Pulmonary/Chest: Effort normal and breath sounds normal. No respiratory distress. He has no wheezes. He exhibits no tenderness.  Abdominal: Soft. Bowel sounds are normal. He exhibits no distension. There is no tenderness. There is no rebound and no guarding.  Musculoskeletal: He exhibits no edema.  Neurological: He is alert and oriented to person, place, and time. No cranial nerve deficit.  Skin: Skin is warm and dry.  Psychiatric: He has a normal mood and affect.  Nursing note and vitals reviewed.   ED Course  Procedures (including critical care time) Labs Review Labs Reviewed  BASIC METABOLIC PANEL - Abnormal; Notable for the following:    Potassium 3.3 (*)    Glucose, Bld 144 (*)    Creatinine, Ser 1.35 (*)    All other components within normal limits  CBC WITH DIFFERENTIAL/PLATELET - Abnormal; Notable for the following:    RBC 3.70 (*)    Hemoglobin 11.0 (*)    HCT 30.6 (*)    RDW 17.0 (*)    Monocytes Absolute 1.1 (*)    All other components within normal limits  RETICULOCYTES - Abnormal; Notable for the following:    Retic Ct Pct 6.5 (*)    RBC. 3.70 (*)    Retic Count, Manual 240.5 (*)    All other components within normal limits    Imaging Review No results found. I have personally reviewed and evaluated these images and lab results as part of my medical decision-making.   EKG Interpretation None      MDM   Final diagnoses:  Sickle-cell disease with pain (HCC)    Pain improved with IV pain meds and fluids. Labs at baseline with no e/o aplastic crisis. Will give dose of home  oxycodone prior to discharge. Instructed to f/u with heme/onc next week as scheduled as well as close PCP f/u. Pt nontoxic appearing and VSS. No concern for acute chest. Stable for discharge. ER return precautions given.    Anne Ng, PA-C 02/12/16 1231  Harvel Quale, MD 02/14/16 [phone]

## 2016-02-12 NOTE — Discharge Instructions (Signed)
Please call your primary care provider to schedule a follow up appointment. [facility]. Take your home medications as prescribed as needed for pain.

## 2016-02-12 NOTE — ED Notes (Signed)
Pt arrives via POV with c/o sickle cell pain in his right arm. Pt states he has chronic back pain, but denies any other sx rt crisis.

## 2016-02-18 DIAGNOSIS — Z23 Encounter for immunization: Secondary | ICD-10-CM | POA: Diagnosis not present

## 2016-02-18 DIAGNOSIS — D5720 Sickle-cell/Hb-C disease without crisis: Secondary | ICD-10-CM | POA: Diagnosis not present

## 2016-02-19 DIAGNOSIS — L731 Pseudofolliculitis barbae: Secondary | ICD-10-CM | POA: Diagnosis not present

## 2016-03-27 IMAGING — CR DG LUMBAR SPINE COMPLETE 4+V
5 series · 5 of 5 positions shown · non-contrast
Comparison: Hip 06/07/2012.

CLINICAL DATA: Injury 5018 with worsening pain.

EXAM:
LUMBAR SPINE - COMPLETE 4+ VIEW

[t lumbar spine ap]
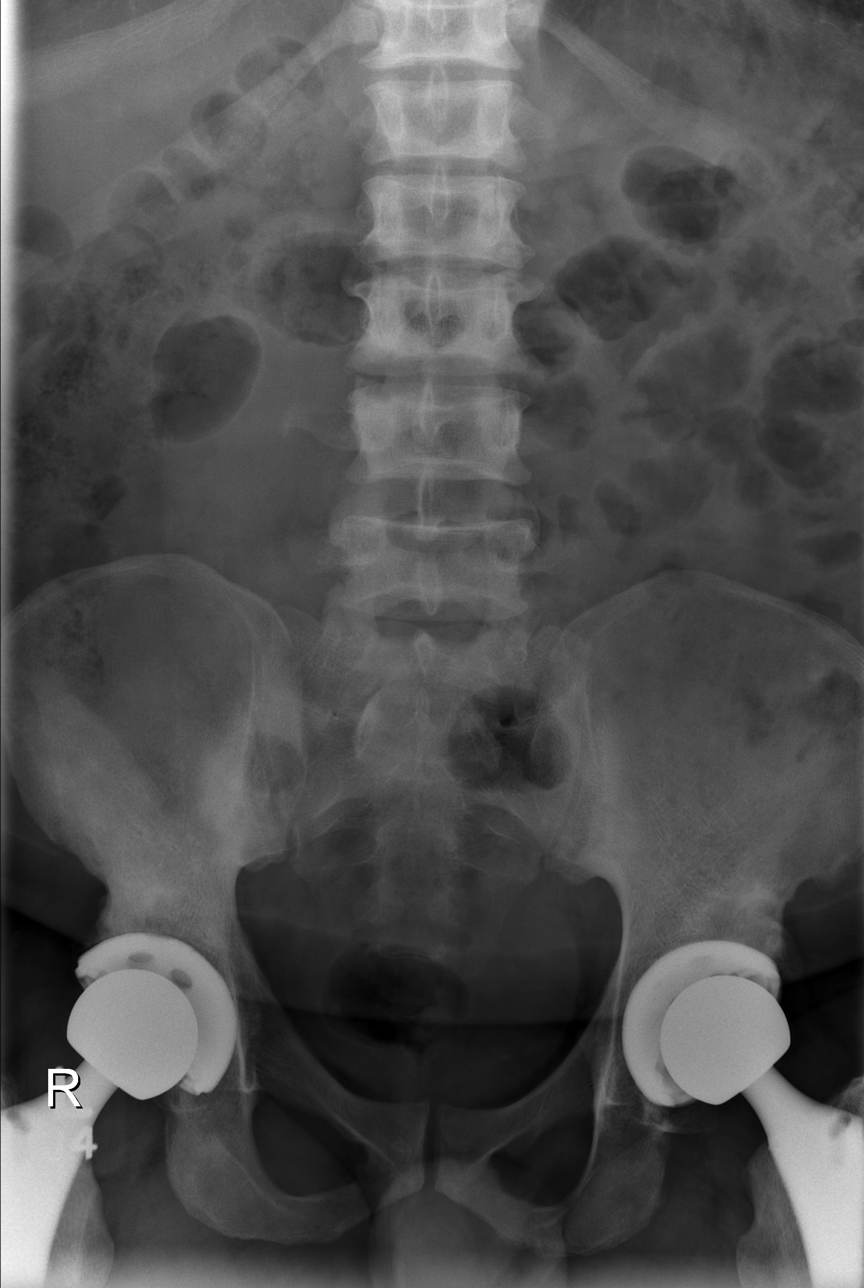

[t lumbar spine obl (1 of 2)]
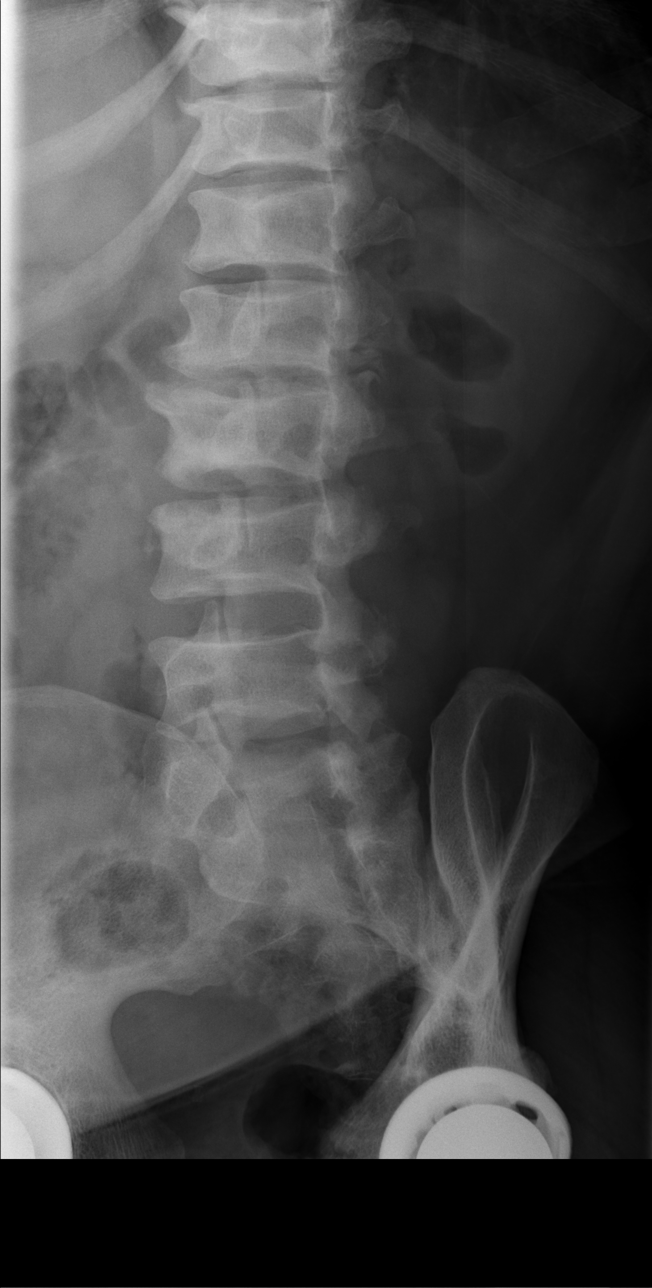

[t lumbar spine obl (2 of 2)]
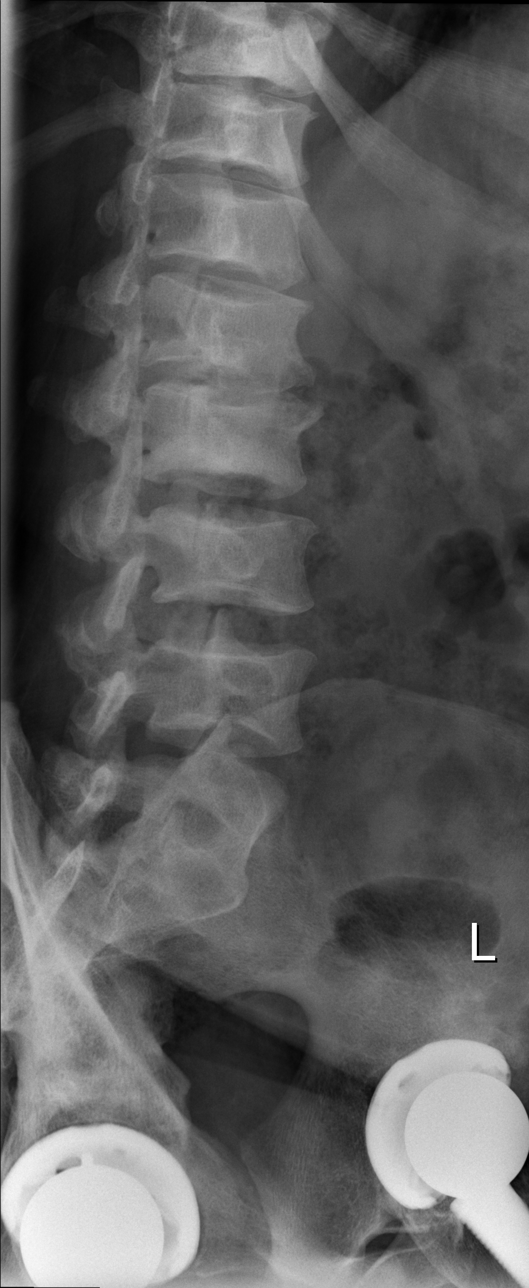

[t lumbar spine lat]
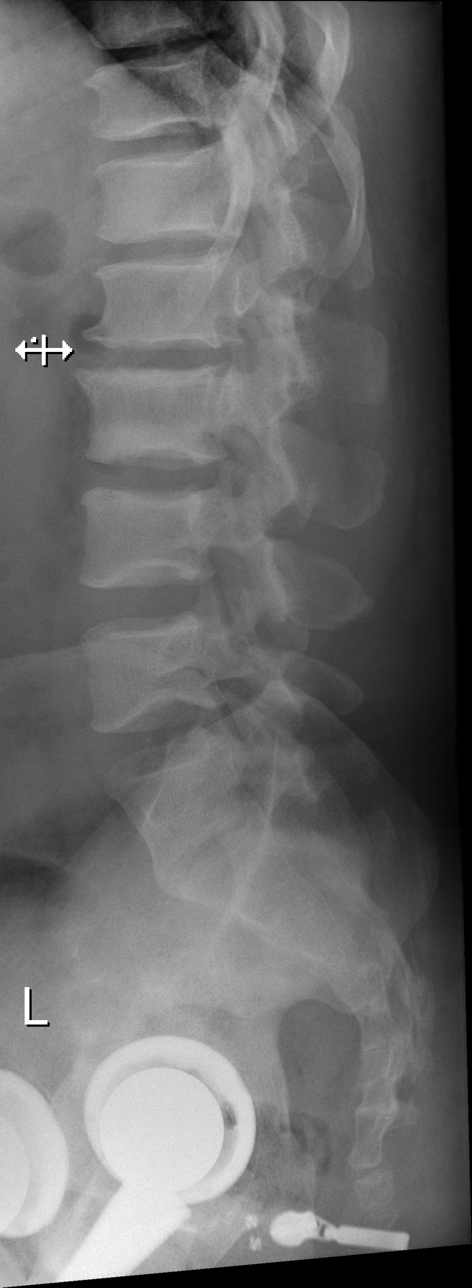

[t lumbar l-5 s-1 spot]
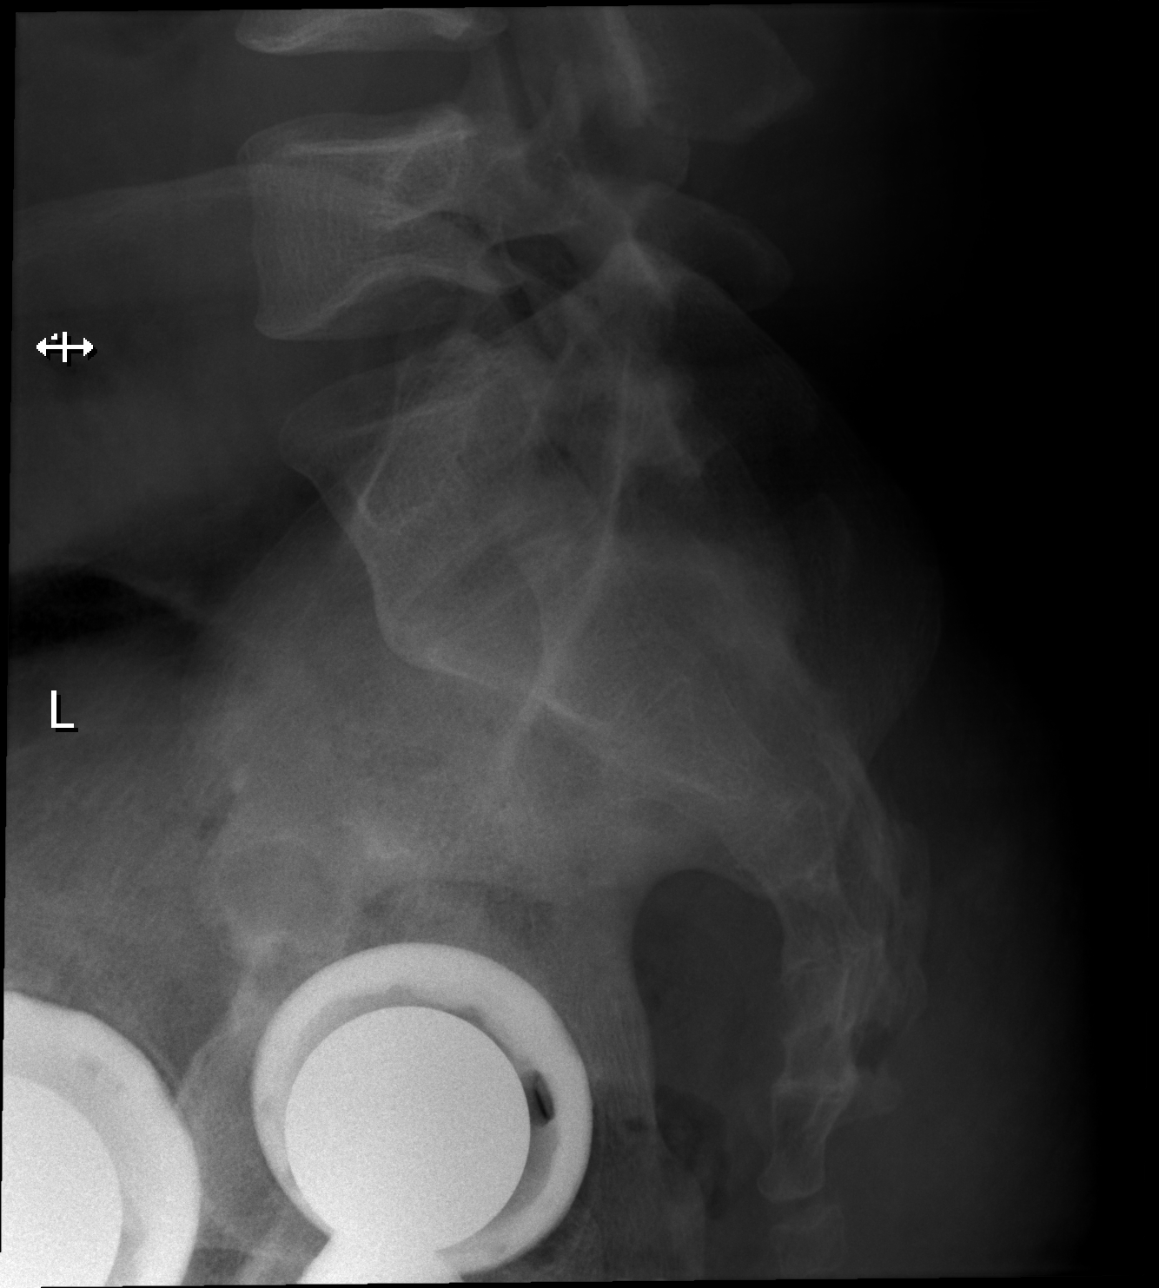

[5 of 5 positions shown; findings below may reference images not displayed]

FINDINGS: Vertebral body alignment and heights are normal. There is mild
spondylosis throughout the lumbar spine. There is minimal disc space
narrowing at the L2-3 level. There is no compression fracture or
subluxation. Subtle stable sclerosis over the sacroiliac joints.
Bilateral total hip arthroplasties are unchanged.
IMPRESSION: No acute findings.

Mild spondylosis of the lumbar spine with minimal disc disease at
the L2-3 level.

## 2016-06-25 DIAGNOSIS — M25511 Pain in right shoulder: Secondary | ICD-10-CM | POA: Diagnosis not present

## 2016-06-25 DIAGNOSIS — M25512 Pain in left shoulder: Secondary | ICD-10-CM | POA: Diagnosis not present

## 2016-06-26 DIAGNOSIS — M791 Myalgia: Secondary | ICD-10-CM | POA: Diagnosis not present

## 2016-06-26 DIAGNOSIS — Z7984 Long term (current) use of oral hypoglycemic drugs: Secondary | ICD-10-CM | POA: Diagnosis not present

## 2016-06-26 DIAGNOSIS — M87012 Idiopathic aseptic necrosis of left shoulder: Secondary | ICD-10-CM | POA: Diagnosis not present

## 2016-06-26 DIAGNOSIS — M87011 Idiopathic aseptic necrosis of right shoulder: Secondary | ICD-10-CM | POA: Diagnosis not present

## 2016-06-26 DIAGNOSIS — M545 Low back pain: Secondary | ICD-10-CM | POA: Diagnosis not present

## 2016-06-26 DIAGNOSIS — Z6834 Body mass index (BMI) 34.0-34.9, adult: Secondary | ICD-10-CM | POA: Diagnosis not present

## 2016-06-26 DIAGNOSIS — M8700 Idiopathic aseptic necrosis of unspecified bone: Secondary | ICD-10-CM | POA: Diagnosis not present

## 2016-06-26 DIAGNOSIS — D5720 Sickle-cell/Hb-C disease without crisis: Secondary | ICD-10-CM | POA: Diagnosis not present

## 2016-06-26 DIAGNOSIS — E119 Type 2 diabetes mellitus without complications: Secondary | ICD-10-CM | POA: Diagnosis not present

## 2016-06-26 DIAGNOSIS — M25512 Pain in left shoulder: Secondary | ICD-10-CM | POA: Diagnosis not present

## 2016-06-26 DIAGNOSIS — E559 Vitamin D deficiency, unspecified: Secondary | ICD-10-CM | POA: Diagnosis not present

## 2016-06-26 DIAGNOSIS — M25511 Pain in right shoulder: Secondary | ICD-10-CM | POA: Diagnosis not present

## 2016-06-26 DIAGNOSIS — G8929 Other chronic pain: Secondary | ICD-10-CM | POA: Diagnosis not present

## 2016-07-02 DIAGNOSIS — L731 Pseudofolliculitis barbae: Secondary | ICD-10-CM | POA: Diagnosis not present

## 2016-07-07 DIAGNOSIS — I119 Hypertensive heart disease without heart failure: Secondary | ICD-10-CM | POA: Diagnosis not present

## 2016-07-07 DIAGNOSIS — E65 Localized adiposity: Secondary | ICD-10-CM | POA: Diagnosis not present

## 2016-07-23 DIAGNOSIS — Z79899 Other long term (current) drug therapy: Secondary | ICD-10-CM | POA: Diagnosis not present

## 2016-07-23 DIAGNOSIS — Z7984 Long term (current) use of oral hypoglycemic drugs: Secondary | ICD-10-CM | POA: Diagnosis not present

## 2016-07-23 DIAGNOSIS — Z8619 Personal history of other infectious and parasitic diseases: Secondary | ICD-10-CM | POA: Diagnosis not present

## 2016-07-23 DIAGNOSIS — R801 Persistent proteinuria, unspecified: Secondary | ICD-10-CM | POA: Diagnosis not present

## 2016-07-23 DIAGNOSIS — N189 Chronic kidney disease, unspecified: Secondary | ICD-10-CM | POA: Diagnosis not present

## 2016-07-23 DIAGNOSIS — D571 Sickle-cell disease without crisis: Secondary | ICD-10-CM | POA: Diagnosis not present

## 2016-07-23 DIAGNOSIS — E1122 Type 2 diabetes mellitus with diabetic chronic kidney disease: Secondary | ICD-10-CM | POA: Diagnosis not present

## 2016-07-23 DIAGNOSIS — I129 Hypertensive chronic kidney disease with stage 1 through stage 4 chronic kidney disease, or unspecified chronic kidney disease: Secondary | ICD-10-CM | POA: Diagnosis not present

## 2016-09-17 DIAGNOSIS — M19012 Primary osteoarthritis, left shoulder: Secondary | ICD-10-CM | POA: Diagnosis not present

## 2016-09-17 DIAGNOSIS — M19011 Primary osteoarthritis, right shoulder: Secondary | ICD-10-CM | POA: Diagnosis not present

## 2016-09-22 ENCOUNTER — Emergency Department (HOSPITAL_COMMUNITY): Payer: Commercial Managed Care - HMO

## 2016-09-22 ENCOUNTER — Encounter (HOSPITAL_COMMUNITY): Payer: Self-pay

## 2016-09-22 ENCOUNTER — Inpatient Hospital Stay (HOSPITAL_COMMUNITY): Payer: Commercial Managed Care - HMO

## 2016-09-22 ENCOUNTER — Inpatient Hospital Stay (HOSPITAL_COMMUNITY)
Admission: EM | Admit: 2016-09-22 | Discharge: 2016-09-29 | DRG: 871 | Disposition: A | Discharge status: Home / Self Care | Payer: Commercial Managed Care - HMO | Attending: Internal Medicine | Admitting: Internal Medicine

## 2016-09-22 DIAGNOSIS — D5701 Hb-SS disease with acute chest syndrome: Secondary | ICD-10-CM

## 2016-09-22 DIAGNOSIS — J810 Acute pulmonary edema: Secondary | ICD-10-CM | POA: Diagnosis not present

## 2016-09-22 DIAGNOSIS — R0902 Hypoxemia: Secondary | ICD-10-CM

## 2016-09-22 DIAGNOSIS — Z7984 Long term (current) use of oral hypoglycemic drugs: Secondary | ICD-10-CM | POA: Diagnosis not present

## 2016-09-22 DIAGNOSIS — A419 Sepsis, unspecified organism: Principal | ICD-10-CM | POA: Diagnosis present

## 2016-09-22 DIAGNOSIS — J189 Pneumonia, unspecified organism: Secondary | ICD-10-CM | POA: Diagnosis present

## 2016-09-22 DIAGNOSIS — D57211 Sickle-cell/Hb-C disease with acute chest syndrome: Secondary | ICD-10-CM | POA: Diagnosis present

## 2016-09-22 DIAGNOSIS — R748 Abnormal levels of other serum enzymes: Secondary | ICD-10-CM

## 2016-09-22 DIAGNOSIS — R079 Chest pain, unspecified: Secondary | ICD-10-CM

## 2016-09-22 DIAGNOSIS — Z809 Family history of malignant neoplasm, unspecified: Secondary | ICD-10-CM

## 2016-09-22 DIAGNOSIS — I10 Essential (primary) hypertension: Secondary | ICD-10-CM | POA: Diagnosis not present

## 2016-09-22 DIAGNOSIS — N179 Acute kidney failure, unspecified: Secondary | ICD-10-CM | POA: Diagnosis present

## 2016-09-22 DIAGNOSIS — R509 Fever, unspecified: Secondary | ICD-10-CM | POA: Diagnosis not present

## 2016-09-22 DIAGNOSIS — R072 Precordial pain: Secondary | ICD-10-CM | POA: Diagnosis not present

## 2016-09-22 DIAGNOSIS — R0789 Other chest pain: Secondary | ICD-10-CM

## 2016-09-22 DIAGNOSIS — G8929 Other chronic pain: Secondary | ICD-10-CM | POA: Diagnosis present

## 2016-09-22 DIAGNOSIS — M79609 Pain in unspecified limb: Secondary | ICD-10-CM | POA: Diagnosis not present

## 2016-09-22 DIAGNOSIS — M545 Low back pain, unspecified: Secondary | ICD-10-CM | POA: Diagnosis present

## 2016-09-22 DIAGNOSIS — D5700 Hb-SS disease with crisis, unspecified: Secondary | ICD-10-CM

## 2016-09-22 DIAGNOSIS — R7989 Other specified abnormal findings of blood chemistry: Secondary | ICD-10-CM

## 2016-09-22 DIAGNOSIS — J9601 Acute respiratory failure with hypoxia: Secondary | ICD-10-CM

## 2016-09-22 DIAGNOSIS — Z79891 Long term (current) use of opiate analgesic: Secondary | ICD-10-CM | POA: Diagnosis not present

## 2016-09-22 DIAGNOSIS — Y95 Nosocomial condition: Secondary | ICD-10-CM

## 2016-09-22 DIAGNOSIS — Z7401 Bed confinement status: Secondary | ICD-10-CM

## 2016-09-22 DIAGNOSIS — E1122 Type 2 diabetes mellitus with diabetic chronic kidney disease: Secondary | ICD-10-CM

## 2016-09-22 DIAGNOSIS — E119 Type 2 diabetes mellitus without complications: Secondary | ICD-10-CM

## 2016-09-22 DIAGNOSIS — E860 Dehydration: Secondary | ICD-10-CM | POA: Diagnosis present

## 2016-09-22 DIAGNOSIS — R918 Other nonspecific abnormal finding of lung field: Secondary | ICD-10-CM | POA: Diagnosis not present

## 2016-09-22 DIAGNOSIS — D696 Thrombocytopenia, unspecified: Secondary | ICD-10-CM | POA: Diagnosis not present

## 2016-09-22 DIAGNOSIS — E669 Obesity, unspecified: Secondary | ICD-10-CM | POA: Diagnosis present

## 2016-09-22 DIAGNOSIS — R945 Abnormal results of liver function studies: Secondary | ICD-10-CM

## 2016-09-22 DIAGNOSIS — E876 Hypokalemia: Secondary | ICD-10-CM | POA: Diagnosis present

## 2016-09-22 DIAGNOSIS — D57219 Sickle-cell/Hb-C disease with crisis, unspecified: Secondary | ICD-10-CM | POA: Diagnosis not present

## 2016-09-22 DIAGNOSIS — D571 Sickle-cell disease without crisis: Secondary | ICD-10-CM | POA: Diagnosis present

## 2016-09-22 DIAGNOSIS — I119 Hypertensive heart disease without heart failure: Secondary | ICD-10-CM | POA: Diagnosis present

## 2016-09-22 DIAGNOSIS — E11319 Type 2 diabetes mellitus with unspecified diabetic retinopathy without macular edema: Secondary | ICD-10-CM | POA: Diagnosis present

## 2016-09-22 DIAGNOSIS — R0602 Shortness of breath: Secondary | ICD-10-CM | POA: Diagnosis not present

## 2016-09-22 DIAGNOSIS — Z96643 Presence of artificial hip joint, bilateral: Secondary | ICD-10-CM | POA: Diagnosis not present

## 2016-09-22 DIAGNOSIS — Z6835 Body mass index (BMI) 35.0-35.9, adult: Secondary | ICD-10-CM | POA: Diagnosis not present

## 2016-09-22 DIAGNOSIS — R778 Other specified abnormalities of plasma proteins: Secondary | ICD-10-CM | POA: Diagnosis present

## 2016-09-22 DIAGNOSIS — R5081 Fever presenting with conditions classified elsewhere: Secondary | ICD-10-CM | POA: Diagnosis not present

## 2016-09-22 DIAGNOSIS — Z4901 Encounter for fitting and adjustment of extracorporeal dialysis catheter: Secondary | ICD-10-CM | POA: Diagnosis not present

## 2016-09-22 DIAGNOSIS — N1831 Type 2 diabetes mellitus with diabetic chronic kidney disease: Secondary | ICD-10-CM

## 2016-09-22 HISTORY — DX: Sickle-cell disease without crisis: D57.1

## 2016-09-22 LAB — CBC WITH DIFFERENTIAL/PLATELET
Basophils Absolute: 0 10*3/uL (ref 0.0–0.1)
Basophils Relative: 0 %
Eosinophils Absolute: 0 10*3/uL (ref 0.0–0.7)
Eosinophils Relative: 0 %
HCT: 32.2 % — ABNORMAL LOW (ref 39.0–52.0)
Hemoglobin: 11.6 g/dL — ABNORMAL LOW (ref 13.0–17.0)
Lymphocytes Relative: 23 %
Lymphs Abs: 3.1 10*3/uL (ref 0.7–4.0)
MCH: 30 pg (ref 26.0–34.0)
MCHC: 36 g/dL (ref 30.0–36.0)
MCV: 83.2 fL (ref 78.0–100.0)
Monocytes Absolute: 1.3 10*3/uL — ABNORMAL HIGH (ref 0.1–1.0)
Monocytes Relative: 10 %
Neutro Abs: 9 10*3/uL — ABNORMAL HIGH (ref 1.7–7.7)
Neutrophils Relative %: 67 %
Platelets: 275 10*3/uL (ref 150–400)
RBC: 3.87 MIL/uL — ABNORMAL LOW (ref 4.22–5.81)
RDW: 16.8 % — ABNORMAL HIGH (ref 11.5–15.5)
WBC: 13.4 10*3/uL — ABNORMAL HIGH (ref 4.0–10.5)

## 2016-09-22 LAB — COMPREHENSIVE METABOLIC PANEL
ALT: 158 U/L — ABNORMAL HIGH (ref 17–63)
ALT: 152 U/L — ABNORMAL HIGH (ref 17–63)
AST: 91 U/L — ABNORMAL HIGH (ref 15–41)
AST: 99 U/L — ABNORMAL HIGH (ref 15–41)
Albumin: 4.6 g/dL (ref 3.5–5.0)
Albumin: 4.8 g/dL (ref 3.5–5.0)
Alkaline Phosphatase: 49 U/L (ref 38–126)
Alkaline Phosphatase: 54 U/L (ref 38–126)
Anion gap: 13 (ref 5–15)
Anion gap: 11 (ref 5–15)
BUN: 23 mg/dL — ABNORMAL HIGH (ref 6–20)
BUN: 21 mg/dL — ABNORMAL HIGH (ref 6–20)
CO2: 24 mmol/L (ref 22–32)
CO2: 28 mmol/L (ref 22–32)
Calcium: 9.8 mg/dL (ref 8.9–10.3)
Calcium: 10 mg/dL (ref 8.9–10.3)
Chloride: 107 mmol/L (ref 101–111)
Chloride: 103 mmol/L (ref 101–111)
Creatinine, Ser: 1.6 mg/dL — ABNORMAL HIGH (ref 0.61–1.24)
Creatinine, Ser: 1.18 mg/dL (ref 0.61–1.24)
GFR calc Af Amer: 60 mL/min — ABNORMAL LOW (ref >60)
GFR calc Af Amer: >60 mL/min (ref >60)
GFR calc non Af Amer: 52 mL/min — ABNORMAL LOW (ref >60)
GFR calc non Af Amer: >60 mL/min (ref >60)
Glucose, Bld: 113 mg/dL — ABNORMAL HIGH (ref 65–99)
Glucose, Bld: 115 mg/dL — ABNORMAL HIGH (ref 65–99)
Potassium: 3.1 mmol/L — ABNORMAL LOW (ref 3.5–5.1)
Potassium: 3.3 mmol/L — ABNORMAL LOW (ref 3.5–5.1)
Sodium: 144 mmol/L (ref 135–145)
Sodium: 142 mmol/L (ref 135–145)
Total Bilirubin: 1.2 mg/dL (ref 0.3–1.2)
Total Bilirubin: 1.7 mg/dL — ABNORMAL HIGH (ref 0.3–1.2)
Total Protein: 7.4 g/dL (ref 6.5–8.1)
Total Protein: 7.8 g/dL (ref 6.5–8.1)

## 2016-09-22 LAB — URINALYSIS, ROUTINE W REFLEX MICROSCOPIC
Bacteria, UA: NONE SEEN
Bilirubin Urine: NEGATIVE
Glucose, UA: NEGATIVE mg/dL
Hgb urine dipstick: NEGATIVE
Ketones, ur: NEGATIVE mg/dL
Leukocytes, UA: NEGATIVE
Nitrite: NEGATIVE
Protein, ur: 30 mg/dL — AB
Specific Gravity, Urine: 1.009 (ref 1.005–1.030)
Squamous Epithelial / LPF: NONE SEEN
pH: 5 (ref 5.0–8.0)

## 2016-09-22 LAB — D-DIMER, QUANTITATIVE: D-Dimer, Quant: 1.14 ug/mL-FEU — ABNORMAL HIGH (ref 0.00–0.50)

## 2016-09-22 LAB — TROPONIN I
Troponin I: 0.04 ng/mL (ref <0.03)
Troponin I: 0.08 ng/mL (ref <0.03)
Troponin I: 0.09 ng/mL (ref <0.03)
Troponin I: 0.05 ng/mL (ref <0.03)

## 2016-09-22 LAB — MAGNESIUM: Magnesium: 1.9 mg/dL (ref 1.7–2.4)

## 2016-09-22 LAB — RETICULOCYTES
RBC.: 3.87 MIL/uL — ABNORMAL LOW (ref 4.22–5.81)
Retic Count, Absolute: 286.4 10*3/uL — ABNORMAL HIGH (ref 19.0–186.0)
Retic Ct Pct: 7.4 % — ABNORMAL HIGH (ref 0.4–3.1)

## 2016-09-22 LAB — GLUCOSE, CAPILLARY
Glucose-Capillary: 112 mg/dL — ABNORMAL HIGH (ref 65–99)
Glucose-Capillary: 160 mg/dL — ABNORMAL HIGH (ref 65–99)

## 2016-09-22 MED ORDER — HYDRALAZINE HCL 20 MG/ML IJ SOLN
5.0000 mg | Freq: Once | INTRAMUSCULAR | Status: AC
  Administered 2016-09-22: 5 mg via INTRAVENOUS
  Filled 2016-09-22: qty 1

## 2016-09-22 MED ORDER — HYDROMORPHONE HCL 2 MG/ML IJ SOLN: 0.5000 mg | INTRAMUSCULAR | Status: AC

## 2016-09-22 MED ORDER — SODIUM CHLORIDE 0.9% FLUSH: 9.0000 mL | INTRAVENOUS | Status: DC | PRN

## 2016-09-22 MED ORDER — ENOXAPARIN SODIUM 40 MG/0.4ML ~~LOC~~ SOLN
40.0000 mg | SUBCUTANEOUS | Status: DC
  Filled 2016-09-22: qty 0.4

## 2016-09-22 MED ORDER — SENNOSIDES-DOCUSATE SODIUM 8.6-50 MG PO TABS
1.0000 | ORAL_TABLET | Freq: Two times a day (BID) | ORAL | Status: DC
  Administered 2016-09-22 – 2016-09-29 (×14): 1 via ORAL
  Filled 2016-09-22 (×14): qty 1

## 2016-09-22 MED ORDER — ASPIRIN 81 MG PO CHEW
324.0000 mg | CHEWABLE_TABLET | Freq: Once | ORAL | Status: AC
  Administered 2016-09-22: 324 mg via ORAL
  Filled 2016-09-22: qty 4

## 2016-09-22 MED ORDER — NALOXONE HCL 0.4 MG/ML IJ SOLN: 0.4000 mg | INTRAMUSCULAR | Status: DC | PRN

## 2016-09-22 MED ORDER — INSULIN ASPART 100 UNIT/ML ~~LOC~~ SOLN
0.0000 [IU] | Freq: Three times a day (TID) | SUBCUTANEOUS | Status: DC
  Administered 2016-09-26: 3 [IU] via SUBCUTANEOUS
  Administered 2016-09-26 – 2016-09-29 (×7): 2 [IU] via SUBCUTANEOUS

## 2016-09-22 MED ORDER — POTASSIUM CHLORIDE CRYS ER 20 MEQ PO TBCR
40.0000 meq | EXTENDED_RELEASE_TABLET | Freq: Once | ORAL | Status: AC
  Administered 2016-09-22: 40 meq via ORAL
  Filled 2016-09-22: qty 2

## 2016-09-22 MED ORDER — AMLODIPINE BESYLATE 10 MG PO TABS
10.0000 mg | ORAL_TABLET | Freq: Every day | ORAL | Status: DC
  Administered 2016-09-22 – 2016-09-24 (×3): 10 mg via ORAL
  Filled 2016-09-22 (×2): qty 1
  Filled 2016-09-22: qty 2

## 2016-09-22 MED ORDER — SODIUM CHLORIDE 0.45 % IV SOLN
INTRAVENOUS | Status: DC
  Administered 2016-09-22 – 2016-09-24 (×5): via INTRAVENOUS

## 2016-09-22 MED ORDER — DIPHENHYDRAMINE HCL 12.5 MG/5ML PO ELIX: 12.5000 mg | ORAL_SOLUTION | Freq: Four times a day (QID) | ORAL | Status: DC | PRN

## 2016-09-22 MED ORDER — POLYETHYLENE GLYCOL 3350 17 G PO PACK: 17.0000 g | PACK | Freq: Every day | ORAL | Status: DC | PRN

## 2016-09-22 MED ORDER — HYDROMORPHONE HCL 2 MG/ML IJ SOLN
0.5000 mg | INTRAMUSCULAR | Status: AC
  Administered 2016-09-22: 1 mg via INTRAVENOUS

## 2016-09-22 MED ORDER — DEXTROSE-NACL 5-0.45 % IV SOLN
INTRAVENOUS | Status: DC
Start: 2016-09-22 — End: 2016-09-22
  Administered 2016-09-22: 04:00:00 via INTRAVENOUS

## 2016-09-22 MED ORDER — HYDROMORPHONE HCL 2 MG/ML IJ SOLN: 0.5000 mg | INTRAMUSCULAR | Status: DC

## 2016-09-22 MED ORDER — INSULIN ASPART 100 UNIT/ML ~~LOC~~ SOLN: 0.0000 [IU] | Freq: Every day | SUBCUTANEOUS | Status: DC

## 2016-09-22 MED ORDER — DIPHENHYDRAMINE HCL 50 MG/ML IJ SOLN: 12.5000 mg | Freq: Four times a day (QID) | INTRAMUSCULAR | Status: DC | PRN

## 2016-09-22 MED ORDER — CARVEDILOL 25 MG PO TABS
25.0000 mg | ORAL_TABLET | Freq: Two times a day (BID) | ORAL | Status: DC
  Administered 2016-09-22 – 2016-09-29 (×15): 25 mg via ORAL
  Filled 2016-09-22 (×4): qty 1
  Filled 2016-09-22: qty 2
  Filled 2016-09-22 (×10): qty 1

## 2016-09-22 MED ORDER — ONDANSETRON HCL 4 MG/2ML IJ SOLN: 4.0000 mg | Freq: Four times a day (QID) | INTRAMUSCULAR | Status: DC | PRN

## 2016-09-22 MED ORDER — HYDROMORPHONE HCL 2 MG/ML IJ SOLN
0.5000 mg | INTRAMUSCULAR | Status: DC
  Administered 2016-09-22 (×5): 1 mg via INTRAVENOUS
  Filled 2016-09-22 (×4): qty 1

## 2016-09-22 MED ORDER — HYDRALAZINE HCL 25 MG PO TABS
25.0000 mg | ORAL_TABLET | Freq: Three times a day (TID) | ORAL | Status: DC
  Administered 2016-09-22 – 2016-09-24 (×7): 25 mg via ORAL
  Filled 2016-09-22 (×7): qty 1

## 2016-09-22 MED ORDER — HYDROMORPHONE 1 MG/ML IV SOLN
INTRAVENOUS | Status: DC
  Administered 2016-09-22: 16:00:00 via INTRAVENOUS
  Administered 2016-09-25 (×2): 0.3 mg via INTRAVENOUS
  Administered 2016-09-26: 0 mg via INTRAVENOUS
  Filled 2016-09-22: qty 25

## 2016-09-22 MED ORDER — IOPAMIDOL (ISOVUE-370) INJECTION 76%
INTRAVENOUS | Status: AC
  Administered 2016-09-22: 100 mL
  Filled 2016-09-22: qty 100

## 2016-09-22 MED ORDER — HYDROMORPHONE HCL 2 MG/ML IJ SOLN
0.5000 mg | INTRAMUSCULAR | Status: AC
  Administered 2016-09-22: 1 mg via INTRAVENOUS
  Filled 2016-09-22: qty 1

## 2016-09-22 MED ORDER — HYDROMORPHONE HCL 2 MG/ML IJ SOLN
0.5000 mg | INTRAMUSCULAR | Status: DC
  Filled 2016-09-22: qty 1

## 2016-09-22 NOTE — Progress Notes (Signed)
[facility]                                                                              Patient Demographics  Nicolas [name], is a 40 y.o. male, DOB - 02/20/1976, ZOX:[id number]  Admit date - 09/22/2016   Admitting Physician Alberteen Samhristopher P Danford, MD  Outpatient Primary MD for the patient is Gwynneth AlimentSANDERS,ROBYN N, MD  Outpatient specialists:   LOS - 0  days    Chief Complaint  Patient presents with  . Sickle Cell Pain Crisis       Brief summary  Patient is a 40 y.o. male with  sickle cell disease and NIDDM and HTN presented with severe knee pain, low back pain and chest pain.  The patient was in his usual state of health until several hours before arrival when he had severe onset of knee pain, low back pain, and chest pain. The chest pain is aching, constant, severe, and not typical of his sickle cell. He has no fever, sputum. The knee pain and low back pain are typical of his sickle cell pain, however this did not respond to his home Percocet so he came to the emergency room.  Assessment & Plan      Vaso-occlussive sickle cell pain crisis: - Continue per sickle cell protocol for pain control.  No evidence of acute chest at this time. - Placed on IV hydromorphone PCA with continuous ETCO2 monitoring -Hold home Percocet (only takes 2-3 per day), IV Toradol contraindicated with troponin leak and AKI -Hydrate with IVF .45% saline @ 100 cc/hr  -Diphenhydramine, ondansetron and bowel regimen   Elevated troponin:  - Currently patient states chest pain is improving however not typical of sickle cell crisis  -Troponins are trending up, cardiology consulted  - D-dimer elevated, as CT angiogram of the chest obtained, negative for pulmonary embolism  - EKG with repolarization changes  Sickle cell anemia:  - Baseline Hgb 11 -Transfuse as needed if Hg drops significantly below baseline.   Acute kidney injury:  - Likely due to #1, dehydration, hold lisinopril, HCTZ -  placed on IV fluid hydration, follow BMET   Elevated LFTs:  Have been elevated in past.  Hep serologies negative in 2016. No previous liver imaging.  No abdominal pain at all. -Trend LFT  NIDDM:  -Hold home metformin and Januvia -Continue sliding scale insulin  Hypertension:  -Continue home amlodipine, HCTZ, triamterene, carvedilol -Hold lisinopril, HCTZ given AKI   Code Status: Full CODE STATUS DVT Prophylaxis:  Lovenox Family Communication: Discussed in detail with the patient, all imaging results, lab results explained to the patient   Disposition Plan:  Time Spent in minutes   25 minutes  Procedures:    Consultants:   Cardiology  Antimicrobials:      Medications  Scheduled Meds: . hydrALAZINE  5 mg Intravenous Once  . hydrALAZINE  25 mg Oral Q8H  .  HYDROmorphone (DILAUDID) injection  0.5-1 mg Intravenous Q1H   Or  .  HYDROmorphone (DILAUDID) injection  0.5-1 mg Subcutaneous Q1H   Continuous Infusions: . sodium chloride 125 mL/hr at 09/22/16 1125  . dextrose 5 % and 0.45%  NaCl Stopped (09/22/16 1137)   PRN Meds:.   Antibiotics   Anti-infectives    None        Subjective:   Nicolas [name] was seen and examined today. Complaining of chest pain 3/10, knee pain, back pain 6/10, feeling miserable, no fevers or chills. Patient denies dizziness, abdominal pain, N/V/D/C, new weakness, numbess, tingling.   Objective:   Vitals:   09/22/16 1030 09/22/16 1045 09/22/16 1100 09/22/16 1115  BP: 168/97 (!) 173/112 (!) 171/112 (!) 182/107  Pulse: 88 88 87 97  Resp: 12 20 16 21   Temp:      TempSrc:      SpO2: 95% 94% 94% 96%    Intake/Output Summary (Last 24 hours) at 09/22/16 1222 Last data filed at 09/22/16 0933  Gross per 24 hour  Intake              120 ml  Output             1000 ml  Net             -880 ml     Wt Readings from Last 3 Encounters:  05/12/15 112 kg (246 lb 15.7 oz)  01/11/15 108.9 kg (240 lb)  12/29/14 108.9 kg (240 lb)      Exam  General: Alert and oriented x 3, NAD, Uncomfortable  HEENT:  PERRLA, EOMI, Anicteric Sclera, mucous membranes moist.   Neck: Supple, no JVD, no masses  Cardiovascular: S1 S2 auscultated, no rubs, murmurs or gallops. Regular rate and rhythm.  Respiratory: Clear to auscultation bilaterally, no wheezing, rales or rhonchi  Gastrointestinal: Soft, nontender, nondistended, + bowel sounds  Ext: no cyanosis clubbing or edema, knee pain  Neuro: AAOx3, Cr N's II- XII. Strength 5/5 upper and lower extremities bilaterally  Skin: No rashes  Psych: Normal affect and demeanor, alert and oriented x3    Data Reviewed:  I have personally reviewed following labs and imaging studies  Micro Results No results found for this or any previous visit (from the past 240 hour(s)).  Radiology Reports Dg Chest 2 View  Result Date: 09/22/2016 CLINICAL DATA:  Sickle cell pain.  Chest pain. EXAM: CHEST  2 VIEW COMPARISON:  05/10/2015 FINDINGS: Low lung volumes. In combination with habitus this limits assessment. Allowing for this, no evidence of focal airspace disease. Prominent heart size is accentuated by technique. Suspect peribronchial cuffing. No pleural fluid. No pneumothorax. There is degenerative change in the spine. IMPRESSION: Low lung volumes. Peribronchial cuffing can be seen with bronchitis. No focal airspace disease. Electronically Signed   By: Rubye OaksMelanie  Ehinger M.D.   On: 09/22/2016 05:03   Ct Angio Chest Pe W Or Wo Contrast  Result Date: 09/22/2016 CLINICAL DATA:  Moderate back pain beginning last night, associated BILATERAL leg pain, chest pain, history sickle cell disease, diabetes mellitus, hypertension EXAM: CT ANGIOGRAPHY CHEST WITH CONTRAST TECHNIQUE: Multidetector CT imaging of the chest was performed using the standard protocol during bolus administration of intravenous contrast. Multiplanar CT image reconstructions and MIPs were obtained to evaluate the vascular anatomy.  CONTRAST:  100 cc Isovue 370 IV COMPARISON:  None FINDINGS: Cardiovascular: Aorta normal caliber without aneurysm or dissection. Satisfactory opacification of the pulmonary arteries to the segmental level. No evidence of pulmonary embolism. Normal heart size. No pericardial effusion. Mediastinum/Nodes: Normal sized axillary nodes bilaterally. 7 mm short axis RIGHT paratracheal node image 23. No thoracic adenopathy. Esophagus unremarkable. Base of cervical region normal appearance. Lungs/Pleura: Lungs clear.  No pleural effusion or  pneumothorax. 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 Otherwise normal appearance. Musculoskeletal: Scattered endplate spur formation thoracic spine. Sclerosis within the T10 vertebral body with minimal central height loss, nonspecific but could be related to sickle cell disease ; this is been present since at least 08/12/2013. Central concavities of multiple vertebral endplates. Review of the MIP images confirms the above findings. IMPRESSION: No evidence of pulmonary embolism. No acute intrathoracic abnormalities. Electronically Signed   By: Ulyses Southward M.D.   On: 09/22/2016 09:36    Lab Data:  CBC:  Recent Labs Lab 09/22/16 0350  WBC 13.4*  NEUTROABS 9.0*  HGB 11.6*  HCT 32.2*  MCV 83.2  PLT 275   Basic Metabolic Panel:  Recent Labs Lab 09/22/16 0350  NA 144  K 3.1*  CL 107  CO2 24  GLUCOSE 113*  BUN 23*  CREATININE 1.60*  CALCIUM 9.8   GFR: CrCl cannot be calculated (Unknown ideal weight.). Liver Function Tests:  Recent Labs Lab 09/22/16 0350  AST 91*  ALT 158*  ALKPHOS 49  BILITOT 1.2  PROT 7.4  ALBUMIN 4.6   No results for input(s): LIPASE, AMYLASE in the last 168 hours. No results for input(s): AMMONIA in the last 168 hours. Coagulation Profile: No results for input(s): INR, PROTIME in the last 168 hours. Cardiac Enzymes:  Recent Labs Lab 09/22/16 0350 09/22/16 0612 09/22/16 0652  TROPONINI 0.04* 0.08* 0.09*   BNP  (last 3 results) No results for input(s): PROBNP in the last 8760 hours. HbA1C: No results for input(s): HGBA1C in the last 72 hours. CBG: No results for input(s): GLUCAP in the last 168 hours. Lipid Profile: No results for input(s): CHOL, HDL, LDLCALC, TRIG, CHOLHDL, LDLDIRECT in the last 72 hours. Thyroid Function Tests: No results for input(s): TSH, T4TOTAL, FREET4, T3FREE, THYROIDAB in the last 72 hours. Anemia Panel:  Recent Labs  09/22/16 0350  RETICCTPCT 7.4*   Urine analysis:    Component Value Date/Time   COLORURINE YELLOW 09/22/2016 0348   APPEARANCEUR CLEAR 09/22/2016 0348   LABSPEC 1.009 09/22/2016 0348   PHURINE 5.0 09/22/2016 0348   GLUCOSEU NEGATIVE 09/22/2016 0348   HGBUR NEGATIVE 09/22/2016 0348   BILIRUBINUR NEGATIVE 09/22/2016 0348   KETONESUR NEGATIVE 09/22/2016 0348   PROTEINUR 30 (A) 09/22/2016 0348   UROBILINOGEN 1.0 05/11/2015 0219   NITRITE NEGATIVE 09/22/2016 0348   LEUKOCYTESUR NEGATIVE 09/22/2016 0348     Ishita Mcnerney M.D. [facility] 09/22/2016, 12:22 PM  Pager: [phone] Between 7am to 7pm - call Pager - [phone]  After 7pm go to [email or web address] - password [email or web address]  Call night coverage person covering after 7pm

## 2016-09-22 NOTE — ED Notes (Addendum)
Dr. Isidoro Donningai at the bedside, assessing the pt.   Reported to Dr. Isidoro Donningai that pt.s pain level is 8/10 Pt. Is receiving Dilaudid 1 mg IV q 1 hr.

## 2016-09-22 NOTE — ED Notes (Signed)
Reported abnormal labs Troponin and D-dimer to Dr. Isidoro Donningai.  She has requested to keep the pt. Down, hold on bed assignment and she will be down to see the pt.  Ct angio is also being ordered due to elevated d-dimer.

## 2016-09-22 NOTE — Consult Note (Signed)
CARDIOLOGY CONSULT NOTE   Patient ID: Nicolas [name] MRN: [id number] DOB/AGE: 42/11/1975 42 y.o.  Admit Date: 09/22/2016 Referring Physician: TRH: Joen Lauraanford, Christopher MD Primary Physician: Gwynneth AlimentSANDERS,ROBYN N, MD Consulting Cardiologist: Arvilla MeresBensimhon, Takela Varden MD Primary Cardiologist: Verne CarrowMcAlhany, Christopher MD Reason for Consultation: Chest Pain  Clinical Summary Nicolas [name] is a 40 y.o.male with known history of hypertension, OA, diabetes, depression, chronic pain, sickle cell disease, who presented to the emergency room with complaints of severe knee pain, low back pain and chest discomfort. He states he has chronic pain in his knees and low back but the pain in his chest was new and not typical of his chronic pain. Therefore presented to the emergency room. Prior to coming to the emergency room the patient did take a Percocet that it did not relieve his discomfort.  On arrival to the emergency room blood pressure 145/79 heart rate 95 O2 sat 95% he was afebrile. Initial troponin 0.03; followed by 0.08 and 0.09 respectively. EKG normal sinus rhythm, early repol, heart rate of 94 bpm. Chest x-ray with low lung values, bronchitis, no focal airspace disease. CT scan was negative for pulmonary emboli. Potassium low at 3.1. WBC's  13.4, not anemic Platelets 275.   He was treated with hydromorphone 1 mg 5 in the ER over 4 hours. He was also given aspirin 81 mg. He continues to be in excruciating pain. Especially his right knee. He states his chest pain has almost subsided. He described it as a "sticking pain" nonradiating, not associated with shortness of breath diaphoresis or nausea. He remains hypertensive in the ER.  No Known Allergies  Medications Scheduled Medications: .  HYDROmorphone (DILAUDID) injection  0.5-1 mg Intravenous Q1H   Or  .  HYDROmorphone (DILAUDID) injection  0.5-1 mg Subcutaneous Q1H     Infusions: . sodium chloride 125 mL/hr at 09/22/16 1125  . dextrose 5 % and 0.45%  NaCl Stopped (09/22/16 1137)     PRN Medications:     Past Medical History:  Diagnosis Date  . Arthritis    arthritis- back & L hip  . Depression    situational depression, pt. out of work   . Diabetes mellitus (HCC)   . HTN (hypertension) 05/10/2015  . Osteoarthritis of left hip 06/07/2012  . Sickle cell anemia (HCC)     Past Surgical History:  Procedure Laterality Date  . JOINT REPLACEMENT     R hip  . TOTAL HIP ARTHROPLASTY  06/07/2012   Procedure: TOTAL HIP ARTHROPLASTY;  Surgeon: Eulas PostJoshua P Landau, MD;  Location: [facility] OR;  Service: Orthopedics;  Laterality: Left;    Family History  Problem Relation Age of Onset  . Cancer - Other Father   . CAD Neg Hx     Social History Nicolas [name] reports that he has never smoked. He has never used smokeless tobacco. Nicolas [name] reports that he drinks about 0.5 oz of alcohol per week .  Review of Systems Complete review of systems are found to be negative unless outlined in H&P above.  Physical Examination Blood pressure (!) 171/112, pulse 87, temperature 98 F (36.7 C), temperature source Oral, resp. rate 16, SpO2 94 %.  Intake/Output Summary (Last 24 hours) at 09/22/16 1143 Last data filed at 09/22/16 0933  Gross per 24 hour  Intake              120 ml  Output             1000 ml  Net             -  880 ml    Telemetry:Sinus rhythm to sinus tachycardia heart rate 80s to 100.  GEN: Very uncomfortable complaining of knee pain and back pain. HEENT: Conjunctiva and lids normal, oropharynx clear with moist mucosa. Neck: Supple, no elevated JVP or carotid bruits, no thyromegaly. Lungs: Clear to auscultation, nonlabored breathing at rest. Cardiac: Regular rate and rhythm, no S3 or soft TR murmur no pericardial rub. Abdomen: Soft, nontender, no hepatomegaly, bowel sounds present, no guarding or rebound. Extremities: No pitting edema, distal pulses 2+. Pain in right knee, unable to move or do range of motion due to significant  pain. Skin: Warm and dry. Musculoskeletal: No kyphosis. Neuropsychiatric: Alert and oriented x3, affect grossly appropriate.  Prior Cardiac Testing/Procedures 1.Echocardiogram 11/11/2013.  Left ventricle: The cavity size was normal. Wall thickness was increased in a pattern of mild LVH. Systolic function was normal. The estimated ejection fraction was in the range of 60% to 65%. Wall motion was normal; there were no regional wall motion abnormalities. Doppler parameters are consistent with abnormal left ventricular relaxation (grade 1 diastolic dysfunction). - Aortic valve: There was no stenosis. - Mitral valve: Mildly calcified annulus. No significant regurgitation. - Right ventricle: The cavity size was normal. - Pulmonary arteries: No complete TR doppler jet so unable to estimate PA systolic pressure. - Inferior vena cava: The vessel was normal in size; the respirophasic diameter changes were in the normal range (= 50%); findings are consistent with normal central venous pressure.  Lab Results  Basic Metabolic Panel:  Recent Labs Lab 09/22/16 0350  NA 144  K 3.1*  CL 107  CO2 24  GLUCOSE 113*  BUN 23*  CREATININE 1.60*  CALCIUM 9.8    Liver Function Tests:  Recent Labs Lab 09/22/16 0350  AST 91*  ALT 158*  ALKPHOS 49  BILITOT 1.2  PROT 7.4  ALBUMIN 4.6    CBC:  Recent Labs Lab 09/22/16 0350  WBC 13.4*  NEUTROABS 9.0*  HGB 11.6*  HCT 32.2*  MCV 83.2  PLT 275    Cardiac Enzymes:  Recent Labs Lab 09/22/16 0350 09/22/16 0612 09/22/16 0652  TROPONINI 0.04* 0.08* 0.09*    Radiology: Dg Chest 2 View  Result Date: 09/22/2016 CLINICAL DATA:  Sickle cell pain.  Chest pain. EXAM: CHEST  2 VIEW COMPARISON:  05/10/2015 FINDINGS: Low lung volumes. In combination with habitus this limits assessment. Allowing for this, no evidence of focal airspace disease. Prominent heart size is accentuated by technique. Suspect  peribronchial cuffing. No pleural fluid. No pneumothorax. There is degenerative change in the spine. IMPRESSION: Low lung volumes. Peribronchial cuffing can be seen with bronchitis. No focal airspace disease. Electronically Signed   By: Rubye OaksMelanie  Ehinger M.D.   On: 09/22/2016 05:03   Ct Angio Chest Pe W Or Wo Contrast  Result Date: 09/22/2016 CLINICAL DATA:  Moderate back pain beginning last night, associated BILATERAL leg pain, chest pain, history sickle cell disease, diabetes mellitus, hypertension EXAM: CT ANGIOGRAPHY CHEST WITH CONTRAST TECHNIQUE: Multidetector CT imaging of the chest was performed using the standard protocol during bolus administration of intravenous contrast. Multiplanar CT image reconstructions and MIPs were obtained to evaluate the vascular anatomy. CONTRAST:  100 cc Isovue 370 IV COMPARISON:  None FINDINGS: Cardiovascular: Aorta normal caliber without aneurysm or dissection. Satisfactory opacification of the pulmonary arteries to the segmental level. No evidence of pulmonary embolism. Normal heart size. No pericardial effusion. Mediastinum/Nodes: Normal sized axillary nodes bilaterally. 7 mm short axis RIGHT paratracheal node image 23. No thoracic adenopathy. Esophagus  unremarkable. Base of cervical region normal appearance. Lungs/Pleura: Lungs clear.  No pleural effusion or pneumothorax. 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 Otherwise normal appearance. Musculoskeletal: Scattered endplate spur formation thoracic spine. Sclerosis within the T10 vertebral body with minimal central height loss, nonspecific but could be related to sickle cell disease ; this is been present since at least 08/12/2013. Central concavities of multiple vertebral endplates. Review of the MIP images confirms the above findings. IMPRESSION: No evidence of pulmonary embolism. No acute intrathoracic abnormalities. Electronically Signed   By: Ulyses Southward M.D.   On: 09/22/2016 09:36     ECG: Sinus rhythm,  early, no acute ST T-wave abnormalities   Impression and Recommendations 1, Chest pain: Described as sticking sharp pain nonradiating and not associated with shortness of breath dizziness or nausea. Mildly elevated troponin, most recent 0.09. No acute ST-T wave abnormalities. He has been seen in the past by cardiology and had a stress test which she states was normal although I do not have records of same on review of EPIC records, or procedures.  Continue to cycle troponin. Repeat EKG in am. Consider stress test once his generalized pain is resolved.   2. Hypertension, severe and uncontrolled in setting of pain: Likely related to his back and knee pain. He denies medical noncompliance. Review of home medications has him on carvedilol 25 mg twice a day lisinopril 5 mg daily, and triamterene HCTZ 37.5/25 mg daily. This is been reordered. We'll give one dose of IV hydralazine for blood pressure control. Not well controlled.   3. Severe musculoskeletal pain: States it is related to sickle cell. Most significant pain in right knee and lower back. May need to consider x-ray of need to evaluate further. We'll defer to primary care.  4. Sickle Cell Disease with acute crisis: Per [facility]  5. Hypokalemia: Will give one dose of potassium 40 mEq. Check magnesium.   Signed: Bettey Mare. Lawrence NP [facility]  09/22/2016, 11:43 AM Co-Sign MD  Patient seen and examined with Joni Reining, NP. We discussed all aspects of the encounter. I agree with the assessment and plan as stated above.   40 y/o male with several CRFs. Reportedly had negative stress test 2 years ago. Very active at baseline. Goes to gym without ischemic symptoms. Presents to ER with severe sickle cell crisis. Complaining of chest and joint pain. Very uncomfortable. SBP > 200. ECG with LVH and repol. Trop 0.08 -> 0.09. CT chest reviewed with radiology. No PE. Coronary arteries well opacified on study and no coronary calcifications or obvious  stenosis.  I do not think his CP is related to myocardial ischemia. More likely related to his sickle cell crisis. No clear acute chest crisis. Chest wall tender on exam. Would treat sickle cell and HTN. Check echo. If echo normal can consider repeat stress test as outpatient.   We will sign off. Please call with questions.   Willadeen Colantuono,MD 1:07 PM

## 2016-09-22 NOTE — ED Notes (Signed)
Attempted to page X2 to Dr. Isidoro Donningai.  Waiting for return call

## 2016-09-22 NOTE — ED Notes (Signed)
Pt. Just returned from Ct scan.  Using the urinal

## 2016-09-22 NOTE — ED Provider Notes (Signed)
[facility] DEPT Provider Note   CSN: [id number] Arrival date & time: 09/22/16  8119  By signing my name below, I, Hansel Feinstein, attest that this documentation has been prepared under the direction and in the presence of Quintella Reichert, MD. Electronically Signed: Hansel Feinstein, ED Scribe. 09/22/16. 3:50 AM.    History   Chief Complaint Chief Complaint  Patient presents with  . Sickle Cell Pain Crisis    HPI Nicolas [name] is a 40 y.o. male with h/o sickle cell disease type [place] who presents to the Emergency Department complaining of moderate back pain onset last night with associated bilateral leg pain, CP, mild SOB. Pt states his symptoms are similar to prior sickle cell flare-ups; however, he does not normally get CP. He states he takes 5-325 mg Percocet at home for pain control, but it has not provided relief of his current pain. Pt is followed by hematology with [facility]. He denies fever, abdominal pain, nausea, vomiting, diarrhea, numbness or weakness in BLE.   The history is provided by the patient. No language interpreter was used.    Past Medical History:  Diagnosis Date  . Arthritis    arthritis- back & L hip  . Depression    situational depression, pt. out of work   . Diabetes mellitus ([place])   . HTN (hypertension) 05/10/2015  . Osteoarthritis of left hip 06/07/2012  . Sickle cell anemia [facility])     Patient Active Problem List   Diagnosis Date Noted  . Sickle cell disease ([place]) 09/22/2016  . Sickle cell pain crisis ([place]) 09/22/2016  . Elevated troponin 09/22/2016  . AKI (acute kidney injury) ([place]) 09/22/2016  . Sickle cell crisis ([place]) 09/22/2016  . Anemia 05/11/2015  . Plasmodium falciparum malaria   . 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 Type 2 diabetes mellitus without complication, without long-term current use of insulin ([place]) 05/10/2015  . Hypokalemia 05/10/2015  . Dehydration 05/10/2015  . Leukocytosis 05/10/2015  . Essential  hypertension 05/10/2015  . Elevated LFTs 05/10/2015  . Chest pain 11/03/2013  . DKA, type 2 ([place]) 08/13/2013  . Postoperative anemia due to acute blood loss 06/08/2012  . Osteoarthritis of left hip 06/07/2012    Past Surgical History:  Procedure Laterality Date  . JOINT REPLACEMENT     R hip  . TOTAL HIP ARTHROPLASTY  06/07/2012   Procedure: TOTAL HIP ARTHROPLASTY;  Surgeon: Johnny Bridge, MD;  Location: [facility];  Service: Orthopedics;  Laterality: Left;       Home Medications    Prior to Admission medications   Medication Sig Start Date End Date Taking? Authorizing Provider  amLODipine (NORVASC) 10 MG tablet Take 10 mg by mouth daily. 11/25/14  Yes Historical Provider, MD  carvedilol (COREG) 25 MG tablet Take 25 mg by mouth 2 (two) times daily. 01/07/16  Yes Historical Provider, MD  Cholecalciferol (VITAMIN D3) 2000 units capsule Take 2,000 Units by mouth daily. 02/05/16  Yes Historical Provider, MD  folic acid (FOLVITE) 1 MG tablet Take 1 mg by mouth daily. 12/23/14  Yes Historical Provider, MD  glucose blood test strip Use as instructed Patient taking differently: 1 each by Other route every other day. Use as instructed 08/15/13  Yes Adeline C Viyuoh, MD  glucose monitoring kit (FREESTYLE) monitoring kit Glucometer- ACCUCHECK BRAND 08/15/13  Yes Adeline C Viyuoh, MD  JANUVIA 100 MG tablet Take 100 mg by mouth daily. 02/08/15  Yes Historical Provider, MD  Lancets (ACCU-CHEK MULTICLIX) lancets Use as  instructed Patient taking differently: 1 each by Other route every other day.  08/15/13  Yes Adeline C Viyuoh, MD  lisinopril (PRINIVIL,ZESTRIL) 5 MG tablet Take 5 mg by mouth daily. 04/17/15  Yes Historical Provider, MD  meloxicam (MOBIC) 15 MG tablet Take 15 mg by mouth daily as needed for pain.  01/29/16  Yes Historical Provider, MD  metFORMIN (GLUCOPHAGE) 500 MG tablet Take 500 mg by mouth 2 (two) times daily with a meal.   Yes Historical Provider, MD  orphenadrine (NORFLEX) 100 MG tablet  Take 1 tablet (100 mg total) by mouth 2 (two) times daily. 01/11/15  Yes Charlesetta Shanks, MD  oxyCODONE-acetaminophen (PERCOCET) 7.5-325 MG tablet Take 1 tablet by mouth every 4 (four) hours as needed for severe pain.   Yes Historical Provider, MD  triamterene-hydrochlorothiazide (MAXZIDE-25) 37.5-25 MG per tablet Take 1 tablet by mouth daily. 12/30/14  Yes Historical Provider, MD    Family History Family History  Problem Relation Age of Onset  . Cancer - Other Father   . CAD Neg Hx     Social History Social History  Substance Use Topics  . Smoking status: Never Smoker  . Smokeless tobacco: Never Used  . Alcohol use 0.5 oz/week    1 Standard drinks or equivalent per week     Allergies   Patient has no known allergies.   Review of Systems Review of Systems  Constitutional: Negative for fever.  Respiratory: Positive for shortness of breath.   Cardiovascular: Positive for chest pain.  Gastrointestinal: Negative for abdominal pain, diarrhea, nausea and vomiting.  Musculoskeletal: Positive for back pain and myalgias.  Neurological: Negative for weakness and numbness.  All other systems reviewed and are negative.    Physical Exam Updated Vital Signs BP (!) 156/103   Pulse 90   Temp 98 F (36.7 C) (Oral)   Resp 16   SpO2 97%   Physical Exam  Constitutional: He is oriented to person, place, and time. He appears well-developed and well-nourished.  Uncomfortable appearing  HENT:  Head: Normocephalic and atraumatic.  Cardiovascular: Normal rate, regular rhythm and normal heart sounds.  Exam reveals no gallop and no friction rub.   No murmur heard. Pulmonary/Chest: Effort normal and breath sounds normal. No respiratory distress. He has no wheezes. He has no rales. He exhibits no tenderness.  Abdominal: Soft. There is no tenderness. There is no rebound and no guarding.  Musculoskeletal: He exhibits no edema or tenderness.  Neurological: He is alert and oriented to person,  place, and time.  Skin: Skin is warm and dry.  Psychiatric: He has a normal mood and affect. His behavior is normal.  Nursing note and vitals reviewed.    ED Treatments / Results   DIAGNOSTIC STUDIES: Oxygen Saturation is 97% on RA, normal by my interpretation.    COORDINATION OF CARE: 3:47 AM Discussed treatment plan with pt at bedside which includes labs, pain control and pt agreed to plan.    Labs (all labs ordered are listed, but only abnormal results are displayed) Labs Reviewed  CBC WITH DIFFERENTIAL/PLATELET - Abnormal; Notable for the following:       Result Value   WBC 13.4 (*)    RBC 3.87 (*)    Hemoglobin 11.6 (*)    HCT 32.2 (*)    RDW 16.8 (*)    Neutro Abs 9.0 (*)    Monocytes Absolute 1.3 (*)    All other components within normal limits  RETICULOCYTES - Abnormal; Notable for the following:  Retic Ct Pct 7.4 (*)    RBC. 3.87 (*)    Retic Count, Manual 286.4 (*)    All other components within normal limits  COMPREHENSIVE METABOLIC PANEL - Abnormal; Notable for the following:    Potassium 3.1 (*)    Glucose, Bld 113 (*)    BUN 23 (*)    Creatinine, Ser 1.60 (*)    AST 91 (*)    ALT 158 (*)    GFR calc non Af Amer 52 (*)    GFR calc Af Amer 60 (*)    All other components within normal limits  URINALYSIS, ROUTINE W REFLEX MICROSCOPIC - Abnormal; Notable for the following:    Protein, ur 30 (*)    All other components within normal limits  TROPONIN I - Abnormal; Notable for the following:    Troponin I 0.04 (*)    All other components within normal limits  TROPONIN I - Abnormal; Notable for the following:    Troponin I 0.08 (*)    All other components within normal limits  D-DIMER, QUANTITATIVE (NOT AT [facility]) - Abnormal; Notable for the following:    D-Dimer, Quant 1.14 (*)    All other components within normal limits  TROPONIN I  TROPONIN I    EKG  EKG Interpretation  Date/Time:  Monday September 22 2016 03:19:46 EST Ventricular Rate:  90 PR  Interval:    QRS Duration: 91 QT Interval:  346 QTC Calculation: 424 R Axis:   38 Text Interpretation:  Sinus rhythm Consider left ventricular hypertrophy ST elev, probable normal early repol pattern Baseline wander in lead(s) V1 Confirmed by Hazle Coca [phone]) on 09/22/2016 3:25:34 AM       Radiology Dg Chest 2 View  Result Date: 09/22/2016 CLINICAL DATA:  Sickle cell pain.  Chest pain. EXAM: CHEST  2 VIEW COMPARISON:  05/10/2015 FINDINGS: Low lung volumes. In combination with habitus this limits assessment. Allowing for this, no evidence of focal airspace disease. Prominent heart size is accentuated by technique. Suspect peribronchial cuffing. No pleural fluid. No pneumothorax. There is degenerative change in the spine. IMPRESSION: Low lung volumes. Peribronchial cuffing can be seen with bronchitis. No focal airspace disease. Electronically Signed   By: Jeb Levering M.D.   On: 09/22/2016 05:03    Procedures Procedures (including critical care time)  Medications Ordered in ED Medications  dextrose 5 %-0.45 % sodium chloride infusion ( Intravenous New Bag/Given 09/22/16 0410)  HYDROmorphone (DILAUDID) injection 0.5-1 mg ( Intravenous Canceled Entry 09/22/16 0701)    Or  HYDROmorphone (DILAUDID) injection 0.5-1 mg ( Subcutaneous See Alternative 09/22/16 0701)  HYDROmorphone (DILAUDID) injection 0.5-1 mg (not administered)    Or  HYDROmorphone (DILAUDID) injection 0.5-1 mg (not administered)  aspirin chewable tablet 324 mg (not administered)  HYDROmorphone (DILAUDID) injection 0.5-1 mg (1 mg Intravenous Given 09/22/16 0409)    Or  HYDROmorphone (DILAUDID) injection 0.5-1 mg ( Subcutaneous See Alternative 09/22/16 0409)  HYDROmorphone (DILAUDID) injection 0.5-1 mg (1 mg Intravenous Given 09/22/16 0452)    Or  HYDROmorphone (DILAUDID) injection 0.5-1 mg ( Subcutaneous See Alternative 09/22/16 0452)     Initial Impression / Assessment and Plan / ED Course  I have reviewed the  triage vital signs and the nursing notes.  Pertinent labs & imaging results that were available during my care of the patient were reviewed by me and considered in my medical decision making (see chart for details).  Clinical Course     Patient with history of sickle cell  disease here with chest pain, back pain. He states pain is basically like his typical pain crisis but pain is currently more intense than baseline. He is uncomfortable on examination. EKG with ST elevation that is similar to priors and consistent with early repolarization. Troponin is minimally elevated, this is of unclear significance. Hemoglobin is near his baseline. BMP does demonstrate some acute kidney injury compared to priors. He has ongoing pain in the emergency department. Plan to admit to the hospital service for further treatment.  Final Clinical Impressions(s) / ED Diagnoses   Final diagnoses:  None    New Prescriptions New Prescriptions   No medications on file    I personally performed the services described in this documentation, which was scribed in my presence. The recorded information has been reviewed and is accurate.    Quintella Reichert, MD 09/22/16 [phone]

## 2016-09-22 NOTE — H&P (Signed)
History and Physical  Patient Name: Nicolas [name]     OVF:[id number]    DOB: [birth year]    DOA: 09/22/2016 PCP: [name], MD   Patient coming from: Home  Chief Complaint: Pain crisis  HPI: Van Seymore is a 40 y.o. male with a past medical history significant for sickle cell disease and NIDDM and HTN who presents with complaints of sickle cell pain.  The patient was in his usual state of health until several hours before arrival when he had severe onset of knee pain, low back pain, and chest pain. The chest pain is aching, constant, severe, and not typical of his sickle cell.  He has no fever, sputum.  Mild dyspnea to EDP, denies to me.  The knee pain and low back pain are typical of his sickle cell pain, however this did not respond to his home Percocet so he came to the emergency room.  ED course: -Afebrile, heart rate 90, respirations and pulse ox normal, blood pressure 116/72 -Na 144, K 3.1, Cr 1.6 (baseline 1.0-1.3), WBC 13.4K, Hgb 11.6 -Troponin 0.04 -Chest x-ray was poor quality -ECG showed early repolarization pattern, sinus tachycardia, no ST changes -She was given hydromorphone IV 3 rounds without improvement in his pain and TRH were asked to sickle cell pain crisis   No previous ischemic workup.  Has DM and HTN, does not smoke.  No family history of CAD that he knows about.    ROS: Review of Systems  Constitutional: Negative for chills and fever.  Respiratory: Positive for shortness of breath. Negative for cough and sputum production.   Cardiovascular: Positive for chest pain. Negative for leg swelling.  Musculoskeletal: Positive for back pain and joint pain (knees).  All other systems reviewed and are negative.         Past Medical History:  Diagnosis Date  . Arthritis    arthritis- back & L hip  . Depression    situational depression, pt. out of work   . Diabetes mellitus ([place])   . HTN (hypertension) 05/10/2015  . Osteoarthritis of left hip 06/07/2012  .  Sickle cell anemia ([facility])     Past Surgical History:  Procedure Laterality Date  . JOINT REPLACEMENT     R hip  . TOTAL HIP ARTHROPLASTY  06/07/2012   Procedure: TOTAL HIP ARTHROPLASTY;  Surgeon: Johnny Bridge, MD;  Location: [facility];  Service: Orthopedics;  Laterality: Left;    Social History: Patient lives with his wife and children.  The patient walks unassisted.  He is disabled.  He does not smoke.  He is from [place].    No Known Allergies  Family history: family history includes Cancer - Other in his father.  No family history of CAD.  Prior to Admission medications   Medication Sig Start Date End Date Taking? Authorizing Provider  amLODipine (NORVASC) 10 MG tablet Take 10 mg by mouth daily. 11/25/14  Yes Historical Provider, MD  carvedilol (COREG) 25 MG tablet Take 25 mg by mouth 2 (two) times daily. 01/07/16  Yes Historical Provider, MD  Cholecalciferol (VITAMIN D3) 2000 units capsule Take 2,000 Units by mouth daily. 02/05/16  Yes Historical Provider, MD  folic acid (FOLVITE) 1 MG tablet Take 1 mg by mouth daily. 12/23/14  Yes Historical Provider, MD  glucose blood test strip Use as instructed Patient taking differently: 1 each by Other route every other day. Use as instructed 08/15/13  Yes Adeline C Viyuoh, MD  glucose monitoring kit (FREESTYLE) monitoring kit Glucometer-  ACCUCHECK BRAND 08/15/13  Yes Adeline C Viyuoh, MD  JANUVIA 100 MG tablet Take 100 mg by mouth daily. 02/08/15  Yes Historical Provider, MD  Lancets (ACCU-CHEK MULTICLIX) lancets Use as instructed Patient taking differently: 1 each by Other route every other day.  08/15/13  Yes Adeline C Viyuoh, MD  lisinopril (PRINIVIL,ZESTRIL) 5 MG tablet Take 5 mg by mouth daily. 04/17/15  Yes Historical Provider, MD  meloxicam (MOBIC) 15 MG tablet Take 15 mg by mouth daily as needed for pain.  01/29/16  Yes Historical Provider, MD  metFORMIN (GLUCOPHAGE) 500 MG tablet Take 500 mg by mouth 2 (two) times daily with a meal.   Yes  Historical Provider, MD  orphenadrine (NORFLEX) 100 MG tablet Take 1 tablet (100 mg total) by mouth 2 (two) times daily. 01/11/15  Yes Charlesetta Shanks, MD  oxyCODONE-acetaminophen (PERCOCET) 7.5-325 MG tablet Take 1 tablet by mouth every 4 (four) hours as needed for severe pain.   Yes Historical Provider, MD  triamterene-hydrochlorothiazide (MAXZIDE-25) 37.5-25 MG per tablet Take 1 tablet by mouth daily. 12/30/14  Yes Historical Provider, MD       Physical Exam: BP 143/91   Pulse 85   Temp 98 F (36.7 C) (Oral)   Resp 13   SpO2 95%  General appearance: Well-developed, adult male, alert and in moderate distress from pain.   Eyes: Ancteric, conjunctiva pink, lids and lashes normal. PERRL.    ENT: No nasal deformity, discharge, epistaxis.  Hearing normal. OP moist without lesions.   Neck: No neck masses.  Trachea midline.  No thyromegaly/tenderness. Lymph: No cervical or supraclavicular lymphadenopathy. Skin: Warm and dry.  No jaundice.  No suspicious rashes or lesions. Cardiac: RRR, nl S1-S2, SEM vs S4.  Capillary refill is brisk.  No LE edema.  Radial and DP pulses 2+ and symmetric. Respiratory: Normal respiratory rate and rhythm.  CTAB without rales or wheezes. Abdomen: Abdomen soft.  No TTP. No ascites, distension, hepatosplenomegaly.   MSK: No deformities or effusions.  No cyanosis or clubbing. Neuro: Cranial nerves grossly normal.  Sensation intact to light touch. Speech is fluent.  Muscle strength normal.    Psych: Sensorium intact and responding to questions, attention normal.  Behavior appropriate.  Affect blunted by pain.  Judgment and insight appear normal.     Labs on Admission:  I have personally reviewed following labs and imaging studies: CBC:  Recent Labs Lab 09/22/16 0350  WBC 13.4*  NEUTROABS 9.0*  HGB 11.6*  HCT 32.2*  MCV 83.2  PLT 500   Basic Metabolic Panel:  Recent Labs Lab 09/22/16 0350  NA 144  K 3.1*  CL 107  CO2 24  GLUCOSE 113*  BUN 23*    CREATININE 1.60*  CALCIUM 9.8   GFR: CrCl cannot be calculated (Unknown ideal weight.).  Liver Function Tests:  Recent Labs Lab 09/22/16 0350  AST 91*  ALT 158*  ALKPHOS 49  BILITOT 1.2  PROT 7.4  ALBUMIN 4.6   No results for input(s): LIPASE, AMYLASE in the last 168 hours. No results for input(s): AMMONIA in the last 168 hours. Coagulation Profile: No results for input(s): INR, PROTIME in the last 168 hours. Cardiac Enzymes:  Recent Labs Lab 09/22/16 0350  TROPONINI 0.04*   BNP (last 3 results) No results for input(s): PROBNP in the last 8760 hours. HbA1C: No results for input(s): HGBA1C in the last 72 hours. CBG: No results for input(s): GLUCAP in the last 168 hours. Lipid Profile: No results for input(s): CHOL,  HDL, LDLCALC, TRIG, CHOLHDL, LDLDIRECT in the last 72 hours. Thyroid Function Tests: No results for input(s): TSH, T4TOTAL, FREET4, T3FREE, THYROIDAB in the last 72 hours. Anemia Panel:  Recent Labs  09/22/16 0350  RETICCTPCT 7.4*   Sepsis Labs:  Invalid input(s): PROCALCITONIN, LACTICIDVEN No results found for this or any previous visit (from the past 240 hour(s)).       Radiological Exams on Admission: Personally reviewed: Dg Chest 2 View  Result Date: 09/22/2016 CLINICAL DATA:  Sickle cell pain.  Chest pain. EXAM: CHEST  2 VIEW COMPARISON:  05/10/2015 FINDINGS: Low lung volumes. In combination with habitus this limits assessment. Allowing for this, no evidence of focal airspace disease. Prominent heart size is accentuated by technique. Suspect peribronchial cuffing. No pleural fluid. No pneumothorax. There is degenerative change in the spine. IMPRESSION: Low lung volumes. Peribronchial cuffing can be seen with bronchitis. No focal airspace disease. Electronically Signed   By: Jeb Levering M.D.   On: 09/22/2016 05:03    EKG: Independently reviewed. Rate 90. QTc 424.  No ST changes, early repol pattern.    Assessment/Plan    1.  Vaso-occlussive sickle cell pain crisis: Will admit per sickle cell protocol for pain control.  No evidence of acute chest at this time. -Weight based normal-dose IV hydromorphone PCA with continuous ETCO2 monitoring -Hold home Percocet (only takes 2-3 per day) -IV Ketorolac contraindicated with troponin leak and AKI -Hydrate with IVF .45% saline @ 100 cc/hr  -Diphenhydramine, ondansetron and bowel regimen    2. Sickle cell anemia:  Baseline Hgb 11 -Transfuse as needed if Hg drops significantly below baseline.  3. Acute kidney injury:  -Check urine electrolytes -Fluids and trend BMP  4. Elevated troponin:  Perhaps this is just poor clearnace with AKI.  Doubt ACS.  Mild SOB and tachycardia. -Trend troponin -Check D-dimer -Repeat CXR, 2V given poor quality initial image  5. Elevated LFTs:  Have been elevated in past.  Hep serologies negative in 2016. No previous liver imaging.  No abdominal pain at all. -Trend LFT  6. NIDDM:  -Hold home metformin and Januvia -SSI with meals  7. Hypertension:  -Continue home amlodipine, HCTZ, triamterene, carvedilol -Hold lisinopril given AKI     DVT prophylaxis: Lovenox  Code Status: FULL  Family Communication: None present  Disposition Plan: Initiate IV hydromorphone PCA now, trend troponin and follow d-dimer. Consults called: None Admission status: INPATIENT, stepdown given CP rule out and ongoing chest pain         Medical decision making: Patient seen at 6:22 AM on 09/22/2016.  The patient was discussed with Dr. Ralene Bathe.  What exists of the patient's chart was reviewed in depth and summarized above.  Clinical condition: currently hemodynamically stable with continuous respiratory monitoring.        Edwin Dada [facility] Pager [phone]

## 2016-09-22 NOTE — ED Triage Notes (Signed)
Pt complaining of sickle cell crisis. Pt complaining of back pain, chest pain, SOB and bilateral leg pain. [facility]

## 2016-09-23 ENCOUNTER — Encounter (HOSPITAL_COMMUNITY): Payer: Self-pay | Admitting: General Practice

## 2016-09-23 ENCOUNTER — Inpatient Hospital Stay (HOSPITAL_COMMUNITY): Payer: Commercial Managed Care - HMO

## 2016-09-23 DIAGNOSIS — D5700 Hb-SS disease with crisis, unspecified: Secondary | ICD-10-CM

## 2016-09-23 DIAGNOSIS — I10 Essential (primary) hypertension: Secondary | ICD-10-CM

## 2016-09-23 LAB — BASIC METABOLIC PANEL
Anion gap: 12 (ref 5–15)
BUN: 21 mg/dL — ABNORMAL HIGH (ref 6–20)
CO2: 25 mmol/L (ref 22–32)
Calcium: 9.5 mg/dL (ref 8.9–10.3)
Chloride: 99 mmol/L — ABNORMAL LOW (ref 101–111)
Creatinine, Ser: 1.19 mg/dL (ref 0.61–1.24)
GFR calc Af Amer: >60 mL/min (ref >60)
GFR calc non Af Amer: >60 mL/min (ref >60)
Glucose, Bld: 136 mg/dL — ABNORMAL HIGH (ref 65–99)
Potassium: 3.5 mmol/L (ref 3.5–5.1)
Sodium: 136 mmol/L (ref 135–145)

## 2016-09-23 LAB — GLUCOSE, CAPILLARY
Glucose-Capillary: 143 mg/dL — ABNORMAL HIGH (ref 65–99)
Glucose-Capillary: 133 mg/dL — ABNORMAL HIGH (ref 65–99)
Glucose-Capillary: 148 mg/dL — ABNORMAL HIGH (ref 65–99)
Glucose-Capillary: 154 mg/dL — ABNORMAL HIGH (ref 65–99)

## 2016-09-23 LAB — MRSA PCR SCREENING: MRSA by PCR: NEGATIVE

## 2016-09-23 LAB — CBC
HCT: 31.1 % — ABNORMAL LOW (ref 39.0–52.0)
Hemoglobin: 11.2 g/dL — ABNORMAL LOW (ref 13.0–17.0)
MCH: 29.7 pg (ref 26.0–34.0)
MCHC: 36 g/dL (ref 30.0–36.0)
MCV: 82.5 fL (ref 78.0–100.0)
Platelets: 123 10*3/uL — ABNORMAL LOW (ref 150–400)
RBC: 3.77 MIL/uL — ABNORMAL LOW (ref 4.22–5.81)
RDW: 16.8 % — ABNORMAL HIGH (ref 11.5–15.5)
WBC: 15.5 10*3/uL — ABNORMAL HIGH (ref 4.0–10.5)

## 2016-09-23 LAB — ECHOCARDIOGRAM COMPLETE: Weight: 3505.6 oz

## 2016-09-23 LAB — SODIUM, URINE, RANDOM: Sodium, Ur: 68 mmol/L

## 2016-09-23 LAB — CREATININE, URINE, RANDOM: Creatinine, Urine: 79.68 mg/dL

## 2016-09-23 MED ORDER — LISINOPRIL 5 MG PO TABS
5.0000 mg | ORAL_TABLET | Freq: Every day | ORAL | Status: DC
  Administered 2016-09-23 – 2016-09-24 (×2): 5 mg via ORAL
  Filled 2016-09-23 (×2): qty 1

## 2016-09-23 MED ORDER — ACETAMINOPHEN 325 MG PO TABS
650.0000 mg | ORAL_TABLET | ORAL | Status: DC | PRN
  Administered 2016-09-23 – 2016-09-29 (×12): 650 mg via ORAL
  Filled 2016-09-23 (×11): qty 2

## 2016-09-23 MED ORDER — HYDRALAZINE HCL 20 MG/ML IJ SOLN: 10.0000 mg | Freq: Four times a day (QID) | INTRAMUSCULAR | Status: DC | PRN

## 2016-09-23 MED ORDER — SODIUM CHLORIDE 0.9 % IV BOLUS (SEPSIS)
500.0000 mL | Freq: Once | INTRAVENOUS | Status: AC
  Administered 2016-09-23: 500 mL via INTRAVENOUS

## 2016-09-23 NOTE — Progress Notes (Signed)
[facility]                                                                              Patient Demographics  Nicolas [name], is a 40 y.o. male, DOB - 29-Jul-1976, ONG:[id number]  Admit date - 09/22/2016   Admitting Physician Haniya Fern Jenna Luo, MD  Outpatient Primary MD for the patient is [name], MD  Outpatient specialists:   LOS - 1  days    Chief Complaint  Patient presents with  . Sickle Cell Pain Crisis       Brief summary  Patient is a 40 y.o. male with  sickle cell disease and NIDDM and HTN presented with severe knee pain, low back pain and chest pain.  The patient was in his usual state of health until several hours before arrival when he had severe onset of knee pain, low back pain, and chest pain. The chest pain is aching, constant, severe, and not typical of his sickle cell. He has no fever, sputum. The knee pain and low back pain are typical of his sickle cell pain, however this did not respond to his home Percocet so he came to the emergency room.  Assessment & Plan      Vaso-occlussive sickle cell pain crisis: -Pain currently stable  - Continue per sickle cell protocol for pain control.  No evidence of acute chest at this time. - Placed on IV hydromorphone PCA  -Hold home Percocet (only takes 2-3 per day), IV Toradol contraindicated with troponin leak and AKI -Continue IV fluid hydration  -Diphenhydramine, ondansetron and bowel regimen   Elevated troponin:  - Troponins were trending up hence cardiology was consulted.  Per cardiology, chest pain is not likely related to myocardial ischemia and likely due to sickle cell crisis. Recommended treating sickle cell crisis and obtain 2-D echo. If echo is normal can consider repeat stress test as outpatient. Cardiology signed off. - D-dimer elevated, as CT angiogram of the chest obtained, negative for pulmonary embolism  -Follow 2-D echo   Sickle cell anemia:  - Baseline Hgb 11Currently at  baseline -Transfuse as needed if Hg drops significantly below baseline.   Acute kidney injury:  - Likely due to #1, dehydration, hold HCTZ - placed on IV fluid hydrationcreatinine improving, restart lisinopril   Elevated LFTs:  Have been elevated in past.  Hep serologies negative in 2016. No previous liver imaging.  No abdominal pain at all. -Trend LFT  NIDDM:  -Hold home metformin and Januvia -Continue sliding scale insulin  Hypertension:  -Continue amlodipine, carvedilol - restart lisinopril  - Continue to hold HCTZ, triamterene   Code Status: Full CODE STATUS DVT Prophylaxis:  Lovenox Family Communication: Discussed in detail with the patient, all imaging results, lab results explained to the patient   Disposition Plan:  Time Spent in minutes   25 minutes  Procedures:    Consultants:   Cardiology  Antimicrobials:      Medications  Scheduled Meds: . amLODipine  10 mg Oral Daily  . carvedilol  25 mg Oral BID  . enoxaparin (LOVENOX) injection  40 mg Subcutaneous Q24H  . hydrALAZINE  25 mg  Oral Q8H  . HYDROmorphone   Intravenous Q4H  . insulin aspart  0-15 Units Subcutaneous TID WC  . insulin aspart  0-5 Units Subcutaneous QHS  . senna-docusate  1 tablet Oral BID   Continuous Infusions: . sodium chloride 125 mL/hr at 09/22/16 2200   PRN Meds:.   Antibiotics   Anti-infectives    None        Subjective:   [name] was seen and examined today. Feeling somewhat better today, chest pain improved. Knee pain and back pain improving  no fevers. Patient denies dizziness, abdominal pain, N/V/D/C, new weakness, numbess, tingling.   Objective:   Vitals:   09/23/16 0325 09/23/16 0400 09/23/16 0614 09/23/16 0838  BP: (!) 176/103  (!) 153/97   Pulse: 93     Resp: 18 17  20   Temp: 99.3 F (37.4 C)     TempSrc: Oral     SpO2: 95% 97%  94%  Weight: 99.4 kg (219 lb 1.6 oz)       Intake/Output Summary (Last 24 hours) at 09/23/16 1102 Last  data filed at 09/23/16 0759  Gross per 24 hour  Intake          3042.92 ml  Output             2975 ml  Net            67.92 ml     Wt Readings from Last 3 Encounters:  09/23/16 99.4 kg (219 lb 1.6 oz)  05/12/15 112 kg (246 lb 15.7 oz)  01/11/15 108.9 kg (240 lb)     Exam  General: Alert and oriented x 3, NAD  HEENT:    Neck:   Cardiovascular: S1 S2 auscultated, no rubs, murmurs or gallops. Regular rate and rhythm.  Respiratory: Clear to auscultation bilaterally, no wheezing, rales or rhonchi  Gastrointestinal: Soft, nontender, nondistended, + bowel sounds  Ext: no cyanosis clubbing or edema, knee pain, no edema  Neuro: no new deficits   Skin: No rashes  Psych: Normal affect and demeanor, alert and oriented x3    Data Reviewed:  I have personally reviewed following labs and imaging studies  Micro Results Recent Results (from the past 240 hour(s))  MRSA PCR Screening     Status: None   Collection Time: 09/22/16 11:26 PM  Result Value Ref Range Status   MRSA by PCR NEGATIVE NEGATIVE Final    Comment:        The GeneXpert MRSA Assay (FDA approved for NASAL specimens only), is one component of a comprehensive MRSA colonization surveillance program. It is not intended to diagnose MRSA infection nor to guide or monitor treatment for MRSA infections.     Radiology Reports Dg Chest 2 View  Result Date: 09/22/2016 CLINICAL DATA:  Sickle cell pain.  Chest pain. EXAM: CHEST  2 VIEW COMPARISON:  05/10/2015 FINDINGS: Low lung volumes. In combination with habitus this limits assessment. Allowing for this, no evidence of focal airspace disease. Prominent heart size is accentuated by technique. Suspect peribronchial cuffing. No pleural fluid. No pneumothorax. There is degenerative change in the spine. IMPRESSION: Low lung volumes. Peribronchial cuffing can be seen with bronchitis. No focal airspace disease. Electronically Signed   By: Rubye Oaks M.D.   On:  09/22/2016 05:03   Ct Angio Chest Pe W Or Wo Contrast  Result Date: 09/22/2016 CLINICAL DATA:  Moderate back pain beginning last night, associated BILATERAL leg pain, chest pain, history sickle cell disease, diabetes mellitus, hypertension EXAM:  CT ANGIOGRAPHY CHEST WITH CONTRAST TECHNIQUE: Multidetector CT imaging of the chest was performed using the standard protocol during bolus administration of intravenous contrast. Multiplanar CT image reconstructions and MIPs were obtained to evaluate the vascular anatomy. CONTRAST:  100 cc Isovue 370 IV COMPARISON:  None FINDINGS: Cardiovascular: Aorta normal caliber without aneurysm or dissection. Satisfactory opacification of the pulmonary arteries to the segmental level. No evidence of pulmonary embolism. Normal heart size. No pericardial effusion. Mediastinum/Nodes: Normal sized axillary nodes bilaterally. 7 mm short axis RIGHT paratracheal node image 23. No thoracic adenopathy. Esophagus unremarkable. Base of cervical region normal appearance. Lungs/Pleura: Lungs clear.  No pleural effusion or pneumothorax. 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 Otherwise normal appearance. Musculoskeletal: Scattered endplate spur formation thoracic spine. Sclerosis within the T10 vertebral body with minimal central height loss, nonspecific but could be related to sickle cell disease ; this is been present since at least 08/12/2013. Central concavities of multiple vertebral endplates. Review of the MIP images confirms the above findings. IMPRESSION: No evidence of pulmonary embolism. No acute intrathoracic abnormalities. Electronically Signed   By: Ulyses SouthwardMark  Boles M.D.   On: 09/22/2016 09:36    Lab Data:  CBC:  Recent Labs Lab 09/22/16 0350 09/23/16 0046  WBC 13.4* 15.5*  NEUTROABS 9.0*  --   HGB 11.6* 11.2*  HCT 32.2* 31.1*  MCV 83.2 82.5  PLT 275 123*   Basic Metabolic Panel:  Recent Labs Lab 09/22/16 0350 09/22/16 1251 09/23/16 0046  NA 144 142 136    K 3.1* 3.3* 3.5  CL 107 103 99*  CO2 24 28 25   GLUCOSE 113* 115* 136*  BUN 23* 21* 21*  CREATININE 1.60* 1.18 1.19  CALCIUM 9.8 10.0 9.5  MG  --  1.9  --    GFR: CrCl cannot be calculated (Unknown ideal weight.). Liver Function Tests:  Recent Labs Lab 09/22/16 0350 09/22/16 1251  AST 91* 99*  ALT 158* 152*  ALKPHOS 49 54  BILITOT 1.2 1.7*  PROT 7.4 7.8  ALBUMIN 4.6 4.8   No results for input(s): LIPASE, AMYLASE in the last 168 hours. No results for input(s): AMMONIA in the last 168 hours. Coagulation Profile: No results for input(s): INR, PROTIME in the last 168 hours. Cardiac Enzymes:  Recent Labs Lab 09/22/16 0350 09/22/16 0612 09/22/16 0652 09/22/16 1251  TROPONINI 0.04* 0.08* 0.09* 0.05*   BNP (last 3 results) No results for input(s): PROBNP in the last 8760 hours. HbA1C: No results for input(s): HGBA1C in the last 72 hours. CBG:  Recent Labs Lab 09/22/16 1612 09/22/16 2103 09/23/16 0744  GLUCAP 112* 160* 143*   Lipid Profile: No results for input(s): CHOL, HDL, LDLCALC, TRIG, CHOLHDL, LDLDIRECT in the last 72 hours. Thyroid Function Tests: No results for input(s): TSH, T4TOTAL, FREET4, T3FREE, THYROIDAB in the last 72 hours. Anemia Panel:  Recent Labs  09/22/16 0350  RETICCTPCT 7.4*   Urine analysis:    Component Value Date/Time   COLORURINE YELLOW 09/22/2016 0348   APPEARANCEUR CLEAR 09/22/2016 0348   LABSPEC 1.009 09/22/2016 0348   PHURINE 5.0 09/22/2016 0348   GLUCOSEU NEGATIVE 09/22/2016 0348   HGBUR NEGATIVE 09/22/2016 0348   BILIRUBINUR NEGATIVE 09/22/2016 0348   KETONESUR NEGATIVE 09/22/2016 0348   PROTEINUR 30 (A) 09/22/2016 0348   UROBILINOGEN 1.0 05/11/2015 0219   NITRITE NEGATIVE 09/22/2016 0348   LEUKOCYTESUR NEGATIVE 09/22/2016 0348     Lulia Schriner M.D. [facility] 09/23/2016, 11:02 AM  Pager: [phone] Between 7am to 7pm - call Pager -  [phone]  After 7pm go to [email or web address] - password [email or web address]  Call  night coverage person covering after 7pm

## 2016-09-24 ENCOUNTER — Inpatient Hospital Stay (HOSPITAL_COMMUNITY): Payer: Commercial Managed Care - HMO

## 2016-09-24 DIAGNOSIS — D5700 Hb-SS disease with crisis, unspecified: Secondary | ICD-10-CM

## 2016-09-24 LAB — CBC WITH DIFFERENTIAL/PLATELET
Basophils Absolute: 0 10*3/uL (ref 0.0–0.1)
Basophils Relative: 0 %
Eosinophils Absolute: 0.4 10*3/uL (ref 0.0–0.7)
Eosinophils Relative: 3 %
HCT: 24.6 % — ABNORMAL LOW (ref 39.0–52.0)
Hemoglobin: 8.8 g/dL — ABNORMAL LOW (ref 13.0–17.0)
Lymphocytes Relative: 13 %
Lymphs Abs: 1.9 10*3/uL (ref 0.7–4.0)
MCH: 28.9 pg (ref 26.0–34.0)
MCHC: 35.8 g/dL (ref 30.0–36.0)
MCV: 80.7 fL (ref 78.0–100.0)
Monocytes Absolute: 0.8 10*3/uL (ref 0.1–1.0)
Monocytes Relative: 6 %
Neutro Abs: 12 10*3/uL — ABNORMAL HIGH (ref 1.7–7.7)
Neutrophils Relative %: 79 %
Platelets: 63 10*3/uL — ABNORMAL LOW (ref 150–400)
RBC: 3.05 MIL/uL — ABNORMAL LOW (ref 4.22–5.81)
RDW: 16.9 % — ABNORMAL HIGH (ref 11.5–15.5)
WBC: 15.1 10*3/uL — ABNORMAL HIGH (ref 4.0–10.5)

## 2016-09-24 LAB — URINALYSIS, ROUTINE W REFLEX MICROSCOPIC
Bacteria, UA: NONE SEEN
Bilirubin Urine: NEGATIVE
Glucose, UA: NEGATIVE mg/dL
Hgb urine dipstick: NEGATIVE
Ketones, ur: NEGATIVE mg/dL
Leukocytes, UA: NEGATIVE
Nitrite: NEGATIVE
Protein, ur: 100 mg/dL — AB
Specific Gravity, Urine: 1.013 (ref 1.005–1.030)
Squamous Epithelial / LPF: NONE SEEN
pH: 5 (ref 5.0–8.0)

## 2016-09-24 LAB — LACTIC ACID, PLASMA
Lactic Acid, Venous: 1.5 mmol/L (ref 0.5–1.9)
Lactic Acid, Venous: 0.7 mmol/L (ref 0.5–1.9)

## 2016-09-24 LAB — BASIC METABOLIC PANEL
Anion gap: 7 (ref 5–15)
BUN: 22 mg/dL — ABNORMAL HIGH (ref 6–20)
CO2: 30 mmol/L (ref 22–32)
Calcium: 8.5 mg/dL — ABNORMAL LOW (ref 8.9–10.3)
Chloride: 101 mmol/L (ref 101–111)
Creatinine, Ser: 1.3 mg/dL — ABNORMAL HIGH (ref 0.61–1.24)
GFR calc Af Amer: >60 mL/min (ref >60)
GFR calc non Af Amer: >60 mL/min (ref >60)
Glucose, Bld: 140 mg/dL — ABNORMAL HIGH (ref 65–99)
Potassium: 3.7 mmol/L (ref 3.5–5.1)
Sodium: 138 mmol/L (ref 135–145)

## 2016-09-24 LAB — HEPATIC FUNCTION PANEL
ALT: 71 U/L — ABNORMAL HIGH (ref 17–63)
AST: 53 U/L — ABNORMAL HIGH (ref 15–41)
Albumin: 3.1 g/dL — ABNORMAL LOW (ref 3.5–5.0)
Alkaline Phosphatase: 125 U/L (ref 38–126)
Bilirubin, Direct: 0.4 mg/dL (ref 0.1–0.5)
Indirect Bilirubin: 1.2 mg/dL — ABNORMAL HIGH (ref 0.3–0.9)
Total Bilirubin: 1.6 mg/dL — ABNORMAL HIGH (ref 0.3–1.2)
Total Protein: 6.4 g/dL — ABNORMAL LOW (ref 6.5–8.1)

## 2016-09-24 LAB — HEMOGLOBIN A1C
Hgb A1c MFr Bld: <4.2 % — ABNORMAL LOW (ref 4.8–5.6)
Mean Plasma Glucose: <74 mg/dL

## 2016-09-24 LAB — CBC
HCT: 29.5 % — ABNORMAL LOW (ref 39.0–52.0)
Hemoglobin: 10.3 g/dL — ABNORMAL LOW (ref 13.0–17.0)
MCH: 29.3 pg (ref 26.0–34.0)
MCHC: 34.9 g/dL (ref 30.0–36.0)
MCV: 84 fL (ref 78.0–100.0)
Platelets: 68 10*3/uL — ABNORMAL LOW (ref 150–400)
RBC: 3.51 MIL/uL — ABNORMAL LOW (ref 4.22–5.81)
RDW: 17.2 % — ABNORMAL HIGH (ref 11.5–15.5)
WBC: 16.5 10*3/uL — ABNORMAL HIGH (ref 4.0–10.5)

## 2016-09-24 LAB — GLUCOSE, CAPILLARY
Glucose-Capillary: 131 mg/dL — ABNORMAL HIGH (ref 65–99)
Glucose-Capillary: 152 mg/dL — ABNORMAL HIGH (ref 65–99)
Glucose-Capillary: 115 mg/dL — ABNORMAL HIGH (ref 65–99)
Glucose-Capillary: 122 mg/dL — ABNORMAL HIGH (ref 65–99)

## 2016-09-24 LAB — PROCALCITONIN: Procalcitonin: 1.98 ng/mL

## 2016-09-24 MED ORDER — WHITE PETROLATUM GEL
Status: DC | PRN
  Filled 2016-09-24: qty 1

## 2016-09-24 MED ORDER — DEXTROSE 5 % IV SOLN
1.0000 g | Freq: Three times a day (TID) | INTRAVENOUS | Status: DC
  Administered 2016-09-24 – 2016-09-29 (×14): 1 g via INTRAVENOUS
  Filled 2016-09-24 (×17): qty 1

## 2016-09-24 MED ORDER — FUROSEMIDE 10 MG/ML IJ SOLN
INTRAMUSCULAR | Status: AC
  Administered 2016-09-24 (×2): 20 mg
  Filled 2016-09-24: qty 4

## 2016-09-24 MED ORDER — VANCOMYCIN HCL IN DEXTROSE 1-5 GM/200ML-% IV SOLN
1000.0000 mg | Freq: Three times a day (TID) | INTRAVENOUS | Status: DC
  Administered 2016-09-24 – 2016-09-29 (×14): 1000 mg via INTRAVENOUS
  Filled 2016-09-24 (×16): qty 200

## 2016-09-24 MED ORDER — VANCOMYCIN HCL 10 G IV SOLR
2000.0000 mg | Freq: Once | INTRAVENOUS | Status: AC
  Administered 2016-09-24: 2000 mg via INTRAVENOUS
  Filled 2016-09-24: qty 2000

## 2016-09-24 NOTE — Plan of Care (Signed)
Problem: Pain Managment: Goal: General experience of comfort will improve Outcome: Progressing PCA infusing. Pt maintains that pain is rated a 6/10 at all pain evaluations. Pt doesn't appear to be in any distress; asleep most of shift.

## 2016-09-24 NOTE — Significant Event (Signed)
Rapid Response Event Note  Overview: Time Called: 1641 Arrival Time: 1701 Event Type: Respiratory  Initial Focused Assessment: Low O2 sats.   Interventions: IS with instructions and Lasix per orders  Plan of Care (if not transferred):  Event Summary:  Called to assist with care of patient with a drop in O2 sats 89-91%. Patient is alert and responsive. No signs of acute distress. Neuro intact. Skin is warm and dry. Heart sounds regular but tachycardiac at 106. Lungs with rales and rhonchi in the bases. Resp. Rate 41. Airway is patent. Remains on O2.at 5l. Chest x-ray from this am was noted. Dr. Sunnie Nielsenegalado also reported to bedside. Patient was ordered IV Lasix. We also encouraged us of the incentive spirometry. We will continue to assist with care as needed.   Beryl MeagerHarbison, [place]

## 2016-09-24 NOTE — Consult Note (Signed)
Name: Nicolas [name] MRN: [id number] DOB: 14-Apr-1976    ADMISSION DATE:  09/22/2016 CONSULTATION DATE:  12/27  REFERRING MD :  Dr. Tyrell Antonio   CHIEF COMPLAINT:  Hypoxia   HISTORY OF PRESENT ILLNESS 40 year old male with PMH of sickle cell disease (Follows Dr. Soledad Gerlach at [facility]), DM, depression, and HTN. Presented to ED on 12/25 with severe knee, lower back, and chest pain. Patient states that chest pain is atypical with chronic sickle cell pain. Attempted to take home Percocet, however, this did not relieve any of the pain. Troponin was 0.09 with no acute ST-T wave abnormalities. Cardiology was consulted and patient was admitted under [facility].   On 12/27 patient became acutely hypoxic (requiring 5L [place]), febrile, and tachycardic. CXR revealed pulmonary vascular congestion. PCCM was asked to consult.   SIGNIFICANT EVENTS  12/25 > Presents ED with Chest, Knee, and Back Pain  12/27 > New Hypoxia and Leukocytosis with CXR showing pulmonary vascular congestion   STUDIES:  CTA > Neg PE, Normal heart and no pericardial effusion  Echo 12/26 > EF 55-60, mild concentric hypertrophy  CXR 12/27 > pulmonary vascular congestion without frank congestive heart failure   Cultures:  Blood 12/26 > U/A 12/27 > Neg Urine 12/27 >   Antibiotics:  Vancomycin 12/27 > Cefepime 12/27 >   PAST MEDICAL HISTORY :   has a past medical history of Arthritis; Depression; Diabetes mellitus ([place]); HTN (hypertension) (05/10/2015); Osteoarthritis of left hip (06/07/2012); and Sickle cell anemia ([place]).  has a past surgical history that includes Joint replacement and Total hip arthroplasty (06/07/2012). Prior to Admission medications   Medication Sig Start Date End Date Taking? Authorizing Provider  amLODipine (NORVASC) 10 MG tablet Take 10 mg by mouth daily. 11/25/14  Yes Historical Provider, MD  carvedilol (COREG) 25 MG tablet Take 25 mg by mouth 2 (two) times daily. 01/07/16  Yes Historical Provider, MD  Cholecalciferol  (VITAMIN D3) 2000 units capsule Take 2,000 Units by mouth daily. 02/05/16  Yes Historical Provider, MD  folic acid (FOLVITE) 1 MG tablet Take 1 mg by mouth daily. 12/23/14  Yes Historical Provider, MD  glucose blood test strip Use as instructed Patient taking differently: 1 each by Other route every other day. Use as instructed 08/15/13  Yes Adeline C Viyuoh, MD  glucose monitoring kit (FREESTYLE) monitoring kit Glucometer- ACCUCHECK BRAND 08/15/13  Yes Adeline C Viyuoh, MD  JANUVIA 100 MG tablet Take 100 mg by mouth daily. 02/08/15  Yes Historical Provider, MD  Lancets (ACCU-CHEK MULTICLIX) lancets Use as instructed Patient taking differently: 1 each by Other route every other day.  08/15/13  Yes Adeline C Viyuoh, MD  lisinopril (PRINIVIL,ZESTRIL) 5 MG tablet Take 5 mg by mouth daily. 04/17/15  Yes Historical Provider, MD  meloxicam (MOBIC) 15 MG tablet Take 15 mg by mouth daily as needed for pain.  01/29/16  Yes Historical Provider, MD  metFORMIN (GLUCOPHAGE) 500 MG tablet Take 500 mg by mouth 2 (two) times daily with a meal.   Yes Historical Provider, MD  orphenadrine (NORFLEX) 100 MG tablet Take 1 tablet (100 mg total) by mouth 2 (two) times daily. 01/11/15  Yes Charlesetta Shanks, MD  oxyCODONE-acetaminophen (PERCOCET) 7.5-325 MG tablet Take 1 tablet by mouth every 4 (four) hours as needed for severe pain.   Yes Historical Provider, MD  triamterene-hydrochlorothiazide (MAXZIDE-25) 37.5-25 MG per tablet Take 1 tablet by mouth daily. 12/30/14  Yes Historical Provider, MD   No Known Allergies  FAMILY HISTORY:  family history  includes Cancer - Other in his father. SOCIAL HISTORY:  reports that he has never smoked. He has never used smokeless tobacco. He reports that he drinks about 0.5 oz of alcohol per week . He reports that he does not use drugs.  REVIEW OF SYSTEMS:   All negative; except for those that are bolded, which indicate positives.  Constitutional: weight loss, weight gain, night sweats,  fevers, chills, fatigue, weakness.  HEENT: headaches, sore throat, sneezing, nasal congestion, post nasal drip, difficulty swallowing, tooth/dental problems, visual complaints, visual changes, ear aches. Neuro: difficulty with speech, weakness, numbness, ataxia. CV:  chest pain, orthopnea, PND, swelling in lower extremities, dizziness, palpitations, syncope.  Resp: cough, hemoptysis, dyspnea, wheezing. GI: heartburn, indigestion, abdominal pain, nausea, vomiting, diarrhea, constipation, change in bowel habits, loss of appetite, hematemesis, melena, hematochezia.  GU: dysuria, change in color of urine, urgency or frequency, flank pain, hematuria. MSK: joint pain or swelling, decreased range of motion. Psych: change in mood or affect, depression, anxiety, suicidal ideations, homicidal ideations. Skin: rash, itching, bruising.  SUBJECTIVE:  Lying in bed, no distress, on 5L [place] and PCA pump.   VITAL SIGNS: Temp:  [100 F (37.8 C)-102.9 F (39.4 C)] 101.9 F (38.8 C) (12/27 1617) Pulse Rate:  [95-104] 95 (12/26 2300) Resp:  [20-29] 29 (12/27 1655) BP: (115-157)/(61-106) 135/81 (12/27 1617) SpO2:  [90 %-97 %] 90 % (12/27 1655) Weight:  [100 kg (220 lb 6.4 oz)] 100 kg (220 lb 6.4 oz) (12/27 0337)  PHYSICAL EXAMINATION: General: Adult male, no acute distress   Neuro: alert, oriented, follows commands   HEENT: normocephalic  Cardiovascular: no MRG, Tachy, NI S1/S2 Lungs: unlabored, diffuse rhonchi throughout Abdomen: non-tender, non-distended, active bowel sounds  Musculoskeletal: no deformities  Skin: Warm, dry, intact     Recent Labs Lab 09/22/16 1251 09/23/16 0046 09/24/16 0425  NA 142 136 138  K 3.3* 3.5 3.7  CL 103 99* 101  CO2 '28 25 30  ' BUN 21* 21* 22*  CREATININE 1.18 1.19 1.30*  GLUCOSE 115* 136* 140*    Recent Labs Lab 09/22/16 0350 09/23/16 0046 09/24/16 0425  HGB 11.6* 11.2* 10.3*  HCT 32.2* 31.1* 29.5*  WBC 13.4* 15.5* 16.5*  PLT 275 123* 68*   Dg Chest 2  View  Result Date: 09/24/2016 CLINICAL DATA:  Sickle cell crisis.  Fever. EXAM: CHEST  2 VIEW COMPARISON:  Chest radiograph and chest CT September 22, 2016 FINDINGS: There is no appreciable edema or consolidation. Heart is mildly enlarged with pulmonary venous hypertension. No adenopathy. 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 There are multiple vertebral body endplate infarcts, stable. IMPRESSION: There is a degree of pulmonary vascular congestion without frank congestive heart failure. No airspace consolidation. Bony changes consistent with sickle cell disease present. Electronically Signed   By: Lowella Grip III M.D.   On: 09/24/2016 14:18    ASSESSMENT / PLAN:  Acute Hypoxic Respiratory Failure  -Maintain oxygen saturation >90 -BiPAP if needed / low thresh hold for intubation  -Follow CXR  Sepsis with unknown etiology in setting of Sickle Cell Crisis - Febrile, leukocytosis  -Follow Culture Data -Trend Fever and WBC curve -Trend Lactic Acid and Procal  -Continue Vanc and Cefepime  -Consult Hematology      Thrombocytopenia  -Trend CBC -Trend Reticulocyte count  -Trend LFT   Hayden Pedro, AG-ACNP [facility]  Pgr: [phone]  PCCM Pgr: [phone]

## 2016-09-24 NOTE — Progress Notes (Addendum)
At 1630 RN called to room due to low O2 sat (80's) and increased respiratory rate.  Patient Alert and Oriented, denies SOB or difficulty breathing.  Patient repositioned in bed with no change to O2 sats.  Patient's O2 via [place] increased to 5 L from 3L.  O2 sats increased to 90%.  O2 sats would not hold continuously.  Dr Sunnie Nielsenegalado and Rapid Response called to assess situation.  IV lasix 40 mg ordered to be given in split dose per Dr. Sunnie Nielsenegalado.  PCCM consulted.  Jovita KussmaulKatalina Eubanks NP placed patient on 10L 45% Venti mask.  Patient's O2 sat now mid to upper 90's.

## 2016-09-24 NOTE — Progress Notes (Signed)
Pharmacy Antibiotic Note  Nicolas [name] is a 40 y.o. male here with with sickle cell crisis and now with possible sepsis.  Pharmacy has been consulted for cefepime and vancomycin dosing. -WBC= 16.5, tmax= 103.6, SCr= 1.3 and CrCl ~ 80  Plan: -Vancomycin 2000mg  IV x1 followed by 1000mg  IV q8h -Cefepime 1gm IV q8h -Will follow renal function, cultures and clinical progress   Height: 5\' 7"  (170.2 cm) Weight: 220 lb 6.4 oz (100 kg) IBW/kg (Calculated) : 66.1  Temp (24hrs), Avg:102.4 F (39.1 C), Min:100 F (37.8 C), Max:103.6 F (39.8 C)   Recent Labs Lab 09/22/16 0350 09/22/16 1251 09/23/16 0046 09/24/16 0425  WBC 13.4*  --  15.5* 16.5*  CREATININE 1.60* 1.18 1.19 1.30*    Estimated Creatinine Clearance: 83.4 mL/min (by C-G formula based on SCr of 1.3 mg/dL (H)).    No Known Allergies  Antimicrobials this admission: 12/27 cefepime 12/27 vanc  Dose adjustments this admission:   Microbiology results: 12/25 MRSA PCR- neg 12/26 blood x2  Thank you for allowing pharmacy to be a part of this patient's care.  Harland GermanAndrew Breigh Annett, Pharm D 09/24/2016 11:30 AM

## 2016-09-24 NOTE — Progress Notes (Signed)
[facility]                                                                              Patient Demographics  Nicolas [name], is a 40 y.o. male, DOB - 10/17/1975, ZOX:[id number]  Admit date - 09/22/2016   Admitting Physician Nicolas Jenna LuoK Rai, MD  Outpatient Primary MD for the patient is Nicolas Alimentobyn N Sanders, MD  Outpatient specialists:   LOS - 2  days    Chief Complaint  Patient presents with  . Sickle Cell Pain Crisis       Brief summary  Patient is a 40 y.o. male with  sickle cell disease and NIDDM and HTN presented with severe knee pain, low back pain and chest pain.  The patient was in his usual state of health until several hours before arrival when he had severe onset of knee pain, low back pain, and chest pain. The chest pain is aching, constant, severe, and not typical of his sickle cell. He has no fever, sputum. The knee pain and low back pain are typical of his sickle cell pain, however this did not respond to his home Percocet so he came to the emergency room.  Assessment & Plan   Acute hypoxic respiratory failure; Patient more hypoxic, now on 5 L oxygen, sat 90 %. Chest x ray with vascular congestion.  Patient with fevers, hypoxemia, history sickle cell.  IV lasix one time dose order. Will monitor BP closely.  CCM consulted.  Continue with IV antibiotics.  Due to fever check lactic acids.   Fever, concern for sepsis.  Added IV antibiotics today.  Check lactic acid.    Vaso-occlussive sickle cell pain crisis: -Pain has been improving - Continue per sickle cell protocol for pain control.   - Placed on IV hydromorphone PCA  -Hold home Percocet (only takes 2-3 per day), IV Toradol contraindicated with troponin leak and AKI -hold IV fluids due to worsening hypoxemia and vascular congestion.  -Diphenhydramine, ondansetron and bowel regimen   Elevated troponin:  - Troponins were trending up hence cardiology was consulted.  Per cardiology, chest pain is  not likely related to myocardial ischemia and likely due to sickle cell crisis. Recommended treating sickle cell crisis and obtain 2-D echo. If echo is normal can consider repeat stress test as outpatient. Cardiology signed off. - D-dimer elevated, as CT angiogram of the chest obtained, negative for pulmonary embolism  -Follow 2-D echo   Sickle cell anemia:  - Baseline Hgb 11Currently at baseline -Transfuse as needed if Hg drops significantly below baseline.   Acute kidney injury:  - Likely due to #1, dehydration, hold HCTZ - placed on IV fluid hydrationcreatinine improving, hold  lisinopril   Elevated LFTs:  Have been elevated in past.  Hep serologies negative in 2016. No previous liver imaging.  No abdominal pain at all. -Trend LFT -denies abdominal pain.   NIDDM:  -Hold home metformin and Januvia -Continue sliding scale insulin  Hypertension:  -Continue amlodipine, carvedilol - restart lisinopril  - Continue to hold HCTZ, triamterene   Code Status: Full CODE STATUS DVT Prophylaxis:  He has been refusion lovenox, platelet at  1.  Family Communication: Discussed in detail with the patient, all imaging results, lab results explained to the patient   Disposition Plan:  Time Spent in minutes   25 minutes  Procedures:    Consultants:   Cardiology  Antimicrobials:      Medications  Scheduled Meds: . carvedilol  25 mg Oral BID  . ceFEPime (MAXIPIME) IV  1 g Intravenous Q8H  . HYDROmorphone   Intravenous Q4H  . insulin aspart  0-15 Units Subcutaneous TID WC  . insulin aspart  0-5 Units Subcutaneous QHS  . senna-docusate  1 tablet Oral BID  . vancomycin  1,000 mg Intravenous Q8H   Continuous Infusions: . sodium chloride 100 mL/hr at 09/24/16 1237   PRN Meds:.   Antibiotics   Anti-infectives    Start     Dose/Rate Route Frequency Ordered Stop   09/24/16 2100  vancomycin (VANCOCIN) IVPB 1000 mg/200 mL premix     1,000 mg 200 mL/hr over 60 Minutes  Intravenous Every 8 hours 09/24/16 1132     09/24/16 1230  vancomycin (VANCOCIN) 2,000 mg in sodium chloride 0.9 % 500 mL IVPB     2,000 mg 250 mL/hr over 120 Minutes Intravenous  Once 09/24/16 1132 09/24/16 1622   09/24/16 1230  ceFEPIme (MAXIPIME) 1 g in dextrose 5 % 50 mL IVPB     1 g 100 mL/hr over 30 Minutes Intravenous Every 8 hours 09/24/16 1132          Subjective:   Nicolas [name] was seen and examined early today, he was feeling better, pain was better.  This afternoon develops worsening hypoxemia. He denies chest pain. Denies worsening dyspnea. Feeling tired.    Objective:   Vitals:   09/24/16 1100 09/24/16 1246 09/24/16 1617 09/24/16 1655  BP:   135/81   Pulse:      Resp:  (!) 22  (!) 29  Temp: 100.3 F (37.9 C)  (!) 101.9 F (38.8 C)   TempSrc: Oral  Oral   SpO2: 97% 94% 94% 90%  Weight:      Height:        Intake/Output Summary (Last 24 hours) at 09/24/16 1708 Last data filed at 09/24/16 1124  Gross per 24 hour  Intake              222 ml  Output             1800 ml  Net            -1578 ml     Wt Readings from Last 3 Encounters:  09/24/16 100 kg (220 lb 6.4 oz)  05/12/15 112 kg (246 lb 15.7 oz)  01/11/15 108.9 kg (240 lb)     Exam  General: Alert and oriented x 3, ill appearing   Cardiovascular: S1 S2 auscultated, no rubs, murmurs or gallops. Regular rate and rhythm.  Respiratory: tachypnea, bilateral crackles, ronchus   Gastrointestinal: Soft, nontender, nondistended, + bowel sounds  Ext: no cyanosis clubbing or edema, knee pain, no edema  Neuro: no new deficits   Skin: No rashes  Psych: Normal affect and demeanor, alert and oriented x3    Data Reviewed:  I have personally reviewed following labs and imaging studies  Micro Results Recent Results (from the past 240 hour(s))  MRSA PCR Screening     Status: None   Collection Time: 09/22/16 11:26 PM  Result Value Ref Range Status   MRSA by PCR NEGATIVE NEGATIVE Final    Comment:  The GeneXpert MRSA Assay (FDA approved for NASAL specimens only), is one component of a comprehensive MRSA colonization surveillance program. It is not intended to diagnose MRSA infection nor to guide or monitor treatment for MRSA infections.   Culture, blood (Routine X 2) w Reflex to ID Panel     Status: None (Preliminary result)   Collection Time: 09/23/16 12:46 AM  Result Value Ref Range Status   Specimen Description BLOOD RIGHT HAND  Final   Special Requests IN PEDIATRIC BOTTLE 4CC  Final   Culture NO GROWTH 1 DAY  Final   Report Status PENDING  Incomplete  Culture, blood (Routine X 2) w Reflex to ID Panel     Status: None (Preliminary result)   Collection Time: 09/23/16 12:54 AM  Result Value Ref Range Status   Specimen Description BLOOD LEFT HAND  Final   Special Requests BOTTLES DRAWN AEROBIC AND ANAEROBIC 5CC  Final   Culture NO GROWTH 1 DAY  Final   Report Status PENDING  Incomplete    Radiology Reports Dg Chest 2 View  Result Date: 09/24/2016 CLINICAL DATA:  Sickle cell crisis.  Fever. EXAM: CHEST  2 VIEW COMPARISON:  Chest radiograph and chest CT September 22, 2016 FINDINGS: There is no appreciable edema or consolidation. Heart is mildly enlarged with pulmonary venous hypertension. No adenopathy. 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 There are multiple vertebral body endplate infarcts, stable. IMPRESSION: There is a degree of pulmonary vascular congestion without frank congestive heart failure. No airspace consolidation. Bony changes consistent with sickle cell disease present. Electronically Signed   By: Bretta Bang III M.D.   On: 09/24/2016 14:18   Dg Chest 2 View  Result Date: 09/22/2016 CLINICAL DATA:  Sickle cell pain.  Chest pain. EXAM: CHEST  2 VIEW COMPARISON:  05/10/2015 FINDINGS: Low lung volumes. In combination with habitus this limits assessment. Allowing for this, no evidence of focal airspace disease. Prominent heart size is  accentuated by technique. Suspect peribronchial cuffing. No pleural fluid. No pneumothorax. There is degenerative change in the spine. IMPRESSION: Low lung volumes. Peribronchial cuffing can be seen with bronchitis. No focal airspace disease. Electronically Signed   By: Rubye Oaks M.D.   On: 09/22/2016 05:03   Ct Angio Chest Pe W Or Wo Contrast  Result Date: 09/22/2016 CLINICAL DATA:  Moderate back pain beginning last night, associated BILATERAL leg pain, chest pain, history sickle cell disease, diabetes mellitus, hypertension EXAM: CT ANGIOGRAPHY CHEST WITH CONTRAST TECHNIQUE: Multidetector CT imaging of the chest was performed using the standard protocol during bolus administration of intravenous contrast. Multiplanar CT image reconstructions and MIPs were obtained to evaluate the vascular anatomy. CONTRAST:  100 cc Isovue 370 IV COMPARISON:  None FINDINGS: Cardiovascular: Aorta normal caliber without aneurysm or dissection. Satisfactory opacification of the pulmonary arteries to the segmental level. No evidence of pulmonary embolism. Normal heart size. No pericardial effusion. Mediastinum/Nodes: Normal sized axillary nodes bilaterally. 7 mm short axis RIGHT paratracheal node image 23. No thoracic adenopathy. Esophagus unremarkable. Base of cervical region normal appearance. Lungs/Pleura: Lungs clear.  No pleural effusion or pneumothorax. 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 Otherwise normal appearance. Musculoskeletal: Scattered endplate spur formation thoracic spine. Sclerosis within the T10 vertebral body with minimal central height loss, nonspecific but could be related to sickle cell disease ; this is been present since at least 08/12/2013. Central concavities of multiple vertebral endplates. Review of the MIP images confirms the above findings. IMPRESSION: No evidence of pulmonary embolism. No  acute intrathoracic abnormalities. Electronically Signed   By: Ulyses SouthwardMark  Boles M.D.   On:  09/22/2016 09:36    Lab Data:  CBC:  Recent Labs Lab 09/22/16 0350 09/23/16 0046 09/24/16 0425  WBC 13.4* 15.5* 16.5*  NEUTROABS 9.0*  --   --   HGB 11.6* 11.2* 10.3*  HCT 32.2* 31.1* 29.5*  MCV 83.2 82.5 84.0  PLT 275 123* 68*   Basic Metabolic Panel:  Recent Labs Lab 09/22/16 0350 09/22/16 1251 09/23/16 0046 09/24/16 0425  NA 144 142 136 138  K 3.1* 3.3* 3.5 3.7  CL 107 103 99* 101  CO2 24 28 25 30   GLUCOSE 113* 115* 136* 140*  BUN 23* 21* 21* 22*  CREATININE 1.60* 1.18 1.19 1.30*  CALCIUM 9.8 10.0 9.5 8.5*  MG  --  1.9  --   --    GFR: Estimated Creatinine Clearance: 83.4 mL/min (by C-G formula based on SCr of 1.3 mg/dL (H)). Liver Function Tests:  Recent Labs Lab 09/22/16 0350 09/22/16 1251  AST 91* 99*  ALT 158* 152*  ALKPHOS 49 54  BILITOT 1.2 1.7*  PROT 7.4 7.8  ALBUMIN 4.6 4.8   No results for input(s): LIPASE, AMYLASE in the last 168 hours. No results for input(s): AMMONIA in the last 168 hours. Coagulation Profile: No results for input(s): INR, PROTIME in the last 168 hours. Cardiac Enzymes:  Recent Labs Lab 09/22/16 0350 09/22/16 0612 09/22/16 0652 09/22/16 1251  TROPONINI 0.04* 0.08* 0.09* 0.05*   BNP (last 3 results) No results for input(s): PROBNP in the last 8760 hours. HbA1C:  Recent Labs  09/23/16 0046  HGBA1C <4.2*   CBG:  Recent Labs Lab 09/23/16 1612 09/23/16 2013 09/24/16 0728 09/24/16 1123 09/24/16 1613  GLUCAP 148* 154* 131* 152* 115*   Lipid Profile: No results for input(s): CHOL, HDL, LDLCALC, TRIG, CHOLHDL, LDLDIRECT in the last 72 hours. Thyroid Function Tests: No results for input(s): TSH, T4TOTAL, FREET4, T3FREE, THYROIDAB in the last 72 hours. Anemia Panel:  Recent Labs  09/22/16 0350  RETICCTPCT 7.4*   Urine analysis:    Component Value Date/Time   COLORURINE YELLOW 09/24/2016 1203   APPEARANCEUR CLEAR 09/24/2016 1203   LABSPEC 1.013 09/24/2016 1203   PHURINE 5.0 09/24/2016 1203    GLUCOSEU NEGATIVE 09/24/2016 1203   HGBUR NEGATIVE 09/24/2016 1203   BILIRUBINUR NEGATIVE 09/24/2016 1203   KETONESUR NEGATIVE 09/24/2016 1203   PROTEINUR 100 (A) 09/24/2016 1203   UROBILINOGEN 1.0 05/11/2015 0219   NITRITE NEGATIVE 09/24/2016 1203   LEUKOCYTESUR NEGATIVE 09/24/2016 1203     Chey Rachels A M.D. [facility] 09/24/2016, 5:08 PM  Pager: [phone] Between 7am to 7pm - call Pager - [phone]  After 7pm go to [email or web address] - password [email or web address]  Call night coverage person covering after 7pm

## 2016-09-25 ENCOUNTER — Inpatient Hospital Stay (HOSPITAL_COMMUNITY): Payer: Commercial Managed Care - HMO

## 2016-09-25 DIAGNOSIS — J9601 Acute respiratory failure with hypoxia: Secondary | ICD-10-CM

## 2016-09-25 DIAGNOSIS — R0602 Shortness of breath: Secondary | ICD-10-CM

## 2016-09-25 DIAGNOSIS — M79609 Pain in unspecified limb: Secondary | ICD-10-CM

## 2016-09-25 DIAGNOSIS — R079 Chest pain, unspecified: Secondary | ICD-10-CM

## 2016-09-25 LAB — RESPIRATORY PANEL BY PCR

## 2016-09-25 LAB — BASIC METABOLIC PANEL
Anion gap: 9 (ref 5–15)
BUN: 23 mg/dL — ABNORMAL HIGH (ref 6–20)
CO2: 26 mmol/L (ref 22–32)
Calcium: 7.9 mg/dL — ABNORMAL LOW (ref 8.9–10.3)
Chloride: 103 mmol/L (ref 101–111)
Creatinine, Ser: 1.32 mg/dL — ABNORMAL HIGH (ref 0.61–1.24)
GFR calc Af Amer: >60 mL/min (ref >60)
GFR calc non Af Amer: >60 mL/min (ref >60)
Glucose, Bld: 125 mg/dL — ABNORMAL HIGH (ref 65–99)
Potassium: 3.6 mmol/L (ref 3.5–5.1)
Sodium: 138 mmol/L (ref 135–145)

## 2016-09-25 LAB — CBC
HCT: 24.5 % — ABNORMAL LOW (ref 39.0–52.0)
Hemoglobin: 8.7 g/dL — ABNORMAL LOW (ref 13.0–17.0)
MCH: 29.3 pg (ref 26.0–34.0)
MCHC: 35.5 g/dL (ref 30.0–36.0)
MCV: 82.5 fL (ref 78.0–100.0)
Platelets: 65 10*3/uL — ABNORMAL LOW (ref 150–400)
RBC: 2.97 MIL/uL — ABNORMAL LOW (ref 4.22–5.81)
RDW: 17.2 % — ABNORMAL HIGH (ref 11.5–15.5)
WBC: 14.7 10*3/uL — ABNORMAL HIGH (ref 4.0–10.5)

## 2016-09-25 LAB — URINE CULTURE: Culture: NO GROWTH

## 2016-09-25 LAB — GLUCOSE, CAPILLARY
Glucose-Capillary: 130 mg/dL — ABNORMAL HIGH (ref 65–99)
Glucose-Capillary: 134 mg/dL — ABNORMAL HIGH (ref 65–99)
Glucose-Capillary: 145 mg/dL — ABNORMAL HIGH (ref 65–99)
Glucose-Capillary: 139 mg/dL — ABNORMAL HIGH (ref 65–99)

## 2016-09-25 LAB — PROCALCITONIN: Procalcitonin: 1.69 ng/mL

## 2016-09-25 MED ORDER — OXYCODONE-ACETAMINOPHEN 5-325 MG PO TABS: 1.0000 | ORAL_TABLET | ORAL | Status: DC | PRN

## 2016-09-25 MED ORDER — POTASSIUM CHLORIDE CRYS ER 20 MEQ PO TBCR
40.0000 meq | EXTENDED_RELEASE_TABLET | Freq: Once | ORAL | Status: AC
  Administered 2016-09-25: 40 meq via ORAL
  Filled 2016-09-25: qty 2

## 2016-09-25 MED ORDER — LEVALBUTEROL HCL 0.63 MG/3ML IN NEBU: 0.6300 mg | INHALATION_SOLUTION | RESPIRATORY_TRACT | Status: DC | PRN

## 2016-09-25 MED ORDER — OXYCODONE HCL 5 MG PO TABS: 2.5000 mg | ORAL_TABLET | ORAL | Status: DC | PRN

## 2016-09-25 MED ORDER — FUROSEMIDE 10 MG/ML IJ SOLN
40.0000 mg | Freq: Once | INTRAMUSCULAR | Status: AC
  Administered 2016-09-25: 40 mg via INTRAVENOUS
  Filled 2016-09-25: qty 4

## 2016-09-25 MED ORDER — OXYCODONE-ACETAMINOPHEN 7.5-325 MG PO TABS: 1.0000 | ORAL_TABLET | ORAL | Status: DC | PRN

## 2016-09-25 MED ORDER — LORAZEPAM 2 MG/ML IJ SOLN: 1.0000 mg | Freq: Once | INTRAMUSCULAR | Status: DC

## 2016-09-25 MED ORDER — FOLIC ACID 1 MG PO TABS
2.0000 mg | ORAL_TABLET | Freq: Every day | ORAL | Status: DC
  Administered 2016-09-25 – 2016-09-29 (×5): 2 mg via ORAL
  Filled 2016-09-25 (×5): qty 2

## 2016-09-25 NOTE — Care Management Note (Signed)
Case Management Note  Patient Details  Name: Barkley BoardsKoffi Zuluaga MRN: [id number] Date of Birth: 12/11/1975  Subjective/Objective: Pt presented for Sickle Cell Pain Crisis and Acute Hypoxic Respiratory Failure. Pt initiated on IV antibiotic therapy. Plan to return home once stable.          Action/Plan: CM will continue to monitor for additional needs.   Expected Discharge Date:                  Expected Discharge Plan:  Home/Self Care  In-House Referral:  NA  Discharge planning Services  CM Consult  Post Acute Care Choice:  NA Choice offered to:  NA  DME Arranged:  N/A DME Agency:  NA  HH Arranged:  NA HH Agency:  NA  Status of Service:  Completed, signed off  If discussed at Long Length of Stay Meetings, dates discussed:    Additional Comments:  Gala LewandowskyGraves-Bigelow, Shevelle Smither Kaye, RN 09/25/2016, 11:25 AM

## 2016-09-25 NOTE — Progress Notes (Signed)
RT responded to a stat call due to desat. Pt had tried to get off the side of the bed to urinate and took himself off the oxygen. Sat dropped into the 60's.  RT placed pt on non rebreather, sat improved quickly to 100%. Pt placed back on venturi mask 8L, 40%, sat holding between 91- 95%.

## 2016-09-25 NOTE — Progress Notes (Signed)
PCCM Interval Progress Note  Called by Pola CornELINK MD and asked to assess pt at bedside for hypoxia and intermittent tachypnea.  Pt admitted with sickle cell crisis, had drop in sats 12/27 PM.  Started on Venti mask with improvement.  Also given 40mg  lasix with good UOP and improvement in respiratory rate. Now with intermittent tachypnea again and intermittent desaturations.  RN informs me that pt often takes his venti mask off so that he can drink water, and the minute that it comes off, he drops sats.  Sats do improve immediately once venti mask is re-applied.  Pt subjectively does not feel in distress. Denies any SOB / difficulty breathing.  Pain is controlled, roughly 4 to 5/10.  I reiterated to him the importance of keeping venti mask on.   VITAL SIGNS: Temp:  [99.1 F (37.3 C)-102.5 F (39.2 C)] 99.1 F (37.3 C) (12/28 0001) Pulse Rate:  [93-96] 93 (12/28 0001) Resp:  [22-29] 28 (12/28 0001) BP: (125-148)/(73-106) 139/85 (12/28 0001) SpO2:  [90 %-100 %] 96 % (12/28 0001) FiO2 (%):  [45 %] 45 % (12/28 0000) Weight:  [220 lb 6.4 oz (100 kg)] 220 lb 6.4 oz (100 kg) (12/27 [id number])  PHYSICAL EXAM: General:  Middle aged AA male, in NAD. Neuro:  A&O x 3, no deficits. Cardiovascular:  RRR, no M/R/G. Lungs:  Tachypneic.  Bilateral coarse crackles L > R. Abdomen:  Obese. BS x 4.  Soft, NT/ND.  Musculoskeletal:  No deformities, no edema.   IMPRESSION / PLAN:  Acute hypoxic respiratory failure - suspect primarily due to acute pulmonary edema.  CTA negative for PE 12/25.  CXR 12/27 with vascular congestion (received 40mg  lasix with good UOP at that time).  Given his SS state and new hypoxia with tachypnea and fever, certainly concern for acute chest.  Pt already on broad spectrum abx (vanc / cefepime).   Plan: Continue supplemental O2 as needed to maintain SpO2 > 92%. STAT CXR now. 40mg  lasix with 40mEq K. Add levalbuterol nebs PRN.  SS crisis (type [place]). Concern for acute chest  syndrome. Plan: Continue pain control. Defer transfusion for now (Hgb 8.8) given edema - consider in AM. Continue abx.   Rutherford Guysahul Malvin Morrish, [place] - C  Pulmonary & Critical Care Medicine Pgr: [phone]  or [phone] 09/25/2016, 2:12 AM

## 2016-09-25 NOTE — Progress Notes (Signed)
*  PRELIMINARY RESULTS* Vascular Ultrasound Bilateral lower extremity venous duplex has been completed.  Preliminary findings: No evidence of deep vein thrombosis or baker's cysts bilaterally.   Nicolas [name] 09/25/2016, 12:00 PM

## 2016-09-25 NOTE — Consult Note (Signed)
Referral MD  Reason for Referral: Sickle cell crisis with acute chest syndrome   Chief Complaint  Patient presents with  . Sickle Cell Pain Crisis  : My chest hurts and I am hard time breathing  HPI: [name] is a 40 year old African male. He is from the country [place]. He has been in this country for many years. He does go back to [place] every year or so to see his family.  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 He's had hip replacement surgery. He has been followed in ophthalmology for retinal proliferation changes.  He typically gets his [facility].  He was last admitted a year ago. He actually had malaria.  This time, he came in I think on Christmas Day. He had a lot of achiness. He has some shortness of breath. He had no leg swelling. He thinks the weather change probably precipitated this.  He is not on folic acid.  He has never had exchange from what he tells me.  Iron overload is not been a problem for him.  When he was admitted, he had a CT angiogram which was negative for pulmonary embolism. He had Dopplers of his legs done today which were negative. His laboratory came in showed a white cell count 13.4. Hemoglobin 11.6. Platelet count 2 75,000. MCV was 83. His reticulocyte count was 7%.  His bilirubin when he came in was 1.7. His creatinine is 1.18. Potassium 3.3. Blood and urine cultures have been negative. He is on antibiotics. Thereafter he apparently decompensated this morning. He required high-level oxygen saturation. He is on a PCA for pain control.  His situation has not improved. We are subsequently asked to see him try to help with management.  He is quite nice. He has 2 daughters. They have sickle cell trait.  He does not smoke. He really does not drink.  He does not Selleck he has crises all that often.  He does get his immunizations. He last had hepatitis B in May of this year.  He's had Haemophilus in May 2016. He got the Prevnar pneumococcal vaccine in February 2016. He out meningococcal vaccine in February 2016.  He's had no bleeding. His been no change in bowel or bladder habits. He has not coughed up any blood. He is on facemask oxygen.    Past Medical History:  Diagnosis Date  . Arthritis    arthritis- back & L hip  . Depression    situational depression, pt. out of work   . Diabetes mellitus (HCC)   . HTN (hypertension) 05/10/2015  . Osteoarthritis of left hip 06/07/2012  . Sickle cell anemia (HCC)   :  Past Surgical History:  Procedure Laterality Date  . JOINT REPLACEMENT     R hip  . TOTAL HIP ARTHROPLASTY  06/07/2012   Procedure: TOTAL HIP ARTHROPLASTY;  Surgeon: Eulas PostJoshua P Landau, MD;  Location: [facility] OR;  Service: Orthopedics;  Laterality: Left;  :   Current Facility-Administered Medications:  .  acetaminophen (TYLENOL) tablet 650 mg, 650 mg, Oral, Q4H PRN, Roma KayserKatherine P Schorr, NP, 650 mg at 09/24/16 1644 .  carvedilol (COREG) tablet 25 mg, 25 mg, Oral, BID, Alberteen Samhristopher P Danford, MD, 25 mg at 09/25/16 1001 .  ceFEPIme (MAXIPIME) 1 g in dextrose 5 % 50 mL IVPB, 1 g, Intravenous, Q8H, Silvana Newnessndrew D Meyer, RPH, 1 g at 09/25/16 1222 .  diphenhydrAMINE (BENADRYL) injection 12.5 mg, 12.5 mg,  Intravenous, Q6H PRN **OR** diphenhydrAMINE (BENADRYL) 12.5 MG/5ML elixir 12.5 mg, 12.5 mg, Oral, Q6H PRN, Alberteen Samhristopher P Danford, MD .  hydrALAZINE (APRESOLINE) injection 10 mg, 10 mg, Intravenous, Q6H PRN, Ripudeep K Rai, MD .  HYDROmorphone (DILAUDID) 1 mg/mL PCA injection, , Intravenous, Q4H, Christopher P Danford, MD .  insulin aspart (novoLOG) injection 0-15 Units, 0-15 Units, Subcutaneous, TID WC, Alberteen Samhristopher P Danford, MD .  insulin aspart (novoLOG) injection 0-5 Units, 0-5 Units, Subcutaneous, QHS, Alberteen Samhristopher P Danford, MD .  levalbuterol (XOPENEX) nebulizer solution 0.63 mg, 0.63 mg, Nebulization, Q3H PRN, Rahul P Desai, PA-C .  naloxone (NARCAN) injection 0.4 mg,  0.4 mg, Intravenous, PRN **AND** sodium chloride flush (NS) 0.9 % injection 9 mL, 9 mL, Intravenous, PRN, Alberteen Samhristopher P Danford, MD .  ondansetron (ZOFRAN) injection 4 mg, 4 mg, Intravenous, Q6H PRN, Alberteen Samhristopher P Danford, MD .  oxyCODONE-acetaminophen (PERCOCET/ROXICET) 5-325 MG per tablet 1 tablet, 1 tablet, Oral, Q4H PRN **AND** oxyCODONE (Oxy IR/ROXICODONE) immediate release tablet 2.5 mg, 2.5 mg, Oral, Q4H PRN, Belkys A Regalado, MD .  polyethylene glycol (MIRALAX / GLYCOLAX) packet 17 g, 17 g, Oral, Daily PRN, Alberteen Samhristopher P Danford, MD .  senna-docusate (Senokot-S) tablet 1 tablet, 1 tablet, Oral, BID, Alberteen Samhristopher P Danford, MD, 1 tablet at 09/25/16 1000 .  vancomycin (VANCOCIN) IVPB 1000 mg/200 mL premix, 1,000 mg, Intravenous, Q8H, Silvana Newnessndrew D Meyer, RPH, 1,000 mg at 09/25/16 1358 .  white petrolatum (VASELINE) gel, , Topical, PRN, Belkys A Regalado, MD:  . carvedilol  25 mg Oral BID  . ceFEPime (MAXIPIME) IV  1 g Intravenous Q8H  . HYDROmorphone   Intravenous Q4H  . insulin aspart  0-15 Units Subcutaneous TID WC  . insulin aspart  0-5 Units Subcutaneous QHS  . senna-docusate  1 tablet Oral BID  . vancomycin  1,000 mg Intravenous Q8H  :  No Known Allergies:  Family History  Problem Relation Age of Onset  . Cancer - Other Father   . CAD Neg Hx   :  Social History   Social History  . Marital status: Married    Spouse name: N/A  . Number of children: 2  . Years of education: N/A   Occupational History  . Disabled    Social History Main Topics  . Smoking status: Never Smoker  . Smokeless tobacco: Never Used  . Alcohol use 0.5 oz/week    1 Standard drinks or equivalent per week  . Drug use: No  . Sexual activity: Yes   Other Topics Concern  . Not on file   Social History Narrative  . No narrative on file  :  Pertinent items are noted in HPI.  Exam: Patient Vitals for the past 24 hrs:  BP Temp Temp src Pulse Resp SpO2 Weight  09/25/16 1643 131/68 98 F (36.7 C)  Oral 99 (!) 30 93 % -  09/25/16 1335 - - - 93 (!) 29 93 % -  09/25/16 1226 - - - - (!) 36 92 % -  09/25/16 1105 (!) 141/78 98.9 F (37.2 C) Oral 95 (!) 30 98 % -  09/25/16 0720 109/60 (!) 100.6 F (38.1 C) Oral 97 (!) 33 94 % -  09/25/16 0425 - - - - (!) 24 92 % -  09/25/16 0350 132/76 - - 94 (!) 32 95 % 223 lb (101.2 kg)  09/25/16 0223 131/68 (!) 100.4 F (38 C) - 96 (!) 29 95 % -  09/25/16 0130 - - - - (!) 33 92 % -  09/25/16 0001 139/85 99.1 F (37.3 C) Oral 93 (!) 28 96 % -  09/25/16 0000 - - - - (!) 28 95 % -  09/24/16 2300 125/73 99.3 F (37.4 C) - - (!) 23 94 % -  09/24/16 2032 - (!) 100.8 F (38.2 C) Oral - - - -  09/24/16 2000 138/81 - - 95 (!) 22 100 % -  09/24/16 1945 128/78 (!) 101.2 F (38.4 C) Oral 96 (!) 22 96 % -    As above    Recent Labs  09/24/16 2055 09/25/16 0336  WBC 15.1* 14.7*  HGB 8.8* 8.7*  HCT 24.6* 24.5*  PLT 63* 65*    Recent Labs  09/24/16 0425 09/25/16 0336  NA 138 138  K 3.7 3.6  CL 101 103  CO2 30 26  GLUCOSE 140* 125*  BUN 22* 23*  CREATININE 1.30* 1.32*  CALCIUM 8.5* 7.9*    Blood smear review:  None  Pathology: None     Assessment and Plan:  Mr. Wiemers is a 40 year old African-American male. Again he is from [place]. He has hemoglobin [place] disease.  He seems to be developing acute chest syndrome. However, he's been bedbound for the past 3 days. He's not had his Lovenox. He did have the Dopplers done today.  As such, I would not think that he would have a pulmonary embolism.  I worry about him developing acute chest syndrome. As such, I think he really needs to be exchanged. Again is ever had a red cell exchange.  I had thought I put the orders in. Somehow, the orders are nowhere to be found. As such, I will put up with a again.  I'm not sure why he is not on folic acid. 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 I'll put him on 2 mg a day.  He needs incentive spirometry. With his oxygen face mask right now, I don't think  he can do this.  I talked to the patient about this. He agrees to have this done.  I spoke to the dialysis unit. They will be expecting him tomorrow. They can do him tomorrow. I think he will be okay overnight. He has oxygen. He is getting pain medication.  I spent about an hour with him.  This is very complicated. A lot of coordination will need to be done so that he can get the red cell exchange tomorrow.   Christin Bach, MD  Nicolas [name] 7:14

## 2016-09-25 NOTE — Progress Notes (Signed)
eLink Physician-Brief Progress Note Patient Name: Nicolas [name] DOB: 11/26/1975 MRN: [id number]   Date of Service  09/25/2016  HPI/Events of Note  Asked to review cxr by PA - done post lasix but within minutes  eICU Interventions  cxr shows improved aeration     Intervention Category Intermediate Interventions: Diagnostic test evaluation  Delma Villalva 09/25/2016, 3:01 AM

## 2016-09-25 NOTE — Progress Notes (Addendum)
Name: Nicolas [name] MRN: [id number] DOB: 09/07/1976    ADMISSION DATE:  09/22/2016 CONSULTATION DATE:  09/24/2016  REFERRING MD :  Dr. Sunnie Nielsenegalado   CHIEF COMPLAINT:  Hypoxia   Brief 40 yo male admitted with severe knee, lower back and chest pain sickle cell crisis.  Developed fever, hypoxia, and PCCM consulted.  PMH of sickle cell disease (Follows Dr. Pamale[facility]Leedy [facility]), DM, depression, and HTN.  SIGNIFICANT EVENTS  12/25 > Presents ED with Chest, Knee, and Back Pain  12/27 > New Hypoxia and Leukocytosis with CXR showing pulmonary vascular congestion   STUDIES:  CTA > Neg PE, Normal heart and no pericardial effusion  Echo 12/26 > EF 55-60, mild concentric hypertrophy   Cultures:  Blood 12/26 > U/A 12/27 > Neg Urine 12/27 >  RVP 12/27 >   Antibiotics:  Vancomycin 12/27 > Cefepime 12/27 >   SUBJECTIVE:  Remains on aerosol mask. No distress. On PCA   VITAL SIGNS: Temp:  [98.9 F (37.2 C)-101.9 F (38.8 C)] 98.9 F (37.2 C) (12/28 1105) Pulse Rate:  [93-97] 95 (12/28 1105) Resp:  [22-33] 30 (12/28 1105) BP: (109-141)/(60-85) 141/78 (12/28 1105) SpO2:  [90 %-100 %] 98 % (12/28 1105) FiO2 (%):  [45 %] 45 % (12/28 0425) Weight:  [101.2 kg (223 lb)] 101.2 kg (223 lb) (12/28 0350)  PHYSICAL EXAMINATION: General: Adult male, no acute distress   Neuro: alert, oriented, follows commands   HEENT: normocephalic  Cardiovascular: no MRG, Tachy, NI S1/S2 Lungs: unlabored, crackles at bases Abdomen: non-tender, non-distended, active bowel sounds  Musculoskeletal: no deformities  Skin: Warm, dry, intact     Recent Labs Lab 09/23/16 0046 09/24/16 0425 09/25/16 0336  NA 136 138 138  K 3.5 3.7 3.6  CL 99* 101 103  CO2 25 30 26   BUN 21* 22* 23*  CREATININE 1.19 1.30* 1.32*  GLUCOSE 136* 140* 125*    Recent Labs Lab 09/24/16 0425 09/24/16 2055 09/25/16 0336  HGB 10.3* 8.8* 8.7*  HCT 29.5* 24.6* 24.5*  WBC 16.5* 15.1* 14.7*  PLT 68* 63* 65*   Dg Chest 2  View  Result Date: 09/24/2016 CLINICAL DATA:  Sickle cell crisis.  Fever. EXAM: CHEST  2 VIEW COMPARISON:  Chest radiograph and chest CT September 22, 2016 FINDINGS: There is no appreciable edema or consolidation. Heart is mildly enlarged with pulmonary venous hypertension. No adenopathy. 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 There are multiple vertebral body endplate infarcts, stable. IMPRESSION: There is a degree of pulmonary vascular congestion without frank congestive heart failure. No airspace consolidation. Bony changes consistent with sickle cell disease present. Electronically Signed   By: Bretta BangWilliam  Woodruff III M.D.   On: 09/24/2016 14:18   Dg Chest Port 1 View  Result Date: 09/25/2016 CLINICAL DATA:  Acute hypoxemic respiratory failure. EXAM: PORTABLE CHEST 1 VIEW COMPARISON:  Frontal and lateral views yesterday at 1355 hour FINDINGS: Improved lung aeration from prior exam. Cardiomegaly is again seen. Mild vascular congestion. No focal airspace disease. No pleural fluid or pneumothorax. Changes in both shoulders consistent with sickle cell. IMPRESSION: Improved lung aeration.  Cardiomegaly and vascular congestion. Electronically Signed   By: Rubye OaksMelanie  Ehinger M.D.   On: 09/25/2016 02:30    ASSESSMENT / PLAN:  Acute Hypoxic Respiratory Failure with Vascular Congestion  -Maintain oxygen saturation >90 (Currently on 40% Aerosol Mask) -Gentle Diuresis (Careful to prevent dehydration)  -BiPAP if needed / low thresh hold for intubation  -Follow CXR  Sepsis with unknown etiology in  setting of Sickle Cell Crisis - Febrile, leukocytosis, Lactic Acid Neg  -Follow Culture Data -RVP Pending  -Trend Fever and WBC curve -Trend Procal  -Continue Vanc and Cefepime  Thrombocytopenia - LFT down-trending  -Trend CBC - Monitor   Nicolas [name], AG-ACNP [facility]  Pgr: [phone]  PCCM Pgr: [phone]  Feels breathing better.  Pain better  controlled.  HR regular.  No wheeze.  Abd soft.  No edema.  CXR - decreased interstitial edema  Hb 8.7, PLT 65, WBC 14.7, Creatinine 1.32, Procalcitonin 1.69  Assessment/plan:  Acute hypoxic respiratory failure in setting of SS crisis, fever, and interstitial edema. - continue abx - oxygen to keep SpO2 > 92% - even fluid balance - pain control per primary team - goal Hb > 8  Nicolas HellingVineet Luisfelipe Engelstad, MD [facility] 09/25/2016, 12:07 PM Pager:  [phone] After 3pm call: [phone]

## 2016-09-25 NOTE — Progress Notes (Signed)
[facility]                                                                              Patient Demographics  Nicolas [name], is a 40 y.o. male, DOB - 05/16/1976, UVO:[id number]  Admit date - 09/22/2016   Admitting Physician Ripudeep Jenna LuoK Rai, MD  Outpatient Primary MD for the patient is Gwynneth Alimentobyn N Sanders, MD  Outpatient specialists:   LOS - 3  days    Chief Complaint  Patient presents with  . Sickle Cell Pain Crisis       Brief summary  Patient is a 40 y.o. male with  sickle cell disease and NIDDM and HTN presented with severe knee pain, low back pain and chest pain.  The patient was in his usual state of health until several hours before arrival when he had severe onset of knee pain, low back pain, and chest pain. The chest pain is aching, constant, severe, and not typical of his sickle cell. He has no fever, sputum. The knee pain and low back pain are typical of his sickle cell pain, however this did not respond to his home Percocet so he came to the emergency room.  Assessment & Plan   Acute hypoxic respiratory failure; Pulmonary edema vs PNA.  Patient became more  Hypoxic on 12-27  Requiring 8 L oxygen, sat 90 %. Chest x ray with vascular congestion.  Patient with fevers, hypoxemia, history sickle cell.  Received one time IV dose of lasix 12-27 and 28.  CCM consulted.  Continue with IV antibiotics.  Doppler LE negative.  Denies chest pain.     Vaso-occlussive sickle cell pain crisis: -Pain has been improving. Hb trending down, hypoxemia persist. Dr Myna HidalgoEnnever will see patient in consultation  - Continue per sickle cell protocol for pain control.   - Placed on IV hydromorphone PCA  resume  home Percocet (only takes 2-3 per day), IV Toradol contraindicated with troponin leak and AKI -hold IV fluids due to worsening hypoxemia and vascular congestion.  -Diphenhydramine, ondansetron and bowel regimen   Elevated troponin:  - Troponins were trending up hence  cardiology was consulted.  Per cardiology, chest pain is not likely related to myocardial ischemia and likely due to sickle cell crisis. Recommended treating sickle cell crisis and obtain 2-D echo. If echo is normal can consider repeat stress test as outpatient. Cardiology signed off. - D-dimer elevated, as CT angiogram of the chest obtained, negative for pulmonary embolism  -Follow 2-D echo   Sickle cell anemia:  - Baseline Hgb 11, hb trending dow.  -Transfuse as needed if Hg drops   Acute kidney injury:  - Likely due to #1, dehydration, hold HCTZ - placed on IV fluid hydrationcreatinine improving, hold  lisinopril   Elevated LFTs:  Have been elevated in past.  Hep serologies negative in 2016. No previous liver imaging.  No abdominal pain at all. -Trend LFT -denies abdominal pain.   NIDDM:  -Hold home metformin and Januvia -Continue sliding scale insulin  Hypertension:  -hold  amlodipine, carvedilol - restart lisinopril  - Continue to hold HCTZ, triamterene   Code Status: Full CODE STATUS  DVT Prophylaxis:  He has been refusion lovenox, platelet at 68.  Family Communication: Discussed in detail with the patient.    Disposition Plan:  Time Spent in minutes   25 minutes  Procedures:    Consultants:   Cardiology  Antimicrobials:      Medications  Scheduled Meds: . carvedilol  25 mg Oral BID  . ceFEPime (MAXIPIME) IV  1 g Intravenous Q8H  . HYDROmorphone   Intravenous Q4H  . insulin aspart  0-15 Units Subcutaneous TID WC  . insulin aspart  0-5 Units Subcutaneous QHS  . senna-docusate  1 tablet Oral BID  . vancomycin  1,000 mg Intravenous Q8H   Continuous Infusions:  PRN Meds:.   Antibiotics   Anti-infectives    Start     Dose/Rate Route Frequency Ordered Stop   09/24/16 2100  vancomycin (VANCOCIN) IVPB 1000 mg/200 mL premix     1,000 mg 200 mL/hr over 60 Minutes Intravenous Every 8 hours 09/24/16 1132     09/24/16 1230  vancomycin (VANCOCIN)  2,000 mg in sodium chloride 0.9 % 500 mL IVPB     2,000 mg 250 mL/hr over 120 Minutes Intravenous  Once 09/24/16 1132 09/24/16 1622   09/24/16 1230  ceFEPIme (MAXIPIME) 1 g in dextrose 5 % 50 mL IVPB     1 g 100 mL/hr over 30 Minutes Intravenous Every 8 hours 09/24/16 1132          Subjective:   Nicolas [name] he is feeling better, pain decreased to 5/10.  He is breathing better.    Objective:   Vitals:   09/25/16 0223 09/25/16 0350 09/25/16 0425 09/25/16 0720  BP: 131/68 132/76  109/60  Pulse: 96 94  97  Resp: (!) 29 (!) 32 (!) 24 (!) 33  Temp: (!) 100.4 F (38 C)   (!) 100.6 F (38.1 C)  TempSrc:    Oral  SpO2: 95% 95% 92% 94%  Weight:  101.2 kg (223 lb)    Height:        Intake/Output Summary (Last 24 hours) at 09/25/16 1014 Last data filed at 09/25/16 0842  Gross per 24 hour  Intake             1130 ml  Output             2600 ml  Net            -1470 ml     Wt Readings from Last 3 Encounters:  09/25/16 101.2 kg (223 lb)  05/12/15 112 kg (246 lb 15.7 oz)  01/11/15 108.9 kg (240 lb)     Exam  General: Alert and oriented x 3, ill appearing   Cardiovascular: S1 S2 auscultated, no rubs, murmurs or gallops. Regular rate and rhythm.  Respiratory: tachypnea, bilateral crackles, ronchus   Gastrointestinal: Soft, nontender, nondistended, + bowel sounds  Ext: no cyanosis clubbing or edema, knee pain, no edema  Neuro: no new deficits   Skin: No rashes  Psych: Normal affect and demeanor, alert and oriented x3    Data Reviewed:  I have personally reviewed following labs and imaging studies  Micro Results Recent Results (from the past 240 hour(s))  MRSA PCR Screening     Status: None   Collection Time: 09/22/16 11:26 PM  Result Value Ref Range Status   MRSA by PCR NEGATIVE NEGATIVE Final    Comment:        The GeneXpert MRSA Assay (FDA approved for NASAL specimens only), is one component  of a comprehensive MRSA colonization surveillance program.  It is not intended to diagnose MRSA infection nor to guide or monitor treatment for MRSA infections.   Culture, blood (Routine X 2) w Reflex to ID Panel     Status: None (Preliminary result)   Collection Time: 09/23/16 12:46 AM  Result Value Ref Range Status   Specimen Description BLOOD RIGHT HAND  Final   Special Requests IN PEDIATRIC BOTTLE 4CC  Final   Culture NO GROWTH 1 DAY  Final   Report Status PENDING  Incomplete  Culture, blood (Routine X 2) w Reflex to ID Panel     Status: None (Preliminary result)   Collection Time: 09/23/16 12:54 AM  Result Value Ref Range Status   Specimen Description BLOOD LEFT HAND  Final   Special Requests BOTTLES DRAWN AEROBIC AND ANAEROBIC 5CC  Final   Culture NO GROWTH 1 DAY  Final   Report Status PENDING  Incomplete    Radiology Reports Dg Chest 2 View  Result Date: 09/24/2016 CLINICAL DATA:  Sickle cell crisis.  Fever. EXAM: CHEST  2 VIEW COMPARISON:  Chest radiograph and chest CT September 22, 2016 FINDINGS: There is no appreciable edema or consolidation. Heart is mildly enlarged with pulmonary venous hypertension. No adenopathy. 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 There are multiple vertebral body endplate infarcts, stable. IMPRESSION: There is a degree of pulmonary vascular congestion without frank congestive heart failure. No airspace consolidation. Bony changes consistent with sickle cell disease present. Electronically Signed   By: Bretta BangWilliam  Woodruff III M.D.   On: 09/24/2016 14:18   Dg Chest 2 View  Result Date: 09/22/2016 CLINICAL DATA:  Sickle cell pain.  Chest pain. EXAM: CHEST  2 VIEW COMPARISON:  05/10/2015 FINDINGS: Low lung volumes. In combination with habitus this limits assessment. Allowing for this, no evidence of focal airspace disease. Prominent heart size is accentuated by technique. Suspect peribronchial cuffing. No pleural fluid. No pneumothorax. There is degenerative change in the spine. IMPRESSION: Low lung  volumes. Peribronchial cuffing can be seen with bronchitis. No focal airspace disease. Electronically Signed   By: Rubye OaksMelanie  Ehinger M.D.   On: 09/22/2016 05:03   Ct Angio Chest Pe W Or Wo Contrast  Result Date: 09/22/2016 CLINICAL DATA:  Moderate back pain beginning last night, associated BILATERAL leg pain, chest pain, history sickle cell disease, diabetes mellitus, hypertension EXAM: CT ANGIOGRAPHY CHEST WITH CONTRAST TECHNIQUE: Multidetector CT imaging of the chest was performed using the standard protocol during bolus administration of intravenous contrast. Multiplanar CT image reconstructions and MIPs were obtained to evaluate the vascular anatomy. CONTRAST:  100 cc Isovue 370 IV COMPARISON:  None FINDINGS: Cardiovascular: Aorta normal caliber without aneurysm or dissection. Satisfactory opacification of the pulmonary arteries to the segmental level. No evidence of pulmonary embolism. Normal heart size. No pericardial effusion. Mediastinum/Nodes: Normal sized axillary nodes bilaterally. 7 mm short axis RIGHT paratracheal node image 23. No thoracic adenopathy. Esophagus unremarkable. Base of cervical region normal appearance. Lungs/Pleura: Lungs clear.  No pleural effusion or pneumothorax. 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 Otherwise normal appearance. Musculoskeletal: Scattered endplate spur formation thoracic spine. Sclerosis within the T10 vertebral body with minimal central height loss, nonspecific but could be related to sickle cell disease ; this is been present since at least 08/12/2013. Central concavities of multiple vertebral endplates. Review of the MIP images confirms the above findings. IMPRESSION: No evidence of pulmonary embolism. No acute intrathoracic abnormalities. Electronically Signed   By: Angelyn PuntMark  Boles M.D.  On: 09/22/2016 09:36   Dg Chest Port 1 View  Result Date: 09/25/2016 CLINICAL DATA:  Acute hypoxemic respiratory failure. EXAM: PORTABLE CHEST 1 VIEW COMPARISON:   Frontal and lateral views yesterday at 1355 hour FINDINGS: Improved lung aeration from prior exam. Cardiomegaly is again seen. Mild vascular congestion. No focal airspace disease. No pleural fluid or pneumothorax. Changes in both shoulders consistent with sickle cell. IMPRESSION: Improved lung aeration.  Cardiomegaly and vascular congestion. Electronically Signed   By: Rubye Oaks M.D.   On: 09/25/2016 02:30    Lab Data:  CBC:  Recent Labs Lab 09/22/16 0350 09/23/16 0046 09/24/16 0425 09/24/16 2055 09/25/16 0336  WBC 13.4* 15.5* 16.5* 15.1* 14.7*  NEUTROABS 9.0*  --   --  12.0*  --   HGB 11.6* 11.2* 10.3* 8.8* 8.7*  HCT 32.2* 31.1* 29.5* 24.6* 24.5*  MCV 83.2 82.5 84.0 80.7 82.5  PLT 275 123* 68* 63* 65*   Basic Metabolic Panel:  Recent Labs Lab 09/22/16 0350 09/22/16 1251 09/23/16 0046 09/24/16 0425 09/25/16 0336  NA 144 142 136 138 138  K 3.1* 3.3* 3.5 3.7 3.6  CL 107 103 99* 101 103  CO2 24 28 25 30 26   GLUCOSE 113* 115* 136* 140* 125*  BUN 23* 21* 21* 22* 23*  CREATININE 1.60* 1.18 1.19 1.30* 1.32*  CALCIUM 9.8 10.0 9.5 8.5* 7.9*  MG  --  1.9  --   --   --    GFR: Estimated Creatinine Clearance: 82.6 mL/min (by C-G formula based on SCr of 1.32 mg/dL (H)). Liver Function Tests:  Recent Labs Lab 09/22/16 0350 09/22/16 1251 09/24/16 2055  AST 91* 99* 53*  ALT 158* 152* 71*  ALKPHOS 49 54 125  BILITOT 1.2 1.7* 1.6*  PROT 7.4 7.8 6.4*  ALBUMIN 4.6 4.8 3.1*   No results for input(s): LIPASE, AMYLASE in the last 168 hours. No results for input(s): AMMONIA in the last 168 hours. Coagulation Profile: No results for input(s): INR, PROTIME in the last 168 hours. Cardiac Enzymes:  Recent Labs Lab 09/22/16 0350 09/22/16 0612 09/22/16 0652 09/22/16 1251  TROPONINI 0.04* 0.08* 0.09* 0.05*   BNP (last 3 results) No results for input(s): PROBNP in the last 8760 hours. HbA1C:  Recent Labs  09/23/16 0046  HGBA1C <4.2*   CBG:  Recent Labs Lab  09/24/16 0728 09/24/16 1123 09/24/16 1613 09/24/16 1943 09/25/16 0719  GLUCAP 131* 152* 115* 122* 130*   Lipid Profile: No results for input(s): CHOL, HDL, LDLCALC, TRIG, CHOLHDL, LDLDIRECT in the last 72 hours. Thyroid Function Tests: No results for input(s): TSH, T4TOTAL, FREET4, T3FREE, THYROIDAB in the last 72 hours. Anemia Panel: No results for input(s): VITAMINB12, FOLATE, FERRITIN, TIBC, IRON, RETICCTPCT in the last 72 hours. Urine analysis:    Component Value Date/Time   COLORURINE YELLOW 09/24/2016 1203   APPEARANCEUR CLEAR 09/24/2016 1203   LABSPEC 1.013 09/24/2016 1203   PHURINE 5.0 09/24/2016 1203   GLUCOSEU NEGATIVE 09/24/2016 1203   HGBUR NEGATIVE 09/24/2016 1203   BILIRUBINUR NEGATIVE 09/24/2016 1203   KETONESUR NEGATIVE 09/24/2016 1203   PROTEINUR 100 (A) 09/24/2016 1203   UROBILINOGEN 1.0 05/11/2015 0219   NITRITE NEGATIVE 09/24/2016 1203   LEUKOCYTESUR NEGATIVE 09/24/2016 1203     Regalado, Belkys A M.D. [facility] 09/25/2016, 10:14 AM  Pager: [phone] Between 7am to 7pm - call Pager - [phone]  After 7pm go to [email or web address] - password [email or web address]  Call night coverage person covering after 7pm

## 2016-09-25 NOTE — Progress Notes (Signed)
Pt was on venturi mask at 10 L at 45% when arriving on shift and O2 was in the mid 90's. The pt's respiratory status became worse throughout the shift as he became more tachypnic and appeared to have labored breathing. He then began to remove his mask several times and O2 went down into the low 80's each time. When reapplying the mask his O2 would come back up into the low 90's. However, the pt continued to take mask off, became more confused and at one point pulled out both of his IV's. Elink PA was notified of pt's status. He ordered an xray, IV lasix and potassium. Will continue to monitor

## 2016-09-26 ENCOUNTER — Inpatient Hospital Stay (HOSPITAL_COMMUNITY): Payer: Commercial Managed Care - HMO

## 2016-09-26 ENCOUNTER — Encounter (HOSPITAL_COMMUNITY): Payer: Self-pay | Admitting: Radiology

## 2016-09-26 DIAGNOSIS — J189 Pneumonia, unspecified organism: Secondary | ICD-10-CM

## 2016-09-26 DIAGNOSIS — J9601 Acute respiratory failure with hypoxia: Secondary | ICD-10-CM

## 2016-09-26 DIAGNOSIS — D5701 Hb-SS disease with acute chest syndrome: Secondary | ICD-10-CM

## 2016-09-26 DIAGNOSIS — Y95 Nosocomial condition: Secondary | ICD-10-CM

## 2016-09-26 DIAGNOSIS — R509 Fever, unspecified: Secondary | ICD-10-CM

## 2016-09-26 HISTORY — PX: IR GENERIC HISTORICAL: IMG1180011

## 2016-09-26 LAB — CBC
HCT: 21.5 % — ABNORMAL LOW (ref 39.0–52.0)
Hemoglobin: 7.8 g/dL — ABNORMAL LOW (ref 13.0–17.0)
MCH: 28.9 pg (ref 26.0–34.0)
MCHC: 36.3 g/dL — ABNORMAL HIGH (ref 30.0–36.0)
MCV: 79.6 fL (ref 78.0–100.0)
Platelets: 69 10*3/uL — ABNORMAL LOW (ref 150–400)
RBC: 2.7 MIL/uL — ABNORMAL LOW (ref 4.22–5.81)
RDW: 16.9 % — ABNORMAL HIGH (ref 11.5–15.5)
WBC: 12.2 10*3/uL — ABNORMAL HIGH (ref 4.0–10.5)

## 2016-09-26 LAB — COMPREHENSIVE METABOLIC PANEL
ALT: 71 U/L — ABNORMAL HIGH (ref 17–63)
AST: 55 U/L — ABNORMAL HIGH (ref 15–41)
Albumin: 2.9 g/dL — ABNORMAL LOW (ref 3.5–5.0)
Alkaline Phosphatase: 112 U/L (ref 38–126)
Anion gap: 8 (ref 5–15)
BUN: 19 mg/dL (ref 6–20)
CO2: 26 mmol/L (ref 22–32)
Calcium: 8 mg/dL — ABNORMAL LOW (ref 8.9–10.3)
Chloride: 103 mmol/L (ref 101–111)
Creatinine, Ser: 1.21 mg/dL (ref 0.61–1.24)
GFR calc Af Amer: >60 mL/min (ref >60)
GFR calc non Af Amer: >60 mL/min (ref >60)
Glucose, Bld: 199 mg/dL — ABNORMAL HIGH (ref 65–99)
Potassium: 3.5 mmol/L (ref 3.5–5.1)
Sodium: 137 mmol/L (ref 135–145)
Total Bilirubin: 1.4 mg/dL — ABNORMAL HIGH (ref 0.3–1.2)
Total Protein: 6.2 g/dL — ABNORMAL LOW (ref 6.5–8.1)

## 2016-09-26 LAB — GLUCOSE, CAPILLARY
Glucose-Capillary: 138 mg/dL — ABNORMAL HIGH (ref 65–99)
Glucose-Capillary: 109 mg/dL — ABNORMAL HIGH (ref 65–99)
Glucose-Capillary: 158 mg/dL — ABNORMAL HIGH (ref 65–99)
Glucose-Capillary: 171 mg/dL — ABNORMAL HIGH (ref 65–99)

## 2016-09-26 LAB — LACTATE DEHYDROGENASE: LDH: 871 U/L — ABNORMAL HIGH (ref 98–192)

## 2016-09-26 LAB — RETICULOCYTES
RBC.: 2.7 MIL/uL — ABNORMAL LOW (ref 4.22–5.81)
Retic Count, Absolute: 105.3 10*3/uL (ref 19.0–186.0)
Retic Ct Pct: 3.9 % — ABNORMAL HIGH (ref 0.4–3.1)

## 2016-09-26 LAB — VANCOMYCIN, TROUGH: Vancomycin Tr: 17 ug/mL (ref 15–20)

## 2016-09-26 LAB — PROCALCITONIN: Procalcitonin: 0.81 ng/mL

## 2016-09-26 MED ORDER — CALCIUM CARBONATE ANTACID 500 MG PO CHEW: 2.0000 | CHEWABLE_TABLET | ORAL | Status: DC

## 2016-09-26 MED ORDER — ACD FORMULA A 0.73-2.45-2.2 GM/100ML VI SOLN
500.0000 mL | Status: DC
  Filled 2016-09-26: qty 500

## 2016-09-26 MED ORDER — FOLIC ACID 1 MG PO TABS: 2.0000 mg | ORAL_TABLET | Freq: Every day | ORAL | Status: DC

## 2016-09-26 MED ORDER — SODIUM CHLORIDE 0.9 % IV SOLN: 4.0000 g | Freq: Once | INTRAVENOUS | Status: DC

## 2016-09-26 MED ORDER — DIPHENHYDRAMINE HCL 25 MG PO CAPS
25.0000 mg | ORAL_CAPSULE | Freq: Four times a day (QID) | ORAL | Status: DC | PRN
Start: 2016-09-26 — End: 2016-09-26

## 2016-09-26 MED ORDER — LIDOCAINE HCL (PF) 1 % IJ SOLN
INTRAMUSCULAR | Status: AC
  Filled 2016-09-26: qty 10

## 2016-09-26 MED ORDER — IOPAMIDOL (ISOVUE-370) INJECTION 76%
INTRAVENOUS | Status: AC
  Administered 2016-09-26: 100 mL
  Filled 2016-09-26: qty 100

## 2016-09-26 MED ORDER — ACETAMINOPHEN 325 MG PO TABS: 650.0000 mg | ORAL_TABLET | ORAL | Status: DC | PRN

## 2016-09-26 MED ORDER — HEPARIN SODIUM (PORCINE) 1000 UNIT/ML IJ SOLN
1000.0000 [IU] | Freq: Once | INTRAMUSCULAR | Status: DC
  Filled 2016-09-26: qty 1

## 2016-09-26 MED ORDER — LIDOCAINE HCL (PF) 1 % IJ SOLN
INTRAMUSCULAR | Status: DC | PRN
  Administered 2016-09-26: 5 mL

## 2016-09-26 MED ORDER — ACD FORMULA A 0.73-2.45-2.2 GM/100ML VI SOLN
Status: AC
  Administered 2016-09-26: 500 mL via INTRAVENOUS
  Filled 2016-09-26: qty 500

## 2016-09-26 MED ORDER — HEPARIN SODIUM (PORCINE) 1000 UNIT/ML IJ SOLN
INTRAMUSCULAR | Status: AC
  Filled 2016-09-26: qty 1

## 2016-09-26 MED ORDER — CALCIUM CARBONATE ANTACID 500 MG PO CHEW
CHEWABLE_TABLET | ORAL | Status: AC
Start: 2016-09-26 — End: 2016-09-27
  Filled 2016-09-26: qty 4

## 2016-09-26 MED ORDER — DIPHENHYDRAMINE HCL 25 MG PO CAPS: 25.0000 mg | ORAL_CAPSULE | Freq: Four times a day (QID) | ORAL | Status: DC | PRN

## 2016-09-26 MED ORDER — CALCIUM CARBONATE ANTACID 500 MG PO CHEW
2.0000 | CHEWABLE_TABLET | ORAL | Status: DC
  Administered 2016-09-26: 400 mg via ORAL
  Filled 2016-09-26: qty 2

## 2016-09-26 MED ORDER — SODIUM CHLORIDE 0.9 % IV SOLN
4.0000 g | Freq: Once | INTRAVENOUS | Status: AC
  Administered 2016-09-26: 4 g via INTRAVENOUS
  Filled 2016-09-26 (×2): qty 40

## 2016-09-26 MED ORDER — ACETAMINOPHEN 325 MG PO TABS
650.0000 mg | ORAL_TABLET | ORAL | Status: DC | PRN
  Filled 2016-09-26: qty 2

## 2016-09-26 MED ORDER — HYDROMORPHONE HCL 1 MG/ML IJ SOLN: 1.0000 mg | INTRAMUSCULAR | Status: DC | PRN

## 2016-09-26 MED ORDER — ACD FORMULA A 0.73-2.45-2.2 GM/100ML VI SOLN
500.0000 mL | Status: DC
  Administered 2016-09-26: 500 mL via INTRAVENOUS

## 2016-09-26 NOTE — Progress Notes (Signed)
Name: Nicolas [name] Certain MRN: [id number] DOB: 09/01/1976    ADMISSION DATE:  09/22/2016 CONSULTATION DATE:  09/24/2016  REFERRING MD :  Dr. Sunnie Nielsenegalado   CHIEF COMPLAINT:  Hypoxia   Brief 40 yo male admitted with severe knee, lower back and chest pain sickle cell crisis.  Developed fever, hypoxia, and PCCM consulted.  PMH of sickle cell disease ([facility]), DM, depression, and HTN.  SIGNIFICANT EVENTS  12/25 > Presents ED with Chest, Knee, and Back Pain  12/27 > New Hypoxia and Leukocytosis with CXR showing pulmonary vascular congestion  12/29 > VasCath placed for red cell exchange   STUDIES:  CTA 12/25 > Neg PE, Normal heart and no pericardial effusion  Echo 12/26 > EF 55-60, mild concentric hypertrophy  BLE dopplers 12/28 > Neg DVT  CTA 12/29 > b/l patchy ASD  Cultures:  Blood 12/26 > Urine 12/27 > Neg  RVP 12/27 > Neg   Antibiotics:  Vancomycin 12/27 > Cefepime 12/27 >   SUBJECTIVE:  Remains on aerosol mask. Planned red cell exchange today   VITAL SIGNS: Temp:  [98 F (36.7 C)-103.2 F (39.6 C)] 101 F (38.3 C) (12/29 0753) Pulse Rate:  [88-104] 98 (12/29 0753) Resp:  [15-36] 15 (12/29 0800) BP: (105-145)/(56-91) 141/91 (12/29 0753) SpO2:  [41 %-100 %] 41 % (12/29 0800) FiO2 (%):  [40 %] 40 % (12/28 1335) Weight:  [101.9 kg (224 lb 11.2 oz)] 101.9 kg (224 lb 11.2 oz) (12/29 0500)  PHYSICAL EXAMINATION: General: Adult male, no acute distress   Neuro: alert, follows commands, oriented x 4  HEENT: normocephalic  Cardiovascular: no MRG, Tachy, NI S1/S2 Lungs: unlabored, crackles at bases Abdomen: non-tender, non-distended, active bowel sounds  Musculoskeletal: no deformities  Skin: Warm, dry, intact     Recent Labs Lab 09/24/16 0425 09/25/16 0336 09/26/16 0546  NA 138 138 137  K 3.7 3.6 3.5  CL 101 103 103  CO2 30 26 26   BUN 22* 23* 19  CREATININE 1.30* 1.32* 1.21  GLUCOSE 140* 125* 199*    Recent Labs Lab 09/24/16 2055  09/25/16 0336 09/26/16 0546  HGB 8.8* 8.7* 7.8*  HCT 24.6* 24.5* 21.5*  WBC 15.1* 14.7* 12.2*  PLT 63* 65* 69*   Dg Chest 2 View  Result Date: 09/24/2016 CLINICAL DATA:  Sickle cell crisis.  Fever. EXAM: CHEST  2 VIEW COMPARISON:  Chest radiograph and chest CT September 22, 2016 FINDINGS: There is no appreciable edema or consolidation. Heart is mildly enlarged with pulmonary venous hypertension. No adenopathy. 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 There are multiple vertebral body endplate infarcts, stable. IMPRESSION: There is a degree of pulmonary vascular congestion without frank congestive heart failure. No airspace consolidation. Bony changes consistent with sickle cell disease present. Electronically Signed   By: Bretta BangWilliam  Woodruff III M.D.   On: 09/24/2016 14:18   Ct Angio Chest Pe W Or Wo Contrast  Result Date: 09/26/2016 CLINICAL DATA:  Acute chest syndrome.  Sickle cell crisis. EXAM: CT ANGIOGRAPHY CHEST WITH CONTRAST TECHNIQUE: Multidetector CT imaging of the chest was performed using the standard protocol during bolus administration of intravenous contrast. Multiplanar CT image reconstructions and MIPs were obtained to evaluate the vascular anatomy. CONTRAST:  100 cc Isovue 370 IV COMPARISON:  09/22/2016 FINDINGS: Cardiovascular: No filling defects in the pulmonary arteries to suggest pulmonary emboli. Heart is mildly enlarged. Aorta is normal caliber. Mediastinum/Nodes: No mediastinal, hilar, or axillary adenopathy. Lungs/Pleura: Diffuse bilateral airspace opacities, most confluent in the  lower lobes. Favor edema. Cannot exclude pneumonia. No pleural effusions. 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 No acute findings in the upper abdomen. Musculoskeletal: Chest wall soft tissues are unremarkable. No acute bony abnormality. Sclerotic lower thoracic and upper lumbar vertebral bodies compatible with sickle cell changes. Review of the MIP images confirms  the above findings. IMPRESSION: No evidence of pulmonary embolus. Cardiomegaly. Diffuse bilateral airspace opacities, edema versus pneumonia. Electronically Signed   By: Charlett NoseKevin  Dover M.D.   On: 09/26/2016 10:00   Dg Chest Port 1 View  Result Date: 09/25/2016 CLINICAL DATA:  Acute hypoxemic respiratory failure. EXAM: PORTABLE CHEST 1 VIEW COMPARISON:  Frontal and lateral views yesterday at 1355 hour FINDINGS: Improved lung aeration from prior exam. Cardiomegaly is again seen. Mild vascular congestion. No focal airspace disease. No pleural fluid or pneumothorax. Changes in both shoulders consistent with sickle cell. IMPRESSION: Improved lung aeration.  Cardiomegaly and vascular congestion. Electronically Signed   By: Rubye OaksMelanie  Ehinger M.D.   On: 09/25/2016 02:30    ASSESSMENT / PLAN:  Acute Hypoxic Respiratory Failure with patchy b/l ASD on CT chest >> pneumonia versus acute chest syndrome. - oxygen to keep SpO2 > 93% - prn Bipap - f/u CXR - exchange transfusion per hematology - Abx per primary team  Sickle cell pain crisis with hemoglobin [place]. - pain control per primary team  Jovita KussmaulKatalina Eubanks, AG-ACNP [facility]  Pgr: [phone]  PCCM Pgr: [phone]  Had catheter placed to set up exchange transfusion per hematology.  Breathing stable.  No wheeze.  HR regular.  Abd soft.  Assessment/plan:  Acute hypoxic respiratory failure. - oxygen to keep SpO2 > 93%  Sickle cell crisis. - exchange transfusion per hematology  Pneumonia. - Abx per primary team  Coralyn HellingVineet Waunetta Riggle, MD [facility] 09/26/2016, 1:40 PM Pager:  [phone] After 3pm call: [phone]

## 2016-09-26 NOTE — Progress Notes (Signed)
Pt began to desat in the 70's after taking mask off for an undetermined amount of time. Mask replaced but pt unable to recover saturations. Pt placed on non-rebreather mask. Sats now maintaining in the 90's. Will continue to monitor.

## 2016-09-26 NOTE — Progress Notes (Signed)
[name] still has a Psychiatric nurserebreather on. He was doing well in the evening. He then desaturated. His oxygen saturations morning is 96%.  He will be setup for a red cell exchange. I think this is necessary. I do worry about the acute chest syndrome. The orders are in. I talked him about this. He will need a dialysis catheter. Interventional radiology will place this for us. Hopefully, he can have this done late morning or early afternoon. I went down to the blood bank and talk to the blood bank technician. She will get the blood ready.  He did have a little bit of a temperature last night. So far, his cultures are negative. I just find it very interesting that he had malaria last year.  His labs this morning show a hemoglobin of 7.8. I'm sure that he is hemolyzing from the sickle crisis. His white cell count and platelet count are pending.  This morning his temperature is 100.1. His maximum temperature is a was 103.2. His blood pressures doing okay. Heart rate is 88.  He is on vancomycin and Maxipime.  Again, the Red cell exchange should be able to help with his pulmonary situation.  He is on folic acid now.  I suppose that checking for a pulmonary embolism might not be a bad idea. I know he had a negative Doppler yesterday. His renal function has to be watched closely if a another CT angiogram is done., I am not sure if a VQ scan would give us the information that we need.  His total bilirubin is only 1.4 so I would not think that he would be hemolyzing all that much.  We will continue to follow him closely.  Christin BachPete Armend Hochstatter, MD  Molli HazardMatthew 1:37

## 2016-09-26 NOTE — Progress Notes (Signed)
Pharmacy Antibiotic Note  Nicolas [name] is a 40 y.o. male here with with sickle cell crisis and now with possible sepsis.  All cultures ngtd, however pt is still febrile and requiring non-rebreather, therefore planning to continue vancomycin and cefepime at this time. Vancomycin trough drawn today was therapeutic at 17.  Plan: -Vancomycin 1000mg  IV q8h -Cefepime 1gm IV q8h -Will follow renal function, cultures -Vancomycin trough as needed   Height: 5\' 7"  (170.2 cm) Weight: 224 lb 10.4 oz (101.9 kg) IBW/kg (Calculated) : 66.1  Temp (24hrs), Avg:100.5 F (38.1 C), Min:98 F (36.7 C), Max:103.2 F (39.6 C)   Recent Labs Lab 09/22/16 1251 09/23/16 0046 09/24/16 0425 09/24/16 1753 09/24/16 2055 09/25/16 0336 09/26/16 0546 09/26/16 1201  WBC  --  15.5* 16.5*  --  15.1* 14.7* 12.2*  --   CREATININE 1.18 1.19 1.30*  --   --  1.32* 1.21  --   LATICACIDVEN  --   --   --  1.5 0.7  --   --   --   VANCOTROUGH  --   --   --   --   --   --   --  17    Estimated Creatinine Clearance: 90.4 mL/min (by C-G formula based on SCr of 1.21 mg/dL).     Antimicrobials this admission: 12/27 cefepime 12/27 vanc  Dose adjustments this admission: 12/29 VT: 17  Microbiology results: 12/25 MRSA PCR- neg 12/26 blood x2: ngtd     Nicolas [name], PharmD, BCPS Clinical Pharmacist 09/26/2016 1:42 PM

## 2016-09-26 NOTE — Progress Notes (Signed)
[facility]                                                                              Patient Demographics  Nicolas [name], is a 40 y.o. male, DOB - Oct 19, 1975, ZOX:[id number]  Admit date - 09/22/2016   Admitting Physician [name], MD  Outpatient Primary MD for the patient is [name], MD  Outpatient specialists:   LOS - 4  days    Chief Complaint  Patient presents with  . Sickle Cell Pain Crisis       Brief summary  Patient is a 40 y.o. male with  sickle cell disease and NIDDM and HTN presented with severe knee pain, low back pain and chest pain.  The patient was in his usual state of health until several hours before arrival when he had severe onset of knee pain, low back pain, and chest pain. The chest pain is aching, constant, severe, and not typical of his sickle cell. He has no fever, sputum. The knee pain and low back pain are typical of his sickle cell pain, however this did not respond to his home Percocet so he came to the emergency room.  Patient admitted with sickle cell crisis, subsequently he spike fever, and develops acute hypoxic respiratory failure. He was started on braod spectrum antibiotics, received IV lasix, and CCM consulted. His hemoglobin started to decrease. Concern for acute chest syndrome. Hematology consulted. Plan is for exchange transfusion today.    Assessment & Plan   Acute hypoxic respiratory failure; Pulmonary edema vs PNA.  Patient became more  Hypoxic on 12-27  Requiring 8 L oxygen, sat 90 %. Chest x ray with vascular congestion.  Patient with fevers, hypoxemia, history sickle cell.  Received  IV dose of lasix 12-27 and 28.  CCM consulted and following. Patient still on significant amount of Oxygen.  Continue with IV antibiotics.  Doppler LE negative.  Repeated CT angio with pna vs pulmonary edema. Negative for PE.  Plan to proceed with exchange transfusion.     Vaso-occlussive sickle cell pain crisis:  Sickle cell anemia:  -Pain has been improving. Hb trending down, hypoxemia persist. Dr Myna Hidalgo consulted. Appreciate Dr Myna Hidalgo help. Plan for exchange transfusion today.  - Continue per sickle cell protocol for pain control.   - Change V hydromorphone PCA to IV dilaudid.  -resume  home Percocet (only takes 2-3 per day), IV Toradol contraindicated with troponin leak and AKI -hold IV fluids due to worsening hypoxemia and vascular congestion.  -Diphenhydramine, ondansetron and bowel regimen   Elevated troponin:  - Troponins were trending up hence cardiology was consulted.  Per cardiology, chest pain is not likely related to myocardial ischemia and likely due to sickle cell crisis. Recommended treating sickle cell crisis and obtain 2-D echo. If echo is normal can consider repeat stress test as outpatient. Cardiology signed off. - D-dimer elevated, as CT angiogram of the chest obtained, negative for pulmonary embolism  -2-D echo normal ef.    Acute kidney injury:  - Likely due to #1, dehydration, hold HCTZ -stable   Elevated LFTs:  Have been elevated in past.  Hep serologies negative in 2016. No previous liver imaging.  No abdominal pain at all. -Trend LFT -denies abdominal pain.   NIDDM:  -Hold home metformin and Januvia -Continue sliding scale insulin  Hypertension:  -hold  amlodipine, on carvedilol - Continue to hold HCTZ, triamterene   Code Status: Full CODE STATUS DVT Prophylaxis:  He has been refusion lovenox, platelet at 68. SCD Family Communication: Discussed in detail with the patient.    Disposition Plan:  Time Spent in minutes   25 minutes  Procedures:    Consultants:   Cardiology  Antimicrobials:   Vancomycin   Cefepime    Medications  Scheduled Meds: . calcium gluconate IVPB  4 g Intravenous Once  . carvedilol  25 mg Oral BID  . ceFEPime (MAXIPIME) IV  1 g Intravenous Q8H  . citrate dextrose      . folic acid  2 mg Oral Daily  . heparin        . heparin  1,000 Units Intracatheter Once  . heparin  1,000 Units Intracatheter Once  . insulin aspart  0-15 Units Subcutaneous TID WC  . insulin aspart  0-5 Units Subcutaneous QHS  . lidocaine (PF)      . senna-docusate  1 tablet Oral BID  . vancomycin  1,000 mg Intravenous Q8H   Continuous Infusions: . citrate dextrose     PRN Meds:.   Antibiotics   Anti-infectives    Start     Dose/Rate Route Frequency Ordered Stop   09/24/16 2100  vancomycin (VANCOCIN) IVPB 1000 mg/200 mL premix     1,000 mg 200 mL/hr over 60 Minutes Intravenous Every 8 hours 09/24/16 1132     09/24/16 1230  vancomycin (VANCOCIN) 2,000 mg in sodium chloride 0.9 % 500 mL IVPB     2,000 mg 250 mL/hr over 120 Minutes Intravenous  Once 09/24/16 1132 09/24/16 1622   09/24/16 1230  ceFEPIme (MAXIPIME) 1 g in dextrose 5 % 50 mL IVPB     1 g 100 mL/hr over 30 Minutes Intravenous Every 8 hours 09/24/16 1132          Subjective:   [name] he is feeling better, he doesn't think he needs PCA pump.  He is breathing better.    Objective:   Vitals:   09/26/16 0500 09/26/16 0753 09/26/16 0800 09/26/16 1111  BP:  (!) 141/91  (!) 162/96  Pulse:  98  (!) 102  Resp:  (!) 29 15 (!) 22  Temp:  (!) 101 F (38.3 C)  (!) 101.5 F (38.6 C)  TempSrc:  Oral  Oral  SpO2:  97% (!) 41% 94%  Weight: 101.9 kg (224 lb 11.2 oz)     Height:        Intake/Output Summary (Last 24 hours) at 09/26/16 1319 Last data filed at 09/26/16 1200  Gross per 24 hour  Intake              480 ml  Output             1301 ml  Net             -821 ml     Wt Readings from Last 3 Encounters:  09/26/16 101.9 kg (224 lb 11.2 oz)  05/12/15 112 kg (246 lb 15.7 oz)  01/11/15 108.9 kg (240 lb)     Exam  General: Alert and oriented x 3, ill appearing   Cardiovascular: S1 S2 auscultated, no rubs, murmurs or gallops. Regular rate and  rhythm.  Respiratory: tachypnea, bilateral crackles, ronchus   Gastrointestinal: Soft,  nontender, nondistended, + bowel sounds  Ext: no cyanosis clubbing or edema, knee pain, no edema  Neuro: no new deficits   Skin: No rashes  Psych: Normal affect and demeanor, alert and oriented x3    Data Reviewed:  I have personally reviewed following labs and imaging studies  Micro Results Recent Results (from the past 240 hour(s))  MRSA PCR Screening     Status: None   Collection Time: 09/22/16 11:26 PM  Result Value Ref Range Status   MRSA by PCR NEGATIVE NEGATIVE Final    Comment:        The GeneXpert MRSA Assay (FDA approved for NASAL specimens only), is one component of a comprehensive MRSA colonization surveillance program. It is not intended to diagnose MRSA infection nor to guide or monitor treatment for MRSA infections.   Culture, blood (Routine X 2) w Reflex to ID Panel     Status: None (Preliminary result)   Collection Time: 09/23/16 12:46 AM  Result Value Ref Range Status   Specimen Description BLOOD RIGHT HAND  Final   Special Requests IN PEDIATRIC BOTTLE 4CC  Final   Culture NO GROWTH 2 DAYS  Final   Report Status PENDING  Incomplete  Culture, blood (Routine X 2) w Reflex to ID Panel     Status: None (Preliminary result)   Collection Time: 09/23/16 12:54 AM  Result Value Ref Range Status   Specimen Description BLOOD LEFT HAND  Final   Special Requests BOTTLES DRAWN AEROBIC AND ANAEROBIC 5CC  Final   Culture NO GROWTH 2 DAYS  Final   Report Status PENDING  Incomplete  Urine culture     Status: None   Collection Time: 09/24/16 12:03 PM  Result Value Ref Range Status   Specimen Description URINE, CLEAN CATCH  Final   Special Requests NONE  Final   Culture NO GROWTH  Final   Report Status 09/25/2016 FINAL  Final  Respiratory Panel by PCR     Status: None   Collection Time: 09/24/16 10:47 PM  Result Value Ref Range Status   Adenovirus NOT DETECTED NOT DETECTED Final   Coronavirus 229E NOT DETECTED NOT DETECTED Final   Coronavirus HKU1 NOT DETECTED  NOT DETECTED Final   Coronavirus NL63 NOT DETECTED NOT DETECTED Final   Coronavirus OC43 NOT DETECTED NOT DETECTED Final   Metapneumovirus NOT DETECTED NOT DETECTED Final   Rhinovirus / Enterovirus NOT DETECTED NOT DETECTED Final   Influenza A NOT DETECTED NOT DETECTED Final   Influenza B NOT DETECTED NOT DETECTED Final   Parainfluenza Virus 1 NOT DETECTED NOT DETECTED Final   Parainfluenza Virus 2 NOT DETECTED NOT DETECTED Final   Parainfluenza Virus 3 NOT DETECTED NOT DETECTED Final   Parainfluenza Virus 4 NOT DETECTED NOT DETECTED Final   Respiratory Syncytial Virus NOT DETECTED NOT DETECTED Final   Bordetella pertussis NOT DETECTED NOT DETECTED Final   Chlamydophila pneumoniae NOT DETECTED NOT DETECTED Final   Mycoplasma pneumoniae NOT DETECTED NOT DETECTED Final    Radiology Reports Dg Chest 2 View  Result Date: 09/24/2016 CLINICAL DATA:  Sickle cell crisis.  Fever. EXAM: CHEST  2 VIEW COMPARISON:  Chest radiograph and chest CT September 22, 2016 FINDINGS: There is no appreciable edema or consolidation. Heart is mildly enlarged with pulmonary venous hypertension. No adenopathy. 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 There are multiple vertebral body endplate infarcts, stable. IMPRESSION: There is a degree of  pulmonary vascular congestion without frank congestive heart failure. No airspace consolidation. Bony changes consistent with sickle cell disease present. Electronically Signed   By: Bretta Bang III M.D.   On: 09/24/2016 14:18   Dg Chest 2 View  Result Date: 09/22/2016 CLINICAL DATA:  Sickle cell pain.  Chest pain. EXAM: CHEST  2 VIEW COMPARISON:  05/10/2015 FINDINGS: Low lung volumes. In combination with habitus this limits assessment. Allowing for this, no evidence of focal airspace disease. Prominent heart size is accentuated by technique. Suspect peribronchial cuffing. No pleural fluid. No pneumothorax. There is degenerative change in the spine. IMPRESSION: Low  lung volumes. Peribronchial cuffing can be seen with bronchitis. No focal airspace disease. Electronically Signed   By: Rubye Oaks M.D.   On: 09/22/2016 05:03   Ct Angio Chest Pe W Or Wo Contrast  Result Date: 09/26/2016 CLINICAL DATA:  Acute chest syndrome.  Sickle cell crisis. EXAM: CT ANGIOGRAPHY CHEST WITH CONTRAST TECHNIQUE: Multidetector CT imaging of the chest was performed using the standard protocol during bolus administration of intravenous contrast. Multiplanar CT image reconstructions and MIPs were obtained to evaluate the vascular anatomy. CONTRAST:  100 cc Isovue 370 IV COMPARISON:  09/22/2016 FINDINGS: Cardiovascular: No filling defects in the pulmonary arteries to suggest pulmonary emboli. Heart is mildly enlarged. Aorta is normal caliber. Mediastinum/Nodes: No mediastinal, hilar, or axillary adenopathy. Lungs/Pleura: Diffuse bilateral airspace opacities, most confluent in the lower lobes. Favor edema. Cannot exclude pneumonia. No pleural effusions. 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 No acute findings in the upper abdomen. Musculoskeletal: Chest wall soft tissues are unremarkable. No acute bony abnormality. Sclerotic lower thoracic and upper lumbar vertebral bodies compatible with sickle cell changes. Review of the MIP images confirms the above findings. IMPRESSION: No evidence of pulmonary embolus. Cardiomegaly. Diffuse bilateral airspace opacities, edema versus pneumonia. Electronically Signed   By: Charlett Nose M.D.   On: 09/26/2016 10:00   Ct Angio Chest Pe W Or Wo Contrast  Result Date: 09/22/2016 CLINICAL DATA:  Moderate back pain beginning last night, associated BILATERAL leg pain, chest pain, history sickle cell disease, diabetes mellitus, hypertension EXAM: CT ANGIOGRAPHY CHEST WITH CONTRAST TECHNIQUE: Multidetector CT imaging of the chest was performed using the standard protocol during bolus administration of intravenous contrast. Multiplanar  CT image reconstructions and MIPs were obtained to evaluate the vascular anatomy. CONTRAST:  100 cc Isovue 370 IV COMPARISON:  None FINDINGS: Cardiovascular: Aorta normal caliber without aneurysm or dissection. Satisfactory opacification of the pulmonary arteries to the segmental level. No evidence of pulmonary embolism. Normal heart size. No pericardial effusion. Mediastinum/Nodes: Normal sized axillary nodes bilaterally. 7 mm short axis RIGHT paratracheal node image 23. No thoracic adenopathy. Esophagus unremarkable. Base of cervical region normal appearance. Lungs/Pleura: Lungs clear.  No pleural effusion or pneumothorax. 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 Otherwise normal appearance. Musculoskeletal: Scattered endplate spur formation thoracic spine. Sclerosis within the T10 vertebral body with minimal central height loss, nonspecific but could be related to sickle cell disease ; this is been present since at least 08/12/2013. Central concavities of multiple vertebral endplates. Review of the MIP images confirms the above findings. IMPRESSION: No evidence of pulmonary embolism. No acute intrathoracic abnormalities. Electronically Signed   By: Ulyses Southward M.D.   On: 09/22/2016 09:36   Ir Fluoro Guide Cv Line Right  Result Date: 09/26/2016 CLINICAL DATA:  Renal failure and need for non tunneled hemodialysis catheter placement. EXAM: NON-TUNNELED CENTRAL VENOUS CATHETER PLACEMENT WITH ULTRASOUND AND FLUOROSCOPIC  GUIDANCE FLUOROSCOPY TIME:  6 seconds.  4 mGy. PROCEDURE: The procedure, risks, benefits, and alternatives were explained to the patient. Questions regarding the procedure were encouraged and answered. The patient understands and consents to the procedure. A time-out was performed prior to the procedure. Ultrasound was used to confirm patency of the right internal jugular vein. The right neck was prepped with chlorhexidine in a sterile fashion, and a sterile drape was applied covering the  operative field. Maximum barrier sterile technique with sterile gowns and gloves were used for the procedure. Local anesthesia was provided with 1% lidocaine. After creating a small venotomy incision, a 19 gauge needle was advanced into the right internal jugular vein under direct, real-time ultrasound guidance. Ultrasound image documentation was performed. After securing guidewire access, the venotomy was dilated. The 20 cm length, 13 French Trialysis catheter was then advanced over the wire. Catheter positioning was confirmed by fluoroscopy. The catheter was aspirated, flushed with saline, and injected with appropriate volume heparin dwells. The catheter exit site was secured with 0-Prolene retention sutures. COMPLICATIONS: None.  No pneumothorax. FINDINGS: After catheter placement, the tip lies in the right atrium. The catheter aspirates normally and is ready for immediate use. IMPRESSION: Placement of non-tunneled hemodialysis catheter via right internal jugular vein. The catheter tip lies in the right atrium. The catheter is ready for immediate use. Electronically Signed   By: Irish LackGlenn  Yamagata M.D.   On: 09/26/2016 11:04   Ir [place] Guide Vasc Access Right  Result Date: 09/26/2016 CLINICAL DATA:  Renal failure and need for non tunneled hemodialysis catheter placement. EXAM: NON-TUNNELED CENTRAL VENOUS CATHETER PLACEMENT WITH ULTRASOUND AND FLUOROSCOPIC GUIDANCE FLUOROSCOPY TIME:  6 seconds.  4 mGy. PROCEDURE: The procedure, risks, benefits, and alternatives were explained to the patient. Questions regarding the procedure were encouraged and answered. The patient understands and consents to the procedure. A time-out was performed prior to the procedure. Ultrasound was used to confirm patency of the right internal jugular vein. The right neck was prepped with chlorhexidine in a sterile fashion, and a sterile drape was applied covering the operative field. Maximum barrier sterile technique with sterile gowns and  gloves were used for the procedure. Local anesthesia was provided with 1% lidocaine. After creating a small venotomy incision, a 19 gauge needle was advanced into the right internal jugular vein under direct, real-time ultrasound guidance. Ultrasound image documentation was performed. After securing guidewire access, the venotomy was dilated. The 20 cm length, 13 French Trialysis catheter was then advanced over the wire. Catheter positioning was confirmed by fluoroscopy. The catheter was aspirated, flushed with saline, and injected with appropriate volume heparin dwells. The catheter exit site was secured with 0-Prolene retention sutures. COMPLICATIONS: None.  No pneumothorax. FINDINGS: After catheter placement, the tip lies in the right atrium. The catheter aspirates normally and is ready for immediate use. IMPRESSION: Placement of non-tunneled hemodialysis catheter via right internal jugular vein. The catheter tip lies in the right atrium. The catheter is ready for immediate use. Electronically Signed   By: Irish LackGlenn  Yamagata M.D.   On: 09/26/2016 11:04   Dg Chest Port 1 View  Result Date: 09/25/2016 CLINICAL DATA:  Acute hypoxemic respiratory failure. EXAM: PORTABLE CHEST 1 VIEW COMPARISON:  Frontal and lateral views yesterday at 1355 hour FINDINGS: Improved lung aeration from prior exam. Cardiomegaly is again seen. Mild vascular congestion. No focal airspace disease. No pleural fluid or pneumothorax. Changes in both shoulders consistent with sickle cell. IMPRESSION: Improved lung aeration.  Cardiomegaly and vascular congestion.  Electronically Signed   By: Rubye Oaks M.D.   On: 09/25/2016 02:30    Lab Data:  CBC:  Recent Labs Lab 09/22/16 0350 09/23/16 0046 09/24/16 0425 09/24/16 2055 09/25/16 0336 09/26/16 0546  WBC 13.4* 15.5* 16.5* 15.1* 14.7* 12.2*  NEUTROABS 9.0*  --   --  12.0*  --   --   HGB 11.6* 11.2* 10.3* 8.8* 8.7* 7.8*  HCT 32.2* 31.1* 29.5* 24.6* 24.5* 21.5*  MCV 83.2 82.5  84.0 80.7 82.5 79.6  PLT 275 123* 68* 63* 65* 69*   Basic Metabolic Panel:  Recent Labs Lab 09/22/16 1251 09/23/16 0046 09/24/16 0425 09/25/16 0336 09/26/16 0546  NA 142 136 138 138 137  K 3.3* 3.5 3.7 3.6 3.5  CL 103 99* 101 103 103  CO2 28 25 30 26 26   GLUCOSE 115* 136* 140* 125* 199*  BUN 21* 21* 22* 23* 19  CREATININE 1.18 1.19 1.30* 1.32* 1.21  CALCIUM 10.0 9.5 8.5* 7.9* 8.0*  MG 1.9  --   --   --   --    GFR: Estimated Creatinine Clearance: 90.4 mL/min (by C-G formula based on SCr of 1.21 mg/dL). Liver Function Tests:  Recent Labs Lab 09/22/16 0350 09/22/16 1251 09/24/16 2055 09/26/16 0546  AST 91* 99* 53* 55*  ALT 158* 152* 71* 71*  ALKPHOS 49 54 125 112  BILITOT 1.2 1.7* 1.6* 1.4*  PROT 7.4 7.8 6.4* 6.2*  ALBUMIN 4.6 4.8 3.1* 2.9*   No results for input(s): LIPASE, AMYLASE in the last 168 hours. No results for input(s): AMMONIA in the last 168 hours. Coagulation Profile: No results for input(s): INR, PROTIME in the last 168 hours. Cardiac Enzymes:  Recent Labs Lab 09/22/16 0350 09/22/16 0612 09/22/16 0652 09/22/16 1251  TROPONINI 0.04* 0.08* 0.09* 0.05*   BNP (last 3 results) No results for input(s): PROBNP in the last 8760 hours. HbA1C: No results for input(s): HGBA1C in the last 72 hours. CBG:  Recent Labs Lab 09/25/16 1103 09/25/16 1641 09/25/16 2033 09/26/16 0743 09/26/16 1104  GLUCAP 134* 145* 139* 138* 109*   Lipid Profile: No results for input(s): CHOL, HDL, LDLCALC, TRIG, CHOLHDL, LDLDIRECT in the last 72 hours. Thyroid Function Tests: No results for input(s): TSH, T4TOTAL, FREET4, T3FREE, THYROIDAB in the last 72 hours. Anemia Panel:  Recent Labs  09/26/16 0546  RETICCTPCT 3.9*   Urine analysis:    Component Value Date/Time   COLORURINE YELLOW 09/24/2016 1203   APPEARANCEUR CLEAR 09/24/2016 1203   LABSPEC 1.013 09/24/2016 1203   PHURINE 5.0 09/24/2016 1203   GLUCOSEU NEGATIVE 09/24/2016 1203   HGBUR NEGATIVE  09/24/2016 1203   BILIRUBINUR NEGATIVE 09/24/2016 1203   KETONESUR NEGATIVE 09/24/2016 1203   PROTEINUR 100 (A) 09/24/2016 1203   UROBILINOGEN 1.0 05/11/2015 0219   NITRITE NEGATIVE 09/24/2016 1203   LEUKOCYTESUR NEGATIVE 09/24/2016 1203     Myrta Mercer A M.D. [facility] 09/26/2016, 1:19 PM  Pager: [phone] Between 7am to 7pm - call Pager - [phone]  After 7pm go to [email or web address] - password [email or web address]  Call night coverage person covering after 7pm

## 2016-09-27 ENCOUNTER — Inpatient Hospital Stay (HOSPITAL_COMMUNITY): Payer: Commercial Managed Care - HMO

## 2016-09-27 DIAGNOSIS — R5081 Fever presenting with conditions classified elsewhere: Secondary | ICD-10-CM

## 2016-09-27 DIAGNOSIS — D57211 Sickle-cell/Hb-C disease with acute chest syndrome: Secondary | ICD-10-CM

## 2016-09-27 DIAGNOSIS — N179 Acute kidney failure, unspecified: Secondary | ICD-10-CM

## 2016-09-27 DIAGNOSIS — E119 Type 2 diabetes mellitus without complications: Secondary | ICD-10-CM

## 2016-09-27 DIAGNOSIS — I10 Essential (primary) hypertension: Secondary | ICD-10-CM

## 2016-09-27 LAB — TYPE AND SCREEN
ABO/RH(D): O NEG
Antibody Screen: NEGATIVE
DAT, IgG: NEGATIVE
Donor AG Type: NEGATIVE
Donor AG Type: NEGATIVE
Donor AG Type: NEGATIVE
Donor AG Type: NEGATIVE
Donor AG Type: NEGATIVE
Donor AG Type: NEGATIVE
Unit division: 0
Unit division: 0
Unit division: 0
Unit division: 0
Unit division: 0
Unit division: 0

## 2016-09-27 LAB — BASIC METABOLIC PANEL
Anion gap: 10 (ref 5–15)
BUN: 13 mg/dL (ref 6–20)
CO2: 29 mmol/L (ref 22–32)
Calcium: 8.3 mg/dL — ABNORMAL LOW (ref 8.9–10.3)
Chloride: 101 mmol/L (ref 101–111)
Creatinine, Ser: 1.16 mg/dL (ref 0.61–1.24)
GFR calc Af Amer: >60 mL/min (ref >60)
GFR calc non Af Amer: >60 mL/min (ref >60)
Glucose, Bld: 150 mg/dL — ABNORMAL HIGH (ref 65–99)
Potassium: 3.2 mmol/L — ABNORMAL LOW (ref 3.5–5.1)
Sodium: 140 mmol/L (ref 135–145)

## 2016-09-27 LAB — BLOOD GAS, ARTERIAL
Acid-Base Excess: 5.9 mmol/L — ABNORMAL HIGH (ref 0.0–2.0)
Acid-Base Excess: 8.6 mmol/L — ABNORMAL HIGH (ref 0.0–2.0)
Bicarbonate: 30.1 mmol/L — ABNORMAL HIGH (ref 20.0–28.0)
Bicarbonate: 32.6 mmol/L — ABNORMAL HIGH (ref 20.0–28.0)
Delivery systems: POSITIVE
Drawn by: 40415
Drawn by: 246101
Expiratory PAP: 5
FIO2: 40
Inspiratory PAP: 10
O2 Saturation: 96.4 %
O2 Saturation: 93.2 %
Patient temperature: 102.1
Patient temperature: 100.3
pCO2 arterial: 49.5 mmHg — ABNORMAL HIGH (ref 32.0–48.0)
pCO2 arterial: 47.1 mmHg (ref 32.0–48.0)
pH, Arterial: 7.411 (ref 7.350–7.450)
pH, Arterial: 7.46 — ABNORMAL HIGH (ref 7.350–7.450)
pO2, Arterial: 94.3 mmHg (ref 83.0–108.0)
pO2, Arterial: 71.1 mmHg — ABNORMAL LOW (ref 83.0–108.0)

## 2016-09-27 LAB — CBC
HCT: 24.8 % — ABNORMAL LOW (ref 39.0–52.0)
Hemoglobin: 8.5 g/dL — ABNORMAL LOW (ref 13.0–17.0)
MCH: 28.4 pg (ref 26.0–34.0)
MCHC: 34.3 g/dL (ref 30.0–36.0)
MCV: 82.9 fL (ref 78.0–100.0)
Platelets: 53 10*3/uL — ABNORMAL LOW (ref 150–400)
RBC: 2.99 MIL/uL — ABNORMAL LOW (ref 4.22–5.81)
RDW: 16 % — ABNORMAL HIGH (ref 11.5–15.5)
WBC: 12.6 10*3/uL — ABNORMAL HIGH (ref 4.0–10.5)

## 2016-09-27 LAB — GLUCOSE, CAPILLARY
Glucose-Capillary: 131 mg/dL — ABNORMAL HIGH (ref 65–99)
Glucose-Capillary: 133 mg/dL — ABNORMAL HIGH (ref 65–99)
Glucose-Capillary: 124 mg/dL — ABNORMAL HIGH (ref 65–99)
Glucose-Capillary: 160 mg/dL — ABNORMAL HIGH (ref 65–99)

## 2016-09-27 MED ORDER — LORAZEPAM 2 MG/ML IJ SOLN
0.5000 mg | Freq: Once | INTRAMUSCULAR | Status: AC
  Administered 2016-09-27: 0.5 mg via INTRAVENOUS
  Filled 2016-09-27: qty 1

## 2016-09-27 MED ORDER — HYDROMORPHONE HCL 1 MG/ML IJ SOLN: 2.0000 mg | INTRAMUSCULAR | Status: DC | PRN

## 2016-09-27 MED ORDER — FUROSEMIDE 10 MG/ML IJ SOLN
40.0000 mg | Freq: Once | INTRAMUSCULAR | Status: AC
  Administered 2016-09-27: 40 mg via INTRAVENOUS
  Filled 2016-09-27: qty 4

## 2016-09-27 NOTE — Progress Notes (Signed)
Name: Nicolas [name] MRN: [id number] DOB: 10/22/1975    ADMISSION DATE:  09/22/2016 CONSULTATION DATE:  09/24/2016  REFERRING MD :  Dr. Sunnie Nielsenegalado   CHIEF COMPLAINT:  Hypoxia   Brief 40 yo male admitted with severe knee, lower back and chest pain sickle cell crisis.  Developed fever, hypoxia, and PCCM consulted.  PMH of sickle cell disease (Follow[facility]), DM, depression, and HTN.  SIGNIFICANT EVENTS  12/25 > Presents ED with Chest, Knee, and Back Pain  12/27 > New Hypoxia and Leukocytosis with CXR showing pulmonary vascular congestion  12/29 > VasCath placed for red cell exchange   STUDIES:  CTA 12/25 > Neg PE, Normal heart and no pericardial effusion  Echo 12/26 > EF 55-60, mild concentric hypertrophy  BLE dopplers 12/28 > Neg DVT  CTA 12/29 > b/l patchy ASD  Cultures:  Blood 12/26 > Urine 12/27 > Neg  RVP 12/27 > Neg   Antibiotics:  Vancomycin 12/27 > Cefepime 12/27 >   SUBJECTIVE:  Was placed on BiPAP early am hours for tachypnea Denies SOB or CP at this time.  CXR repeat this am > no change in B infiltrates Off of PCA since last night   VITAL SIGNS: Temp:  [99 F (37.2 C)-102.1 F (38.9 C)] 99.8 F (37.7 C) (12/30 0402) Pulse Rate:  [97-102] 98 (12/30 0745) Resp:  [13-39] 39 (12/30 0745) BP: (115-162)/(62-96) 142/90 (12/30 0402) SpO2:  [94 %-100 %] 100 % (12/30 0745) FiO2 (%):  [40 %] 40 % (12/29 1408) Weight:  [101.9 kg (224 lb 10.4 oz)] 101.9 kg (224 lb 10.4 oz) (12/29 1258)  PHYSICAL EXAMINATION: General: Adult male, no acute distress   Neuro: alert, follows commands, oriented x 4  HEENT: normocephalic  Cardiovascular: no MRG, Tachy, NI S1/S2 Lungs: unlabored, B crackles. tachypneic Abdomen: non-tender, non-distended, active bowel sounds  Musculoskeletal: no deformities  Skin: Warm, dry, intact     Recent Labs Lab 09/25/16 0336 09/26/16 0546 09/27/16 0502  NA 138 137 140  K 3.6 3.5 3.2*  CL 103 103 101  CO2 26 26 29   BUN 23* 19  13  CREATININE 1.32* 1.21 1.16  GLUCOSE 125* 199* 150*    Recent Labs Lab 09/25/16 0336 09/26/16 0546 09/27/16 0502  HGB 8.7* 7.8* 8.5*  HCT 24.5* 21.5* 24.8*  WBC 14.7* 12.2* 12.6*  PLT 65* 69* 53*   Ct Angio Chest Pe W Or Wo Contrast  Result Date: 09/26/2016 CLINICAL DATA:  Acute chest syndrome.  Sickle cell crisis. EXAM: CT ANGIOGRAPHY CHEST WITH CONTRAST TECHNIQUE: Multidetector CT imaging of the chest was performed using the standard protocol during bolus administration of intravenous contrast. Multiplanar CT image reconstructions and MIPs were obtained to evaluate the vascular anatomy. CONTRAST:  100 cc Isovue 370 IV COMPARISON:  09/22/2016 FINDINGS: Cardiovascular: No filling defects in the pulmonary arteries to suggest pulmonary emboli. Heart is mildly enlarged. Aorta is normal caliber. Mediastinum/Nodes: No mediastinal, hilar, or axillary adenopathy. Lungs/Pleura: Diffuse bilateral airspace opacities, most confluent in the lower lobes. Favor edema. Cannot exclude pneumonia. No pleural effusions. 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 No acute findings in the upper abdomen. Musculoskeletal: Chest wall soft tissues are unremarkable. No acute bony abnormality. Sclerotic lower thoracic and upper lumbar vertebral bodies compatible with sickle cell changes. Review of the MIP images confirms the above findings. IMPRESSION: No evidence of pulmonary embolus. Cardiomegaly. Diffuse bilateral airspace opacities, edema versus pneumonia. Electronically Signed   By: Charlett NoseKevin  Dover M.D.  On: 09/26/2016 10:00   Ir Fluoro Guide Cv Line Right  Result Date: 09/26/2016 CLINICAL DATA:  Renal failure and need for non tunneled hemodialysis catheter placement. EXAM: NON-TUNNELED CENTRAL VENOUS CATHETER PLACEMENT WITH ULTRASOUND AND FLUOROSCOPIC GUIDANCE FLUOROSCOPY TIME:  6 seconds.  4 mGy. PROCEDURE: The procedure, risks, benefits, and alternatives were explained to the patient.  Questions regarding the procedure were encouraged and answered. The patient understands and consents to the procedure. A time-out was performed prior to the procedure. Ultrasound was used to confirm patency of the right internal jugular vein. The right neck was prepped with chlorhexidine in a sterile fashion, and a sterile drape was applied covering the operative field. Maximum barrier sterile technique with sterile gowns and gloves were used for the procedure. Local anesthesia was provided with 1% lidocaine. After creating a small venotomy incision, a 19 gauge needle was advanced into the right internal jugular vein under direct, real-time ultrasound guidance. Ultrasound image documentation was performed. After securing guidewire access, the venotomy was dilated. The 20 cm length, 13 French Trialysis catheter was then advanced over the wire. Catheter positioning was confirmed by fluoroscopy. The catheter was aspirated, flushed with saline, and injected with appropriate volume heparin dwells. The catheter exit site was secured with 0-Prolene retention sutures. COMPLICATIONS: None.  No pneumothorax. FINDINGS: After catheter placement, the tip lies in the right atrium. The catheter aspirates normally and is ready for immediate use. IMPRESSION: Placement of non-tunneled hemodialysis catheter via right internal jugular vein. The catheter tip lies in the right atrium. The catheter is ready for immediate use. Electronically Signed   By: Irish LackGlenn  Yamagata M.D.   On: 09/26/2016 11:04   Ir [place] Guide Vasc Access Right  Result Date: 09/26/2016 CLINICAL DATA:  Renal failure and need for non tunneled hemodialysis catheter placement. EXAM: NON-TUNNELED CENTRAL VENOUS CATHETER PLACEMENT WITH ULTRASOUND AND FLUOROSCOPIC GUIDANCE FLUOROSCOPY TIME:  6 seconds.  4 mGy. PROCEDURE: The procedure, risks, benefits, and alternatives were explained to the patient. Questions regarding the procedure were encouraged and answered. The patient  understands and consents to the procedure. A time-out was performed prior to the procedure. Ultrasound was used to confirm patency of the right internal jugular vein. The right neck was prepped with chlorhexidine in a sterile fashion, and a sterile drape was applied covering the operative field. Maximum barrier sterile technique with sterile gowns and gloves were used for the procedure. Local anesthesia was provided with 1% lidocaine. After creating a small venotomy incision, a 19 gauge needle was advanced into the right internal jugular vein under direct, real-time ultrasound guidance. Ultrasound image documentation was performed. After securing guidewire access, the venotomy was dilated. The 20 cm length, 13 French Trialysis catheter was then advanced over the wire. Catheter positioning was confirmed by fluoroscopy. The catheter was aspirated, flushed with saline, and injected with appropriate volume heparin dwells. The catheter exit site was secured with 0-Prolene retention sutures. COMPLICATIONS: None.  No pneumothorax. FINDINGS: After catheter placement, the tip lies in the right atrium. The catheter aspirates normally and is ready for immediate use. IMPRESSION: Placement of non-tunneled hemodialysis catheter via right internal jugular vein. The catheter tip lies in the right atrium. The catheter is ready for immediate use. Electronically Signed   By: Irish LackGlenn  Yamagata M.D.   On: 09/26/2016 11:04   Dg Chest Port 1 View  Result Date: 09/27/2016 CLINICAL DATA:  Hypoxia. EXAM: PORTABLE CHEST 1 VIEW COMPARISON:  Most recent radiograph 09/25/2016, chest CT 09/26/2016 FINDINGS: Tip of the right central  line at the atrial caval junction. Lower lung volumes from prior exam. Bilateral airspace opacities have progressed from prior chest radiograph, however appears similar to recent CT. Mild cardiomegaly is unchanged. No pleural fluid or pneumothorax. IMPRESSION: Essentially stable bilateral airspace opacities which  may be pulmonary edema, pneumonia, or acute chest syndrome given history of sickle cell. Electronically Signed   By: Rubye Oaks M.D.   On: 09/27/2016 03:42    ASSESSMENT / PLAN:  Acute Hypoxic Respiratory Failure with patchy b/l ASD on CT chest >> pneumonia versus acute chest syndrome. - oxygen to keep SpO2 > 93% - prn Bipap, will attempt to remove now - f/u CXR - consider diuresis, but this raises risk of exacerbating sickling. Will defer unless he changes clinically  - Abx per primary team, currently on cefepime + vanco  Sickle cell pain crisis with hemoglobin [place]. - pain control per primary team, now off PCA   Levy Pupa, MD, PhD 09/27/2016, 8:55 AM [facility] [phone] or if no answer [phone]

## 2016-09-27 NOTE — Progress Notes (Signed)
Patient ambulated the entire unit with RN.  Patient tolerated the walk well, oxygen sats maintained above 95% on 15L NRB.   Throughout the night, [name] and I had spent several minutes talking about his current medical situation.  [name] has expressed that having sickle cell disease is a such a great challenge and that he wishing this on no one. We spoke about his work, his family, and how he wants to get better as soon as he can so that he can provide for his family.  I explained him to that as his healthcare providers we also want him well and fit so that he can return to his home and work life.    [name] would benefit from having a CSW and Chaplain consult.  Emotionally he is upset but realistic and is coping as best as he can.  I expressed to him, that we are all here, if he needs to talk about anything or just have someone listen to him, we would more than happy to do either.  Thank you for allowing me to care for such a humble and appreciative patient.

## 2016-09-27 NOTE — Progress Notes (Signed)
Paged on-call MD to report changes in pt's respiratory status despite use of venti-mask followed by NRB. Pt unable to keep mask on even with constant re-education. Pt desaturates within minutes to the 60's without oxygen. Fever spiked at 102.1, Tylenol given. MD at bedside with recommendations. See order history. Pt placed on BiPAP for the remainder on shift for aide with oxygenation. O2 sats currently in the high 90's. Ativan given x1 for anxiety. Will continue to monitor and assess.

## 2016-09-27 NOTE — Plan of Care (Signed)
Problem: Physical Regulation: Goal: Ability to maintain clinical measurements within normal limits will improve Outcome: Not Progressing Pt requiring BiPAP for acute hypoxic respiratory failure. See notes.

## 2016-09-27 NOTE — Progress Notes (Signed)
Nicolas [name]   DOB:Apr 22, 1976   ZH#:[id number]   MHD#:[id number]  Subjective:  Nicolas [name] is sleepy, likely from medication; thinks his breathing is better today; pain is better controlled. No family in room  He is followed by Dr Maurine Simmering at [facility], was first seen by our group (Dr Myna Hidalgo) 09/25/2016, at which point [patient was started on folate and exchange transfusion recommended. This was perfon=med yesterday w/o event   Objective: middle aged African man examined in bed Vitals:   09/27/16 0745 09/27/16 1151  BP:  (!) 148/74  Pulse: 98 86  Resp: (!) 39 (!) 45  Temp:  (!) 100.4 F (38 C)    Body mass index is 35.18 kg/m.  Pulse Ox currently 100%  Intake/Output Summary (Last 24 hours) at 09/27/16 1440 Last data filed at 09/27/16 1146  Gross per 24 hour  Intake              600 ml  Output             3750 ml  Net            -3150 ml     Sclerae unicteric  Lungs no rales or wheezes--auscultated anterolaterally  Heart regular rate and rhythm  Abdomen soft, +BS  Neuro nonfocal    CBG (last 3)   Recent Labs  09/26/16 2217 09/27/16 0746 09/27/16 1150  GLUCAP 171* 131* 133*     Labs:  Lab Results  Component Value Date   WBC 12.6 (H) 09/27/2016   HGB 8.5 (L) 09/27/2016   HCT 24.8 (L) 09/27/2016   MCV 82.9 09/27/2016   PLT 53 (L) 09/27/2016   NEUTROABS 12.0 (H) 09/24/2016    @LASTCHEMISTRY @  Urine Studies No results for input(s): UHGB, CRYS in the last 72 hours.  Invalid input(s): UACOL, UAPR, USPG, UPH, UTP, UGL, UKET, UBIL, UNIT, UROB, [place], UEPI, UWBC, Donnella Bi [place], [place]  Basic Metabolic Panel:  Recent Labs Lab 09/22/16 1251 09/23/16 0046 09/24/16 0425 09/25/16 0336 09/26/16 0546 09/27/16 0502  NA 142 136 138 138 137 140  K 3.3* 3.5 3.7 3.6 3.5 3.2*  CL 103 99* 101 103 103 101  CO2 28 25 30 26 26 29   GLUCOSE 115* 136* 140* 125* 199* 150*  BUN 21* 21* 22* 23* 19 13  CREATININE 1.18 1.19 1.30* 1.32* 1.21 1.16  CALCIUM 10.0 9.5  8.5* 7.9* 8.0* 8.3*  MG 1.9  --   --   --   --   --    GFR Estimated Creatinine Clearance: 94.3 mL/min (by C-G formula based on SCr of 1.16 mg/dL). Liver Function Tests:  Recent Labs Lab 09/22/16 0350 09/22/16 1251 09/24/16 2055 09/26/16 0546  AST 91* 99* 53* 55*  ALT 158* 152* 71* 71*  ALKPHOS 49 54 125 112  BILITOT 1.2 1.7* 1.6* 1.4*  PROT 7.4 7.8 6.4* 6.2*  ALBUMIN 4.6 4.8 3.1* 2.9*   No results for input(s): LIPASE, AMYLASE in the last 168 hours. No results for input(s): AMMONIA in the last 168 hours. Coagulation profile No results for input(s): INR, PROTIME in the last 168 hours.  CBC:  Recent Labs Lab 09/22/16 0350  09/24/16 0425 09/24/16 2055 09/25/16 0336 09/26/16 0546 09/27/16 0502  WBC 13.4*  < > 16.5* 15.1* 14.7* 12.2* 12.6*  NEUTROABS 9.0*  --   --  12.0*  --   --   --   HGB 11.6*  < > 10.3* 8.8* 8.7* 7.8* 8.5*  HCT 32.2*  < >  29.5* 24.6* 24.5* 21.5* 24.8*  MCV 83.2  < > 84.0 80.7 82.5 79.6 82.9  PLT 275  < > 68* 63* 65* 69* 53*  < > = values in this interval not displayed. Cardiac Enzymes:  Recent Labs Lab 09/22/16 0350 09/22/16 0612 09/22/16 0652 09/22/16 1251  TROPONINI 0.04* 0.08* 0.09* 0.05*   BNP: Invalid input(s): POCBNP CBG:  Recent Labs Lab 09/26/16 1104 09/26/16 1705 09/26/16 2217 09/27/16 0746 09/27/16 1150  GLUCAP 109* 158* 171* 131* 133*  Results for Nicolas [name], Nicolas [name] (MRN [id number]) as of 09/27/2016 14:55  Ref. Range 09/26/2016 05:46  LDH Latest Ref Range: 98 - 192 U/L 871 (H)   D-Dimer No results for input(s): DDIMER in the last 72 hours. Hgb A1c No results for input(s): HGBA1C in the last 72 hours. Lipid Profile No results for input(s): CHOL, HDL, LDLCALC, TRIG, CHOLHDL, LDLDIRECT in the last 72 hours. Thyroid function studies No results for input(s): TSH, T4TOTAL, T3FREE, THYROIDAB in the last 72 hours.  Invalid input(s): FREET3 Anemia work up  Recent Labs  09/26/16 0546  RETICCTPCT 3.9*    Microbiology Recent Results (from the past 240 hour(s))  MRSA PCR Screening     Status: None   Collection Time: 09/22/16 11:26 PM  Result Value Ref Range Status   MRSA by PCR NEGATIVE NEGATIVE Final    Comment:        The GeneXpert MRSA Assay (FDA approved for NASAL specimens only), is one component of a comprehensive MRSA colonization surveillance program. It is not intended to diagnose MRSA infection nor to guide or monitor treatment for MRSA infections.   Culture, blood (Routine X 2) w Reflex to ID Panel     Status: None (Preliminary result)   Collection Time: 09/23/16 12:46 AM  Result Value Ref Range Status   Specimen Description BLOOD RIGHT HAND  Final   Special Requests IN PEDIATRIC BOTTLE 4CC  Final   Culture NO GROWTH 4 DAYS  Final   Report Status PENDING  Incomplete  Culture, blood (Routine X 2) w Reflex to ID Panel     Status: None (Preliminary result)   Collection Time: 09/23/16 12:54 AM  Result Value Ref Range Status   Specimen Description BLOOD LEFT HAND  Final   Special Requests BOTTLES DRAWN AEROBIC AND ANAEROBIC 5CC  Final   Culture NO GROWTH 4 DAYS  Final   Report Status PENDING  Incomplete  Urine culture     Status: None   Collection Time: 09/24/16 12:03 PM  Result Value Ref Range Status   Specimen Description URINE, CLEAN CATCH  Final   Special Requests NONE  Final   Culture NO GROWTH  Final   Report Status 09/25/2016 FINAL  Final  Respiratory Panel by PCR     Status: None   Collection Time: 09/24/16 10:47 PM  Result Value Ref Range Status   Adenovirus NOT DETECTED NOT DETECTED Final   Coronavirus 229E NOT DETECTED NOT DETECTED Final   Coronavirus HKU1 NOT DETECTED NOT DETECTED Final   Coronavirus NL63 NOT DETECTED NOT DETECTED Final   Coronavirus OC43 NOT DETECTED NOT DETECTED Final   Metapneumovirus NOT DETECTED NOT DETECTED Final   Rhinovirus / Enterovirus NOT DETECTED NOT DETECTED Final   Influenza A NOT DETECTED NOT DETECTED Final    Influenza B NOT DETECTED NOT DETECTED Final   Parainfluenza Virus 1 NOT DETECTED NOT DETECTED Final   Parainfluenza Virus 2 NOT DETECTED NOT DETECTED Final   Parainfluenza Virus 3 NOT DETECTED NOT DETECTED  Final   Parainfluenza Virus 4 NOT DETECTED NOT DETECTED Final   Respiratory Syncytial Virus NOT DETECTED NOT DETECTED Final   Bordetella pertussis NOT DETECTED NOT DETECTED Final   Chlamydophila pneumoniae NOT DETECTED NOT DETECTED Final   Mycoplasma pneumoniae NOT DETECTED NOT DETECTED Final      Studies:  Ct Angio Chest Pe W Or Wo Contrast  Result Date: 09/26/2016 CLINICAL DATA:  Acute chest syndrome.  Sickle cell crisis. EXAM: CT ANGIOGRAPHY CHEST WITH CONTRAST TECHNIQUE: Multidetector CT imaging of the chest was performed using the standard protocol during bolus administration of intravenous contrast. Multiplanar CT image reconstructions and MIPs were obtained to evaluate the vascular anatomy. CONTRAST:  100 cc Isovue 370 IV COMPARISON:  09/22/2016 FINDINGS: Cardiovascular: No filling defects in the pulmonary arteries to suggest pulmonary emboli. Heart is mildly enlarged. Aorta is normal caliber. Mediastinum/Nodes: No mediastinal, hilar, or axillary adenopathy. Lungs/Pleura: Diffuse bilateral airspace opacities, most confluent in the lower lobes. Favor edema. Cannot exclude pneumonia. No pleural effusions. 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 No acute findings in the upper abdomen. Musculoskeletal: Chest wall soft tissues are unremarkable. No acute bony abnormality. Sclerotic lower thoracic and upper lumbar vertebral bodies compatible with sickle cell changes. Review of the MIP images confirms the above findings. IMPRESSION: No evidence of pulmonary embolus. Cardiomegaly. Diffuse bilateral airspace opacities, edema versus pneumonia. Electronically Signed   By: Charlett Nose M.D.   On: 09/26/2016 10:00   Ir Fluoro Guide Cv Line Right  Result Date:  09/26/2016 CLINICAL DATA:  Renal failure and need for non tunneled hemodialysis catheter placement. EXAM: NON-TUNNELED CENTRAL VENOUS CATHETER PLACEMENT WITH ULTRASOUND AND FLUOROSCOPIC GUIDANCE FLUOROSCOPY TIME:  6 seconds.  4 mGy. PROCEDURE: The procedure, risks, benefits, and alternatives were explained to the patient. Questions regarding the procedure were encouraged and answered. The patient understands and consents to the procedure. A time-out was performed prior to the procedure. Ultrasound was used to confirm patency of the right internal jugular vein. The right neck was prepped with chlorhexidine in a sterile fashion, and a sterile drape was applied covering the operative field. Maximum barrier sterile technique with sterile gowns and gloves were used for the procedure. Local anesthesia was provided with 1% lidocaine. After creating a small venotomy incision, a 19 gauge needle was advanced into the right internal jugular vein under direct, real-time ultrasound guidance. Ultrasound image documentation was performed. After securing guidewire access, the venotomy was dilated. The 20 cm length, 13 French Trialysis catheter was then advanced over the wire. Catheter positioning was confirmed by fluoroscopy. The catheter was aspirated, flushed with saline, and injected with appropriate volume heparin dwells. The catheter exit site was secured with 0-Prolene retention sutures. COMPLICATIONS: None.  No pneumothorax. FINDINGS: After catheter placement, the tip lies in the right atrium. The catheter aspirates normally and is ready for immediate use. IMPRESSION: Placement of non-tunneled hemodialysis catheter via right internal jugular vein. The catheter tip lies in the right atrium. The catheter is ready for immediate use. Electronically Signed   By: Irish Lack M.D.   On: 09/26/2016 11:04   Ir US Guide Vasc Access Right  Result Date: 09/26/2016 CLINICAL DATA:  Renal failure and need for non tunneled  hemodialysis catheter placement. EXAM: NON-TUNNELED CENTRAL VENOUS CATHETER PLACEMENT WITH ULTRASOUND AND FLUOROSCOPIC GUIDANCE FLUOROSCOPY TIME:  6 seconds.  4 mGy. PROCEDURE: The procedure, risks, benefits, and alternatives were explained to the patient. Questions regarding the procedure were encouraged and answered. The patient understands and  consents to the procedure. A time-out was performed prior to the procedure. Ultrasound was used to confirm patency of the right internal jugular vein. The right neck was prepped with chlorhexidine in a sterile fashion, and a sterile drape was applied covering the operative field. Maximum barrier sterile technique with sterile gowns and gloves were used for the procedure. Local anesthesia was provided with 1% lidocaine. After creating a small venotomy incision, a 19 gauge needle was advanced into the right internal jugular vein under direct, real-time ultrasound guidance. Ultrasound image documentation was performed. After securing guidewire access, the venotomy was dilated. The 20 cm length, 13 French Trialysis catheter was then advanced over the wire. Catheter positioning was confirmed by fluoroscopy. The catheter was aspirated, flushed with saline, and injected with appropriate volume heparin dwells. The catheter exit site was secured with 0-Prolene retention sutures. COMPLICATIONS: None.  No pneumothorax. FINDINGS: After catheter placement, the tip lies in the right atrium. The catheter aspirates normally and is ready for immediate use. IMPRESSION: Placement of non-tunneled hemodialysis catheter via right internal jugular vein. The catheter tip lies in the right atrium. The catheter is ready for immediate use. Electronically Signed   By: Irish LackGlenn  Yamagata M.D.   On: 09/26/2016 11:04   Dg Chest Port 1 View  Result Date: 09/27/2016 CLINICAL DATA:  Hypoxia. EXAM: PORTABLE CHEST 1 VIEW COMPARISON:  Most recent radiograph 09/25/2016, chest CT 09/26/2016 FINDINGS: Tip of  the right central line at the atrial caval junction. Lower lung volumes from prior exam. Bilateral airspace opacities have progressed from prior chest radiograph, however appears similar to recent CT. Mild cardiomegaly is unchanged. No pleural fluid or pneumothorax. IMPRESSION: Essentially stable bilateral airspace opacities which may be pulmonary edema, pneumonia, or acute chest syndrome given history of sickle cell. Electronically Signed   By: Rubye OaksMelanie  Ehinger M.D.   On: 09/27/2016 03:42    Assessment: 40 y.o. 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 Will follow LDH and retic count as well. Would continue supportive measures as you are doing and will reassess in AM, consider repeat exchange transfusion if no significant improvement   Lowella DellMAGRINAT,GUSTAV C, MD 09/27/2016  2:40 PM Medical Oncology and Hematology [facility] [address] [place], [place] [address] Tel. [phone]    Fax. [phone]

## 2016-09-27 NOTE — Progress Notes (Signed)
Patient ID: Nicolas [name], male   DOB: 05-07-1976, 40 y.o.   MRN: [id number] Subjective: Patient is a 40 y.o.malewith sickle cell disease, NIDDM and HTN who presented with severe knee pain, low back pain and chest pain.with sickle cell disease, NIDDM and HTN who presented with severe knee pain, low back pain and chest pain. The chest pain was aching, constant, severe, and not typical of his sickle cell. He has no fever, sputum. The knee pain and low back pain are typical of his sickle cell pain, however this did not respond to his home Percocetso he came to the emergency room.  Patient was admitted with sickle cell VOC on 09/22/2016, has been managed by the hospitalist team and multiple specialists since admission. He spiked fever and developed acute hypoxic respiratory failure during this admission, he was started on braod spectrum antibiotics, received IV lasix for possible pulmonary edema on CXR, and PCCM was consulted. His hemoglobin started to decrease. Concern for Acute Chest Syndrome. Hematology consulted. Patient had exchange blood transfusion yesterday and started on BiPAP. His oxygenation has since improved although he still has tachypnea.  Today, patient seen on non-rebreather, saturating 100%. No new complaint, patient said he feels a little better than yesterday although his nurse thinks otherwise. He is still having fever up to 102.1 this morning. Hb is 8.5. He is on Vancomycin and Cefepime and S/P EBT for ACS.   Objective:  Vital signs in last 24 hours:  Vitals:   09/27/16 0315 09/27/16 0402 09/27/16 0745 09/27/16 1151  BP:  (!) 142/90  (!) 148/74  Pulse: 97 98 98 86  Resp: (!) 35 (!) 33 (!) 39 (!) 45  Temp:  99.8 F (37.7 C)  (!) 100.4 F (38 C)  TempSrc:  Axillary  Axillary  SpO2: 96% 96% 100% 100%  Weight:      Height:        Intake/Output from previous day:   Intake/Output Summary (Last 24 hours) at 09/27/16 1359 Last data filed at 09/27/16 1146  Gross per 24 hour  Intake              600 ml  Output             3750 ml  Net            -3150 ml    Physical  Exam: General: Alert, awake, oriented x3, in no acute distress.  HEENT: [place]/AT PEERL, EOMI Neck: Trachea midline,  no masses, no thyromegal,y no JVD, no carotid bruit OROPHARYNX:  Moist, No exudate/ erythema/lesions.  Heart: Regular rate and rhythm, without murmurs, rubs, gallops, PMI non-displaced, no heaves or thrills on palpation.  Lungs: Clear to auscultation, no wheezing or rhonchi noted. No increased vocal fremitus resonant to percussion  Abdomen: Soft, nontender, nondistended, positive bowel sounds, no masses no hepatosplenomegaly noted..  Neuro: No focal neurological deficits noted cranial nerves II through XII grossly intact. DTRs 2+ bilaterally upper and lower extremities. Strength 5 out of 5 in bilateral upper and lower extremities. Musculoskeletal: No warm swelling or erythema around joints, no spinal tenderness noted. Psychiatric: Patient alert and oriented x3, good insight and cognition, good recent to remote recall. Lymph node survey: No cervical axillary or inguinal lymphadenopathy noted.  Lab Results:  Basic Metabolic Panel:    Component Value Date/Time   NA 140 09/27/2016 0502   K 3.2 (L) 09/27/2016 0502   CL 101 09/27/2016 0502   CO2 29 09/27/2016 0502   BUN 13 09/27/2016 0502   CREATININE 1.16 09/27/2016 0502   GLUCOSE 150 (H) 09/27/2016 0502   CALCIUM 8.3 (L) 09/27/2016  0502   CBC:    Component Value Date/Time   WBC 12.6 (H) 09/27/2016 0502   HGB 8.5 (L) 09/27/2016 0502   HCT 24.8 (L) 09/27/2016 0502   PLT 53 (L) 09/27/2016 0502   MCV 82.9 09/27/2016 0502   NEUTROABS 12.0 (H) 09/24/2016 2055   LYMPHSABS 1.9 09/24/2016 2055   MONOABS 0.8 09/24/2016 2055   EOSABS 0.4 09/24/2016 2055   BASOSABS 0.0 09/24/2016 2055    Recent Results (from the past 240 hour(s))  MRSA PCR Screening     Status: None   Collection Time: 09/22/16 11:26 PM  Result Value Ref Range Status   MRSA by PCR NEGATIVE NEGATIVE Final    Comment:        The GeneXpert MRSA Assay  (FDA approved for NASAL specimens only), is one component of a comprehensive MRSA colonization surveillance program. It is not intended to diagnose MRSA infection nor to guide or monitor treatment for MRSA infections.   Culture, blood (Routine X 2) w Reflex to ID Panel     Status: None (Preliminary result)   Collection Time: 09/23/16 12:46 AM  Result Value Ref Range Status   Specimen Description BLOOD RIGHT HAND  Final   Special Requests IN PEDIATRIC BOTTLE 4CC  Final   Culture NO GROWTH 4 DAYS  Final   Report Status PENDING  Incomplete  Culture, blood (Routine X 2) w Reflex to ID Panel     Status: None (Preliminary result)   Collection Time: 09/23/16 12:54 AM  Result Value Ref Range Status   Specimen Description BLOOD LEFT HAND  Final   Special Requests BOTTLES DRAWN AEROBIC AND ANAEROBIC 5CC  Final   Culture NO GROWTH 4 DAYS  Final   Report Status PENDING  Incomplete  Urine culture     Status: None   Collection Time: 09/24/16 12:03 PM  Result Value Ref Range Status   Specimen Description URINE, CLEAN CATCH  Final   Special Requests NONE  Final   Culture NO GROWTH  Final   Report Status 09/25/2016 FINAL  Final  Respiratory Panel by PCR     Status: None   Collection Time: 09/24/16 10:47 PM  Result Value Ref Range Status   Adenovirus NOT DETECTED NOT DETECTED Final   Coronavirus 229E NOT DETECTED NOT DETECTED Final   Coronavirus HKU1 NOT DETECTED NOT DETECTED Final   Coronavirus NL63 NOT DETECTED NOT DETECTED Final   Coronavirus OC43 NOT DETECTED NOT DETECTED Final   Metapneumovirus NOT DETECTED NOT DETECTED Final   Rhinovirus / Enterovirus NOT DETECTED NOT DETECTED Final   Influenza A NOT DETECTED NOT DETECTED Final   Influenza B NOT DETECTED NOT DETECTED Final   Parainfluenza Virus 1 NOT DETECTED NOT DETECTED Final   Parainfluenza Virus 2 NOT DETECTED NOT DETECTED Final   Parainfluenza Virus 3 NOT DETECTED NOT DETECTED Final   Parainfluenza Virus 4 NOT DETECTED NOT  DETECTED Final   Respiratory Syncytial Virus NOT DETECTED NOT DETECTED Final   Bordetella pertussis NOT DETECTED NOT DETECTED Final   Chlamydophila pneumoniae NOT DETECTED NOT DETECTED Final   Mycoplasma pneumoniae NOT DETECTED NOT DETECTED Final    Studies/Results: Ct Angio Chest Pe W Or Wo Contrast  Result Date: 09/26/2016 CLINICAL DATA:  Acute chest syndrome.  Sickle cell crisis. EXAM: CT ANGIOGRAPHY CHEST WITH CONTRAST TECHNIQUE: Multidetector CT imaging of the chest was performed using the standard protocol during bolus administration of intravenous contrast. Multiplanar CT image reconstructions and MIPs were obtained to evaluate the vascular  anatomy. CONTRAST:  100 cc Isovue 370 IV COMPARISON:  09/22/2016 FINDINGS: Cardiovascular: No filling defects in the pulmonary arteries to suggest pulmonary emboli. Heart is mildly enlarged. Aorta is normal caliber. Mediastinum/Nodes: No mediastinal, hilar, or axillary adenopathy. Lungs/Pleura: Diffuse bilateral airspace opacities, most confluent in the lower lobes. Favor edema. Cannot exclude pneumonia. No pleural effusions. 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 No acute findings in the upper abdomen. Musculoskeletal: Chest wall soft tissues are unremarkable. No acute bony abnormality. Sclerotic lower thoracic and upper lumbar vertebral bodies compatible with sickle cell changes. Review of the MIP images confirms the above findings. IMPRESSION: No evidence of pulmonary embolus. Cardiomegaly. Diffuse bilateral airspace opacities, edema versus pneumonia. Electronically Signed   By: Charlett Nose M.D.   On: 09/26/2016 10:00   Ir Fluoro Guide Cv Line Right  Result Date: 09/26/2016 CLINICAL DATA:  Renal failure and need for non tunneled hemodialysis catheter placement. EXAM: NON-TUNNELED CENTRAL VENOUS CATHETER PLACEMENT WITH ULTRASOUND AND FLUOROSCOPIC GUIDANCE FLUOROSCOPY TIME:  6 seconds.  4 mGy. PROCEDURE: The procedure, risks,  benefits, and alternatives were explained to the patient. Questions regarding the procedure were encouraged and answered. The patient understands and consents to the procedure. A time-out was performed prior to the procedure. Ultrasound was used to confirm patency of the right internal jugular vein. The right neck was prepped with chlorhexidine in a sterile fashion, and a sterile drape was applied covering the operative field. Maximum barrier sterile technique with sterile gowns and gloves were used for the procedure. Local anesthesia was provided with 1% lidocaine. After creating a small venotomy incision, a 19 gauge needle was advanced into the right internal jugular vein under direct, real-time ultrasound guidance. Ultrasound image documentation was performed. After securing guidewire access, the venotomy was dilated. The 20 cm length, 13 French Trialysis catheter was then advanced over the wire. Catheter positioning was confirmed by fluoroscopy. The catheter was aspirated, flushed with saline, and injected with appropriate volume heparin dwells. The catheter exit site was secured with 0-Prolene retention sutures. COMPLICATIONS: None.  No pneumothorax. FINDINGS: After catheter placement, the tip lies in the right atrium. The catheter aspirates normally and is ready for immediate use. IMPRESSION: Placement of non-tunneled hemodialysis catheter via right internal jugular vein. The catheter tip lies in the right atrium. The catheter is ready for immediate use. Electronically Signed   By: Irish Lack M.D.   On: 09/26/2016 11:04   Ir US Guide Vasc Access Right  Result Date: 09/26/2016 CLINICAL DATA:  Renal failure and need for non tunneled hemodialysis catheter placement. EXAM: NON-TUNNELED CENTRAL VENOUS CATHETER PLACEMENT WITH ULTRASOUND AND FLUOROSCOPIC GUIDANCE FLUOROSCOPY TIME:  6 seconds.  4 mGy. PROCEDURE: The procedure, risks, benefits, and alternatives were explained to the patient. Questions  regarding the procedure were encouraged and answered. The patient understands and consents to the procedure. A time-out was performed prior to the procedure. Ultrasound was used to confirm patency of the right internal jugular vein. The right neck was prepped with chlorhexidine in a sterile fashion, and a sterile drape was applied covering the operative field. Maximum barrier sterile technique with sterile gowns and gloves were used for the procedure. Local anesthesia was provided with 1% lidocaine. After creating a small venotomy incision, a 19 gauge needle was advanced into the right internal jugular vein under direct, real-time ultrasound guidance. Ultrasound image documentation was performed. After securing guidewire access, the venotomy was dilated. The 20 cm length, 13 French Trialysis catheter was then advanced over  the wire. Catheter positioning was confirmed by fluoroscopy. The catheter was aspirated, flushed with saline, and injected with appropriate volume heparin dwells. The catheter exit site was secured with 0-Prolene retention sutures. COMPLICATIONS: None.  No pneumothorax. FINDINGS: After catheter placement, the tip lies in the right atrium. The catheter aspirates normally and is ready for immediate use. IMPRESSION: Placement of non-tunneled hemodialysis catheter via right internal jugular vein. The catheter tip lies in the right atrium. The catheter is ready for immediate use. Electronically Signed   By: Irish Lack M.D.   On: 09/26/2016 11:04   Dg Chest Port 1 View  Result Date: 09/27/2016 CLINICAL DATA:  Hypoxia. EXAM: PORTABLE CHEST 1 VIEW COMPARISON:  Most recent radiograph 09/25/2016, chest CT 09/26/2016 FINDINGS: Tip of the right central line at the atrial caval junction. Lower lung volumes from prior exam. Bilateral airspace opacities have progressed from prior chest radiograph, however appears similar to recent CT. Mild cardiomegaly is unchanged. No pleural fluid or pneumothorax.  IMPRESSION: Essentially stable bilateral airspace opacities which may be pulmonary edema, pneumonia, or acute chest syndrome given history of sickle cell. Electronically Signed   By: Rubye Oaks M.D.   On: 09/27/2016 03:42    Medications: Scheduled Meds: . carvedilol  25 mg Oral BID  . ceFEPime (MAXIPIME) IV  1 g Intravenous Q8H  . folic acid  2 mg Oral Daily  . insulin aspart  0-15 Units Subcutaneous TID WC  . insulin aspart  0-5 Units Subcutaneous QHS  . senna-docusate  1 tablet Oral BID  . vancomycin  1,000 mg Intravenous Q8H   Continuous Infusions: PRN Meds:.acetaminophen, hydrALAZINE, HYDROmorphone (DILAUDID) injection, levalbuterol, lidocaine (PF), oxyCODONE-acetaminophen **AND** oxyCODONE, polyethylene glycol, white petrolatum  Consultants:  Pulmonologist and Hematologist  Procedures:  None  Antibiotics:  IV Vancomycin and Cefepime  Assessment/Plan: Principal Problem:   Sickle cell pain crisis (HCC) Active Problems:   Chest pain   Type 2 diabetes mellitus without complication, without long-term current use of insulin (HCC)   Essential hypertension   Elevated LFTs   Sickle cell disease (HCC)   Elevated troponin   AKI (acute kidney injury) (HCC)   Sickle cell crisis (HCC)   Acute chest syndrome due to sickle cell crisis (HCC)   Acute hypoxemic respiratory failure (HCC)   HAP (hospital-acquired pneumonia)  1. Acute Hypoxic Respiratory Failure: Pulmonary Edema Vs ACS Vs Pneumonia - CXR this morning showed: "Essentially stable bilateral airspace opacities which may be pulmonary edema, pneumonia, or acute chest syndrome given history of sickle cell"  - Received  IV dose of lasix 12-27 and 28.  - Patient on BiPAP alt non-rebreather oxygen, will continue - Continue with IV antibiotics.  - Repeated CT angio with pna vs pulmonary edema. Negative for PE.  - Patient is status post exchange transfusion.  - Discussed with pulmonary CCM, no need to transfer to ICU or  stepdown at this time   2. Vaso-occlussive Sickle Cell Pain crisis:  -Pain has been improving, hypoxemia persist. S/P exchange transfusion.  - Will add IV Dilaudid 2 mg Q 3 H prn pain, - Patient is off PCA - Continue per sickle cell protocol for pain control.  - Continue home Percocet (only takes 2-3 per day), IV Toradol contraindicated with troponin leak and AKI - Hold IV fluids due to worsening hypoxemia and vascular congestion.  - Diphenhydramine, ondansetron and bowel regimen as needed  3. Acute kidney injury: - Likely due to #1, dehydration - Resolving, serum creatinine now back to normal -  Continue to monitor  4. NIDDM: -Hold home metformin and Januvia -Continue sliding scale insulin  5. Hypertension: - hold  amlodipine, continue on carvedilol - Continue to hold HCTZ, triamterene   Code Status: Full Code Family Communication: N/A Disposition Plan: Not yet ready for discharge  Cliffie Gingras  If 7PM-7AM, please contact night-coverage.  09/27/2016, 1:59 PM  LOS: 5 days

## 2016-09-27 NOTE — Progress Notes (Addendum)
Repeat ABG results called to Dr. Delton CoombesByrum, MD stated he would come see patient.  Patient more lethargic than normal but has received dose of ativan at 0351.  1450 urine output since Lasix at 0350 this am.  Dr. Hyman HopesJegede paged and asked to come assess patient.  Primary RN Dawn at bedside and aware.  Colman Caterarpley, Trammell Bowden Danielle   Patient reports working for a [employer] where many of his coworkers have respiratory problems.  Dr. Delton CoombesByrum updated.  Colman Caterarpley, Anais Denslow Danielle

## 2016-09-27 NOTE — Progress Notes (Signed)
Pt is awake and alert.  Pt w/ tachypnea RR 40's, but no increased WOB noted, pt denies SOB currently.  RN in room and aware.  ABG drawn/sent and pending.

## 2016-09-27 NOTE — Progress Notes (Signed)
Asked to come to bedside to assess this patient for acute hypoxic respiratory failure.  Symptoms progressive since admission, with failure to wean from a 100% NRB in the past 24 hours.  He is S/P red cell exchange transfusion.  He is also on broad spectrum IV antibiotics for recurrent fever.  The patient is noted to be tachypnic but he denies chest pain or shortness of breath.  He is alert and oriented x 4.  STAT ABG shows pH 7.4, pCO2 49, pO2 94 (on NRB).  Results discussed with PCCM attending.  BiPAP has been set up.  Plan to leave the patient on continuous BiPAP for now and repeat assessment in four hours.  Trial of IV ativan 0.5mg  one time for anxiety.  Empiric lasix 40mg  x one.  He is not on maintenance fluids.  Continue current antibiotics.  Low threshold for ICU transfer.

## 2016-09-28 ENCOUNTER — Inpatient Hospital Stay (HOSPITAL_COMMUNITY): Payer: Commercial Managed Care - HMO

## 2016-09-28 LAB — CULTURE, BLOOD (ROUTINE X 2)
Culture: NO GROWTH
Culture: NO GROWTH

## 2016-09-28 LAB — COMPREHENSIVE METABOLIC PANEL
ALT: 67 U/L — ABNORMAL HIGH (ref 17–63)
AST: 49 U/L — ABNORMAL HIGH (ref 15–41)
Albumin: 2.5 g/dL — ABNORMAL LOW (ref 3.5–5.0)
Alkaline Phosphatase: 105 U/L (ref 38–126)
Anion gap: 4 — ABNORMAL LOW (ref 5–15)
BUN: 13 mg/dL (ref 6–20)
CO2: 36 mmol/L — ABNORMAL HIGH (ref 22–32)
Calcium: 8.5 mg/dL — ABNORMAL LOW (ref 8.9–10.3)
Chloride: 97 mmol/L — ABNORMAL LOW (ref 101–111)
Creatinine, Ser: 1.07 mg/dL (ref 0.61–1.24)
GFR calc Af Amer: >60 mL/min (ref >60)
GFR calc non Af Amer: >60 mL/min (ref >60)
Glucose, Bld: 149 mg/dL — ABNORMAL HIGH (ref 65–99)
Potassium: 3.3 mmol/L — ABNORMAL LOW (ref 3.5–5.1)
Sodium: 137 mmol/L (ref 135–145)
Total Bilirubin: 1.1 mg/dL (ref 0.3–1.2)
Total Protein: 6.1 g/dL — ABNORMAL LOW (ref 6.5–8.1)

## 2016-09-28 LAB — CBC WITH DIFFERENTIAL/PLATELET
Band Neutrophils: 0 %
Basophils Absolute: 0 10*3/uL (ref 0.0–0.1)
Basophils Relative: 0 %
Blasts: 0 %
Eosinophils Absolute: 0.3 10*3/uL (ref 0.0–0.7)
Eosinophils Relative: 2 %
HCT: 22.8 % — ABNORMAL LOW (ref 39.0–52.0)
Hemoglobin: 7.8 g/dL — ABNORMAL LOW (ref 13.0–17.0)
Lymphocytes Relative: 12 %
Lymphs Abs: 1.6 10*3/uL (ref 0.7–4.0)
MCH: 28.8 pg (ref 26.0–34.0)
MCHC: 34.2 g/dL (ref 30.0–36.0)
MCV: 84.1 fL (ref 78.0–100.0)
Metamyelocytes Relative: 0 %
Monocytes Absolute: 0.7 10*3/uL (ref 0.1–1.0)
Monocytes Relative: 5 %
Myelocytes: 0 %
Neutro Abs: 10.8 10*3/uL — ABNORMAL HIGH (ref 1.7–7.7)
Neutrophils Relative %: 81 %
Platelets: 79 10*3/uL — ABNORMAL LOW (ref 150–400)
Promyelocytes Absolute: 0 %
RBC: 2.71 MIL/uL — ABNORMAL LOW (ref 4.22–5.81)
RDW: 16.8 % — ABNORMAL HIGH (ref 11.5–15.5)
WBC: 13.4 10*3/uL — ABNORMAL HIGH (ref 4.0–10.5)
nRBC: 0 /100 WBC

## 2016-09-28 LAB — GLUCOSE, CAPILLARY
Glucose-Capillary: 145 mg/dL — ABNORMAL HIGH (ref 65–99)
Glucose-Capillary: 131 mg/dL — ABNORMAL HIGH (ref 65–99)
Glucose-Capillary: 115 mg/dL — ABNORMAL HIGH (ref 65–99)
Glucose-Capillary: 119 mg/dL — ABNORMAL HIGH (ref 65–99)

## 2016-09-28 LAB — LACTATE DEHYDROGENASE
LDH: 632 U/L — ABNORMAL HIGH (ref 98–192)
LDH: 603 U/L — ABNORMAL HIGH (ref 98–192)

## 2016-09-28 LAB — RETICULOCYTES
RBC.: 2.71 MIL/uL — ABNORMAL LOW (ref 4.22–5.81)
Retic Count, Absolute: 105.7 10*3/uL (ref 19.0–186.0)
Retic Ct Pct: 3.9 % — ABNORMAL HIGH (ref 0.4–3.1)

## 2016-09-28 NOTE — Progress Notes (Signed)
Nicolas [name]   DOB:09/19/1976   ZO#:[id number]#:[id number]   JWJ#:[id number]#:[id number]  Subjective:  Nicolas [name] is greatly improved today. He is ambulating, maintaining his oxygenation with his non-rebreather at 15%, denies cough or pleurisy and tells me his pain is down to "a 2." He is eager to go home   Objective: middle aged African man examined in bed Vitals:   09/28/16 0452 09/28/16 0826  BP: (!) 148/76 132/68  Pulse: 92 91  Resp: (!) 26 20  Temp: 98.2 F (36.8 C) 97.8 F (36.6 C)    Body mass index is 35.18 kg/m.  Pulse Ox currently 92%  Intake/Output Summary (Last 24 hours) at 09/28/16 1128 Last data filed at 09/28/16 0838  Gross per 24 hour  Intake              840 ml  Output             1450 ml  Net             -610 ml     Sclerae unicteric, EOMs intact  Lungs no rales or wheezes bilaterally  Heart regular rate and rhythm  Abdomen soft, +BS  Neuro nonfocal, alert, well-oriented    CBG (last 3)   Recent Labs  09/27/16 1639 09/27/16 2151 09/28/16 0821  GLUCAP 124* 160* 145*     Labs:  Lab Results  Component Value Date   WBC 13.4 (H) 09/28/2016   HGB 7.8 (L) 09/28/2016   HCT 22.8 (L) 09/28/2016   MCV 84.1 09/28/2016   PLT 79 (L) 09/28/2016   NEUTROABS 10.8 (H) 09/28/2016    @LASTCHEMISTRY @  Urine Studies No results for input(s): UHGB, CRYS in the last 72 hours.  Invalid input(s): UACOL, UAPR, USPG, UPH, UTP, UGL, UKET, UBIL, UNIT, UROB, [place]LEU, UEPI, UWBC, Ayesha RumpfURBC, UBAC, [place], [place]COM, [place]ILUA  Basic Metabolic Panel:  Recent Labs Lab 09/22/16 1251  09/24/16 0425 09/25/16 0336 09/26/16 0546 09/27/16 0502 09/28/16 0715  NA 142  < > 138 138 137 140 137  K 3.3*  < > 3.7 3.6 3.5 3.2* 3.3*  CL 103  < > 101 103 103 101 97*  CO2 28  < > 30 26 26 29  36*  GLUCOSE 115*  < > 140* 125* 199* 150* 149*  BUN 21*  < > 22* 23* 19 13 13   CREATININE 1.18  < > 1.30* 1.32* 1.21 1.16 1.07  CALCIUM 10.0  < > 8.5* 7.9* 8.0* 8.3* 8.5*  MG 1.9  --   --   --   --   --   --   < > = values in  this interval not displayed. GFR Estimated Creatinine Clearance: 102.3 mL/min (by C-G formula based on SCr of 1.07 mg/dL). Liver Function Tests:  Recent Labs Lab 09/22/16 0350 09/22/16 1251 09/24/16 2055 09/26/16 0546 09/28/16 0715  AST 91* 99* 53* 55* 49*  ALT 158* 152* 71* 71* 67*  ALKPHOS 49 54 125 112 105  BILITOT 1.2 1.7* 1.6* 1.4* 1.1  PROT 7.4 7.8 6.4* 6.2* 6.1*  ALBUMIN 4.6 4.8 3.1* 2.9* 2.5*   No results for input(s): LIPASE, AMYLASE in the last 168 hours. No results for input(s): AMMONIA in the last 168 hours. Coagulation profile No results for input(s): INR, PROTIME in the last 168 hours.  CBC:  Recent Labs Lab 09/22/16 0350  09/24/16 2055 09/25/16 0336 09/26/16 0546 09/27/16 0502 09/28/16 0715  WBC 13.4*  < > 15.1* 14.7* 12.2* 12.6* 13.4*  NEUTROABS 9.0*  --  12.0*  --   --   --  10.8*  HGB 11.6*  < > 8.8* 8.7* 7.8* 8.5* 7.8*  HCT 32.2*  < > 24.6* 24.5* 21.5* 24.8* 22.8*  MCV 83.2  < > 80.7 82.5 79.6 82.9 84.1  PLT 275  < > 63* 65* 69* 53* 79*  < > = values in this interval not displayed. Cardiac Enzymes:  Recent Labs Lab 09/22/16 0350 09/22/16 0612 09/22/16 0652 09/22/16 1251  TROPONINI 0.04* 0.08* 0.09* 0.05*   BNP: Invalid input(s): POCBNP CBG:  Recent Labs Lab 09/27/16 0746 09/27/16 1150 09/27/16 1639 09/27/16 2151 09/28/16 0821  GLUCAP 131* 133* 124* 160* 145*  Results for Nicolas [name], Nicolas [name] (MRN [id number]) as of 09/27/2016 14:55   Ref Range & Units 07:15 2d ago    LDH 98 - 192 U/L 632   871    Resulting Agency        Ref Range & Units 07:15 2d ago 6d ago 55mo ago   Retic Ct Pct 0.4 - 3.1 % 3.9   3.9   7.4   6.5     RBC. 4.22 - 5.81 MIL/uL 2.71   2.70   3.87   3.70     Retic Count, Manual 19.0 - 186.0 K/uL 105.7  105.3  286.4   240.5    Resulting Agency         D-Dimer No results for input(s): DDIMER in the last 72 hours. Hgb A1c No results for input(s): HGBA1C in the last 72 hours. Lipid Profile No results for input(s):  CHOL, HDL, LDLCALC, TRIG, CHOLHDL, LDLDIRECT in the last 72 hours. Thyroid function studies No results for input(s): TSH, T4TOTAL, T3FREE, THYROIDAB in the last 72 hours.  Invalid input(s): FREET3 Anemia work up  Recent Labs  09/26/16 0546 09/28/16 0715  RETICCTPCT 3.9* 3.9*   Microbiology Recent Results (from the past 240 hour(s))  MRSA PCR Screening     Status: None   Collection Time: 09/22/16 11:26 PM  Result Value Ref Range Status   MRSA by PCR NEGATIVE NEGATIVE Final    Comment:        The GeneXpert MRSA Assay (FDA approved for NASAL specimens only), is one component of a comprehensive MRSA colonization surveillance program. It is not intended to diagnose MRSA infection nor to guide or monitor treatment for MRSA infections.   Culture, blood (Routine X 2) w Reflex to ID Panel     Status: None (Preliminary result)   Collection Time: 09/23/16 12:46 AM  Result Value Ref Range Status   Specimen Description BLOOD RIGHT HAND  Final   Special Requests IN PEDIATRIC BOTTLE 4CC  Final   Culture NO GROWTH 4 DAYS  Final   Report Status PENDING  Incomplete  Culture, blood (Routine X 2) w Reflex to ID Panel     Status: None (Preliminary result)   Collection Time: 09/23/16 12:54 AM  Result Value Ref Range Status   Specimen Description BLOOD LEFT HAND  Final   Special Requests BOTTLES DRAWN AEROBIC AND ANAEROBIC 5CC  Final   Culture NO GROWTH 4 DAYS  Final   Report Status PENDING  Incomplete  Urine culture     Status: None   Collection Time: 09/24/16 12:03 PM  Result Value Ref Range Status   Specimen Description URINE, CLEAN CATCH  Final   Special Requests NONE  Final   Culture NO GROWTH  Final   Report Status 09/25/2016 FINAL  Final  Respiratory Panel by PCR  Status: None   Collection Time: 09/24/16 10:47 PM  Result Value Ref Range Status   Adenovirus NOT DETECTED NOT DETECTED Final   Coronavirus 229E NOT DETECTED NOT DETECTED Final   Coronavirus HKU1 NOT DETECTED NOT  DETECTED Final   Coronavirus NL63 NOT DETECTED NOT DETECTED Final   Coronavirus OC43 NOT DETECTED NOT DETECTED Final   Metapneumovirus NOT DETECTED NOT DETECTED Final   Rhinovirus / Enterovirus NOT DETECTED NOT DETECTED Final   Influenza A NOT DETECTED NOT DETECTED Final   Influenza B NOT DETECTED NOT DETECTED Final   Parainfluenza Virus 1 NOT DETECTED NOT DETECTED Final   Parainfluenza Virus 2 NOT DETECTED NOT DETECTED Final   Parainfluenza Virus 3 NOT DETECTED NOT DETECTED Final   Parainfluenza Virus 4 NOT DETECTED NOT DETECTED Final   Respiratory Syncytial Virus NOT DETECTED NOT DETECTED Final   Bordetella pertussis NOT DETECTED NOT DETECTED Final   Chlamydophila pneumoniae NOT DETECTED NOT DETECTED Final   Mycoplasma pneumoniae NOT DETECTED NOT DETECTED Final      Studies:  Dg Chest Port 1 View  Result Date: 09/28/2016 CLINICAL DATA:  Acute chest syndrome. EXAM: PORTABLE CHEST 1 VIEW COMPARISON:  Radiograph of September 27, 2016. FINDINGS: Stable cardiomediastinal silhouette. Right internal jugular catheter is unchanged in position. No pneumothorax or pleural effusion is noted. Improved bilateral lung opacities are noted consistent with improving edema or pneumonia. Increased sclerosis is seen involving both humeral heads consistent with sickle cell disease. IMPRESSION: Improved bilateral lung opacities as described above. Electronically Signed   By: Lupita Raider, M.D.   On: 09/28/2016 07:56   Dg Chest Port 1 View  Result Date: 09/27/2016 CLINICAL DATA:  Hypoxia. EXAM: PORTABLE CHEST 1 VIEW COMPARISON:  Most recent radiograph 09/25/2016, chest CT 09/26/2016 FINDINGS: Tip of the right central line at the atrial caval junction. Lower lung volumes from prior exam. Bilateral airspace opacities have progressed from prior chest radiograph, however appears similar to recent CT. Mild cardiomegaly is unchanged. No pleural fluid or pneumothorax. IMPRESSION: Essentially stable bilateral  airspace opacities which may be pulmonary edema, pneumonia, or acute chest syndrome given history of sickle cell. Electronically Signed   By: Rubye Oaks M.D.   On: 09/27/2016 03:42    Assessment: 40 y.o. 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 His LDH and reticulocyte counts are dropping. His Hb is stable.There is no indication for repeat exchange transfusion at this point.  He tells me he has an appointment at [facility] 10/01/2016. I will copy Dr Trinna Balloon, his [facility] disease MD there, on Dr Gustavo Lah note. All these records of course will be available to [facility] through care everywhere. Accordingly I am not setting him up for follow-up with Dr Myna Hidalgo, who nevertheless will be available to the patient PRN  Please let me kow if I can be of further help at this point.   Lowella Dell, MD 09/28/2016  11:28 AM Medical Oncology and Hematology [facility] [address] [place], [place] [address] Tel. [phone]    Fax. [phone]

## 2016-09-28 NOTE — Progress Notes (Signed)
Pt taken off of nrb at appx 0100 and placed on hrnc at 8l . He is tolerating well. He maintained his stats throughout night  In the range of 96-100% . Pt appears to be doing better and denies any discomfort .lung sounds are dimished in the bases , however grossely coarse in upper lobes .

## 2016-09-28 NOTE — Progress Notes (Signed)
Patient ID: Barkley BoardsKoffi Ibanez, male   DOB: 04/26/1976, 40 y.o.   MRN: [id number] Subjective: Patient is doing better today. He is on 5L of oxygen by [place]. He was titrated off NRB. He has no fever or chills. No pain now. Has been depressed and wants to go home soon.   Objective:  Vital signs in last 24 hours:  Vitals:   09/28/16 0452 09/28/16 0600 09/28/16 0826 09/28/16 1245  BP: (!) 148/76  132/68 137/78  Pulse: 92  91 93  Resp: (!) 26  20 19   Temp: 98.2 F (36.8 C)  97.8 F (36.6 C) 97.4 F (36.3 C)  TempSrc: Axillary  Axillary Axillary  SpO2: 100% 100% 92% 94%  Weight:      Height:        Intake/Output from previous day:   Intake/Output Summary (Last 24 hours) at 09/28/16 1507 Last data filed at 09/28/16 1245  Gross per 24 hour  Intake              480 ml  Output              700 ml  Net             -220 ml    Physical Exam: General: Alert, awake, oriented x3, in no acute distress.  HEENT: [place]/AT PEERL, EOMI Neck: Trachea midline,  no masses, no thyromegal,y no JVD, no carotid bruit OROPHARYNX:  Moist, No exudate/ erythema/lesions.  Heart: Regular rate and rhythm, without murmurs, rubs, gallops, PMI non-displaced, no heaves or thrills on palpation.  Lungs: Clear to auscultation, no wheezing or rhonchi noted. No increased vocal fremitus resonant to percussion  Abdomen: Soft, nontender, nondistended, positive bowel sounds, no masses no hepatosplenomegaly noted..  Neuro: No focal neurological deficits noted cranial nerves II through XII grossly intact. DTRs 2+ bilaterally upper and lower extremities. Strength 5 out of 5 in bilateral upper and lower extremities. Musculoskeletal: No warm swelling or erythema around joints, no spinal tenderness noted. Psychiatric: Patient alert and oriented x3, good insight and cognition, good recent to remote recall. Lymph node survey: No cervical axillary or inguinal lymphadenopathy noted.  Lab Results:  Basic Metabolic Panel:    Component  Value Date/Time   NA 137 09/28/2016 0715   K 3.3 (L) 09/28/2016 0715   CL 97 (L) 09/28/2016 0715   CO2 36 (H) 09/28/2016 0715   BUN 13 09/28/2016 0715   CREATININE 1.07 09/28/2016 0715   GLUCOSE 149 (H) 09/28/2016 0715   CALCIUM 8.5 (L) 09/28/2016 0715   CBC:    Component Value Date/Time   WBC 13.4 (H) 09/28/2016 0715   HGB 7.8 (L) 09/28/2016 0715   HCT 22.8 (L) 09/28/2016 0715   PLT 79 (L) 09/28/2016 0715   MCV 84.1 09/28/2016 0715   NEUTROABS 10.8 (H) 09/28/2016 0715   LYMPHSABS 1.6 09/28/2016 0715   MONOABS 0.7 09/28/2016 0715   EOSABS 0.3 09/28/2016 0715   BASOSABS 0.0 09/28/2016 0715    Recent Results (from the past 240 hour(s))  MRSA PCR Screening     Status: None   Collection Time: 09/22/16 11:26 PM  Result Value Ref Range Status   MRSA by PCR NEGATIVE NEGATIVE Final    Comment:        The GeneXpert MRSA Assay (FDA approved for NASAL specimens only), is one component of a comprehensive MRSA colonization surveillance program. It is not intended to diagnose MRSA infection nor to guide or monitor treatment for MRSA infections.   Culture, blood (  Routine X 2) w Reflex to ID Panel     Status: None   Collection Time: 09/23/16 12:46 AM  Result Value Ref Range Status   Specimen Description BLOOD RIGHT HAND  Final   Special Requests IN PEDIATRIC BOTTLE 4CC  Final   Culture NO GROWTH 5 DAYS  Final   Report Status 09/28/2016 FINAL  Final  Culture, blood (Routine X 2) w Reflex to ID Panel     Status: None   Collection Time: 09/23/16 12:54 AM  Result Value Ref Range Status   Specimen Description BLOOD LEFT HAND  Final   Special Requests BOTTLES DRAWN AEROBIC AND ANAEROBIC 5CC  Final   Culture NO GROWTH 5 DAYS  Final   Report Status 09/28/2016 FINAL  Final  Urine culture     Status: None   Collection Time: 09/24/16 12:03 PM  Result Value Ref Range Status   Specimen Description URINE, CLEAN CATCH  Final   Special Requests NONE  Final   Culture NO GROWTH  Final    Report Status 09/25/2016 FINAL  Final  Respiratory Panel by PCR     Status: None   Collection Time: 09/24/16 10:47 PM  Result Value Ref Range Status   Adenovirus NOT DETECTED NOT DETECTED Final   Coronavirus 229E NOT DETECTED NOT DETECTED Final   Coronavirus HKU1 NOT DETECTED NOT DETECTED Final   Coronavirus NL63 NOT DETECTED NOT DETECTED Final   Coronavirus OC43 NOT DETECTED NOT DETECTED Final   Metapneumovirus NOT DETECTED NOT DETECTED Final   Rhinovirus / Enterovirus NOT DETECTED NOT DETECTED Final   Influenza A NOT DETECTED NOT DETECTED Final   Influenza B NOT DETECTED NOT DETECTED Final   Parainfluenza Virus 1 NOT DETECTED NOT DETECTED Final   Parainfluenza Virus 2 NOT DETECTED NOT DETECTED Final   Parainfluenza Virus 3 NOT DETECTED NOT DETECTED Final   Parainfluenza Virus 4 NOT DETECTED NOT DETECTED Final   Respiratory Syncytial Virus NOT DETECTED NOT DETECTED Final   Bordetella pertussis NOT DETECTED NOT DETECTED Final   Chlamydophila pneumoniae NOT DETECTED NOT DETECTED Final   Mycoplasma pneumoniae NOT DETECTED NOT DETECTED Final    Studies/Results: Dg Chest Port 1 View  Result Date: 09/28/2016 CLINICAL DATA:  Acute chest syndrome. EXAM: PORTABLE CHEST 1 VIEW COMPARISON:  Radiograph of September 27, 2016. FINDINGS: Stable cardiomediastinal silhouette. Right internal jugular catheter is unchanged in position. No pneumothorax or pleural effusion is noted. Improved bilateral lung opacities are noted consistent with improving edema or pneumonia. Increased sclerosis is seen involving both humeral heads consistent with sickle cell disease. IMPRESSION: Improved bilateral lung opacities as described above. Electronically Signed   By: Lupita RaiderJames  Green Jr, M.D.   On: 09/28/2016 07:56   Dg Chest Port 1 View  Result Date: 09/27/2016 CLINICAL DATA:  Hypoxia. EXAM: PORTABLE CHEST 1 VIEW COMPARISON:  Most recent radiograph 09/25/2016, chest CT 09/26/2016 FINDINGS: Tip of the right central line  at the atrial caval junction. Lower lung volumes from prior exam. Bilateral airspace opacities have progressed from prior chest radiograph, however appears similar to recent CT. Mild cardiomegaly is unchanged. No pleural fluid or pneumothorax. IMPRESSION: Essentially stable bilateral airspace opacities which may be pulmonary edema, pneumonia, or acute chest syndrome given history of sickle cell. Electronically Signed   By: Rubye OaksMelanie  Ehinger M.D.   On: 09/27/2016 03:42    Medications: Scheduled Meds: . carvedilol  25 mg Oral BID  . ceFEPime (MAXIPIME) IV  1 g Intravenous Q8H  . folic acid  2  mg Oral Daily  . insulin aspart  0-15 Units Subcutaneous TID WC  . insulin aspart  0-5 Units Subcutaneous QHS  . senna-docusate  1 tablet Oral BID  . vancomycin  1,000 mg Intravenous Q8H   Continuous Infusions: PRN Meds:.acetaminophen, hydrALAZINE, HYDROmorphone (DILAUDID) injection, levalbuterol, lidocaine (PF), oxyCODONE-acetaminophen **AND** oxyCODONE, polyethylene glycol, white petrolatum  Consultants:  Pulmonologist and Hematologist  Procedures:  None  Antibiotics:  IV Vancomycin and Cefepime  Assessment/Plan: Principal Problem:   Sickle cell pain crisis (HCC) Active Problems:   Chest pain   Type 2 diabetes mellitus without complication, without long-term current use of insulin (HCC)   Essential hypertension   Elevated LFTs   Sickle cell disease (HCC)   Elevated troponin   AKI (acute kidney injury) (HCC)   Sickle cell crisis (HCC)   Acute chest syndrome (HCC)   Acute hypoxemic respiratory failure (HCC)   HAP (hospital-acquired pneumonia)  1. Acute Hypoxic Respiratory Failure: Pulmonary Edema Vs ACS Vs Pneumonia -Much improved.  -Continue to titrate oxygen off - Patient is status post exchange transfusion.  - Discussed with pulmonary CCM, no need to transfer to ICU or stepdown at this time   2. Vaso-occlussive Sickle Cell Pain crisis:  -Pain has been improving, hypoxemia  persist. S/P exchange transfusion.  - Will continue IV Dilaudid 2 mg Q 3 H prn pain,  - Patient is off PCA - Continue per sickle cell protocol for pain control.  - Continue home Percocet (only takes 2-3 per day), IV Toradol contraindicated with troponin leak and AKI - Hold IV fluids due to worsening hypoxemia and vascular congestion.  - Diphenhydramine, ondansetron and bowel regimen as needed  3. Acute kidney injury: - Likely due to #1, dehydration - Resoled, serum creatinine now back to normal - Continue to monitor -replace potassium  4. NIDDM: -Holding home metformin and Januvia -Continue sliding scale insulin  5. Hypertension: - hold  amlodipine, continue on carvedilol - Continue to hold HCTZ, triamterene   Code Status: Full Code Family Communication: N/A Disposition Plan: Not yet ready for discharge  Arden Tinoco,LAWAL  If 7PM-7AM, please contact night-coverage.  09/28/2016, 3:07 PM  LOS: 6 days

## 2016-09-29 LAB — POCT I-STAT, CHEM 8
BUN: 17 mg/dL (ref 6–20)
Calcium, Ion: 1.18 mmol/L (ref 1.15–1.40)
Chloride: 100 mmol/L — ABNORMAL LOW (ref 101–111)
Creatinine, Ser: 1.2 mg/dL (ref 0.61–1.24)
Glucose, Bld: 183 mg/dL — ABNORMAL HIGH (ref 65–99)
HCT: 22 % — ABNORMAL LOW (ref 39.0–52.0)
Hemoglobin: 7.5 g/dL — ABNORMAL LOW (ref 13.0–17.0)
Potassium: 3.5 mmol/L (ref 3.5–5.1)
Sodium: 140 mmol/L (ref 135–145)
TCO2: 28 mmol/L (ref 0–100)

## 2016-09-29 LAB — COMPREHENSIVE METABOLIC PANEL WITH GFR
ALT: 75 U/L — ABNORMAL HIGH (ref 17–63)
AST: 64 U/L — ABNORMAL HIGH (ref 15–41)
Albumin: 2.6 g/dL — ABNORMAL LOW (ref 3.5–5.0)
Alkaline Phosphatase: 122 U/L (ref 38–126)
Anion gap: 13 (ref 5–15)
BUN: 15 mg/dL (ref 6–20)
CO2: 26 mmol/L (ref 22–32)
Calcium: 9 mg/dL (ref 8.9–10.3)
Chloride: 101 mmol/L (ref 101–111)
Creatinine, Ser: 1.1 mg/dL (ref 0.61–1.24)
GFR calc Af Amer: >60 mL/min
GFR calc non Af Amer: >60 mL/min
Glucose, Bld: 105 mg/dL — ABNORMAL HIGH (ref 65–99)
Potassium: 3.9 mmol/L (ref 3.5–5.1)
Sodium: 140 mmol/L (ref 135–145)
Total Bilirubin: 1.6 mg/dL — ABNORMAL HIGH (ref 0.3–1.2)
Total Protein: 6.6 g/dL (ref 6.5–8.1)

## 2016-09-29 LAB — CBC
HCT: 24.7 % — ABNORMAL LOW (ref 39.0–52.0)
Hemoglobin: 8.3 g/dL — ABNORMAL LOW (ref 13.0–17.0)
MCH: 27.6 pg (ref 26.0–34.0)
MCHC: 33.6 g/dL (ref 30.0–36.0)
MCV: 82.1 fL (ref 78.0–100.0)
Platelets: 162 10*3/uL (ref 150–400)
RBC: 3.01 MIL/uL — ABNORMAL LOW (ref 4.22–5.81)
RDW: 16.5 % — ABNORMAL HIGH (ref 11.5–15.5)
WBC: 14.7 10*3/uL — ABNORMAL HIGH (ref 4.0–10.5)

## 2016-09-29 LAB — GLUCOSE, CAPILLARY
Glucose-Capillary: 127 mg/dL — ABNORMAL HIGH (ref 65–99)
Glucose-Capillary: 129 mg/dL — ABNORMAL HIGH (ref 65–99)

## 2016-09-29 LAB — RETICULOCYTES
RBC.: 3.01 MIL/uL — ABNORMAL LOW (ref 4.22–5.81)
Retic Count, Absolute: 159.5 10*3/uL (ref 19.0–186.0)
Retic Ct Pct: 5.3 % — ABNORMAL HIGH (ref 0.4–3.1)

## 2016-09-29 MED ORDER — LEVOFLOXACIN 750 MG PO TABS: 750.0000 mg | ORAL_TABLET | Freq: Every day | ORAL | 0 refills | Status: DC

## 2016-09-29 NOTE — Progress Notes (Addendum)
Pt ambulated in the hall with nurse tech this morning. Pt denied feeling SOB.

## 2016-09-29 NOTE — Progress Notes (Signed)
Pt refusing his IV antibiotics this afternoon despite education on the importance of taking his antibiotics. Pt states " I need to go home." MD on call aware.

## 2016-09-29 NOTE — Progress Notes (Signed)
Pt refusing to wear oxygen. 02 sats mainly staying around 88-93%. Pt denies feeling SOB. Pt states " I wan't to go home." MD aware.  Will cont to monitor pt.

## 2016-09-29 NOTE — Progress Notes (Signed)
Pharmacy Antibiotic Note  Nicolas [name] is a 41 y.o. male here with with sickle cell crisis, on CT chest >> pneumonia versus acute chest syndrome. Pharmacy has been consulted for vancomycin and cefepime dosing. Today is day 6 of abx therapy. WBC remains elevated at 13.4, patient febrile with Tmax 102.2, renal function stable, all cultures are negative.  Plan: -Continue vancomycin 1000mg  IV q8h -Continue cefepime 1gm IV q8h -Monitor renal function, clinical progress, repeat vanc trough as needed -F/u antibiotic length of therapy   Height: 5\' 7"  (170.2 cm) Weight: 221 lb 9.6 oz (100.5 kg) IBW/kg (Calculated) : 66.1  Temp (24hrs), Avg:99.8 F (37.7 C), Min:97.4 F (36.3 C), Max:102.2 F (39 C)   Recent Labs Lab 09/24/16 1753 09/24/16 2055 09/25/16 0336 09/26/16 0546 09/26/16 1201 09/26/16 1418 09/27/16 0502 09/28/16 0715  WBC  --  15.1* 14.7* 12.2*  --   --  12.6* 13.4*  CREATININE  --   --  1.32* 1.21  --  1.20 1.16 1.07  LATICACIDVEN 1.5 0.7  --   --   --   --   --   --   VANCOTROUGH  --   --   --   --  17  --   --   --     Estimated Creatinine Clearance: 101.6 mL/min (by C-G formula based on SCr of 1.07 mg/dL).     Antimicrobials this admission: 12/27 cefepime>> 12/27 vanc>>  Dose adjustments this admission: 12/29 VT 17 - continue 1g q8h  Microbiology results: 12/25 MRSA PCR: neg 12/26 BCx: NGF 12/27 UCx: NGF 12/27 Resp panel: neg   Nicolas [name], PharmD PGY1 Pharmacy Resident Pager: [phone] 09/29/2016 12:15 PM

## 2016-09-29 NOTE — Discharge Summary (Signed)
Physician Discharge Summary  Patient ID: Nicolas [name] MRN: [id number] DOB/AGE: May 17, 1976 41 y.o.  Admit date: 09/22/2016 Discharge date: 09/29/2016  Admission Diagnoses:  Discharge Diagnoses:  Principal Problem:   Sickle cell pain crisis [facility]) Active Problems:   Chest pain   Type 2 diabetes mellitus without complication, without long-term current use of insulin (HCC)   Essential hypertension   Elevated LFTs   Sickle cell disease (HCC)   Elevated troponin   AKI (acute kidney injury) ([place])   Sickle cell crisis ([place])   Acute chest syndrome (HCC)   Acute hypoxemic respiratory failure (HCC)   HAP (hospital-acquired pneumonia)   Discharged Condition: good  Hospital Course: patient was admitted with bilateral knee pain suspected to be sickle cell crisis. He R developed respiratory failure with significant shortness of breath and pulmonary edema. acute chest syndrome was diagnosed. Patient received exchange transfusion. He was placed on BiPAP initially and aggressively diuresed. He was also treated for healthcare associated pneumonia with vancomycin cefepime and Levaquin. So far patient has improved tremendously and has been titrated off oxygen. His saturation is borderline at the moment in the 90s. He is also still having low-grade temperature. Patient wife however are worried about not having anyone at home. He wants to be discharged on oral antibiotics with follow up with his PCP. He has an appointment tomorrow. He'll complete Levaquin for another 10 days. His pain was treated with IV Dilaudid. At this point his back to baseline..  Consults: hematology/oncology  Significant Diagnostic Studies: labs: CBcs, CMPs and transfusion studies  Treatments: antibiotics: vancomycin and Levaquin, analgesia: Dilaudid and exchange blood transfusion  Discharge Exam: Blood pressure 128/66, pulse 90, temperature (!) 101.8 F (38.8 C), temperature source Oral, resp. rate (!) 22, height '5\' 7"'  (1.702 m),  weight 100.5 kg (221 lb 9.6 oz), SpO2 92 %. General appearance: alert, cooperative and no distress Neck: no adenopathy, no carotid bruit, no JVD, supple, symmetrical, trachea midline and thyroid not enlarged, symmetric, no tenderness/mass/nodules Back: symmetric, no curvature. ROM normal. No CVA tenderness. Resp: clear to auscultation bilaterally Chest wall: no tenderness Cardio: regular rate and rhythm, S1, S2 normal, no murmur, click, rub or gallop GI: soft, non-tender; bowel sounds normal; no masses,  no organomegaly Extremities: extremities normal, atraumatic, no cyanosis or edema Pulses: 2+ and symmetric Skin: Skin color, texture, turgor normal. No rashes or lesions Neurologic: Grossly normal  Disposition: 01-Home or Self Care  Discharge Instructions    Call MD for:  temperature >100.4    Complete by:  As directed    Diet - low sodium heart healthy    Complete by:  As directed    Increase activity slowly    Complete by:  As directed      Allergies as of 09/29/2016   No Known Allergies     Medication List    TAKE these medications   accu-chek multiclix lancets Use as instructed What changed:  how much to take  how to take this  when to take this  additional instructions   amLODipine 10 MG tablet Commonly known as:  NORVASC Take 10 mg by mouth daily.   carvedilol 25 MG tablet Commonly known as:  COREG Take 25 mg by mouth 2 (two) times daily.   folic acid 1 MG tablet Commonly known as:  FOLVITE Take 1 mg by mouth daily.   glucose blood test strip Use as instructed What changed:  how much to take  how to take this  when to take this  additional instructions   glucose monitoring kit monitoring kit Glucometer- ACCUCHECK BRAND   JANUVIA 100 MG tablet Generic drug:  sitaGLIPtin Take 100 mg by mouth daily.   levofloxacin 750 MG tablet Commonly known as:  LEVAQUIN Take 1 tablet (750 mg total) by mouth daily.   lisinopril 5 MG tablet Commonly  known as:  PRINIVIL,ZESTRIL Take 5 mg by mouth daily.   meloxicam 15 MG tablet Commonly known as:  MOBIC Take 15 mg by mouth daily as needed for pain.   metFORMIN 500 MG tablet Commonly known as:  GLUCOPHAGE Take 500 mg by mouth 2 (two) times daily with a meal.   orphenadrine 100 MG tablet Commonly known as:  NORFLEX Take 1 tablet (100 mg total) by mouth 2 (two) times daily.   oxyCODONE-acetaminophen 7.5-325 MG tablet Commonly known as:  PERCOCET Take 1 tablet by mouth every 4 (four) hours as needed for severe pain.   triamterene-hydrochlorothiazide 37.5-25 MG tablet Commonly known as:  MAXZIDE-25 Take 1 tablet by mouth daily.   Vitamin D3 2000 units capsule Take 2,000 Units by mouth daily.        SignedBarbette Merino 09/29/2016, 2:41 PM   Time spent 30 minutes

## 2016-09-29 NOTE — Discharge Instructions (Signed)
Chest Wall Pain Introduction Chest wall pain is pain in or around the bones and muscles of your chest. Sometimes, an injury causes this pain. Sometimes, the cause may not be known. This pain may take several weeks or longer to get better. Follow these instructions at home: Pay attention to any changes in your symptoms. Take these actions to help with your pain:  Rest as told by your doctor.  Avoid activities that cause pain. Try not to use your chest, belly (abdominal), or side muscles to lift heavy things.  If directed, apply ice to the painful area:  Put ice in a plastic bag.  Place a towel between your skin and the bag.  Leave the ice on for 20 minutes, 2-3 times per day.  Take over-the-counter and prescription medicines only as told by your doctor.  Do not use tobacco products, including cigarettes, chewing tobacco, and e-cigarettes. If you need help quitting, ask your doctor.  Keep all follow-up visits as told by your doctor. This is important. Contact a doctor if:  You have a fever.  Your chest pain gets worse.  You have new symptoms. Get help right away if:  You feel sick to your stomach (nauseous) or you throw up (vomit).  You feel sweaty or light-headed.  You have a cough with phlegm (sputum) or you cough up blood.  You are short of breath. This information is not intended to replace advice given to you by your health care provider. Make sure you discuss any questions you have with your health care provider. Document Released: 03/03/2008 Document Revised: 02/21/2016 Document Reviewed: 12/11/2014  2017 Elsevier   Chest Wall Pain Chest wall pain is pain in or around the bones and muscles of your chest. Sometimes, an injury causes this pain. Sometimes, the cause may not be known. This pain may take several weeks or longer to get better. Follow these instructions at home: Pay attention to any changes in your symptoms. Take these actions to help with your  pain:  Rest as told by your health care provider.  Avoid activities that cause pain. These include any activities that use your chest muscles or your abdominal and side muscles to lift heavy items.  If directed, apply ice to the painful area:  Put ice in a plastic bag.  Place a towel between your skin and the bag.  Leave the ice on for 20 minutes, 2-3 times per day.  Take over-the-counter and prescription medicines only as told by your health care provider.  Do not use tobacco products, including cigarettes, chewing tobacco, and e-cigarettes. If you need help quitting, ask your health care provider.  Keep all follow-up visits as told by your health care provider. This is important. Contact a health care provider if:  You have a fever.  Your chest pain becomes worse.  You have new symptoms. Get help right away if:  You have nausea or vomiting.  You feel sweaty or light-headed.  You have a cough with phlegm (sputum) or you cough up blood.  You develop shortness of breath. This information is not intended to replace advice given to you by your health care provider. Make sure you discuss any questions you have with your health care provider. Document Released: 09/15/2005 Document Revised: 01/24/2016 Document Reviewed: 12/11/2014 Elsevier Interactive Patient Education  2017 [employer]

## 2016-09-29 NOTE — Progress Notes (Signed)
Pt's oral temp 101.8, prn Tylenol given. MD on call made aware. Pt still states he wants to go home. Pt states " I am fine and want to go home. I know my body and I am ready for discharge." Pt's 02 sat's sustaining in the low 90's and occasionally will drop down into the 80's but will come back up to the low 90's on room air. Pt denies feeling SOB. MD on call aware. MD called pt and pt's wife. Will cont to monitor pt.

## 2016-09-29 NOTE — Progress Notes (Signed)
[name] is feeling much better. The exchange helped. His oxygen saturation is doing better. Unfortunately, there was no lab work done yet today. I will have to order this. He really wants to go home today. He has a temperature of 100.3. I am not sure why he has the temperature. His MAXIMUM TEMPERATURE yesterday was 102.2. No cultures were done. He is still on IV antibiotics. He is on Maxipime and vancomycin.  He is not complaining of any pain. There is no chest wall pain. A chest x-ray done yesterday showed improved bilateral infiltrates.  On his physical exam, his lungs sound relatively clear. He has decent breath sounds bilaterally. He has good air movement. Cardiac exam regular rate and rhythm with no murmurs. Abdomen is soft. Extremities shows no clubbing, cyanosis or edema.  Again he really wants to go home today. However, I'm just worried about this temperature. He probably needs to stay another day. It would be nice to see what his labs are.  I very much appreciate all the great care that he is getting from everybody up on [address].  Christin BachPete [name]  Phillipians 3:15-17

## 2016-09-29 NOTE — Progress Notes (Signed)
Discharge instructions reviewed with patient. Patient has no questions at this time. IV d/c. HD cath removed by IV team, dressing clean dry and intact. Pt states he has a dr appointment tomorrow with his PCP. Pt wheeled out and being discharged home with wife and kids.

## 2016-09-29 NOTE — Progress Notes (Signed)
Pt's 02 sat 100% 2l of 02 via nasal cannula while resting in bed. Pt taken off oxygen while resting in bed 02 sat 84-92% RA. Pt denied feeling sob. Pt placed back on 2L of 02 via nasal cannula. 02 sat 99% 2L of 02.

## 2016-09-29 NOTE — Progress Notes (Signed)
Name: Nicolas [name] MRN: [id number] DOB: 09/20/1976    ADMISSION DATE:  09/22/2016 CONSULTATION DATE:  09/24/2016  REFERRING MD :  Dr. Sunnie Nielsenegalado   CHIEF COMPLAINT:  Hypoxia   Brief 41 yo male admitted with severe knee, lower back and chest pain sickle cell crisis.  Developed fever, hypoxia, and PCCM consulted.  PMH of sickle cell disease ([facility]), DM, depression, and HTN.  SIGNIFICANT EVENTS  12/25 > Presents ED with Chest, Knee, and Back Pain  12/27 > New Hypoxia and Leukocytosis with CXR showing pulmonary vascular congestion  12/29 > VasCath placed for red cell exchange  12/30 > exchange transfusion  STUDIES:  CTA 12/25 > Neg PE, Normal heart and no pericardial effusion  Echo 12/26 > EF 55-60, mild concentric hypertrophy  BLE dopplers 12/28 > Neg DVT  CTA 12/29 > b/l patchy ASD  Cultures:  Blood 12/26 > Urine 12/27 > Neg  RVP 12/27 > Neg   Antibiotics:  Vancomycin 12/27 > Cefepime 12/27 >   SUBJECTIVE:  Doing very well.  Has been on RA for reportedly past 4 hours or so.  Pt states he has also ambulated in hallway without desaturation (he is very eager to be discharged).   Denies any SOB, cough.  Pain well controlled.   VITAL SIGNS: Temp:  [97.4 F (36.3 C)-102.2 F (39 C)] 100.2 F (37.9 C) (01/01 0805) Pulse Rate:  [91-97] 91 (01/01 0805) Resp:  [15-21] 19 (01/01 0805) BP: (132-145)/(62-86) 134/79 (01/01 0807) SpO2:  [93 %-98 %] 98 % (01/01 0805) Weight:  [221 lb 9.6 oz (100.5 kg)] 221 lb 9.6 oz (100.5 kg) (01/01 0630)  PHYSICAL EXAMINATION: General: Adult male, no acute distress, eager to be discharged Neuro: alert, follows commands, oriented x 4  HEENT: normocephalic  Cardiovascular: no MRG, Tachy, NI S1/S2 Lungs: unlabored, clear bilaterally Abdomen: non-tender, non-distended, active bowel sounds  Musculoskeletal: no deformities  Skin: Warm, dry, intact     Recent Labs Lab 09/26/16 0546 09/26/16 1418 09/27/16 0502  09/28/16 0715  NA 137 140 140 137  K 3.5 3.5 3.2* 3.3*  CL 103 100* 101 97*  CO2 26  --  29 36*  BUN 19 17 13 13   CREATININE 1.21 1.20 1.16 1.07  GLUCOSE 199* 183* 150* 149*    Recent Labs Lab 09/26/16 0546 09/26/16 1418 09/27/16 0502 09/28/16 0715  HGB 7.8* 7.5* 8.5* 7.8*  HCT 21.5* 22.0* 24.8* 22.8*  WBC 12.2*  --  12.6* 13.4*  PLT 69*  --  53* 79*   Dg Chest Port 1 View  Result Date: 09/28/2016 CLINICAL DATA:  Acute chest syndrome. EXAM: PORTABLE CHEST 1 VIEW COMPARISON:  Radiograph of September 27, 2016. FINDINGS: Stable cardiomediastinal silhouette. Right internal jugular catheter is unchanged in position. No pneumothorax or pleural effusion is noted. Improved bilateral lung opacities are noted consistent with improving edema or pneumonia. Increased sclerosis is seen involving both humeral heads consistent with sickle cell disease. IMPRESSION: Improved bilateral lung opacities as described above. Electronically Signed   By: Lupita RaiderJames  Green Jr, M.D.   On: 09/28/2016 07:56    ASSESSMENT / PLAN:  Acute Hypoxic Respiratory Failure with patchy b/l ASD on CT chest >> pneumonia versus acute chest syndrome.  S/p exchange transfusion 12/30 - responded very well.  O2 needs resolved, now on room air as of 09/29/16. - no role repeat exchange transfusion at this point. - Abx per primary team - transition to PO. - pt has f/u with primary hematologist  Dr. Jolyne Loa at [facility] 10/01/16.  Sickle cell pain crisis with hemoglobin . - pain control per primary team, now off PCA  Rest per primary team.   Rutherford Guys, PA - [facility] Pager: [phone]  or [phone] 09/29/2016, 11:14 AM  Attending Note:  I have examined patient, reviewed labs, studies and notes. I have discussed the case with Ihor Dow, and I agree with the data and plans as amended above. Looks much better. Off BiPAP and O2 weaned to RA. Likely home today. No new interventions from my  standpoint.    Levy Pupa, MD, PhD 09/29/2016, 4:13 PM [facility] [phone] or if no answer [phone]

## 2016-10-01 DIAGNOSIS — E559 Vitamin D deficiency, unspecified: Secondary | ICD-10-CM | POA: Diagnosis not present

## 2016-10-01 DIAGNOSIS — E1165 Type 2 diabetes mellitus with hyperglycemia: Secondary | ICD-10-CM | POA: Diagnosis not present

## 2016-10-01 DIAGNOSIS — I10 Essential (primary) hypertension: Secondary | ICD-10-CM | POA: Diagnosis not present

## 2016-10-01 DIAGNOSIS — D571 Sickle-cell disease without crisis: Secondary | ICD-10-CM | POA: Diagnosis not present

## 2016-10-01 DIAGNOSIS — Z0000 Encounter for general adult medical examination without abnormal findings: Secondary | ICD-10-CM | POA: Diagnosis not present

## 2016-10-29 DIAGNOSIS — D571 Sickle-cell disease without crisis: Secondary | ICD-10-CM | POA: Diagnosis not present

## 2016-12-25 DIAGNOSIS — Z79899 Other long term (current) drug therapy: Secondary | ICD-10-CM | POA: Diagnosis not present

## 2016-12-25 DIAGNOSIS — M8700 Idiopathic aseptic necrosis of unspecified bone: Secondary | ICD-10-CM | POA: Diagnosis not present

## 2016-12-25 DIAGNOSIS — H3520 Other non-diabetic proliferative retinopathy, unspecified eye: Secondary | ICD-10-CM | POA: Diagnosis not present

## 2016-12-25 DIAGNOSIS — G8929 Other chronic pain: Secondary | ICD-10-CM | POA: Diagnosis not present

## 2016-12-25 DIAGNOSIS — D5720 Sickle-cell/Hb-C disease without crisis: Secondary | ICD-10-CM | POA: Diagnosis not present

## 2016-12-25 DIAGNOSIS — M879 Osteonecrosis, unspecified: Secondary | ICD-10-CM | POA: Diagnosis not present

## 2016-12-25 DIAGNOSIS — E119 Type 2 diabetes mellitus without complications: Secondary | ICD-10-CM | POA: Diagnosis not present

## 2016-12-25 DIAGNOSIS — Z7984 Long term (current) use of oral hypoglycemic drugs: Secondary | ICD-10-CM | POA: Diagnosis not present

## 2016-12-25 DIAGNOSIS — I10 Essential (primary) hypertension: Secondary | ICD-10-CM | POA: Diagnosis not present

## 2016-12-25 DIAGNOSIS — E669 Obesity, unspecified: Secondary | ICD-10-CM | POA: Diagnosis not present

## 2016-12-25 DIAGNOSIS — E559 Vitamin D deficiency, unspecified: Secondary | ICD-10-CM | POA: Diagnosis not present

## 2016-12-30 DIAGNOSIS — I119 Hypertensive heart disease without heart failure: Secondary | ICD-10-CM | POA: Diagnosis not present

## 2016-12-31 DIAGNOSIS — L731 Pseudofolliculitis barbae: Secondary | ICD-10-CM | POA: Diagnosis not present

## 2017-01-06 DIAGNOSIS — H538 Other visual disturbances: Secondary | ICD-10-CM | POA: Diagnosis not present

## 2017-01-06 DIAGNOSIS — H1011 Acute atopic conjunctivitis, right eye: Secondary | ICD-10-CM | POA: Diagnosis not present

## 2017-01-06 DIAGNOSIS — H5203 Hypermetropia, bilateral: Secondary | ICD-10-CM | POA: Diagnosis not present

## 2017-01-06 DIAGNOSIS — E119 Type 2 diabetes mellitus without complications: Secondary | ICD-10-CM | POA: Diagnosis not present

## 2017-01-07 DIAGNOSIS — E1165 Type 2 diabetes mellitus with hyperglycemia: Secondary | ICD-10-CM | POA: Diagnosis not present

## 2017-01-07 DIAGNOSIS — Z6835 Body mass index (BMI) 35.0-35.9, adult: Secondary | ICD-10-CM | POA: Diagnosis not present

## 2017-01-07 DIAGNOSIS — I10 Essential (primary) hypertension: Secondary | ICD-10-CM | POA: Diagnosis not present

## 2017-01-07 DIAGNOSIS — L309 Dermatitis, unspecified: Secondary | ICD-10-CM | POA: Diagnosis not present

## 2017-01-13 DIAGNOSIS — I119 Hypertensive heart disease without heart failure: Secondary | ICD-10-CM | POA: Diagnosis not present

## 2017-01-13 DIAGNOSIS — E65 Localized adiposity: Secondary | ICD-10-CM | POA: Diagnosis not present

## 2017-06-25 ENCOUNTER — Encounter (HOSPITAL_COMMUNITY): Payer: Self-pay | Admitting: Emergency Medicine

## 2017-06-25 ENCOUNTER — Emergency Department (HOSPITAL_COMMUNITY)
Admission: EM | Admit: 2017-06-25 | Discharge: 2017-06-25 | Disposition: A | Discharge status: Home / Self Care | Payer: Medicare HMO | Attending: Emergency Medicine | Admitting: Emergency Medicine

## 2017-06-25 DIAGNOSIS — I10 Essential (primary) hypertension: Secondary | ICD-10-CM | POA: Insufficient documentation

## 2017-06-25 DIAGNOSIS — K0889 Other specified disorders of teeth and supporting structures: Secondary | ICD-10-CM | POA: Diagnosis not present

## 2017-06-25 DIAGNOSIS — Z7984 Long term (current) use of oral hypoglycemic drugs: Secondary | ICD-10-CM | POA: Insufficient documentation

## 2017-06-25 DIAGNOSIS — Z79899 Other long term (current) drug therapy: Secondary | ICD-10-CM | POA: Diagnosis not present

## 2017-06-25 DIAGNOSIS — E119 Type 2 diabetes mellitus without complications: Secondary | ICD-10-CM | POA: Diagnosis not present

## 2017-06-25 MED ORDER — NAPROXEN 500 MG PO TABS: 500.0000 mg | ORAL_TABLET | Freq: Two times a day (BID) | ORAL | 0 refills | Status: DC

## 2017-06-25 MED ORDER — PENICILLIN V POTASSIUM 500 MG PO TABS: 500.0000 mg | ORAL_TABLET | Freq: Four times a day (QID) | ORAL | 0 refills | Status: DC

## 2017-06-25 MED ORDER — NAPROXEN 250 MG PO TABS
500.0000 mg | ORAL_TABLET | Freq: Once | ORAL | Status: AC
  Administered 2017-06-25: 500 mg via ORAL
  Filled 2017-06-25: qty 2

## 2017-06-25 MED ORDER — PENICILLIN V POTASSIUM 250 MG PO TABS
500.0000 mg | ORAL_TABLET | Freq: Once | ORAL | Status: AC
  Administered 2017-06-25: 500 mg via ORAL
  Filled 2017-06-25: qty 2

## 2017-06-25 NOTE — ED Triage Notes (Signed)
Pt states he has a crown on his right lower molar. Pt here due to pain at the site.

## 2017-06-25 NOTE — ED Provider Notes (Signed)
[facility] DEPT Provider Note   CSN: [id number] Arrival date & time: 06/25/17  1345     History   Chief Complaint Chief Complaint  Patient presents with  . Dental Pain    HPI Nicolas [name] is a 41 y.o. male.  The history is provided by medical records.  Dental Pain      41 year old male with history of arthritis, depression, diabetes, hypertension, sickle cell anemia, presenting to the ED with right lower dental pain. Patient reports he has a crown of his right lower molar and part of it broke off a few days ago. He went saw his dentist and was told he needed a new crown but it would cost $1300. States he does not have the money for this at this time. States he has started noticing some swelling of the gums and increased pain. No fever or chills. States he plans to have it didn't work done, but needs something to "hold him over" until he can afford to have this done.  Past Medical History:  Diagnosis Date  . Arthritis    arthritis- back & L hip  . Depression    situational depression, pt. out of work   . Diabetes mellitus ([place])   . HTN (hypertension) 05/10/2015  . Osteoarthritis of left hip 06/07/2012  . Sickle cell anemia [facility])     Patient Active Problem List   Diagnosis Date Noted  . Acute chest syndrome ([place])   . Acute hypoxemic respiratory failure ([place])   . HAP (hospital-acquired pneumonia)   . Sickle cell disease ([place]) 09/22/2016  . Sickle cell pain crisis ([place]) 09/22/2016  . Elevated troponin 09/22/2016  . AKI (acute kidney injury) ([place]) 09/22/2016  . Sickle cell crisis ([place]) 09/22/2016  . Anemia 05/11/2015  . Plasmodium falciparum malaria   . 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 Type 2 diabetes mellitus without complication, without long-term current use of insulin ([place]) 05/10/2015  . Hypokalemia 05/10/2015  . Dehydration 05/10/2015  . Leukocytosis 05/10/2015  . Essential hypertension 05/10/2015  . Elevated LFTs 05/10/2015  . Chest pain  11/03/2013  . DKA, type 2 ([place]) 08/13/2013  . Postoperative anemia due to acute blood loss 06/08/2012  . Osteoarthritis of left hip 06/07/2012    Past Surgical History:  Procedure Laterality Date  . IR GENERIC HISTORICAL  09/26/2016   IR FLUORO GUIDE CV LINE RIGHT 09/26/2016 Aletta Edouard, MD [facility]-INTER[facility] RAD  . IR GENERIC HISTORICAL  09/26/2016   IR US GUIDE VASC ACCESS RIGHT 09/26/2016 Aletta Edouard, MD [facility]INTER[facility]  . JOINT REPLACEMENT     R hip  . TOTAL HIP ARTHROPLASTY  06/07/2012   Procedure: TOTAL HIP ARTHROPLASTY;  Surgeon: Johnny Bridge, MD;  Location: [facility];  Service: Orthopedics;  Laterality: Left;       Home Medications    Prior to Admission medications   Medication Sig Start Date End Date Taking? Authorizing Provider  amLODipine (NORVASC) 10 MG tablet Take 10 mg by mouth daily. 11/25/14   [provider]  carvedilol (COREG) 25 MG tablet Take 25 mg by mouth 2 (two) times daily. 01/07/16   [provider]  Cholecalciferol (VITAMIN D3) 2000 units capsule Take 2,000 Units by mouth daily. 02/05/16   [provider]  folic acid (FOLVITE) 1 MG tablet Take 1 mg by mouth daily. 12/23/14   [provider]  glucose blood test strip Use as instructed Patient taking differently: 1 each by Other route every other day. Use as instructed  08/15/13   Viyuoh, Alison Stalling, MD  glucose monitoring kit (FREESTYLE) monitoring kit Glucometer- ACCUCHECK BRAND 08/15/13   Viyuoh, Adeline C, MD  JANUVIA 100 MG tablet Take 100 mg by mouth daily. 02/08/15   [provider]  Lancets (ACCU-CHEK MULTICLIX) lancets Use as instructed Patient taking differently: 1 each by Other route every other day.  08/15/13   Viyuoh, Alison Stalling, MD  levofloxacin (LEVAQUIN) 750 MG tablet Take 1 tablet (750 mg total) by mouth daily. 09/29/16   Elwyn Reach, MD  lisinopril (PRINIVIL,ZESTRIL) 5 MG tablet Take 5 mg by mouth daily. 04/17/15   [provider]  meloxicam (MOBIC)  15 MG tablet Take 15 mg by mouth daily as needed for pain.  01/29/16   [provider]  metFORMIN (GLUCOPHAGE) 500 MG tablet Take 500 mg by mouth 2 (two) times daily with a meal.    [provider]  orphenadrine (NORFLEX) 100 MG tablet Take 1 tablet (100 mg total) by mouth 2 (two) times daily. 01/11/15   Charlesetta Shanks, MD  oxyCODONE-acetaminophen (PERCOCET) 7.5-325 MG tablet Take 1 tablet by mouth every 4 (four) hours as needed for severe pain.    [provider]  triamterene-hydrochlorothiazide (MAXZIDE-25) 37.5-25 MG per tablet Take 1 tablet by mouth daily. 12/30/14   [provider]    Family History Family History  Problem Relation Age of Onset  . Cancer - Other Father   . CAD Neg Hx     Social History Social History  Substance Use Topics  . Smoking status: Never Smoker  . Smokeless tobacco: Never Used  . Alcohol use 0.5 oz/week    1 Standard drinks or equivalent per week     Allergies   Patient has no known allergies.   Review of Systems Review of Systems  HENT: Positive for dental problem.   All other systems reviewed and are negative.    Physical Exam Updated Vital Signs BP (!) 157/85 (BP Location: Right Arm)   Pulse 83   Temp 98.7 F (37.1 C) (Oral)   Resp 18   SpO2 96%   Physical Exam  Constitutional: He is oriented to person, place, and time. He appears well-developed and well-nourished.  HENT:  Head: Normocephalic and atraumatic.  Mouth/Throat: Oropharynx is clear and moist.  Teeth largely in fair dentition, right lower 3rd molar with partially broken crown, surrounding gingiva mildly swollen and appears irritated, tooth is tender to the touch, handling secretions appropriately, no trismus, no facial or neck swelling, normal phonation without stridor  Eyes: Pupils are equal, round, and reactive to light. Conjunctivae and EOM are normal.  Neck: Normal range of motion.  Cardiovascular: Normal rate, regular rhythm and normal  heart sounds.   Pulmonary/Chest: Effort normal and breath sounds normal. No respiratory distress. He has no wheezes.  Abdominal: Soft. Bowel sounds are normal. There is no tenderness. There is no rebound.  Musculoskeletal: Normal range of motion.  Neurological: He is alert and oriented to person, place, and time.  Skin: Skin is warm and dry.  Psychiatric: He has a normal mood and affect.  Nursing note and vitals reviewed.    ED Treatments / Results  Labs (all labs ordered are listed, but only abnormal results are displayed) Labs Reviewed - No data to display  EKG  EKG Interpretation None       Radiology No results found.  Procedures Procedures (including critical care time)  Medications Ordered in ED Medications - No data to display  Initial Impression / Assessment and Plan / ED Course  I have reviewed the triage vital signs and the nursing notes.  Pertinent labs & imaging results that were available during my care of the patient were reviewed by me and considered in my medical decision making (see chart for details).  41 year old male here with right lower dental pain. Had a crown placed about 8 years ago and it broke recently. Has seen his dentist, but new crown placement will be $1300 and he cannot afford it at this time. Started to have some gingival swelling. This is present on exam but there is no obvious abscess or drainable fluid collection. No facial or neck swelling. No clinical signs or symptoms concerning for Ludwig's angina.  Will start on antibiotics, anti-inflammatories. Will have him follow-up closely with his dentist. He was given dental resource guide to see if this possibly could be done cheaper at another local clinic.  Discussed plan with patient, he acknowledged understanding and agreed with plan of care.  Return precautions given for new or worsening symptoms.  Final Clinical Impressions(s) / ED Diagnoses   Final diagnoses:  Pain, dental    New  Prescriptions New Prescriptions   NAPROXEN (NAPROSYN) 500 MG TABLET    Take 1 tablet (500 mg total) by mouth 2 (two) times daily with a meal.   PENICILLIN V POTASSIUM (VEETID) 500 MG TABLET    Take 1 tablet (500 mg total) by mouth 4 (four) times daily.     Larene Pickett, PA-C 06/25/17 1645    Carmin Muskrat, MD 06/25/17 [phone]

## 2017-06-25 NOTE — ED Notes (Signed)
See edp assessment 

## 2017-06-25 NOTE — Discharge Instructions (Signed)
Take the prescribed medication as directed. Follow-up with dentist-- if you want to try to see if you can get crown replaced for cheaper, try calling some of the clinics I have listed on the back. Return to the ED for new or worsening symptoms.

## 2017-10-02 DIAGNOSIS — I119 Hypertensive heart disease without heart failure: Secondary | ICD-10-CM | POA: Diagnosis not present

## 2017-11-26 DIAGNOSIS — E1165 Type 2 diabetes mellitus with hyperglycemia: Secondary | ICD-10-CM | POA: Diagnosis not present

## 2017-11-26 DIAGNOSIS — I10 Essential (primary) hypertension: Secondary | ICD-10-CM | POA: Diagnosis not present

## 2017-11-26 DIAGNOSIS — Z79899 Other long term (current) drug therapy: Secondary | ICD-10-CM | POA: Diagnosis not present

## 2017-11-26 DIAGNOSIS — M545 Low back pain: Secondary | ICD-10-CM | POA: Diagnosis not present

## 2017-11-26 DIAGNOSIS — E785 Hyperlipidemia, unspecified: Secondary | ICD-10-CM | POA: Diagnosis not present

## 2017-12-10 DIAGNOSIS — E1165 Type 2 diabetes mellitus with hyperglycemia: Secondary | ICD-10-CM | POA: Diagnosis not present

## 2018-01-18 DIAGNOSIS — H538 Other visual disturbances: Secondary | ICD-10-CM | POA: Diagnosis not present

## 2018-02-25 LAB — LIPID PANEL
Cholesterol: 147 (ref 0–200)
HDL: 43 (ref 35–70)
LDL Cholesterol: 82
LDl/HDL Ratio: 1.9
Triglycerides: 110 (ref 40–160)

## 2018-02-25 LAB — BASIC METABOLIC PANEL
BUN: 16 (ref 4–21)
Creatinine: 1.3 (ref 0.6–1.3)
Glucose: 98
Potassium: 3.8 (ref 3.4–5.3)
Sodium: 141 (ref 137–147)

## 2018-02-25 LAB — HEPATIC FUNCTION PANEL
ALT: 29 (ref 10–40)
AST: 38 (ref 14–40)
Alkaline Phosphatase: 57 (ref 25–125)
Bilirubin, Total: 1.1

## 2018-06-26 ENCOUNTER — Encounter: Payer: Self-pay | Admitting: Internal Medicine

## 2018-06-26 DIAGNOSIS — Z6833 Body mass index (BMI) 33.0-33.9, adult: Secondary | ICD-10-CM

## 2018-07-01 ENCOUNTER — Ambulatory Visit (INDEPENDENT_AMBULATORY_CARE_PROVIDER_SITE_OTHER): Payer: Managed Care, Other (non HMO) | Admitting: Internal Medicine

## 2018-07-01 ENCOUNTER — Encounter: Payer: Self-pay | Admitting: Internal Medicine

## 2018-07-01 VITALS — BP 120/78 | HR 67 | Temp 98.4°F | Ht 67.0 in | Wt 219.6 lb

## 2018-07-01 DIAGNOSIS — N182 Chronic kidney disease, stage 2 (mild): Secondary | ICD-10-CM | POA: Diagnosis not present

## 2018-07-01 DIAGNOSIS — E1122 Type 2 diabetes mellitus with diabetic chronic kidney disease: Secondary | ICD-10-CM

## 2018-07-01 DIAGNOSIS — D5700 Hb-SS disease with crisis, unspecified: Secondary | ICD-10-CM

## 2018-07-01 DIAGNOSIS — I129 Hypertensive chronic kidney disease with stage 1 through stage 4 chronic kidney disease, or unspecified chronic kidney disease: Secondary | ICD-10-CM | POA: Diagnosis not present

## 2018-07-01 NOTE — Progress Notes (Addendum)
Subjective:     Patient ID: Nicolas [name], male   DOB: 07/28/1976, 42 y.o.   MRN: [id number] HPI  1- Pt is here for DM FU. Average glucose has been 98-110. Denies hypoglycemia episodes.    Denies burning or numbness of feet. Denies slow healing wounds. Denies wound in his feet.    2- Onset of back pain x 4 day which is part of how he responds when his sickle cell is in a flair. Oxycodone helps it. Sees his sickle cell MD regularly.    Past Medical History:  Diagnosis Date  . Arthritis    arthritis- back & L hip  . Depression    situational depression, pt. out of work   . Diabetes mellitus (HCC)   . HTN (hypertension) 05/10/2015  . Osteoarthritis of left hip 06/07/2012  . Sickle cell anemia (HCC)     Patient has no known allergies.  Outpatient Medications Prior to Visit  Medication Sig Dispense Refill  . amLODipine (NORVASC) 10 MG tablet Take 10 mg by mouth daily.  3  . carvedilol (COREG) 6.25 MG tablet Take 6.25 mg by mouth 2 (two) times daily with a meal.    . Cholecalciferol (VITAMIN D3) 2000 units capsule Take 2,000 Units by mouth daily.  5  . folic acid (FOLVITE) 1 MG tablet Take 1 mg by mouth daily.  11  . glucose blood test strip Use as instructed (Patient taking differently: 1 each by Other route every other day. Use as instructed) 100 each 12  . glucose monitoring kit (FREESTYLE) monitoring kit Glucometer- ACCUCHECK BRAND 1 each 0  . JANUVIA 100 MG tablet Take 100 mg by mouth daily.  0  . Lancets (ACCU-CHEK MULTICLIX) lancets Use as instructed (Patient taking differently: 1 each by Other route every other day. ) 100 each 12  . levofloxacin (LEVAQUIN) 750 MG tablet Take 1 tablet (750 mg total) by mouth daily. 10 tablet 0  . lisinopril (PRINIVIL,ZESTRIL) 5 MG tablet Take 5 mg by mouth daily.  6  . meloxicam (MOBIC) 15 MG tablet Take 15 mg by mouth daily as needed for pain.   1  . metFORMIN (GLUCOPHAGE) 500 MG tablet Take 500 mg by mouth 2 (two) times daily with a meal.    .  naproxen (NAPROSYN) 500 MG tablet Take 1 tablet (500 mg total) by mouth 2 (two) times daily with a meal. 30 tablet 0  . orphenadrine (NORFLEX) 100 MG tablet Take 1 tablet (100 mg total) by mouth 2 (two) times daily. 30 tablet 0  . oxyCODONE-acetaminophen (PERCOCET) 7.5-325 MG tablet Take 1 tablet by mouth every 4 (four) hours as needed for severe pain.    . penicillin v potassium (VEETID) 500 MG tablet Take 1 tablet (500 mg total) by mouth 4 (four) times daily. 40 tablet 0  . pravastatin (PRAVACHOL) 20 MG tablet Take 20 mg by mouth daily.    . triamterene-hydrochlorothiazide (MAXZIDE-25) 37.5-25 MG per tablet Take 1 tablet by mouth daily.  1  . carvedilol (COREG) 25 MG tablet Take 25 mg by mouth 2 (two) times daily.  6   No facility-administered medications prior to visit.      Review of Systems  Constitutional: Negative for diaphoresis.  HENT: Negative for nosebleeds, postnasal drip and rhinorrhea.   Respiratory: Negative for chest tightness and shortness of breath.   Cardiovascular: Negative for chest pain, palpitations and leg swelling.  Gastrointestinal: Negative for nausea and vomiting.  Endocrine: Negative for polyphagia and polyuria.  Genitourinary:   Negative for dysuria.  Skin: Negative for rash.  Neurological: Negative for numbness.       Objective:   Physical Exam Today's Vitals   07/01/18 1134  BP: 120/78  Pulse: 67  Temp: 98.4 F (36.9 C)  TempSrc: Oral  SpO2: 95%  Weight: 219 lb 9.6 oz (99.6 kg)   Body mass index is 34.39 kg/m.   Constitutional: She is oriented to person, place, and time. She appears well-developed and well-nourished. No distress.  HENT:  Head: Normocephalic and atraumatic.  Right Ear: External ear normal.  Left Ear: External ear normal.  Nose: Nose normal.  Eyes: Conjunctivae are normal. Right eye exhibits no discharge. Left eye exhibits no discharge. No scleral icterus.  Neck: Neck supple. No thyromegaly present.  No carotid bruits  bilaterally  Cardiovascular: Normal rate and regular rhythm.  No murmur heard. Pulmonary/Chest: Effort normal and breath sounds normal. No respiratory distress.  Musculoskeletal: Normal range of motion. She exhibits no edema.  Lymphadenopathy:    She has no cervical adenopathy.  Neurological: She is alert and oriented to person, place, and time.  Skin: Skin is warm and dry. Capillary refill takes less than 2 seconds. No rash noted. She is not diaphoretic.  Psychiatric: She has a normal mood and affect. Her behavior is normal. Judgment and thought content normal.  Nursing note reviewed.    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       Plan:    Labs ordered as noted. We will call him when the results are back. May continue current meds. Will continue care for his Sickle cell anemia with his specialist.  FU in 3 months.

## 2018-07-02 LAB — CBC
Hematocrit: 35.2 % — ABNORMAL LOW (ref 37.5–51.0)
Hemoglobin: 11.5 g/dL — ABNORMAL LOW (ref 13.0–17.7)
MCH: 27.6 pg (ref 26.6–33.0)
MCHC: 32.7 g/dL (ref 31.5–35.7)
MCV: 85 fL (ref 79–97)
NRBC: 1 % — ABNORMAL HIGH (ref 0–0)
Platelets: 355 10*3/uL (ref 150–450)
RBC: 4.16 x10E6/uL (ref 4.14–5.80)
RDW: 16.6 % — ABNORMAL HIGH (ref 12.3–15.4)
WBC: 6.5 10*3/uL (ref 3.4–10.8)

## 2018-07-02 LAB — CMP14 + ANION GAP
ALT: 24 IU/L (ref 0–44)
AST: 30 IU/L (ref 0–40)
Albumin/Globulin Ratio: 1.7 (ref 1.2–2.2)
Albumin: 4.6 g/dL (ref 3.5–5.5)
Alkaline Phosphatase: 50 IU/L (ref 39–117)
Anion Gap: 18 mmol/L (ref 10.0–18.0)
BUN/Creatinine Ratio: 11 (ref 9–20)
BUN: 15 mg/dL (ref 6–24)
Bilirubin Total: 0.8 mg/dL (ref 0.0–1.2)
CO2: 26 mmol/L (ref 20–29)
Calcium: 9.5 mg/dL (ref 8.7–10.2)
Chloride: 99 mmol/L (ref 96–106)
Creatinine, Ser: 1.41 mg/dL — ABNORMAL HIGH (ref 0.76–1.27)
GFR calc Af Amer: 70 mL/min/{1.73_m2} (ref >59)
GFR calc non Af Amer: 60 mL/min/{1.73_m2} (ref >59)
Globulin, Total: 2.7 g/dL (ref 1.5–4.5)
Glucose: 97 mg/dL (ref 65–99)
Potassium: 3.7 mmol/L (ref 3.5–5.2)
Sodium: 143 mmol/L (ref 134–144)
Total Protein: 7.3 g/dL (ref 6.0–8.5)

## 2018-07-02 LAB — HEMOGLOBIN A1C
Est. average glucose Bld gHb Est-mCnc: <74 mg/dL
Hgb A1c MFr Bld: <4.2 % — ABNORMAL LOW (ref 4.8–5.6)

## 2018-07-02 LAB — LIPID PANEL
Chol/HDL Ratio: 4.1 ratio (ref 0.0–5.0)
Cholesterol, Total: 160 mg/dL (ref 100–199)
HDL: 39 mg/dL — ABNORMAL LOW (ref >39)
LDL Calculated: 97 mg/dL (ref 0–99)
Triglycerides: 121 mg/dL (ref 0–149)
VLDL Cholesterol Cal: 24 mg/dL (ref 5–40)

## 2018-07-20 ENCOUNTER — Other Ambulatory Visit: Payer: Self-pay | Admitting: Internal Medicine

## 2018-07-20 ENCOUNTER — Other Ambulatory Visit: Payer: Managed Care, Other (non HMO)

## 2018-07-20 DIAGNOSIS — N289 Disorder of kidney and ureter, unspecified: Secondary | ICD-10-CM

## 2018-07-20 NOTE — Progress Notes (Signed)
Lab orders only 

## 2018-07-20 NOTE — Progress Notes (Unsigned)
Lab orders only 

## 2018-07-21 LAB — BMP8+ANION GAP
Anion Gap: 18 mmol/L (ref 10.0–18.0)
BUN/Creatinine Ratio: 10 (ref 9–20)
BUN: 15 mg/dL (ref 6–24)
CO2: 25 mmol/L (ref 20–29)
Calcium: 9.9 mg/dL (ref 8.7–10.2)
Chloride: 99 mmol/L (ref 96–106)
Creatinine, Ser: 1.43 mg/dL — ABNORMAL HIGH (ref 0.76–1.27)
GFR calc Af Amer: 68 mL/min/{1.73_m2} (ref >59)
GFR calc non Af Amer: 59 mL/min/{1.73_m2} — ABNORMAL LOW (ref >59)
Glucose: 127 mg/dL — ABNORMAL HIGH (ref 65–99)
Potassium: 3.8 mmol/L (ref 3.5–5.2)
Sodium: 142 mmol/L (ref 134–144)

## 2018-07-22 ENCOUNTER — Other Ambulatory Visit: Payer: Self-pay | Admitting: Internal Medicine

## 2018-07-27 ENCOUNTER — Other Ambulatory Visit: Payer: Self-pay

## 2018-07-27 MED ORDER — CARVEDILOL 6.25 MG PO TABS: 6.2500 mg | ORAL_TABLET | Freq: Two times a day (BID) | ORAL | 0 refills | Status: DC

## 2018-08-11 ENCOUNTER — Other Ambulatory Visit: Payer: Self-pay | Admitting: Nurse Practitioner

## 2018-09-17 ENCOUNTER — Telehealth: Payer: Self-pay

## 2018-09-17 NOTE — Telephone Encounter (Signed)
Pt notified that his disability forms has been faxed and a copy was placed up front for him to pickup.

## 2018-10-06 ENCOUNTER — Ambulatory Visit (INDEPENDENT_AMBULATORY_CARE_PROVIDER_SITE_OTHER): Payer: Managed Care, Other (non HMO) | Admitting: Internal Medicine

## 2018-10-06 ENCOUNTER — Other Ambulatory Visit: Payer: Self-pay

## 2018-10-06 ENCOUNTER — Encounter: Payer: Self-pay | Admitting: Internal Medicine

## 2018-10-06 VITALS — BP 128/82 | HR 95 | Temp 98.4°F | Ht 67.0 in | Wt 230.8 lb

## 2018-10-06 DIAGNOSIS — I129 Hypertensive chronic kidney disease with stage 1 through stage 4 chronic kidney disease, or unspecified chronic kidney disease: Secondary | ICD-10-CM

## 2018-10-06 DIAGNOSIS — N181 Chronic kidney disease, stage 1: Secondary | ICD-10-CM

## 2018-10-06 DIAGNOSIS — I10 Essential (primary) hypertension: Secondary | ICD-10-CM

## 2018-10-06 DIAGNOSIS — E1165 Type 2 diabetes mellitus with hyperglycemia: Secondary | ICD-10-CM

## 2018-10-06 DIAGNOSIS — E1122 Type 2 diabetes mellitus with diabetic chronic kidney disease: Secondary | ICD-10-CM

## 2018-10-06 MED ORDER — SEMAGLUTIDE(0.25 OR 0.5MG/DOS) 2 MG/1.5ML ~~LOC~~ SOPN: 0.2500 mg | PEN_INJECTOR | SUBCUTANEOUS | 0 refills | Status: DC

## 2018-10-06 NOTE — Progress Notes (Signed)
Subjective:     Patient ID: Nicolas [name] , male    DOB: 07-24-1976 , 43 y.o.   MRN: [id number]   Chief Complaint  Patient presents with  . Hypertension  . Diabetes    HPI Pt is here for Fu HTN, his BP at home runs 120-125/ 79=83. Blood sugars  On average has been between 90-110 Was at his nephrologist in December and creat was normal and his potasium was a little low and was told to eat foods rich  In potasium.  Has has been using the ones a week injectable medication that she was taught to use here and was given a sample, but does not know the name and is on his med list. His wife sent a picture and it is Ozempic, but he has not been turning it for the dosage. Says he is on 0.5 mg.  Walks for 45 min 5-6 times a week.  Past Medical History:  Diagnosis Date  . Arthritis    arthritis- back & L hip  . Depression    situational depression, pt. out of work   . Diabetes mellitus ([place])   . HTN (hypertension) 05/10/2015  . Osteoarthritis of left hip 06/07/2012  . Sickle cell anemia (HCC)      Family History  Problem Relation Age of Onset  . Cancer - Other Father   . CAD Neg Hx      Current Outpatient Medications:  .  amLODipine (NORVASC) 10 MG tablet, TAKE 1 TABLET BY MOUTH EVERY DAY, Disp: 90 tablet, Rfl: 1 .  carvedilol (COREG) 6.25 MG tablet, Take 1 tablet (6.25 mg total) by mouth 2 (two) times daily with a meal., Disp: 180 tablet, Rfl: 0 .  Cholecalciferol (VITAMIN D3) 2000 units capsule, Take 2,000 Units by mouth daily., Disp: , Rfl: 5 .  folic acid (FOLVITE) 1 MG tablet, Take 1 mg by mouth daily., Disp: , Rfl: 11 .  glucose blood test strip, Use as instructed (Patient taking differently: 1 each by Other route every other day. Use as instructed), Disp: 100 each, Rfl: 12 .  glucose monitoring kit (FREESTYLE) monitoring kit, Glucometer- ACCUCHECK BRAND, Disp: 1 each, Rfl: 0 .  JANUVIA 100 MG tablet, Take 100 mg by mouth daily., Disp: , Rfl: 0 .  Lancets (ACCU-CHEK MULTICLIX)  lancets, Use as instructed (Patient taking differently: 1 each by Other route every other day. ), Disp: 100 each, Rfl: 12 .  levofloxacin (LEVAQUIN) 750 MG tablet, Take 1 tablet (750 mg total) by mouth daily., Disp: 10 tablet, Rfl: 0 .  lisinopril (PRINIVIL,ZESTRIL) 5 MG tablet, Take 5 mg by mouth daily., Disp: , Rfl: 6 .  meloxicam (MOBIC) 15 MG tablet, Take 15 mg by mouth daily as needed for pain. , Disp: , Rfl: 1 .  metFORMIN (GLUCOPHAGE) 500 MG tablet, Take 500 mg by mouth 2 (two) times daily with a meal., Disp: , Rfl:  .  naproxen (NAPROSYN) 500 MG tablet, Take 1 tablet (500 mg total) by mouth 2 (two) times daily with a meal., Disp: 30 tablet, Rfl: 0 .  orphenadrine (NORFLEX) 100 MG tablet, Take 1 tablet (100 mg total) by mouth 2 (two) times daily., Disp: 30 tablet, Rfl: 0 .  oxyCODONE-acetaminophen (PERCOCET) 7.5-325 MG tablet, Take 1 tablet by mouth every 4 (four) hours as needed for severe pain., Disp: , Rfl:  .  penicillin v potassium (VEETID) 500 MG tablet, Take 1 tablet (500 mg total) by mouth 4 (four) times daily., Disp: 40 tablet,  Rfl: 0 .  pravastatin (PRAVACHOL) 20 MG tablet, Take 20 mg by mouth daily., Disp: , Rfl:  .  triamterene-hydrochlorothiazide (MAXZIDE-25) 37.5-25 MG per tablet, Take 1 tablet by mouth daily., Disp: , Rfl: 1   No Known Allergies   Review of Systems  Constitutional: Negative for chills, diaphoresis and fever.  HENT: Positive for rhinorrhea.   Respiratory: Negative for cough, chest tightness and shortness of breath.   Cardiovascular: Negative for chest pain and leg swelling.  Gastrointestinal: Negative for abdominal distention, abdominal pain, constipation, diarrhea, nausea and vomiting.  Skin: Negative for rash and wound.  Neurological: Negative for dizziness, weakness and headaches.     Today's Vitals   10/06/18 1117  BP: 128/82  Pulse: 95  Temp: 98.4 F (36.9 C)  TempSrc: Oral  SpO2: 95%  Weight: 230 lb 12.8 oz (104.7 kg)  Height: '5\' 7"'  (1.702  m)   Body mass index is 36.15 kg/m.   Objective:  Physical Exam   Constitutional: She is oriented to person, place, and time. She appears well-developed and well-nourished. No distress.  HENT:  Head: Normocephalic and atraumatic.  Right Ear: External ear normal.  Left Ear: External ear normal.  Nose: Nose normal.  Eyes: Conjunctivae are normal. Right eye exhibits no discharge. Left eye exhibits no discharge. No scleral icterus.  Neck: Neck supple. No thyromegaly present.  No carotid bruits bilaterally  Cardiovascular: Normal rate and regular rhythm.  No murmur heard. Pulmonary/Chest: Effort normal and breath sounds normal. No respiratory distress.  Musculoskeletal: Normal range of motion. She exhibits no edema.  Lymphadenopathy:    She has no cervical adenopathy.  Neurological: She is alert and oriented to person, place, and time.  Skin: Skin is warm and dry. Capillary refill takes less than 2 seconds. No rash noted. She is not diaphoretic.  Psychiatric: She has a normal mood and affect. Her behavior is normal. Judgment and thought content normal.  Nursing note reviewed.  Assessment And Plan:    1. Uncontrolled type 2 diabetes mellitus with hyperglycemia (HCC)- chronic - Hemoglobin A1c I re-educated him how to use the Ozempic pen, and gave him another sample with a discount coupon.  2. CKD stage 1 due to type 2 diabetes mellitus ([place])- past hx of this. - CMP14 + Anion Gap  3. Essential hypertension- stable. May continue same meds - CBC no Diff   Fu 4 months with Dr Maryann Conners, PA-C

## 2018-10-07 LAB — CMP14 + ANION GAP
ALT: 37 IU/L (ref 0–44)
AST: 39 IU/L (ref 0–40)
Albumin/Globulin Ratio: 1.6 (ref 1.2–2.2)
Albumin: 4.5 g/dL (ref 3.5–5.5)
Alkaline Phosphatase: 62 IU/L (ref 39–117)
Anion Gap: 17 mmol/L (ref 10.0–18.0)
BUN/Creatinine Ratio: 10 (ref 9–20)
BUN: 14 mg/dL (ref 6–24)
Bilirubin Total: 0.7 mg/dL (ref 0.0–1.2)
CO2: 26 mmol/L (ref 20–29)
Calcium: 10 mg/dL (ref 8.7–10.2)
Chloride: 100 mmol/L (ref 96–106)
Creatinine, Ser: 1.36 mg/dL — ABNORMAL HIGH (ref 0.76–1.27)
GFR calc Af Amer: 72 mL/min/{1.73_m2} (ref >59)
GFR calc non Af Amer: 62 mL/min/{1.73_m2} (ref >59)
Globulin, Total: 2.9 g/dL (ref 1.5–4.5)
Glucose: 111 mg/dL — ABNORMAL HIGH (ref 65–99)
Potassium: 3.7 mmol/L (ref 3.5–5.2)
Sodium: 143 mmol/L (ref 134–144)
Total Protein: 7.4 g/dL (ref 6.0–8.5)

## 2018-10-07 LAB — CBC
Hematocrit: 35.3 % — ABNORMAL LOW (ref 37.5–51.0)
Hemoglobin: 11.7 g/dL — ABNORMAL LOW (ref 13.0–17.7)
MCH: 27.9 pg (ref 26.6–33.0)
MCHC: 33.1 g/dL (ref 31.5–35.7)
MCV: 84 fL (ref 79–97)
NRBC: 1 % — ABNORMAL HIGH (ref 0–0)
Platelets: 323 10*3/uL (ref 150–450)
RBC: 4.2 x10E6/uL (ref 4.14–5.80)
RDW: 18.4 % — ABNORMAL HIGH (ref 11.6–15.4)
WBC: 7.6 10*3/uL (ref 3.4–10.8)

## 2018-10-07 LAB — HEMOGLOBIN A1C
Est. average glucose Bld gHb Est-mCnc: 77 mg/dL
Hgb A1c MFr Bld: 4.3 % — ABNORMAL LOW (ref 4.8–5.6)

## 2018-11-03 ENCOUNTER — Other Ambulatory Visit: Payer: Self-pay | Admitting: Internal Medicine

## 2018-11-29 ENCOUNTER — Telehealth: Payer: Self-pay

## 2018-11-29 MED ORDER — SEMAGLUTIDE(0.25 OR 0.5MG/DOS) 2 MG/1.5ML ~~LOC~~ SOPN: 0.5000 mg | PEN_INJECTOR | SUBCUTANEOUS | 2 refills | Status: DC

## 2018-11-29 NOTE — Telephone Encounter (Signed)
Error

## 2018-12-17 ENCOUNTER — Other Ambulatory Visit: Payer: Self-pay | Admitting: Internal Medicine

## 2018-12-19 DIAGNOSIS — E65 Localized adiposity: Secondary | ICD-10-CM | POA: Insufficient documentation

## 2018-12-19 NOTE — Progress Notes (Signed)
Patient is here for follow up visit.  Subjective:   Nicolas [name], male    DOB: January 15, 1976, 43 y.o.   MRN: [id number]   Chief Complaint  Patient presents with  . Follow-up    hypertensive heart disease, 6 month f/u     HPI  43 year old African-American male with hypertension, type 2 diabetes mellitus, hyperlipidemia, history of sickle cell trait.  Patient is doing well and denies chest pain, shortness of breath, palpitations, leg edema, orthopnea, PND, TIA/syncope. He is compliant with medical therapy. Blood pressure is very well controlled.   His A1C was 4.3% in 09/2018. He is not on a statin    Past Medical History:  Diagnosis Date  . Arthritis    arthritis- back & L hip  . Depression    situational depression, pt. out of work   . Diabetes mellitus ([place])   . HTN (hypertension) 05/10/2015  . Osteoarthritis of left hip 06/07/2012  . Sickle cell anemia (HCC)      Past Surgical History:  Procedure Laterality Date  . IR GENERIC HISTORICAL  09/26/2016   IR FLUORO GUIDE CV LINE RIGHT 09/26/2016 Aletta Edouard, MD [facility]  . IR GENERIC HISTORICAL  09/26/2016   IR US GUIDE VASC ACCESS RIGHT 09/26/2016 Aletta Edouard, MD [facility]  . JOINT REPLACEMENT     R hip  . TOTAL HIP ARTHROPLASTY  06/07/2012   Procedure: TOTAL HIP ARTHROPLASTY;  Surgeon: Johnny Bridge, MD;  Location: [facility];  Service: Orthopedics;  Laterality: Left;     Social History   Socioeconomic History  . Marital status: Married    Spouse name: Not on file  . Number of children: 2  . Years of education: Not on file  . Highest education level: Not on file  Occupational History  . Occupation: Disabled  Social Needs  . Financial resource strain: Not on file  . Food insecurity:    Worry: Not on file    Inability: Not on file  . Transportation needs:    Medical: Not on file    Non-medical: Not on file  Tobacco Use  . Smoking status: Never Smoker  . Smokeless tobacco: Never Used   Substance and Sexual Activity  . Alcohol use: Yes    Alcohol/week: 1.0 standard drinks    Types: 1 Standard drinks or equivalent per week    Comment: ocassional   . Drug use: No  . Sexual activity: Yes  Lifestyle  . Physical activity:    Days per week: Not on file    Minutes per session: Not on file  . Stress: Not on file  Relationships  . Social connections:    Talks on phone: Not on file    Gets together: Not on file    Attends religious service: Not on file    Active member of club or organization: Not on file    Attends meetings of clubs or organizations: Not on file    Relationship status: Not on file  . Intimate partner violence:    Fear of current or ex partner: Not on file    Emotionally abused: Not on file    Physically abused: Not on file    Forced sexual activity: Not on file  Other Topics Concern  . Not on file  Social History Narrative  . Not on file     Current Outpatient Medications on File Prior to Visit  Medication Sig Dispense Refill  . amLODipine (NORVASC) 10 MG tablet TAKE  1 TABLET BY MOUTH EVERY DAY 90 tablet 1  . carvedilol (COREG) 6.25 MG tablet Take 1 tablet (6.25 mg total) by mouth 2 (two) times daily with a meal. 180 tablet 0  . Cholecalciferol (VITAMIN D3) 2000 units capsule Take 2,000 Units by mouth daily.  5  . cyclobenzaprine (FLEXERIL) 10 MG tablet Take 10 mg by mouth 3 (three) times daily as needed for muscle spasms.    . folic acid (FOLVITE) 1 MG tablet Take 1 mg by mouth daily.  11  . glucose blood test strip Use as instructed (Patient taking differently: 1 each by Other route every other day. Use as instructed) 100 each 12  . glucose monitoring kit (FREESTYLE) monitoring kit Glucometer- ACCUCHECK BRAND 1 each 0  . JANUVIA 100 MG tablet TAKE 1 TABLET BY MOUTH EVERY DAY 30 tablet 5  . Lancets (ACCU-CHEK MULTICLIX) lancets Use as instructed (Patient taking differently: 1 each by Other route every other day. ) 100 each 12  . lisinopril  (PRINIVIL,ZESTRIL) 20 MG tablet Take 20 mg by mouth daily.    . metFORMIN (GLUCOPHAGE) 500 MG tablet Take 500 mg by mouth 2 (two) times daily with a meal.    . oxyCODONE-acetaminophen (PERCOCET) 7.5-325 MG tablet Take 1 tablet by mouth every 4 (four) hours as needed for severe pain.    . Semaglutide,0.25 or 0.5MG/DOS, (OZEMPIC, 0.25 OR 0.5 MG/DOSE,) 2 MG/1.5ML SOPN Inject 0.5 mg into the skin once a week. 1 pen 2  . triamterene-hydrochlorothiazide (MAXZIDE-25) 37.5-25 MG tablet TAKE 1 TABLET BY MOUTH EVERY DAY 90 tablet 1   No current facility-administered medications on file prior to visit.     Cardiovascular studies:  EKG 12/21/2018: Sinus rhythm 79 bpm. Normal axis Lead loss V1,V2, V4-V6 Limited interpretation  Echocardiogram 12/30/2016: Left ventricle cavity is normal in size. Mild concentric hypertrophy of the left ventricle. Normal global wall motion. Normal diastolic filling pattern, normal LAP. Calculated EF 56%. Left atrial cavity is mildly dilated at 4.1 cm. Structurally normal tricuspid valve with trace regurgitation. No evidence of pulmonary hypertension. No significant change from  Echo- 06/30/13.  Exercise Myoview stress test 11/21/2013: 1.  Resting EKG showed normal sinus rhythm, poor R wave progression. Stress EKG was negative for ischemia. There was non specific T changes noted at peak exercise, which resolved at < 2 minutes into recovery. Patient exercised on BRUCE PROTOCOL for 9 minutes 2 seconds. The maximum work level achieved was 10.1 MET's. Hypertensive BP response. The baseline blood pressure was 146/88 mmHg and 210/98 mmHg with exercise. The test was terminated due to achievement of the target heart rate.  2. Perfusion imaging study demonstrated mild soft tissue attenuation consistent with diaphragmatic attenuation. There was no e/o ischemia or scar. The left ventricular systolic function was normal at 56%. This is a low risk study.  Recent labs: Results for KRAVEN, CALK (MRN [id number]) as of 12/21/2018 10:44  Ref. Range 10/06/2018 12:25  Sodium Latest Ref Range: 134 - 144 mmol/L 143  Potassium Latest Ref Range: 3.5 - 5.2 mmol/L 3.7  Chloride Latest Ref Range: 96 - 106 mmol/L 100  CO2 Latest Ref Range: 20 - 29 mmol/L 26  Glucose Latest Ref Range: 65 - 99 mg/dL 111 (H)  BUN Latest Ref Range: 6 - 24 mg/dL 14  Creatinine Latest Ref Range: 0.76 - 1.27 mg/dL 1.36 (H)  Calcium Latest Ref Range: 8.7 - 10.2 mg/dL 10.0  Anion gap Latest Ref Range: 10.0 - 18.0 mmol/L 17.0  BUN/Creatinine Ratio Latest  Ref Range: 9 - 20  10  Alkaline Phosphatase Latest Ref Range: 39 - 117 IU/L 62  Albumin Latest Ref Range: 3.5 - 5.5 g/dL 4.5  Albumin/Globulin Ratio Latest Ref Range: 1.2 - 2.2  1.6  AST Latest Ref Range: 0 - 40 IU/L 39  ALT Latest Ref Range: 0 - 44 IU/L 37  Total Protein Latest Ref Range: 6.0 - 8.5 g/dL 7.4  Total Bilirubin Latest Ref Range: 0.0 - 1.2 mg/dL 0.7  GFR, Est Non African American Latest Ref Range: >59 mL/min/1.73 62  GFR, Est African American Latest Ref Range: >59 mL/min/1.73 72  Globulin, Total Latest Ref Range: 1.5 - 4.5 g/dL 2.9   Results for MOSHE, WENGER (MRN [id number]) as of 12/21/2018 10:44  Ref. Range 10/06/2018 12:25  WBC Latest Ref Range: 3.4 - 10.8 x10E3/uL 7.6  RBC Latest Ref Range: 4.14 - 5.80 x10E6/uL 4.20  Hemoglobin Latest Ref Range: 13.0 - 17.7 g/dL 11.7 (L)  HCT Latest Ref Range: 37.5 - 51.0 % 35.3 (L)  MCV Latest Ref Range: 79 - 97 fL 84  MCH Latest Ref Range: 26.6 - 33.0 pg 27.9  MCHC Latest Ref Range: 31.5 - 35.7 g/dL 33.1  RDW Latest Ref Range: 11.6 - 15.4 % 18.4 (H)  Platelets Latest Ref Range: 150 - 450 x10E3/uL 323  nRBC Latest Ref Range: 0 - 0 % 1 (H)   Results for AHMAN, DUGDALE (MRN [id number]) as of 12/21/2018 11:51  Ref. Range 07/01/2018 12:13  Total CHOL/HDL Ratio Latest Ref Range: 0.0 - 5.0 ratio 4.1  Cholesterol, Total Latest Ref Range: 100 - 199 mg/dL 160  HDL Cholesterol Latest Ref Range: >39 mg/dL 39 (L)  LDL  (calc) Latest Ref Range: 0 - 99 mg/dL 97  Triglycerides Latest Ref Range: 0 - 149 mg/dL 121  VLDL Cholesterol Cal Latest Ref Range: 5 - 40 mg/dL 24   Results for AZAZEL, FRANZE (MRN [id number]) as of 12/21/2018 11:51  Ref. Range 10/06/2018 12:25  Glucose Latest Ref Range: 65 - 99 mg/dL 111 (H)  Hemoglobin A1C Latest Ref Range: 4.8 - 5.6 % 4.3 (L)  Est. average glucose Bld gHb Est-mCnc Latest Units: mg/dL 77   Review of Systems  Constitution: Negative for decreased appetite, malaise/fatigue, weight gain and weight loss.  HENT: Negative for congestion.   Eyes: Negative for visual disturbance.  Cardiovascular: Negative for chest pain, dyspnea on exertion, leg swelling, palpitations and syncope.  Respiratory: Negative for shortness of breath.   Endocrine: Negative for cold intolerance.  Hematologic/Lymphatic: Does not bruise/bleed easily.  Skin: Negative for itching and rash.  Musculoskeletal: Negative for myalgias.  Gastrointestinal: Negative for abdominal pain, nausea and vomiting.  Genitourinary: Negative for dysuria.  Neurological: Negative for dizziness and weakness.  Psychiatric/Behavioral: The patient is not nervous/anxious.   All other systems reviewed and are negative.      Objective:    Vitals:   12/21/18 1129  BP: 135/90  Pulse: 80  SpO2: 94%     Physical Exam  Constitutional: He is oriented to person, place, and time. He appears well-developed and well-nourished. No distress.  HENT:  Head: Normocephalic and atraumatic.  Eyes: Pupils are equal, round, and reactive to light. Conjunctivae are normal.  Neck: No JVD present.  Cardiovascular: Normal rate, regular rhythm and intact distal pulses.  No murmur heard. Pulmonary/Chest: Effort normal and breath sounds normal. He has no wheezes. He has no rales.  Abdominal: Soft. Bowel sounds are normal. There is no rebound.  Musculoskeletal:  General: No edema.  Lymphadenopathy:    He has no cervical adenopathy.   Neurological: He is alert and oriented to person, place, and time. No cranial nerve deficit.  Skin: Skin is warm and dry.  Psychiatric: He has a normal mood and affect.  Nursing note and vitals reviewed.       Assessment & Recommendations:    43 year old African-American male with hypertension, type 2 diabetes mellitus, hyperlipidemia, history of sickle cell trait  1. Essential hypertension Fairly well controlled. No changes made today.  He is not on a statin. While he is diabetic, A1C is very well controlled on current therapy. Unless Ur Microalbumin is elevated, reasonable to skip statin, if lipid panel is favorable. He would like to discuss with PCP after his physical.  Follow up with Dr. Woody Seller in 1 year.      Nigel Mormon, MD [facility] Pager: [phone] Office: [phone] If no answer Cell [phone]

## 2018-12-21 ENCOUNTER — Other Ambulatory Visit: Payer: Self-pay

## 2018-12-21 ENCOUNTER — Ambulatory Visit (INDEPENDENT_AMBULATORY_CARE_PROVIDER_SITE_OTHER): Payer: Managed Care, Other (non HMO) | Admitting: Cardiology

## 2018-12-21 ENCOUNTER — Encounter: Payer: Self-pay | Admitting: Cardiology

## 2018-12-21 VITALS — BP 135/90 | HR 80 | Ht 67.0 in | Wt 235.0 lb

## 2018-12-21 DIAGNOSIS — I10 Essential (primary) hypertension: Secondary | ICD-10-CM

## 2018-12-24 ENCOUNTER — Other Ambulatory Visit: Payer: Self-pay | Admitting: Internal Medicine

## 2019-01-13 ENCOUNTER — Other Ambulatory Visit: Payer: Self-pay

## 2019-01-13 ENCOUNTER — Telehealth: Payer: Self-pay | Admitting: Internal Medicine

## 2019-01-13 MED ORDER — SEMAGLUTIDE(0.25 OR 0.5MG/DOS) 2 MG/1.5ML ~~LOC~~ SOPN: 0.5000 mg | PEN_INJECTOR | SUBCUTANEOUS | 2 refills | Status: DC

## 2019-01-13 NOTE — Telephone Encounter (Signed)
PT REQ REFILL OF OZEMPIC SENT INTO [facility] [address]

## 2019-01-19 LAB — HM DIABETES EYE EXAM

## 2019-01-30 ENCOUNTER — Other Ambulatory Visit: Payer: Self-pay | Admitting: Internal Medicine

## 2019-01-30 ENCOUNTER — Other Ambulatory Visit: Payer: Self-pay | Admitting: Cardiology

## 2019-02-01 ENCOUNTER — Other Ambulatory Visit: Payer: Self-pay

## 2019-02-01 ENCOUNTER — Ambulatory Visit (INDEPENDENT_AMBULATORY_CARE_PROVIDER_SITE_OTHER): Payer: Managed Care, Other (non HMO)

## 2019-02-01 VITALS — Ht 70.0 in | Wt 220.0 lb

## 2019-02-01 DIAGNOSIS — Z0000 Encounter for general adult medical examination without abnormal findings: Secondary | ICD-10-CM | POA: Diagnosis not present

## 2019-02-01 NOTE — Patient Instructions (Signed)
[name] , Thank you for taking time to come for your Medicare Wellness Visit. I appreciate your ongoing commitment to your health goals. Please review the following plan we discussed and let me know if I can assist you in the future.   Screening recommendations/referrals: Colonoscopy: n/a Recommended yearly ophthalmology/optometry visit for glaucoma screening and checkup Recommended yearly dental visit for hygiene and checkup  Vaccinations: Influenza vaccine: declines Pneumococcal vaccine: 01/2015 Tdap vaccine: 05/2014 Shingles vaccine: n/a    Advanced directives: Advance directive discussed with you today.  Conditions/risks identified: Obesity  Next appointment: 02/07/2019 at 9:00  Preventive Care 40-64 Years, Male Preventive care refers to lifestyle choices and visits with your health care provider that can promote health and wellness. What does preventive care include?  A yearly physical exam. This is also called an annual well check.  Dental exams once or twice a year.  Routine eye exams. Ask your health care provider how often you should have your eyes checked.  Personal lifestyle choices, including:  Daily care of your teeth and gums.  Regular physical activity.  Eating a healthy diet.  Avoiding tobacco and drug use.  Limiting alcohol use.  Practicing safe sex.  Taking low-dose aspirin every day starting at age 54. What happens during an annual well check? The services and screenings done by your health care provider during your annual well check will depend on your age, overall health, lifestyle risk factors, and family history of disease. Counseling  Your health care provider may ask you questions about your:  Alcohol use.  Tobacco use.  Drug use.  Emotional well-being.  Home and relationship well-being.  Sexual activity.  Eating habits.  Work and work Astronomer. Screening  You may have the following tests or measurements:  Height,  weight, and BMI.  Blood pressure.  Lipid and cholesterol levels. These may be checked every 5 years, or more frequently if you are over 50 years old.  Skin check.  Lung cancer screening. You may have this screening every year starting at age 49 if you have a 30-pack-year history of smoking and currently smoke or have quit within the past 15 years.  Fecal occult blood test (FOBT) of the stool. You may have this test every year starting at age 27.  Flexible sigmoidoscopy or colonoscopy. You may have a sigmoidoscopy every 5 years or a colonoscopy every 10 years starting at age 19.  Prostate cancer screening. Recommendations will vary depending on your family history and other risks.  Hepatitis C blood test.  Hepatitis B blood test.  Sexually transmitted disease (STD) testing.  Diabetes screening. This is done by checking your blood sugar (glucose) after you have not eaten for a while (fasting). You may have this done every 1-3 years. Discuss your test results, treatment options, and if necessary, the need for more tests with your health care provider. Vaccines  Your health care provider may recommend certain vaccines, such as:  Influenza vaccine. This is recommended every year.  Tetanus, diphtheria, and acellular pertussis (Tdap, Td) vaccine. You may need a Td booster every 10 years.  Zoster vaccine. You may need this after age 60.  Pneumococcal 13-valent conjugate (PCV13) vaccine. You may need this if you have certain conditions and have not been vaccinated.  Pneumococcal polysaccharide (PPSV23) vaccine. You may need one or two doses if you smoke cigarettes or if you have certain conditions. Talk to your health care provider about which screenings and vaccines you need and how often you need  them. This information is not intended to replace advice given to you by your health care provider. Make sure you discuss any questions you have with your health care provider. Document  Released: 10/12/2015 Document Revised: 06/04/2016 Document Reviewed: 07/17/2015 Elsevier Interactive Patient Education  2017 [place] Prevention in the Home Falls can cause injuries. They can happen to people of all ages. There are many things you can do to make your home safe and to help prevent falls. What can I do on the outside of my home?  Regularly fix the edges of walkways and driveways and fix any cracks.  Remove anything that might make you trip as you walk through a door, such as a raised step or threshold.  Trim any bushes or trees on the path to your home.  Use bright outdoor lighting.  Clear any walking paths of anything that might make someone trip, such as rocks or tools.  Regularly check to see if handrails are loose or broken. Make sure that both sides of any steps have handrails.  Any raised decks and porches should have guardrails on the edges.  Have any leaves, snow, or ice cleared regularly.  Use sand or salt on walking paths during winter.  Clean up any spills in your garage right away. This includes oil or grease spills. What can I do in the bathroom?  Use night lights.  Install grab bars by the toilet and in the tub and shower. Do not use towel bars as grab bars.  Use non-skid mats or decals in the tub or shower.  If you need to sit down in the shower, use a plastic, non-slip stool.  Keep the floor dry. Clean up any water that spills on the floor as soon as it happens.  Remove soap buildup in the tub or shower regularly.  Attach bath mats securely with double-sided non-slip rug tape.  Do not have throw rugs and other things on the floor that can make you trip. What can I do in the bedroom?  Use night lights.  Make sure that you have a light by your bed that is easy to reach.  Do not use any sheets or blankets that are too big for your bed. They should not hang down onto the floor.  Have a firm chair that has side arms. You can use  this for support while you get dressed.  Do not have throw rugs and other things on the floor that can make you trip. What can I do in the kitchen?  Clean up any spills right away.  Avoid walking on wet floors.  Keep items that you use a lot in easy-to-reach places.  If you need to reach something above you, use a strong step stool that has a grab bar.  Keep electrical cords out of the way.  Do not use floor polish or wax that makes floors slippery. If you must use wax, use non-skid floor wax.  Do not have throw rugs and other things on the floor that can make you trip. What can I do with my stairs?  Do not leave any items on the stairs.  Make sure that there are handrails on both sides of the stairs and use them. Fix handrails that are broken or loose. Make sure that handrails are as long as the stairways.  Check any carpeting to make sure that it is firmly attached to the stairs. Fix any carpet that is loose or worn.  Avoid  having throw rugs at the top or bottom of the stairs. If you do have throw rugs, attach them to the floor with carpet tape.  Make sure that you have a light switch at the top of the stairs and the bottom of the stairs. If you do not have them, ask someone to add them for you. What else can I do to help prevent falls?  Wear shoes that:  Do not have high heels.  Have rubber bottoms.  Are comfortable and fit you well.  Are closed at the toe. Do not wear sandals.  If you use a stepladder:  Make sure that it is fully opened. Do not climb a closed stepladder.  Make sure that both sides of the stepladder are locked into place.  Ask someone to hold it for you, if possible.  Clearly mark and make sure that you can see:  Any grab bars or handrails.  First and last steps.  Where the edge of each step is.  Use tools that help you move around (mobility aids) if they are needed. These include:  Canes.  Walkers.  Scooters.  Crutches.  Turn on  the lights when you go into a dark area. Replace any light bulbs as soon as they burn out.  Set up your furniture so you have a clear path. Avoid moving your furniture around.  If any of your floors are uneven, fix them.  If there are any pets around you, be aware of where they are.  Review your medicines with your doctor. Some medicines can make you feel dizzy. This can increase your chance of falling. Ask your doctor what other things that you can do to help prevent falls. This information is not intended to replace advice given to you by your health care provider. Make sure you discuss any questions you have with your health care provider. Document Released: 07/12/2009 Document Revised: 02/21/2016 Document Reviewed: 10/20/2014 Elsevier Interactive Patient Education  2017 [employer].

## 2019-02-01 NOTE — Progress Notes (Signed)
Subjective:   Merit Maybee is a 43 y.o. male who presents for Medicare Annual/Subsequent preventive examination.  This visit type was conducted due to national recommendations for restrictions regarding the COVID-19 Pandemic (e.g. social distancing). This format is felt to be most appropriate for this patient at this time. All issues noted in this document were discussed and addressed. No physical exam was performed (except for noted visual exam findings with Video Visits). The patient, Mr. Syd Newsome, has given consent to perform this visit via video. Vital signs may be absent or patient reported.   Patient location: at home   Nurse location:  [place] office  Review of Systems:  n/a Cardiac Risk Factors include: male gender;diabetes mellitus;hypertension     Objective:    Vitals: Ht '5\' 10"'  (1.778 m) Comment: per patient report  Wt 220 lb (99.8 kg) Comment: per patient report  BMI 31.57 kg/m   Body mass index is 31.57 kg/m.  Advanced Directives 02/01/2019 06/25/2017 09/23/2016 09/22/2016 02/12/2016 05/10/2015 01/11/2015  Does Patient Have a Medical Advance Directive? No No No No No No No  Would patient like information on creating a medical advance directive? - - No - Patient declined - No - patient declined information No - patient declined information -  Pre-existing out of facility DNR order (yellow form or pink MOST form) - - - - - - -    Tobacco Social History   Tobacco Use  Smoking Status Never Smoker  Smokeless Tobacco Never Used     Counseling given: Not Answered   Clinical Intake:  Pre-visit preparation completed: Yes  Pain : No/denies pain     Nutritional Status: BMI > 30  Obese Nutritional Risks: None Diabetes: Yes CBG done?: No Did pt. bring in CBG monitor from home?: No  How often do you need to have someone help you when you read instructions, pamphlets, or other written materials from your doctor or pharmacy?: 1 - Never What is the last grade level  you completed in school?: some college        Past Medical History:  Diagnosis Date  . Arthritis    arthritis- back & L hip  . Depression    situational depression, pt. out of work   . Diabetes mellitus ([place])   . HTN (hypertension) 05/10/2015  . Osteoarthritis of left hip 06/07/2012  . Sickle cell anemia ([facility])    Past Surgical History:  Procedure Laterality Date  . IR GENERIC HISTORICAL  09/26/2016   IR FLUORO GUIDE CV LINE RIGHT 09/26/2016 Aletta Edouard, MD [facility]  . IR GENERIC HISTORICAL  09/26/2016   IR US GUIDE VASC ACCESS RIGHT 09/26/2016 Aletta Edouard, MD [facility]  . JOINT REPLACEMENT     R hip  . TOTAL HIP ARTHROPLASTY  06/07/2012   Procedure: TOTAL HIP ARTHROPLASTY;  Surgeon: Johnny Bridge, MD;  Location: [facility];  Service: Orthopedics;  Laterality: Left;   Family History  Problem Relation Age of Onset  . Cancer - Other Father   . CAD Neg Hx    Social History   Socioeconomic History  . Marital status: Married    Spouse name: Not on file  . Number of children: 2  . Years of education: Not on file  . Highest education level: Not on file  Occupational History  . Occupation: Disabled  Social Needs  . Financial resource strain: Not hard at all  . Food insecurity:    Worry: Never true    Inability: Never  true  . Transportation needs:    Medical: No    Non-medical: No  Tobacco Use  . Smoking status: Never Smoker  . Smokeless tobacco: Never Used  Substance and Sexual Activity  . Alcohol use: Not Currently    Comment: ocassional   . Drug use: Yes    Types: Oxycodone  . Sexual activity: Yes  Lifestyle  . Physical activity:    Days per week: 3 days    Minutes per session: 60 min  . Stress: Not at all  Relationships  . Social connections:    Talks on phone: Not on file    Gets together: Not on file    Attends religious service: Not on file    Active member of club or organization: Not on file    Attends meetings of clubs or organizations:  Not on file    Relationship status: Not on file  Other Topics Concern  . Not on file  Social History Narrative  . Not on file    Outpatient Encounter Medications as of 02/01/2019  Medication Sig  . amLODipine (NORVASC) 10 MG tablet TAKE 1 TABLET BY MOUTH EVERY DAY  . carvedilol (COREG) 6.25 MG tablet Take 1 tablet (6.25 mg total) by mouth 2 (two) times daily with a meal.  . Cholecalciferol (VITAMIN D3) 2000 units capsule Take 2,000 Units by mouth daily.  . cyclobenzaprine (FLEXERIL) 10 MG tablet Take 10 mg by mouth 3 (three) times daily as needed for muscle spasms.  . folic acid (FOLVITE) 1 MG tablet Take 1 mg by mouth daily.  . folic acid (FOLVITE) 1 MG tablet Take by mouth.  Marland Kitchen glucose blood test strip Use as instructed (Patient taking differently: 1 each by Other route every other day. Use as instructed)  . glucose monitoring kit (FREESTYLE) monitoring kit Glucometer- ACCUCHECK BRAND  . JANUVIA 100 MG tablet TAKE 1 TABLET BY MOUTH EVERY DAY  . Lancets (ACCU-CHEK MULTICLIX) lancets Use as instructed (Patient taking differently: 1 each by Other route every other day. )  . lisinopril (ZESTRIL) 20 MG tablet 1 (ONE) TABLET DAILY  . metFORMIN (GLUCOPHAGE) 500 MG tablet Take 500 mg by mouth 2 (two) times daily with a meal.  . oxyCODONE-acetaminophen (PERCOCET) 7.5-325 MG tablet Take 1 tablet by mouth every 4 (four) hours as needed for severe pain.  . Semaglutide,0.25 or 0.5MG/DOS, (OZEMPIC, 0.25 OR 0.5 MG/DOSE,) 2 MG/1.5ML SOPN Inject 0.5 mg into the skin once a week.  . triamterene-hydrochlorothiazide (MAXZIDE-25) 37.5-25 MG tablet TAKE 1 TABLET BY MOUTH EVERY DAY   No facility-administered encounter medications on file as of 02/01/2019.     Activities of Daily Living In your present state of health, do you have any difficulty performing the following activities: 02/01/2019  Hearing? N  Vision? N  Difficulty concentrating or making decisions? N  Walking or climbing stairs? Y  Comment when in  pain has some trouble  Dressing or bathing? N  Comment when having a sickle cell crisis  Doing errands, shopping? N  Preparing Food and eating ? N  Using the Toilet? N  In the past six months, have you accidently leaked urine? N  Do you have problems with loss of bowel control? N  Managing your Medications? N  Managing your Finances? N  Housekeeping or managing your Housekeeping? N  Some recent data might be hidden    Patient Care Team: [name], MD as PCP - General (Internal Medicine)   Assessment:   This is a routine  wellness examination for [facility].  Exercise Activities and Dietary recommendations Current Exercise Habits: Home exercise routine, Type of exercise: walking, Time (Minutes): 60, Frequency (Times/Week): 3, Weekly Exercise (Minutes/Week): 180  Goals    . Patient Stated     To stay healthy       Fall Risk Fall Risk  02/01/2019 10/06/2018 07/01/2018  Falls in the past year? 0 0 No  Risk for fall due to : Medication side effect - -  Follow up Falls prevention discussed - -   Is the patient's home free of loose throw rugs in walkways, pet beds, electrical cords, etc?   yes      Grab bars in the bathroom? yes      Handrails on the stairs?   no      Adequate lighting?   yes  Timed Get Up and Go Performed: n/a  Depression Screen PHQ 2/9 Scores 02/01/2019 10/06/2018 07/01/2018  PHQ - 2 Score 0 0 0  PHQ- 9 Score 0 - -    Cognitive Function     6CIT Screen 02/01/2019  What Year? 0 points  What month? 0 points  What time? 0 points  Count back from 20 0 points  Months in reverse 0 points  Repeat phrase 0 points  Total Score 0     There is no immunization history on file for this patient.  Qualifies for Shingles Vaccine? no  Screening Tests Health Maintenance  Topic Date Due  . FOOT EXAM  10/02/1983  . OPHTHALMOLOGY EXAM  10/02/1983  . TETANUS/TDAP  10/01/1992  . HEMOGLOBIN A1C  04/06/2019  . INFLUENZA VACCINE  04/30/2019  . PNEUMOCOCCAL  POLYSACCHARIDE VACCINE AGE 25-64 HIGH RISK  Completed  . HIV Screening  Completed   Cancer Screenings: Lung: Low Dose CT Chest recommended if Age 57-80 years, 30 pack-year currently smoking OR have quit w/in 15years. Patient does not qualify. Colorectal: n/a  Additional Screenings:  Hepatitis C Screening:n/a      Plan:   Wants to stay healthy.  I have personally reviewed and noted the following in the patient's chart:   . Medical and social history . Use of alcohol, tobacco or illicit drugs  . Current medications and supplements . Functional ability and status . Nutritional status . Physical activity . Advanced directives . List of other physicians . Hospitalizations, surgeries, and ER visits in previous 12 months . Vitals . Screenings to include cognitive, depression, and falls . Referrals and appointments  In addition, I have reviewed and discussed with patient certain preventive protocols, quality metrics, and best practice recommendations. A written personalized care plan for preventive services as well as general preventive health recommendations were provided to patient.     Kellie Simmering, LPN  01/04/2499

## 2019-02-04 ENCOUNTER — Other Ambulatory Visit: Payer: Self-pay | Admitting: Internal Medicine

## 2019-02-07 ENCOUNTER — Encounter: Payer: Self-pay | Admitting: Internal Medicine

## 2019-02-07 ENCOUNTER — Other Ambulatory Visit: Payer: Self-pay

## 2019-02-07 ENCOUNTER — Other Ambulatory Visit: Payer: Self-pay | Admitting: Internal Medicine

## 2019-02-07 ENCOUNTER — Ambulatory Visit (INDEPENDENT_AMBULATORY_CARE_PROVIDER_SITE_OTHER): Payer: Medicare Other | Admitting: Internal Medicine

## 2019-02-07 VITALS — BP 126/82 | HR 84 | Temp 98.3°F | Ht 66.4 in | Wt 230.8 lb

## 2019-02-07 DIAGNOSIS — K429 Umbilical hernia without obstruction or gangrene: Secondary | ICD-10-CM

## 2019-02-07 DIAGNOSIS — I129 Hypertensive chronic kidney disease with stage 1 through stage 4 chronic kidney disease, or unspecified chronic kidney disease: Secondary | ICD-10-CM

## 2019-02-07 DIAGNOSIS — E1122 Type 2 diabetes mellitus with diabetic chronic kidney disease: Secondary | ICD-10-CM

## 2019-02-07 DIAGNOSIS — Z0000 Encounter for general adult medical examination without abnormal findings: Secondary | ICD-10-CM | POA: Diagnosis not present

## 2019-02-07 DIAGNOSIS — N182 Chronic kidney disease, stage 2 (mild): Secondary | ICD-10-CM | POA: Diagnosis not present

## 2019-02-07 DIAGNOSIS — R351 Nocturia: Secondary | ICD-10-CM

## 2019-02-07 DIAGNOSIS — Z6836 Body mass index (BMI) 36.0-36.9, adult: Secondary | ICD-10-CM

## 2019-02-07 LAB — POCT URINALYSIS DIPSTICK
Bilirubin, UA: NEGATIVE
Blood, UA: NEGATIVE
Glucose, UA: NEGATIVE
Ketones, UA: NEGATIVE
Leukocytes, UA: NEGATIVE
Nitrite, UA: NEGATIVE
Protein, UA: POSITIVE — AB
Spec Grav, UA: 1.02 (ref 1.010–1.025)
Urobilinogen, UA: 0.2 E.U./dL
pH, UA: 6 (ref 5.0–8.0)

## 2019-02-07 LAB — POCT UA - MICROALBUMIN
Albumin/Creatinine Ratio, Urine, POC: 300
Creatinine, POC: 300 mg/dL
Microalbumin Ur, POC: 80 mg/L

## 2019-02-07 NOTE — Patient Instructions (Signed)

## 2019-02-07 NOTE — Progress Notes (Addendum)
Subjective:     Patient ID: Nicolas [name] , male    DOB: 10/02/1975 , 43 y.o.   MRN: [id number]   Chief Complaint  Patient presents with  . Diabetes  . Hypertension  . Annual Exam    HPI  He presents today for diabetes follow-up. He would like to have a physical examination performed today as well.   Diabetes  He presents for his follow-up diabetic visit. He has type 2 diabetes mellitus. There are no hypoglycemic associated symptoms. Pertinent negatives for diabetes include no blurred vision and no chest pain. There are no hypoglycemic complications. Diabetic complications include nephropathy. Current diabetic treatment includes oral agent (dual therapy). He participates in exercise intermittently. His home blood glucose trend is fluctuating minimally. His breakfast blood glucose is taken between 8-9 am. His breakfast blood glucose range is generally 110-130 mg/dl. An ACE inhibitor/angiotensin II receptor blocker is being taken.  Hypertension  This is a chronic problem. The current episode started more than 1 year ago. The problem has been gradually improving since onset. The problem is controlled. Pertinent negatives include no blurred vision, chest pain, palpitations or shortness of breath.     Past Medical History:  Diagnosis Date  . Arthritis    arthritis- back & L hip  . Depression    situational depression, pt. out of work   . Diabetes mellitus ([place])   . HTN (hypertension) 05/10/2015  . Osteoarthritis of left hip 06/07/2012  . Sickle cell anemia (H[facility])      Family History  Problem Relation Age of Onset  . Cancer - Other Father   . Liver cancer Father   . Diabetes Mother   . CAD Neg Hx      Current Outpatient Medications:  .  amLODipine (NORVASC) 10 MG tablet, TAKE 1 TABLET BY MOUTH EVERY DAY, Disp: 90 tablet, Rfl: 1 .  carvedilol (COREG) 6.25 MG tablet, Take 1 tablet (6.25 mg total) by mouth 2 (two) times daily with a meal., Disp: 180 tablet, Rfl: 0 .  cyclobenzaprine  (FLEXERIL) 10 MG tablet, Take 10 mg by mouth 3 (three) times daily as needed for muscle spasms., Disp: , Rfl:  .  folic acid (FOLVITE) 1 MG tablet, Take 1 mg by mouth daily., Disp: , Rfl: 11 .  glucose blood test strip, Use as instructed (Patient taking differently: 1 each by Other route every other day. Use as instructed), Disp: 100 each, Rfl: 12 .  glucose monitoring kit (FREESTYLE) monitoring kit, Glucometer- ACCUCHECK BRAND, Disp: 1 each, Rfl: 0 .  JANUVIA 100 MG tablet, TAKE 1 TABLET BY MOUTH EVERY DAY, Disp: 30 tablet, Rfl: 5 .  Lancets (ACCU-CHEK MULTICLIX) lancets, Use as instructed (Patient taking differently: 1 each by Other route every other day. ), Disp: 100 each, Rfl: 12 .  lisinopril (ZESTRIL) 20 MG tablet, 1 (ONE) TABLET DAILY, Disp: 90 tablet, Rfl: 2 .  metFORMIN (GLUCOPHAGE) 500 MG tablet, Take 500 mg by mouth 2 (two) times daily with a meal., Disp: , Rfl:  .  oxyCODONE-acetaminophen (PERCOCET) 7.5-325 MG tablet, Take 1 tablet by mouth every 4 (four) hours as needed for severe pain., Disp: , Rfl:  .  Semaglutide,0.25 or 0.5MG/DOS, (OZEMPIC, 0.25 OR 0.5 MG/DOSE,) 2 MG/1.5ML SOPN, Inject 0.5 mg into the skin once a week., Disp: 1 pen, Rfl: 2 .  Cholecalciferol (VITAMIN D3) 2000 units capsule, Take 2,000 Units by mouth daily., Disp: , Rfl: 5 .  triamterene-hydrochlorothiazide (MAXZIDE-25) 37.5-25 MG tablet, TAKE 1 TABLET BY MOUTH  EVERY DAY, Disp: 90 tablet, Rfl: 1   No Known Allergies   Review of Systems  Constitutional: Negative.   HENT: Negative.   Eyes: Negative.  Negative for blurred vision.  Respiratory: Negative.  Negative for shortness of breath.   Cardiovascular: Negative.  Negative for chest pain and palpitations.  Gastrointestinal: Negative.   Endocrine: Negative.   Genitourinary: Negative.   Musculoskeletal: Negative.   Skin: Negative.   Allergic/Immunologic: Negative.   Neurological: Negative.   Hematological: Negative.   Psychiatric/Behavioral: Negative.       Today's Vitals   02/07/19 0918  BP: 126/82  Pulse: 84  Temp: 98.3 F (36.8 C)  TempSrc: Oral  Weight: 230 lb 12.8 oz (104.7 kg)  Height: 5' 6.4" (1.687 m)  PainSc: 7   PainLoc: Back   Body mass index is 36.8 kg/m.   Objective:  Physical Exam Vitals signs and nursing note reviewed.  Constitutional:      Appearance: Normal appearance. He is obese.  HENT:     Head: Normocephalic and atraumatic.     Right Ear: Tympanic membrane, ear canal and external ear normal.     Left Ear: Tympanic membrane, ear canal and external ear normal.     Nose: Nose normal.     Mouth/Throat:     Mouth: Mucous membranes are moist.     Pharynx: Oropharynx is clear.  Eyes:     Extraocular Movements: Extraocular movements intact.     Conjunctiva/sclera: Conjunctivae normal.     Pupils: Pupils are equal, round, and reactive to light.  Neck:     Musculoskeletal: Normal range of motion and neck supple.  Cardiovascular:     Rate and Rhythm: Normal rate and regular rhythm.     Pulses: Normal pulses.          Dorsalis pedis pulses are 2+ on the right side and 2+ on the left side.     Heart sounds: Normal heart sounds.  Pulmonary:     Effort: Pulmonary effort is normal.     Breath sounds: Normal breath sounds.  Chest:     Breasts:        Right: Normal. No swelling, bleeding, inverted nipple, mass or nipple discharge.        Left: Normal. No swelling, bleeding, inverted nipple, mass or nipple discharge.  Abdominal:     General: Abdomen is flat and protuberant. Bowel sounds are normal.     Palpations: Abdomen is soft.     Hernia: A hernia is present. Hernia is present in the umbilical area.     Comments: Umbilical hernia is reducible  Genitourinary:    Comments: Deferred as per patient Musculoskeletal: Normal range of motion.  Feet:     Right foot:     Protective Sensation: 5 sites tested. 5 sites sensed.     Skin integrity: Skin integrity normal.     Toenail Condition: Right toenails are  normal.     Left foot:     Protective Sensation: 5 sites tested. 5 sites sensed.     Skin integrity: Skin integrity normal.     Toenail Condition: Left toenails are normal.  Skin:    General: Skin is warm.  Neurological:     General: No focal deficit present.     Mental Status: He is alert.  Psychiatric:        Mood and Affect: Mood normal.        Behavior: Behavior normal.  Assessment And Plan:    1. CKD stage 2 due to type 2 diabetes mellitus ([place])  Diabetic foot exam was performed. He is scheduled for his next diabetic eye exam June 2020.  He is followed by Dr. Katy Fitch.  I DISCUSSED WITH THE PATIENT AT LENGTH REGARDING THE GOALS OF GLYCEMIC CONTROL AND POSSIBLE LONG-TERM COMPLICATIONS.  I  ALSO STRESSED THE IMPORTANCE OF COMPLIANCE WITH HOME GLUCOSE MONITORING, DIETARY RESTRICTIONS INCLUDING AVOIDANCE OF SUGARY DRINKS/PROCESSED FOODS,  ALONG WITH REGULAR EXERCISE.  I  ALSO STRESSED THE IMPORTANCE OF ANNUAL EYE EXAMS, SELF FOOT CARE AND COMPLIANCE WITH OFFICE VISITS.  - CMP14+EGFR - CBC - Lipid panel - Hemoglobin A1c  2. Hypertensive nephropathy  Well controlled. He will continue with current meds. He is encouraged to avoid adding salt to his foods. EKG performed. He will rto in 4 months for re-evaluation.   3. Nocturia  DRE deferred as per patient. I will check a PSA and urinalysis.   - PSA  4. Class 2 severe obesity due to excess calories with serious comorbidity and body mass index (BMI) of 36.0 to 36.9 in adult [facility])  Importance of achieving optimal weight to decrease risk of cardiovascular disease and cancers was discussed with the patient in full detail. He is encouraged to start slowly - start with 10 minutes twice daily at least three to four days per week and to gradually build to 30 minutes five days weekly. He was given tips to incorporate more activity into her daily routine - take stairs when possible, park farther away from her job, grocery stores, etc.    5. Routine general medical examination at health care facility  A full exam was performed. DRE deferred. PATIENT HAS BEEN ADVISED TO GET 30-45 MINUTES REGULAR EXERCISE NO LESS THAN FOUR TO FIVE DAYS PER WEEK - BOTH WEIGHTBEARING EXERCISES AND AEROBIC ARE RECOMMENDED.  HE IS ADVISED TO FOLLOW A HEALTHY DIET WITH AT LEAST SIX FRUITS/VEGGIES PER DAY, DECREASE INTAKE OF RED MEAT, AND TO INCREASE FISH INTAKE TO TWO DAYS PER WEEK.  MEATS/FISH SHOULD NOT BE FRIED, BAKED OR BROILED IS PREFERABLE.  I SUGGEST WEARING SPF 50 SUNSCREEN ON EXPOSED PARTS AND ESPECIALLY WHEN IN THE DIRECT SUNLIGHT FOR AN EXTENDED PERIOD OF TIME.  PLEASE AVOID FAST FOOD RESTAURANTS AND INCREASE YOUR WATER INTAKE.  6. Umbilical hernia without obstruction or gangrene.  This is easily reducible. He is asymptomatic. I will continue to monitor this.   [name], MD    THE PATIENT IS ENCOURAGED TO PRACTICE SOCIAL DISTANCING DUE TO THE COVID-19 PANDEMIC.

## 2019-02-08 LAB — CMP14+EGFR
ALT: 49 IU/L — ABNORMAL HIGH (ref 0–44)
AST: 51 IU/L — ABNORMAL HIGH (ref 0–40)
Albumin/Globulin Ratio: 2 (ref 1.2–2.2)
Albumin: 5 g/dL (ref 4.0–5.0)
Alkaline Phosphatase: 65 IU/L (ref 39–117)
BUN/Creatinine Ratio: 10 (ref 9–20)
BUN: 12 mg/dL (ref 6–24)
Bilirubin Total: 0.7 mg/dL (ref 0.0–1.2)
CO2: 24 mmol/L (ref 20–29)
Calcium: 10.1 mg/dL (ref 8.7–10.2)
Chloride: 97 mmol/L (ref 96–106)
Creatinine, Ser: 1.21 mg/dL (ref 0.76–1.27)
GFR calc Af Amer: 83 mL/min/{1.73_m2} (ref >59)
GFR calc non Af Amer: 72 mL/min/{1.73_m2} (ref >59)
Globulin, Total: 2.5 g/dL (ref 1.5–4.5)
Glucose: 107 mg/dL — ABNORMAL HIGH (ref 65–99)
Potassium: 3.9 mmol/L (ref 3.5–5.2)
Sodium: 141 mmol/L (ref 134–144)
Total Protein: 7.5 g/dL (ref 6.0–8.5)

## 2019-02-08 LAB — CBC
Hematocrit: 34.6 % — ABNORMAL LOW (ref 37.5–51.0)
Hemoglobin: 11.3 g/dL — ABNORMAL LOW (ref 13.0–17.7)
MCH: 27.5 pg (ref 26.6–33.0)
MCHC: 32.7 g/dL (ref 31.5–35.7)
MCV: 84 fL (ref 79–97)
NRBC: 1 % — ABNORMAL HIGH (ref 0–0)
Platelets: 402 10*3/uL (ref 150–450)
RBC: 4.11 x10E6/uL — ABNORMAL LOW (ref 4.14–5.80)
RDW: 18.5 % — ABNORMAL HIGH (ref 11.6–15.4)
WBC: 8.5 10*3/uL (ref 3.4–10.8)

## 2019-02-08 LAB — LIPID PANEL
Chol/HDL Ratio: 3.6 ratio (ref 0.0–5.0)
Cholesterol, Total: 155 mg/dL (ref 100–199)
HDL: 43 mg/dL (ref >39)
LDL Calculated: 94 mg/dL (ref 0–99)
Triglycerides: 90 mg/dL (ref 0–149)
VLDL Cholesterol Cal: 18 mg/dL (ref 5–40)

## 2019-02-08 LAB — HEMOGLOBIN A1C
Est. average glucose Bld gHb Est-mCnc: <74 mg/dL
Hgb A1c MFr Bld: <4.2 % — ABNORMAL LOW (ref 4.8–5.6)

## 2019-02-08 LAB — PSA: Prostate Specific Ag, Serum: 1.2 ng/mL (ref 0.0–4.0)

## 2019-02-08 LAB — FRUCTOSAMINE: Fructosamine: 249 umol/L (ref 0–285)

## 2019-02-15 ENCOUNTER — Other Ambulatory Visit: Payer: Self-pay | Admitting: Internal Medicine

## 2019-02-25 ENCOUNTER — Telehealth: Payer: Self-pay

## 2019-02-25 NOTE — Telephone Encounter (Signed)
No answer message left. Questions about cholesterol medication

## 2019-03-02 ENCOUNTER — Other Ambulatory Visit: Payer: Self-pay

## 2019-03-02 ENCOUNTER — Telehealth: Payer: Self-pay

## 2019-03-02 MED ORDER — PRAVASTATIN SODIUM 20 MG PO TABS: 20.0000 mg | ORAL_TABLET | Freq: Every evening | ORAL | 0 refills | Status: DC

## 2019-03-02 NOTE — Telephone Encounter (Signed)
Spoke with pt about taking chol meds. He stated that he hasnt been taking them before but he would start. Medication sent to pharmacy.

## 2019-03-16 ENCOUNTER — Other Ambulatory Visit: Payer: Self-pay | Admitting: Nurse Practitioner

## 2019-05-11 ENCOUNTER — Other Ambulatory Visit: Payer: Self-pay | Admitting: Internal Medicine

## 2019-05-25 ENCOUNTER — Encounter: Payer: Self-pay | Admitting: Internal Medicine

## 2019-05-25 ENCOUNTER — Ambulatory Visit (INDEPENDENT_AMBULATORY_CARE_PROVIDER_SITE_OTHER): Payer: Managed Care, Other (non HMO) | Admitting: Internal Medicine

## 2019-05-25 ENCOUNTER — Other Ambulatory Visit: Payer: Self-pay

## 2019-05-25 VITALS — BP 118/78 | HR 93 | Temp 98.5°F | Ht 66.4 in | Wt 225.0 lb

## 2019-05-25 DIAGNOSIS — D571 Sickle-cell disease without crisis: Secondary | ICD-10-CM

## 2019-05-25 DIAGNOSIS — N182 Chronic kidney disease, stage 2 (mild): Secondary | ICD-10-CM

## 2019-05-25 DIAGNOSIS — E1122 Type 2 diabetes mellitus with diabetic chronic kidney disease: Secondary | ICD-10-CM | POA: Diagnosis not present

## 2019-05-25 DIAGNOSIS — Z6835 Body mass index (BMI) 35.0-35.9, adult: Secondary | ICD-10-CM

## 2019-05-25 DIAGNOSIS — I129 Hypertensive chronic kidney disease with stage 1 through stage 4 chronic kidney disease, or unspecified chronic kidney disease: Secondary | ICD-10-CM

## 2019-05-25 MED ORDER — OZEMPIC (0.25 OR 0.5 MG/DOSE) 2 MG/1.5ML ~~LOC~~ SOPN: 0.5000 mg | PEN_INJECTOR | SUBCUTANEOUS | 2 refills | Status: DC

## 2019-05-25 NOTE — Patient Instructions (Addendum)
PLEASE STOP THE JANUVIA   Diabetes Mellitus and Foot Care Foot care is an important part of your health, especially when you have diabetes. Diabetes may cause you to have problems because of poor blood flow (circulation) to your feet and legs, which can cause your skin to:  Become thinner and drier.  Break more easily.  Heal more slowly.  Peel and crack. You may also have nerve damage (neuropathy) in your legs and feet, causing decreased feeling in them. This means that you may not notice minor injuries to your feet that could lead to more serious problems. Noticing and addressing any potential problems early is the best way to prevent future foot problems. How to care for your feet Foot hygiene  Wash your feet daily with warm water and mild soap. Do not use hot water. Then, pat your feet and the areas between your toes until they are completely dry. Do not soak your feet as this can dry your skin.  Trim your toenails straight across. Do not dig under them or around the cuticle. File the edges of your nails with an emery board or nail file.  Apply a moisturizing lotion or petroleum jelly to the skin on your feet and to dry, brittle toenails. Use lotion that does not contain alcohol and is unscented. Do not apply lotion between your toes. Shoes and socks  Wear clean socks or stockings every day. Make sure they are not too tight. Do not wear knee-high stockings since they may decrease blood flow to your legs.  Wear shoes that fit properly and have enough cushioning. Always look in your shoes before you put them on to be sure there are no objects inside.  To break in new shoes, wear them for just a few hours a day. This prevents injuries on your feet. Wounds, scrapes, corns, and calluses  Check your feet daily for blisters, cuts, bruises, sores, and redness. If you cannot see the bottom of your feet, use a mirror or ask someone for help.  Do not cut corns or calluses or try to remove  them with medicine.  If you find a minor scrape, cut, or break in the skin on your feet, keep it and the skin around it clean and dry. You may clean these areas with mild soap and water. Do not clean the area with peroxide, alcohol, or iodine.  If you have a wound, scrape, corn, or callus on your foot, look at it several times a day to make sure it is healing and not infected. Check for: ? Redness, swelling, or pain. ? Fluid or blood. ? Warmth. ? Pus or a bad smell. General instructions  Do not cross your legs. This may decrease blood flow to your feet.  Do not use heating pads or hot water bottles on your feet. They may burn your skin. If you have lost feeling in your feet or legs, you may not know this is happening until it is too late.  Protect your feet from hot and cold by wearing shoes, such as at the beach or on hot pavement.  Schedule a complete foot exam at least once a year (annually) or more often if you have foot problems. If you have foot problems, report any cuts, sores, or bruises to your health care provider immediately. Contact a health care provider if:  You have a medical condition that increases your risk of infection and you have any cuts, sores, or bruises on your feet.  You  have an injury that is not healing.  You have redness on your legs or feet.  You feel burning or tingling in your legs or feet.  You have pain or cramps in your legs and feet.  Your legs or feet are numb.  Your feet always feel cold.  You have pain around a toenail. Get help right away if:  You have a wound, scrape, corn, or callus on your foot and: ? You have pain, swelling, or redness that gets worse. ? You have fluid or blood coming from the wound, scrape, corn, or callus. ? Your wound, scrape, corn, or callus feels warm to the touch. ? You have pus or a bad smell coming from the wound, scrape, corn, or callus. ? You have a fever. ? You have a red line going up your leg.  Summary  Check your feet every day for cuts, sores, red spots, swelling, and blisters.  Moisturize feet and legs daily.  Wear shoes that fit properly and have enough cushioning.  If you have foot problems, report any cuts, sores, or bruises to your health care provider immediately.  Schedule a complete foot exam at least once a year (annually) or more often if you have foot problems. This information is not intended to replace advice given to you by your health care provider. Make sure you discuss any questions you have with your health care provider. Document Released: 09/12/2000 Document Revised: 10/28/2017 Document Reviewed: 10/17/2016 Elsevier Patient Education  2020 [employer].

## 2019-05-26 LAB — BMP8+EGFR
BUN/Creatinine Ratio: 12 (ref 9–20)
BUN: 15 mg/dL (ref 6–24)
CO2: 25 mmol/L (ref 20–29)
Calcium: 9.5 mg/dL (ref 8.7–10.2)
Chloride: 101 mmol/L (ref 96–106)
Creatinine, Ser: 1.3 mg/dL — ABNORMAL HIGH (ref 0.76–1.27)
GFR calc Af Amer: 76 mL/min/{1.73_m2} (ref >59)
GFR calc non Af Amer: 66 mL/min/{1.73_m2} (ref >59)
Glucose: 123 mg/dL — ABNORMAL HIGH (ref 65–99)
Potassium: 3.4 mmol/L — ABNORMAL LOW (ref 3.5–5.2)
Sodium: 143 mmol/L (ref 134–144)

## 2019-05-26 LAB — FRUCTOSAMINE: Fructosamine: 240 umol/L (ref 0–285)

## 2019-05-27 NOTE — Progress Notes (Signed)
Subjective:     Patient ID: Nicolas [name] , male    DOB: 1975-12-11 , 43 y.o.   MRN: [id number]   Chief Complaint  Patient presents with  . Diabetes  . Hypertension    HPI  He presents today for diabetes follow-up. He would like to have a physical examination performed today as well.   Diabetes He presents for his follow-up diabetic visit. He has type 2 diabetes mellitus. There are no hypoglycemic associated symptoms. Pertinent negatives for diabetes include no blurred vision and no chest pain. There are no hypoglycemic complications. Diabetic complications include nephropathy. Risk factors for coronary artery disease include diabetes mellitus, dyslipidemia, hypertension, male sex, sedentary lifestyle and obesity. Current diabetic treatment includes oral agent (dual therapy). He is following a diabetic diet. He participates in exercise intermittently. His home blood glucose trend is fluctuating minimally. His breakfast blood glucose is taken between 8-9 am. His breakfast blood glucose range is generally 110-130 mg/dl. An ACE inhibitor/angiotensin II receptor blocker is being taken.  Hypertension This is a chronic problem. The current episode started more than 1 year ago. The problem has been gradually improving since onset. The problem is controlled. Pertinent negatives include no blurred vision, chest pain, palpitations or shortness of breath. Past treatments include ACE inhibitors, calcium channel blockers and beta blockers. The current treatment provides moderate improvement. Compliance problems include exercise.  Hypertensive end-organ damage includes kidney disease.     Past Medical History:  Diagnosis Date  . Arthritis    arthritis- back & L hip  . Depression    situational depression, pt. out of work   . Diabetes mellitus ([place])   . HTN (hypertension) 05/10/2015  . Osteoarthritis of left hip 06/07/2012  . Sickle cell anemia (H[facility])      Family History  Problem Relation Age of Onset   . Cancer - Other Father   . Liver cancer Father   . Diabetes Mother   . CAD Neg Hx      Current Outpatient Medications:  .  amLODipine (NORVASC) 10 MG tablet, TAKE 1 TABLET BY MOUTH EVERY DAY, Disp: 90 tablet, Rfl: 1 .  carvedilol (COREG) 6.25 MG tablet, TAKE 1 TABLET BY MOUTH TWICE A DAY WITH A MEAL, Disp: 180 tablet, Rfl: 0 .  Cholecalciferol (VITAMIN D3) 2000 units capsule, Take 2,000 Units by mouth daily., Disp: , Rfl: 5 .  cyclobenzaprine (FLEXERIL) 10 MG tablet, Take 10 mg by mouth 3 (three) times daily as needed for muscle spasms., Disp: , Rfl:  .  folic acid (FOLVITE) 1 MG tablet, Take 1 mg by mouth daily., Disp: , Rfl: 11 .  glucose blood test strip, Use as instructed (Patient taking differently: 1 each by Other route every other day. Use as instructed), Disp: 100 each, Rfl: 12 .  glucose monitoring kit (FREESTYLE) monitoring kit, Glucometer- ACCUCHECK BRAND, Disp: 1 each, Rfl: 0 .  Lancets (ACCU-CHEK MULTICLIX) lancets, Use as instructed (Patient taking differently: 1 each by Other route every other day. ), Disp: 100 each, Rfl: 12 .  lisinopril (ZESTRIL) 20 MG tablet, 1 (ONE) TABLET DAILY, Disp: 90 tablet, Rfl: 2 .  metFORMIN (GLUCOPHAGE) 500 MG tablet, Take 500 mg by mouth 2 (two) times daily with a meal., Disp: , Rfl:  .  oxyCODONE-acetaminophen (PERCOCET) 7.5-325 MG tablet, Take 1 tablet by mouth every 4 (four) hours as needed for severe pain., Disp: , Rfl:  .  pravastatin (PRAVACHOL) 20 MG tablet, TAKE 1 TABLET BY MOUTH EVERY DAY IN THE  EVENING, Disp: 90 tablet, Rfl: 0 .  Semaglutide,0.25 or 0.5MG/DOS, (OZEMPIC, 0.25 OR 0.5 MG/DOSE,) 2 MG/1.5ML SOPN, Inject 0.5 mg into the skin once a week., Disp: 3 pen, Rfl: 2 .  triamterene-hydrochlorothiazide (MAXZIDE-25) 37.5-25 MG tablet, TAKE 1 TABLET BY MOUTH EVERY DAY, Disp: 90 tablet, Rfl: 1   No Known Allergies   Review of Systems  Constitutional: Negative.   Eyes: Negative for blurred vision.  Respiratory: Negative.  Negative  for shortness of breath.   Cardiovascular: Negative.  Negative for chest pain and palpitations.  Gastrointestinal: Negative.   Neurological: Negative.   Psychiatric/Behavioral: Negative.      Today's Vitals   05/25/19 1608  BP: 118/78  Pulse: 93  Temp: 98.5 F (36.9 C)  TempSrc: Oral  SpO2: 97%  Weight: 225 lb (102.1 kg)  Height: 5' 6.4" (1.687 m)   Body mass index is 35.88 kg/m.   Objective:  Physical Exam Vitals signs and nursing note reviewed.  Constitutional:      Appearance: Normal appearance. He is obese.  Cardiovascular:     Rate and Rhythm: Normal rate and regular rhythm.     Heart sounds: Normal heart sounds.  Pulmonary:     Effort: Pulmonary effort is normal.     Breath sounds: Normal breath sounds.  Skin:    General: Skin is warm.  Neurological:     General: No focal deficit present.     Mental Status: He is alert.  Psychiatric:        Mood and Affect: Mood normal.         Assessment And Plan:     1. Type 2 diabetes mellitus with stage 2 chronic kidney disease, without long-term current use of insulin ([place])  I will check labs as listed below. Hba1c NOT drawn due to his h/o sickle cell disease.  Importance of dietary compliance was discussed with the patient.   - Fructosamine - BMP8+EGFR  2. Hypertensive nephropathy  Chronic, well controlled. He will continue with current meds. He is encouraged to limit his salt intake.   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    4. Class 2 severe obesity due to excess calories with serious comorbidity and body mass index (BMI) of 35.0 to 35.9 in adult [facility])  He is encouraged to strive for BMI less than 30 to decrease cardiac risk. Importance of achieving optimal weight to decrease risk of cardiovascular disease and cancers was discussed with the patient in full detail. She is encouraged to start slowly - start with 10 minutes twice daily at least three to four days per week and to  gradually build to 30 minutes five days weekly. She was given tips to incorporate more activity into her daily routine - take stairs when possible, park farther away from her job, grocery stores, etc.     [name], MD    THE PATIENT IS ENCOURAGED TO PRACTICE SOCIAL DISTANCING DUE TO THE COVID-19 PANDEMIC.

## 2019-05-30 ENCOUNTER — Telehealth: Payer: Self-pay

## 2019-05-30 NOTE — Telephone Encounter (Signed)
  Notified patient with lab results  ---- Message from [name], MD sent at 05/29/2019 11:55 PM EDT ----- Your bs are stable. Your kidney function has decreased. Please increase waterintake. Your potassium level is sl. Low. Please increase intake of potassium rich foods. Please send him a list of these foods.

## 2019-05-30 NOTE — Telephone Encounter (Signed)
-----   Message from [name], MD sent at 05/29/2019 11:55 PM EDT ----- Your bs are stable. Your kidney function has decreased. Please increase waterintake. Your potassium level is sl. Low. Please increase intake of potassium rich foods. Please send him a list of these foods.

## 2019-05-30 NOTE — Telephone Encounter (Signed)
Left the patient a message to call back for lab results. 

## 2019-06-01 ENCOUNTER — Other Ambulatory Visit: Payer: Self-pay | Admitting: *Deleted

## 2019-06-01 DIAGNOSIS — Z20822 Contact with and (suspected) exposure to covid-19: Secondary | ICD-10-CM

## 2019-06-02 LAB — NOVEL CORONAVIRUS, NAA: SARS-CoV-2, NAA: NOT DETECTED

## 2019-06-13 ENCOUNTER — Ambulatory Visit: Payer: Medicare Other | Admitting: Internal Medicine

## 2019-06-18 ENCOUNTER — Other Ambulatory Visit: Payer: Self-pay | Admitting: Internal Medicine

## 2019-07-24 ENCOUNTER — Other Ambulatory Visit: Payer: Self-pay | Admitting: Internal Medicine

## 2019-08-17 ENCOUNTER — Other Ambulatory Visit: Payer: Self-pay | Admitting: Internal Medicine

## 2019-08-19 ENCOUNTER — Other Ambulatory Visit: Payer: Self-pay | Admitting: Internal Medicine

## 2019-08-27 ENCOUNTER — Other Ambulatory Visit: Payer: Self-pay | Admitting: Internal Medicine

## 2019-09-12 ENCOUNTER — Ambulatory Visit (INDEPENDENT_AMBULATORY_CARE_PROVIDER_SITE_OTHER): Payer: Managed Care, Other (non HMO) | Admitting: Internal Medicine

## 2019-09-12 ENCOUNTER — Other Ambulatory Visit: Payer: Self-pay

## 2019-09-12 ENCOUNTER — Encounter: Payer: Self-pay | Admitting: Internal Medicine

## 2019-09-12 VITALS — BP 134/86 | HR 98 | Temp 98.0°F | Ht 66.4 in | Wt 225.0 lb

## 2019-09-12 DIAGNOSIS — N182 Chronic kidney disease, stage 2 (mild): Secondary | ICD-10-CM

## 2019-09-12 DIAGNOSIS — D571 Sickle-cell disease without crisis: Secondary | ICD-10-CM | POA: Diagnosis not present

## 2019-09-12 DIAGNOSIS — I129 Hypertensive chronic kidney disease with stage 1 through stage 4 chronic kidney disease, or unspecified chronic kidney disease: Secondary | ICD-10-CM | POA: Diagnosis not present

## 2019-09-12 DIAGNOSIS — E1122 Type 2 diabetes mellitus with diabetic chronic kidney disease: Secondary | ICD-10-CM

## 2019-09-12 DIAGNOSIS — Z6835 Body mass index (BMI) 35.0-35.9, adult: Secondary | ICD-10-CM

## 2019-09-12 NOTE — Patient Instructions (Signed)
Ginger root; turmeric root - Peel both and cut in half; boil in water, drink one cup every night.   Stay well hydrated.

## 2019-09-12 NOTE — Progress Notes (Signed)
This visit occurred during the SARS-CoV-2 public health emergency.  Safety protocols were in place, including screening questions prior to the visit, additional usage of staff PPE, and extensive cleaning of exam room while observing appropriate contact time as indicated for disinfecting solutions.  Subjective:     Patient ID: Nicolas [name] , male    DOB: 11-25-1975 , 43 y.o.   MRN: [id number]   Chief Complaint  Patient presents with  . Diabetes  . Hypertension    HPI  He presents today for diabetes follow-up. He would like to have a physical examination performed today as well.   Diabetes He presents for his follow-up diabetic visit. He has type 2 diabetes mellitus. There are no hypoglycemic associated symptoms. Pertinent negatives for diabetes include no blurred vision and no chest pain. There are no hypoglycemic complications. Diabetic complications include nephropathy. Risk factors for coronary artery disease include diabetes mellitus, dyslipidemia, hypertension, male sex, sedentary lifestyle and obesity. Current diabetic treatment includes oral agent (dual therapy). He is following a diabetic diet. He participates in exercise intermittently. His home blood glucose trend is fluctuating minimally. His breakfast blood glucose is taken between 8-9 am. His breakfast blood glucose range is generally 110-130 mg/dl. An ACE inhibitor/angiotensin II receptor blocker is being taken.  Hypertension This is a chronic problem. The current episode started more than 1 year ago. The problem has been gradually improving since onset. The problem is controlled. Pertinent negatives include no blurred vision, chest pain, palpitations or shortness of breath. Past treatments include ACE inhibitors, calcium channel blockers and beta blockers. The current treatment provides moderate improvement. Compliance problems include exercise.  Hypertensive end-organ damage includes kidney disease.     Past Medical History:   Diagnosis Date  . Arthritis    arthritis- back & L hip  . Depression    situational depression, pt. out of work   . Diabetes mellitus ([place])   . HTN (hypertension) 05/10/2015  . Osteoarthritis of left hip 06/07/2012  . Sickle cell anemia (HCC)      Family History  Problem Relation Age of Onset  . Cancer - Other Father   . Liver cancer Father   . Diabetes Mother   . CAD Neg Hx      Current Outpatient Medications:  .  amLODipine (NORVASC) 10 MG tablet, TAKE 1 TABLET BY MOUTH EVERY DAY, Disp: 90 tablet, Rfl: 1 .  carvedilol (COREG) 6.25 MG tablet, TAKE 1 TABLET BY MOUTH TWICE A DAY WITH MEALS, Disp: 180 tablet, Rfl: 0 .  Cholecalciferol (VITAMIN D3) 2000 units capsule, Take 2,000 Units by mouth daily., Disp: , Rfl: 5 .  cyclobenzaprine (FLEXERIL) 10 MG tablet, Take 10 mg by mouth 3 (three) times daily as needed for muscle spasms., Disp: , Rfl:  .  folic acid (FOLVITE) 1 MG tablet, Take 1 mg by mouth daily., Disp: , Rfl: 11 .  glucose blood test strip, Use as instructed (Patient taking differently: 1 each by Other route every other day. Use as instructed), Disp: 100 each, Rfl: 12 .  glucose monitoring kit (FREESTYLE) monitoring kit, Glucometer- ACCUCHECK BRAND, Disp: 1 each, Rfl: 0 .  Lancets (ACCU-CHEK MULTICLIX) lancets, Use as instructed (Patient taking differently: 1 each by Other route every other day. ), Disp: 100 each, Rfl: 12 .  lisinopril (ZESTRIL) 20 MG tablet, 1 (ONE) TABLET DAILY, Disp: 90 tablet, Rfl: 2 .  metFORMIN (GLUCOPHAGE) 500 MG tablet, TAKE 1 TABLET BY MOUTH TWICE A DAY, Disp: 180 tablet, Rfl: 2 .  oxyCODONE-acetaminophen (PERCOCET) 7.5-325 MG tablet, Take 1 tablet by mouth every 4 (four) hours as needed for severe pain., Disp: , Rfl:  .  pravastatin (PRAVACHOL) 20 MG tablet, TAKE 1 TABLET BY MOUTH EVERY DAY IN THE EVENING, Disp: 90 tablet, Rfl: 0 .  Semaglutide,0.25 or 0.5MG/DOS, (OZEMPIC, 0.25 OR 0.5 MG/DOSE,) 2 MG/1.5ML SOPN, Inject 0.5 mg into the skin once a  week., Disp: 3 pen, Rfl: 2 .  triamterene-hydrochlorothiazide (MAXZIDE-25) 37.5-25 MG tablet, TAKE 1 TABLET BY MOUTH EVERY DAY, Disp: 90 tablet, Rfl: 1   No Known Allergies   Review of Systems  Constitutional: Negative.   Eyes: Negative for blurred vision.  Respiratory: Negative.  Negative for shortness of breath.   Cardiovascular: Negative.  Negative for chest pain and palpitations.  Gastrointestinal: Negative.   Neurological: Negative.   Psychiatric/Behavioral: Negative.      Today's Vitals   09/12/19 1542  BP: 134/86  Pulse: 98  Temp: 98 F (36.7 C)  TempSrc: Oral  Weight: 225 lb (102.1 kg)  Height: 5' 6.4" (1.687 m)  PainSc: 8   PainLoc: Foot   Body mass index is 35.88 kg/m.   Objective:  Physical Exam Vitals and nursing note reviewed.  Constitutional:      Appearance: Normal appearance. He is obese.  Cardiovascular:     Rate and Rhythm: Normal rate and regular rhythm.     Heart sounds: Normal heart sounds.  Pulmonary:     Effort: Pulmonary effort is normal.     Breath sounds: Normal breath sounds.  Skin:    General: Skin is warm.  Neurological:     General: No focal deficit present.     Mental Status: He is alert.  Psychiatric:        Mood and Affect: Mood normal.         Assessment And Plan:     1. Type 2 diabetes mellitus with stage 2 chronic kidney disease, without long-term current use of insulin (HCC)  Chronic. I will check GFR, Cr today. Importance of regular exercise was discussed with the patient. He is encouraged to strive for 150 minutes of regular exercise per week.   - Fructosamine - CMP14+EGFR  2. Hypertensive nephropathy  Chronic, fair control. He will continue with current meds. He is encouraged to avoid adding salt to his foods.   3. 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 I will check retic count. He is also followed by [facility].   - Reticulocytes Count  4. Class 2 severe obesity due to excess calories with  serious comorbidity and body mass index (BMI) of 35.0 to 35.9 in adult [facility])  Again, he is encouraged to incorporate more exercise into his daily routine. Also encouraged to strive for BMI less than 30 to decrease cardiac risk.   [name], MD    THE PATIENT IS ENCOURAGED TO PRACTICE SOCIAL DISTANCING DUE TO THE COVID-19 PANDEMIC.

## 2019-09-13 LAB — CMP14+EGFR
ALT: 25 IU/L (ref 0–44)
AST: 31 IU/L (ref 0–40)
Albumin/Globulin Ratio: 1.7 (ref 1.2–2.2)
Albumin: 4.7 g/dL (ref 4.0–5.0)
Alkaline Phosphatase: 71 IU/L (ref 39–117)
BUN/Creatinine Ratio: 10 (ref 9–20)
BUN: 12 mg/dL (ref 6–24)
Bilirubin Total: 0.6 mg/dL (ref 0.0–1.2)
CO2: 28 mmol/L (ref 20–29)
Calcium: 9.9 mg/dL (ref 8.7–10.2)
Chloride: 97 mmol/L (ref 96–106)
Creatinine, Ser: 1.23 mg/dL (ref 0.76–1.27)
GFR calc Af Amer: 81 mL/min/{1.73_m2} (ref >59)
GFR calc non Af Amer: 70 mL/min/{1.73_m2} (ref >59)
Globulin, Total: 2.8 g/dL (ref 1.5–4.5)
Glucose: 107 mg/dL — ABNORMAL HIGH (ref 65–99)
Potassium: 3.8 mmol/L (ref 3.5–5.2)
Sodium: 141 mmol/L (ref 134–144)
Total Protein: 7.5 g/dL (ref 6.0–8.5)

## 2019-09-13 LAB — FRUCTOSAMINE: Fructosamine: 242 umol/L (ref 0–285)

## 2019-09-13 LAB — RETICULOCYTES: Retic Ct Pct: 4.7 % — ABNORMAL HIGH (ref 0.6–2.6)

## 2019-10-17 DIAGNOSIS — L730 Acne keloid: Secondary | ICD-10-CM | POA: Diagnosis not present

## 2019-11-03 DIAGNOSIS — M8700 Idiopathic aseptic necrosis of unspecified bone: Secondary | ICD-10-CM | POA: Diagnosis not present

## 2019-11-03 DIAGNOSIS — E559 Vitamin D deficiency, unspecified: Secondary | ICD-10-CM | POA: Diagnosis not present

## 2019-11-03 DIAGNOSIS — N060 Isolated proteinuria with minor glomerular abnormality: Secondary | ICD-10-CM | POA: Diagnosis not present

## 2019-11-03 DIAGNOSIS — G8929 Other chronic pain: Secondary | ICD-10-CM | POA: Diagnosis not present

## 2019-11-10 ENCOUNTER — Telehealth: Payer: Self-pay

## 2019-11-10 NOTE — Telephone Encounter (Signed)
I left the pt a message that Dr. Allyne Gee tried to call him last night but she didn't get an answer.  His disability forms has been faxed and his copy has been placed up front for pickup.

## 2019-11-21 ENCOUNTER — Other Ambulatory Visit: Payer: Self-pay | Admitting: Internal Medicine

## 2019-11-21 ENCOUNTER — Other Ambulatory Visit: Payer: Self-pay | Admitting: Cardiology

## 2019-12-06 ENCOUNTER — Other Ambulatory Visit: Payer: Self-pay | Admitting: Internal Medicine

## 2019-12-14 DIAGNOSIS — L730 Acne keloid: Secondary | ICD-10-CM | POA: Diagnosis not present

## 2019-12-18 ENCOUNTER — Other Ambulatory Visit: Payer: Self-pay | Admitting: Internal Medicine

## 2019-12-26 ENCOUNTER — Ambulatory Visit: Payer: Medicare Other | Admitting: Cardiology

## 2020-01-05 LAB — HM DIABETES EYE EXAM

## 2020-01-07 ENCOUNTER — Ambulatory Visit: Payer: Medicare Other | Attending: Internal Medicine

## 2020-01-07 DIAGNOSIS — Z23 Encounter for immunization: Secondary | ICD-10-CM

## 2020-01-07 NOTE — Progress Notes (Signed)
   Covid-19 Vaccination Clinic  Name:  Noel Rodier    MRN: [id number] DOB: 27-Jul-1976  01/07/2020  Mr. Holsworth was observed post Covid-19 immunization for 15 minutes without incident. He was provided with Vaccine Information Sheet and instruction to access the V-Safe system.   Mr. Bonsall was instructed to call 911 with any severe reactions post vaccine: Marland Kitchen Difficulty breathing  . Swelling of face and throat  . A fast heartbeat  . A bad rash all over body  . Dizziness and weakness   Immunizations Administered    Name Date Dose VIS Date Route   Pfizer COVID-19 Vaccine 01/07/2020  3:44 PM 0.3 mL 09/09/2019 Intramuscular   Manufacturer: [employer], [employer]   Lot: [id number]   NDC: [address]-4

## 2020-01-09 ENCOUNTER — Other Ambulatory Visit: Payer: Self-pay | Admitting: Internal Medicine

## 2020-01-19 DIAGNOSIS — H5203 Hypermetropia, bilateral: Secondary | ICD-10-CM | POA: Diagnosis not present

## 2020-01-19 DIAGNOSIS — H538 Other visual disturbances: Secondary | ICD-10-CM | POA: Diagnosis not present

## 2020-01-19 DIAGNOSIS — E119 Type 2 diabetes mellitus without complications: Secondary | ICD-10-CM | POA: Diagnosis not present

## 2020-01-19 LAB — HM DIABETES EYE EXAM

## 2020-01-30 ENCOUNTER — Ambulatory Visit: Payer: Medicare Other | Attending: Internal Medicine

## 2020-01-30 DIAGNOSIS — Z23 Encounter for immunization: Secondary | ICD-10-CM

## 2020-01-30 NOTE — Progress Notes (Signed)
   Covid-19 Vaccination Clinic  Name:  Nicolas [name]    MRN: [id number] DOB: [birth year]  01/30/2020  [name] was observed post Covid-19 immunization for 15 minutes without incident. He was provided with Vaccine Information Sheet and instruction to access the V-Safe system.   [name] was instructed to call 911 with any severe reactions post vaccine: Marland Kitchen Difficulty breathing  . Swelling of face and throat  . A fast heartbeat  . A bad rash all over body  . Dizziness and weakness   Immunizations Administered    Name Date Dose VIS Date Route   Pfizer COVID-19 Vaccine 01/30/2020  2:26 PM 0.3 mL 11/23/2018 Intramuscular   Manufacturer: [employer], [employer]   Lot: [id number]   NDC: [address]-9

## 2020-01-31 ENCOUNTER — Other Ambulatory Visit: Payer: Self-pay | Admitting: Internal Medicine

## 2020-02-02 ENCOUNTER — Ambulatory Visit: Payer: Medicare Other

## 2020-02-08 ENCOUNTER — Encounter: Payer: Self-pay | Admitting: Internal Medicine

## 2020-02-08 ENCOUNTER — Ambulatory Visit (INDEPENDENT_AMBULATORY_CARE_PROVIDER_SITE_OTHER): Payer: Medicare Other | Admitting: Internal Medicine

## 2020-02-08 ENCOUNTER — Other Ambulatory Visit: Payer: Self-pay

## 2020-02-08 ENCOUNTER — Ambulatory Visit (INDEPENDENT_AMBULATORY_CARE_PROVIDER_SITE_OTHER): Payer: Medicare Other

## 2020-02-08 VITALS — BP 118/70 | HR 93 | Temp 98.6°F | Ht 68.2 in | Wt 224.0 lb

## 2020-02-08 DIAGNOSIS — Z0000 Encounter for general adult medical examination without abnormal findings: Secondary | ICD-10-CM | POA: Diagnosis not present

## 2020-02-08 DIAGNOSIS — D571 Sickle-cell disease without crisis: Secondary | ICD-10-CM

## 2020-02-08 DIAGNOSIS — I129 Hypertensive chronic kidney disease with stage 1 through stage 4 chronic kidney disease, or unspecified chronic kidney disease: Secondary | ICD-10-CM | POA: Diagnosis not present

## 2020-02-08 DIAGNOSIS — N182 Chronic kidney disease, stage 2 (mild): Secondary | ICD-10-CM

## 2020-02-08 DIAGNOSIS — Z6833 Body mass index (BMI) 33.0-33.9, adult: Secondary | ICD-10-CM

## 2020-02-08 DIAGNOSIS — E6609 Other obesity due to excess calories: Secondary | ICD-10-CM

## 2020-02-08 DIAGNOSIS — R351 Nocturia: Secondary | ICD-10-CM | POA: Diagnosis not present

## 2020-02-08 DIAGNOSIS — E1122 Type 2 diabetes mellitus with diabetic chronic kidney disease: Secondary | ICD-10-CM | POA: Diagnosis not present

## 2020-02-08 DIAGNOSIS — M8700 Idiopathic aseptic necrosis of unspecified bone: Secondary | ICD-10-CM

## 2020-02-08 LAB — POCT URINALYSIS DIPSTICK
Blood, UA: NEGATIVE
Glucose, UA: NEGATIVE
Leukocytes, UA: NEGATIVE
Nitrite, UA: NEGATIVE
Protein, UA: POSITIVE — AB
Spec Grav, UA: 1.015 (ref 1.010–1.025)
Urobilinogen, UA: 0.2 E.U./dL
pH, UA: 5.5 (ref 5.0–8.0)

## 2020-02-08 LAB — POCT UA - MICROALBUMIN
Creatinine, POC: 300 mg/dL
Microalbumin Ur, POC: 80 mg/L

## 2020-02-08 NOTE — Progress Notes (Signed)
This visit occurred during the SARS-CoV-2 public health emergency.  Safety protocols were in place, including screening questions prior to the visit, additional usage of staff PPE, and extensive cleaning of exam room while observing appropriate contact time as indicated for disinfecting solutions.  Subjective:   Nicolas [name] is a 44 y.o. male who presents for Medicare Annual/Subsequent preventive examination.  Review of Systems:  n/a Cardiac Risk Factors include: male gender;hypertension;diabetes mellitus;obesity (BMI >30kg/m2)     Objective:    Vitals: BP 118/70 (BP Location: Left Arm, Patient Position: Sitting)   Pulse 93   Temp 98.6 F (37 C) (Oral)   Ht 5' 8.2" (1.732 m)   Wt 224 lb (101.6 kg)   BMI 33.86 kg/m   Body mass index is 33.86 kg/m.  Advanced Directives 02/08/2020 02/01/2019 06/25/2017 09/23/2016 09/22/2016 02/12/2016 05/10/2015  Does Patient Have a Medical Advance Directive? No No No No No No No  Would patient like information on creating a medical advance directive? No - Patient declined - - No - Patient declined - No - patient declined information No - patient declined information  Pre-existing out of facility DNR order (yellow form or pink MOST form) - - - - - - -    Tobacco Social History   Tobacco Use  Smoking Status Never Smoker  Smokeless Tobacco Never Used     Counseling given: Not Answered   Clinical Intake:  Pre-visit preparation completed: Yes  Pain : 0-10 Pain Score: 6  Pain Type: Chronic pain Pain Location: Back Pain Orientation: Mid, Lower Pain Descriptors / Indicators: Tingling Pain Onset: More than a month ago Pain Frequency: Constant Pain Relieving Factors: oxycodone eases it  Pain Relieving Factors: oxycodone eases it  Nutritional Status: BMI > 30  Obese Nutritional Risks: None Diabetes: Yes  How often do you need to have someone help you when you read instructions, pamphlets, or other written materials from your doctor or  pharmacy?: 1 - Never What is the last grade level you completed in school?: 2 yrs college  Interpreter Needed?: No  Information entered by :: NAllen LPN  Past Medical History:  Diagnosis Date  . Arthritis    arthritis- back & L hip  . Depression    situational depression, pt. out of work   . Diabetes mellitus ([place])   . HTN (hypertension) 05/10/2015  . Osteoarthritis of left hip 06/07/2012  . Sickle cell anemia (HCC)    Past Surgical History:  Procedure Laterality Date  . IR GENERIC HISTORICAL  09/26/2016   IR FLUORO GUIDE CV LINE RIGHT 09/26/2016 Aletta Edouard, MD [facility]  . IR GENERIC HISTORICAL  09/26/2016   IR US GUIDE VASC ACCESS RIGHT 09/26/2016 Aletta Edouard, MD [facility]  . JOINT REPLACEMENT     R hip  . TOTAL HIP ARTHROPLASTY  06/07/2012   Procedure: TOTAL HIP ARTHROPLASTY;  Surgeon: Johnny Bridge, MD;  Location: [facility];  Service: Orthopedics;  Laterality: Left;   Family History  Problem Relation Age of Onset  . Cancer - Other Father   . Liver cancer Father   . Diabetes Mother   . CAD Neg Hx    Social History   Socioeconomic History  . Marital status: Married    Spouse name: Not on file  . Number of children: 2  . Years of education: Not on file  . Highest education level: Not on file  Occupational History  . Occupation: Disabled  Tobacco Use  . Smoking status: Never Smoker  .  Smokeless tobacco: Never Used  Substance and Sexual Activity  . Alcohol use: Not Currently    Comment: ocassional   . Drug use: Yes    Types: Oxycodone  . Sexual activity: Yes  Other Topics Concern  . Not on file  Social History Narrative  . Not on file   Social Determinants of Health   Financial Resource Strain: Low Risk   . Difficulty of Paying Living Expenses: Not hard at all  Food Insecurity: No Food Insecurity  . Worried About Charity fundraiser in the Last Year: Never true  . Ran Out of Food in the Last Year: Never true  Transportation Needs: No  Transportation Needs  . Lack of Transportation (Medical): No  . Lack of Transportation (Non-Medical): No  Physical Activity: Insufficiently Active  . Days of Exercise per Week: 4 days  . Minutes of Exercise per Session: 30 min  Stress: No Stress Concern Present  . Feeling of Stress : Not at all  Social Connections:   . Frequency of Communication with Friends and Family:   . Frequency of Social Gatherings with Friends and Family:   . Attends Religious Services:   . Active Member of Clubs or Organizations:   . Attends Archivist Meetings:   Marland Kitchen Marital Status:     Outpatient Encounter Medications as of 02/08/2020  Medication Sig  . amLODipine (NORVASC) 10 MG tablet TAKE 1 TABLET BY MOUTH EVERY DAY  . carvedilol (COREG) 6.25 MG tablet TAKE 1 TABLET BY MOUTH TWICE A DAY WITH MEALS  . Cholecalciferol (VITAMIN D3) 2000 units capsule Take 2,000 Units by mouth daily.  . cyclobenzaprine (FLEXERIL) 10 MG tablet Take 10 mg by mouth 3 (three) times daily as needed for muscle spasms.  . folic acid (FOLVITE) 1 MG tablet Take 1 mg by mouth daily.  Marland Kitchen glucose blood test strip Use as instructed (Patient taking differently: 1 each by Other route every other day. Use as instructed)  . glucose monitoring kit (FREESTYLE) monitoring kit Glucometer- ACCUCHECK BRAND  . Lancets (ACCU-CHEK MULTICLIX) lancets Use as instructed (Patient taking differently: 1 each by Other route every other day. )  . lisinopril (ZESTRIL) 10 MG tablet TAKE 2 TABLETS BY MOUTH EVERY DAY  . metFORMIN (GLUCOPHAGE) 500 MG tablet TAKE 1 TABLET BY MOUTH TWICE A DAY  . oxyCODONE-acetaminophen (PERCOCET) 7.5-325 MG tablet Take 1 tablet by mouth every 4 (four) hours as needed for severe pain.  . pravastatin (PRAVACHOL) 20 MG tablet TAKE 1 TABLET BY MOUTH EVERY DAY IN THE EVENING  . Semaglutide,0.25 or 0.5MG/DOS, (OZEMPIC, 0.25 OR 0.5 MG/DOSE,) 2 MG/1.5ML SOPN Inject 0.5 mg into the skin once a week.  . triamterene-hydrochlorothiazide  (MAXZIDE-25) 37.5-25 MG tablet TAKE 1 TABLET BY MOUTH EVERY DAY   No facility-administered encounter medications on file as of 02/08/2020.    Activities of Daily Living In your present state of health, do you have any difficulty performing the following activities: 02/08/2020 02/08/2020  Hearing? N N  Vision? N N  Difficulty concentrating or making decisions? N N  Walking or climbing stairs? N N  Dressing or bathing? N N  Doing errands, shopping? N N  Preparing Food and eating ? N -  Using the Toilet? N -  In the past six months, have you accidently leaked urine? N -  Do you have problems with loss of bowel control? N -  Managing your Medications? N -  Managing your Finances? N -  Housekeeping or managing your  Housekeeping? N -  Some recent data might be hidden    Patient Care Team: [name], MD as PCP - General (Internal Medicine)   Assessment:   This is a routine wellness examination for [facility].  Exercise Activities and Dietary recommendations Current Exercise Habits: Home exercise routine, Type of exercise: walking, Time (Minutes): 30, Frequency (Times/Week): 4, Weekly Exercise (Minutes/Week): 120  Goals    . Patient Stated     To stay healthy    . Patient Stated     02/08/2020, wants to get out the house more       Fall Risk Fall Risk  02/08/2020 05/25/2019 02/07/2019 02/01/2019 10/06/2018  Falls in the past year? 0 0 0 0 0  Risk for fall due to : Medication side effect - - Medication side effect -  Follow up Falls evaluation completed;Education provided;Falls prevention discussed - - Falls prevention discussed -   Is the patient's home free of loose throw rugs in walkways, pet beds, electrical cords, etc?   yes      Grab bars in the bathroom? no      Handrails on the stairs?   no      Adequate lighting?   yes  Timed Get Up and Go Performed:  n/a  Depression Screen PHQ 2/9 Scores 02/08/2020 02/08/2020 02/07/2019 02/01/2019  PHQ - 2 Score 0 0 0 0  PHQ- 9 Score 0 - - 0     Cognitive Function     6CIT Screen 02/08/2020 02/01/2019  What Year? 0 points 0 points  What month? 0 points 0 points  What time? 0 points 0 points  Count back from 20 0 points 0 points  Months in reverse 0 points 0 points  Repeat phrase 2 points 0 points  Total Score 2 0    Immunization History  Administered Date(s) Administered  . Hepatitis B, adult 08/20/2015, 09/17/2015, 02/18/2016  . HiB (PRP-T) 02/08/2015  . Meningococcal Conjugate 11/09/2014  . PFIZER SARS-COV-2 Vaccination 01/07/2020, 01/30/2020  . Pneumococcal Conjugate-13 11/09/2014  . Pneumococcal Polysaccharide-23 02/08/2015    Qualifies for Shingles Vaccine? n/a  Screening Tests Health Maintenance  Topic Date Due  . OPHTHALMOLOGY EXAM  01/19/2020  . FOOT EXAM  02/07/2020  . HEMOGLOBIN A1C  03/12/2020  . INFLUENZA VACCINE  04/29/2020  . TETANUS/TDAP  06/08/2024  . PNEUMOCOCCAL POLYSACCHARIDE VACCINE AGE 36-64 HIGH RISK  Completed  . COVID-19 Vaccine  Completed  . HIV Screening  Completed   Cancer Screenings: Lung: Low Dose CT Chest recommended if Age 65-80 years, 30 pack-year currently smoking OR have quit w/in 15years. Patient does not qualify. Colorectal: n/a  Additional Screenings:  Hepatitis C Screening:n/a      Plan:    Patient is looking to get out of the house more.  I have personally reviewed and noted the following in the patient's chart:   . Medical and social history . Use of alcohol, tobacco or illicit drugs  . Current medications and supplements . Functional ability and status . Nutritional status . Physical activity . Advanced directives . List of other physicians . Hospitalizations, surgeries, and ER visits in previous 12 months . Vitals . Screenings to include cognitive, depression, and falls . Referrals and appointments  In addition, I have reviewed and discussed with patient certain preventive protocols, quality metrics, and best practice recommendations. A written  personalized care plan for preventive services as well as general preventive health recommendations were provided to patient.     Kellie Simmering,  LPN  01/01/5912

## 2020-02-08 NOTE — Patient Instructions (Signed)
[name] , Thank you for taking time to come for your Medicare Wellness Visit. I appreciate your ongoing commitment to your health goals. Please review the following plan we discussed and let me know if I can assist you in the future.   Screening recommendations/referrals: Colonoscopy: n/a Recommended yearly ophthalmology/optometry visit for glaucoma screening and checkup Recommended yearly dental visit for hygiene and checkup  Vaccinations: Influenza vaccine: decline Pneumococcal vaccine: 01/2015 Tdap vaccine: 05/2014 Shingles vaccine: n/a    Advanced directives: Advance directive discussed with you today. Even though you declined this today please call our office should you change your mind and we can give you the proper paperwork for you to fill out.  Conditions/risks identified: obesity  Next appointment: 08/13/2020 at 2:30  Preventive Care 40-64 Years, Male Preventive care refers to lifestyle choices and visits with your health care provider that can promote health and wellness. What does preventive care include?  A yearly physical exam. This is also called an annual well check.  Dental exams once or twice a year.  Routine eye exams. Ask your health care provider how often you should have your eyes checked.  Personal lifestyle choices, including:  Daily care of your teeth and gums.  Regular physical activity.  Eating a healthy diet.  Avoiding tobacco and drug use.  Limiting alcohol use.  Practicing safe sex.  Taking low-dose aspirin every day starting at age 50. What happens during an annual well check? The services and screenings done by your health care provider during your annual well check will depend on your age, overall health, lifestyle risk factors, and family history of disease. Counseling  Your health care provider may ask you questions about your:  Alcohol use.  Tobacco use.  Drug use.  Emotional well-being.  Home and relationship  well-being.  Sexual activity.  Eating habits.  Work and work Astronomer. Screening  You may have the following tests or measurements:  Height, weight, and BMI.  Blood pressure.  Lipid and cholesterol levels. These may be checked every 5 years, or more frequently if you are over 57 years old.  Skin check.  Lung cancer screening. You may have this screening every year starting at age 57 if you have a 30-pack-year history of smoking and currently smoke or have quit within the past 15 years.  Fecal occult blood test (FOBT) of the stool. You may have this test every year starting at age 39.  Flexible sigmoidoscopy or colonoscopy. You may have a sigmoidoscopy every 5 years or a colonoscopy every 10 years starting at age 13.  Prostate cancer screening. Recommendations will vary depending on your family history and other risks.  Hepatitis C blood test.  Hepatitis B blood test.  Sexually transmitted disease (STD) testing.  Diabetes screening. This is done by checking your blood sugar (glucose) after you have not eaten for a while (fasting). You may have this done every 1-3 years. Discuss your test results, treatment options, and if necessary, the need for more tests with your health care provider. Vaccines  Your health care provider may recommend certain vaccines, such as:  Influenza vaccine. This is recommended every year.  Tetanus, diphtheria, and acellular pertussis (Tdap, Td) vaccine. You may need a Td booster every 10 years.  Zoster vaccine. You may need this after age 86.  Pneumococcal 13-valent conjugate (PCV13) vaccine. You may need this if you have certain conditions and have not been vaccinated.  Pneumococcal polysaccharide (PPSV23) vaccine. You may need one or two doses  if you smoke cigarettes or if you have certain conditions. Talk to your health care provider about which screenings and vaccines you need and how often you need them. This information is not intended  to replace advice given to you by your health care provider. Make sure you discuss any questions you have with your health care provider. Document Released: 10/12/2015 Document Revised: 06/04/2016 Document Reviewed: 07/17/2015 Elsevier Interactive Patient Education  2017 [place] Prevention in the Home Falls can cause injuries. They can happen to people of all ages. There are many things you can do to make your home safe and to help prevent falls. What can I do on the outside of my home?  Regularly fix the edges of walkways and driveways and fix any cracks.  Remove anything that might make you trip as you walk through a door, such as a raised step or threshold.  Trim any bushes or trees on the path to your home.  Use bright outdoor lighting.  Clear any walking paths of anything that might make someone trip, such as rocks or tools.  Regularly check to see if handrails are loose or broken. Make sure that both sides of any steps have handrails.  Any raised decks and porches should have guardrails on the edges.  Have any leaves, snow, or ice cleared regularly.  Use sand or salt on walking paths during winter.  Clean up any spills in your garage right away. This includes oil or grease spills. What can I do in the bathroom?  Use night lights.  Install grab bars by the toilet and in the tub and shower. Do not use towel bars as grab bars.  Use non-skid mats or decals in the tub or shower.  If you need to sit down in the shower, use a plastic, non-slip stool.  Keep the floor dry. Clean up any water that spills on the floor as soon as it happens.  Remove soap buildup in the tub or shower regularly.  Attach bath mats securely with double-sided non-slip rug tape.  Do not have throw rugs and other things on the floor that can make you trip. What can I do in the bedroom?  Use night lights.  Make sure that you have a light by your bed that is easy to reach.  Do not use  any sheets or blankets that are too big for your bed. They should not hang down onto the floor.  Have a firm chair that has side arms. You can use this for support while you get dressed.  Do not have throw rugs and other things on the floor that can make you trip. What can I do in the kitchen?  Clean up any spills right away.  Avoid walking on wet floors.  Keep items that you use a lot in easy-to-reach places.  If you need to reach something above you, use a strong step stool that has a grab bar.  Keep electrical cords out of the way.  Do not use floor polish or wax that makes floors slippery. If you must use wax, use non-skid floor wax.  Do not have throw rugs and other things on the floor that can make you trip. What can I do with my stairs?  Do not leave any items on the stairs.  Make sure that there are handrails on both sides of the stairs and use them. Fix handrails that are broken or loose. Make sure that handrails are as long  as the stairways.  Check any carpeting to make sure that it is firmly attached to the stairs. Fix any carpet that is loose or worn.  Avoid having throw rugs at the top or bottom of the stairs. If you do have throw rugs, attach them to the floor with carpet tape.  Make sure that you have a light switch at the top of the stairs and the bottom of the stairs. If you do not have them, ask someone to add them for you. What else can I do to help prevent falls?  Wear shoes that:  Do not have high heels.  Have rubber bottoms.  Are comfortable and fit you well.  Are closed at the toe. Do not wear sandals.  If you use a stepladder:  Make sure that it is fully opened. Do not climb a closed stepladder.  Make sure that both sides of the stepladder are locked into place.  Ask someone to hold it for you, if possible.  Clearly mark and make sure that you can see:  Any grab bars or handrails.  First and last steps.  Where the edge of each step  is.  Use tools that help you move around (mobility aids) if they are needed. These include:  Canes.  Walkers.  Scooters.  Crutches.  Turn on the lights when you go into a dark area. Replace any light bulbs as soon as they burn out.  Set up your furniture so you have a clear path. Avoid moving your furniture around.  If any of your floors are uneven, fix them.  If there are any pets around you, be aware of where they are.  Review your medicines with your doctor. Some medicines can make you feel dizzy. This can increase your chance of falling. Ask your doctor what other things that you can do to help prevent falls. This information is not intended to replace advice given to you by your health care provider. Make sure you discuss any questions you have with your health care provider. Document Released: 07/12/2009 Document Revised: 02/21/2016 Document Reviewed: 10/20/2014 Elsevier Interactive Patient Education  2017 [employer].

## 2020-02-08 NOTE — Progress Notes (Signed)
This visit occurred during the SARS-CoV-2 public health emergency.  Safety protocols were in place, including screening questions prior to the visit, additional usage of staff PPE, and extensive cleaning of exam room while observing appropriate contact time as indicated for disinfecting solutions.  Subjective:     Patient ID: Nicolas [name] , male    DOB: November 02, 1975 , 44 y.o.   MRN: [id number]   Chief Complaint  Patient presents with  . Annual Exam  . Diabetes  . Hypertension    HPI  He is here today for a full physical exam. He has no specific concerns or complaints at this time. He does add that his wife is trying to get pregnant. She has been trying for the past year. He wants to have evaluation to see if his sperm count is an issue.   Diabetes He presents for his follow-up diabetic visit. He has type 2 diabetes mellitus. There are no hypoglycemic associated symptoms. Pertinent negatives for diabetes include no blurred vision and no chest pain. There are no hypoglycemic complications. Diabetic complications include nephropathy. Risk factors for coronary artery disease include diabetes mellitus, dyslipidemia, hypertension, male sex, sedentary lifestyle and obesity. Current diabetic treatment includes oral agent (dual therapy). He is following a diabetic diet. He participates in exercise intermittently. His home blood glucose trend is fluctuating minimally. His breakfast blood glucose is taken between 8-9 am. His breakfast blood glucose range is generally 110-130 mg/dl. An ACE inhibitor/angiotensin II receptor blocker is being taken. He does not see a podiatrist.Eye exam is current.  Hypertension This is a chronic problem. The current episode started more than 1 year ago. The problem has been gradually improving since onset. The problem is controlled. Pertinent negatives include no blurred vision, chest pain, palpitations or shortness of breath. Past treatments include ACE inhibitors, calcium  channel blockers and beta blockers. The current treatment provides moderate improvement. Compliance problems include exercise.  Hypertensive end-organ damage includes kidney disease.     Past Medical History:  Diagnosis Date  . Arthritis    arthritis- back & L hip  . Depression    situational depression, pt. out of work   . Diabetes mellitus ([place])   . HTN (hypertension) 05/10/2015  . Osteoarthritis of left hip 06/07/2012  . Sickle cell anemia (HCC)      Family History  Problem Relation Age of Onset  . Cancer - Other Father   . Liver cancer Father   . Diabetes Mother   . CAD Neg Hx      Current Outpatient Medications:  .  amLODipine (NORVASC) 10 MG tablet, TAKE 1 TABLET BY MOUTH EVERY DAY, Disp: 90 tablet, Rfl: 1 .  carvedilol (COREG) 6.25 MG tablet, TAKE 1 TABLET BY MOUTH TWICE A DAY WITH MEALS, Disp: 180 tablet, Rfl: 1 .  Cholecalciferol (VITAMIN D3) 2000 units capsule, Take 2,000 Units by mouth daily., Disp: , Rfl: 5 .  cyclobenzaprine (FLEXERIL) 10 MG tablet, Take 10 mg by mouth 3 (three) times daily as needed for muscle spasms., Disp: , Rfl:  .  folic acid (FOLVITE) 1 MG tablet, Take 1 mg by mouth daily., Disp: , Rfl: 11 .  glucose blood test strip, Use as instructed (Patient taking differently: 1 each by Other route every other day. Use as instructed), Disp: 100 each, Rfl: 12 .  glucose monitoring kit (FREESTYLE) monitoring kit, Glucometer- ACCUCHECK BRAND, Disp: 1 each, Rfl: 0 .  Lancets (ACCU-CHEK MULTICLIX) lancets, Use as instructed (Patient taking differently: 1 each by Other  route every other day. ), Disp: 100 each, Rfl: 12 .  lisinopril (ZESTRIL) 10 MG tablet, TAKE 2 TABLETS BY MOUTH EVERY DAY, Disp: 180 tablet, Rfl: 2 .  metFORMIN (GLUCOPHAGE) 500 MG tablet, TAKE 1 TABLET BY MOUTH TWICE A DAY, Disp: 180 tablet, Rfl: 2 .  oxyCODONE-acetaminophen (PERCOCET) 7.5-325 MG tablet, Take 1 tablet by mouth every 4 (four) hours as needed for severe pain., Disp: , Rfl:  .   pravastatin (PRAVACHOL) 20 MG tablet, TAKE 1 TABLET BY MOUTH EVERY DAY IN THE EVENING, Disp: 90 tablet, Rfl: 0 .  Semaglutide,0.25 or 0.5MG/DOS, (OZEMPIC, 0.25 OR 0.5 MG/DOSE,) 2 MG/1.5ML SOPN, Inject 0.5 mg into the skin once a week., Disp: 3 pen, Rfl: 2 .  triamterene-hydrochlorothiazide (MAXZIDE-25) 37.5-25 MG tablet, TAKE 1 TABLET BY MOUTH EVERY DAY, Disp: 90 tablet, Rfl: 1   No Known Allergies   Men's preventive visit. Patient Health Questionnaire (PHQ-2) is    Office Visit from 02/08/2020 in [facility]  PHQ-2 Total Score  0    . Patient is on a healthy diet. Marital status: Married. Relevant history for alcohol use is:  Social History   Substance and Sexual Activity  Alcohol Use Not Currently   Comment: ocassional   . Relevant history for tobacco use is:  Social History   Tobacco Use  Smoking Status Never Smoker  Smokeless Tobacco Never Used  .  Review of Systems  Constitutional: Negative.   HENT: Negative.   Eyes: Negative.  Negative for blurred vision.  Respiratory: Negative.  Negative for shortness of breath.   Cardiovascular: Negative.  Negative for chest pain and palpitations.  Endocrine: Negative.   Genitourinary: Positive for frequency.       He c/o nocturia - 2-3 times per night.   Musculoskeletal: Negative.   Skin: Negative.   Allergic/Immunologic: Negative.   Neurological: Negative.   Hematological: Negative.   Psychiatric/Behavioral: Negative.      Today's Vitals   02/08/20 1111  BP: 118/70  Pulse: 93  Temp: 98.6 F (37 C)  TempSrc: Oral  Weight: 224 lb (101.6 kg)  Height: 5' 8.2" (1.732 m)  PainSc: 6   PainLoc: Back   Body mass index is 33.86 kg/m.   Wt Readings from Last 3 Encounters:  02/08/20 224 lb (101.6 kg)  09/12/19 225 lb (102.1 kg)  05/25/19 225 lb (102.1 kg)     Objective:  Physical Exam Vitals and nursing note reviewed.  Constitutional:      Appearance: Normal appearance.  HENT:     Head:  Normocephalic and atraumatic.     Right Ear: Tympanic membrane, ear canal and external ear normal.     Left Ear: Tympanic membrane, ear canal and external ear normal.     Nose:     Comments: Deferred, masked    Mouth/Throat:     Comments: Deferred, masked Eyes:     Extraocular Movements: Extraocular movements intact.     Conjunctiva/sclera: Conjunctivae normal.     Pupils: Pupils are equal, round, and reactive to light.  Cardiovascular:     Rate and Rhythm: Normal rate and regular rhythm.     Pulses:          Dorsalis pedis pulses are 1+ on the right side and 1+ on the left side.     Heart sounds: Normal heart sounds.  Pulmonary:     Effort: Pulmonary effort is normal.     Breath sounds: Normal breath sounds.  Chest:  Breasts:        Right: Normal. No swelling, bleeding, inverted nipple, mass or nipple discharge.        Left: Normal. No swelling, bleeding, inverted nipple, mass or nipple discharge.  Abdominal:     General: Bowel sounds are normal.     Palpations: Abdomen is soft.     Comments: Rounded, soft  Genitourinary:    Comments: Deferred  Musculoskeletal:        General: Normal range of motion.     Cervical back: Normal range of motion and neck supple.  Feet:     Right foot:     Protective Sensation: 5 sites tested. 5 sites sensed.     Skin integrity: Callus and dry skin present.     Toenail Condition: Right toenails are abnormally thick.     Left foot:     Protective Sensation: 5 sites tested. 5 sites sensed.     Skin integrity: Callus and dry skin present.     Toenail Condition: Left toenails are abnormally thick.  Skin:    General: Skin is warm.  Neurological:     General: No focal deficit present.     Mental Status: He is alert.  Psychiatric:        Mood and Affect: Mood normal.        Behavior: Behavior normal.         Assessment And Plan:     1. Routine general medical examination at health care facility  A full exam was performed.  DRE  deferred. He would like referral to Urology for DRE. Admits he wants fertility evaluation as well. PATIENT IS ADVISED TO GET 30-45 MINUTES REGULAR EXERCISE NO LESS THAN FOUR TO FIVE DAYS PER WEEK - BOTH WEIGHTBEARING EXERCISES AND AEROBIC ARE RECOMMENDED.  HE IS ADVISED TO FOLLOW A HEALTHY DIET WITH AT LEAST SIX FRUITS/VEGGIES PER DAY, DECREASE INTAKE OF RED MEAT, AND TO INCREASE FISH INTAKE TO TWO DAYS PER WEEK.  MEATS/FISH SHOULD NOT BE FRIED, BAKED OR BROILED IS PREFERABLE.  I SUGGEST WEARING SPF 50 SUNSCREEN ON EXPOSED PARTS AND ESPECIALLY WHEN IN THE DIRECT SUNLIGHT FOR AN EXTENDED PERIOD OF TIME.  PLEASE AVOID FAST FOOD RESTAURANTS AND INCREASE YOUR WATER INTAKE.   2. Type 2 diabetes mellitus with stage 2 chronic kidney disease, without long-term current use of insulin ([place])  Diabetic foot exam was performed. I will check fructosamine level, along with hba1c given his h/o sickle cell anemia. I will request his most recent eye exam from ophthalmologist.  I DISCUSSED WITH THE PATIENT AT LENGTH REGARDING THE GOALS OF GLYCEMIC CONTROL AND POSSIBLE LONG-TERM COMPLICATIONS.  I  ALSO STRESSED THE IMPORTANCE OF COMPLIANCE WITH HOME GLUCOSE MONITORING, DIETARY RESTRICTIONS INCLUDING AVOIDANCE OF SUGARY DRINKS/PROCESSED FOODS,  ALONG WITH REGULAR EXERCISE.  I  ALSO STRESSED THE IMPORTANCE OF ANNUAL EYE EXAMS, SELF FOOT CARE AND COMPLIANCE WITH OFFICE VISITS.  - POCT Urinalysis Dipstick (81002) - POCT UA - Microalbumin - CMP14+EGFR - CBC - Lipid panel - Hemoglobin A1c  3. Hypertensive nephropathy  Chronic, well controlled. He will continue with current meds. He is encouraged to avoid adding salt to his foods. EKG performed, no new changes noted. He will rto in six months for re-evaluation.   - EKG 12-Lead  4. Nocturia  DRE deferred. I will also refer him to Urology for further evaluation.   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 Also followed by  Hematology.  6. Avascular necrosis (HCC)  Chronic, yet stable.   7. Class 1 obesity due to excess calories with serious comorbidity and body mass index (BMI) of 33.0 to 33.9 in adult  His initial goal is to achieve BMI less than 30 to decrease cardiac risk. He was congratulated on his lifestyle changes thus far.   [name], MD    THE PATIENT IS ENCOURAGED TO PRACTICE SOCIAL DISTANCING DUE TO THE COVID-19 PANDEMIC.

## 2020-02-09 LAB — CBC
Hematocrit: 34.9 % — ABNORMAL LOW (ref 37.5–51.0)
Hemoglobin: 11.5 g/dL — ABNORMAL LOW (ref 13.0–17.7)
MCH: 29.4 pg (ref 26.6–33.0)
MCHC: 33 g/dL (ref 31.5–35.7)
MCV: 89 fL (ref 79–97)
NRBC: 1 % — ABNORMAL HIGH (ref 0–0)
Platelets: 365 10*3/uL (ref 150–450)
RBC: 3.91 x10E6/uL — ABNORMAL LOW (ref 4.14–5.80)
RDW: 16.5 % — ABNORMAL HIGH (ref 11.6–15.4)
WBC: 8.6 10*3/uL (ref 3.4–10.8)

## 2020-02-09 LAB — PSA, TOTAL AND FREE
PSA, Free Pct: 31 %
PSA, Free: 0.31 ng/mL
Prostate Specific Ag, Serum: 1 ng/mL (ref 0.0–4.0)

## 2020-02-09 LAB — LIPID PANEL
Chol/HDL Ratio: 3.4 ratio (ref 0.0–5.0)
Cholesterol, Total: 132 mg/dL (ref 100–199)
HDL: 39 mg/dL — ABNORMAL LOW (ref >39)
LDL Chol Calc (NIH): 44 mg/dL (ref 0–99)
Triglycerides: 322 mg/dL — ABNORMAL HIGH (ref 0–149)
VLDL Cholesterol Cal: 49 mg/dL — ABNORMAL HIGH (ref 5–40)

## 2020-02-09 LAB — FRUCTOSAMINE: Fructosamine: 249 umol/L (ref 0–285)

## 2020-02-09 LAB — CMP14+EGFR
ALT: 36 [IU]/L (ref 0–44)
AST: 47 [IU]/L — ABNORMAL HIGH (ref 0–40)
Albumin/Globulin Ratio: 1.8 (ref 1.2–2.2)
Albumin: 4.9 g/dL (ref 4.0–5.0)
Alkaline Phosphatase: 58 [IU]/L (ref 39–117)
BUN/Creatinine Ratio: 15 (ref 9–20)
BUN: 19 mg/dL (ref 6–24)
Bilirubin Total: 0.8 mg/dL (ref 0.0–1.2)
CO2: 25 mmol/L (ref 20–29)
Calcium: 9.7 mg/dL (ref 8.7–10.2)
Chloride: 100 mmol/L (ref 96–106)
Creatinine, Ser: 1.31 mg/dL — ABNORMAL HIGH (ref 0.76–1.27)
GFR calc Af Amer: 75 mL/min/{1.73_m2}
GFR calc non Af Amer: 65 mL/min/{1.73_m2}
Globulin, Total: 2.8 g/dL (ref 1.5–4.5)
Glucose: 109 mg/dL — ABNORMAL HIGH (ref 65–99)
Potassium: 3.9 mmol/L (ref 3.5–5.2)
Sodium: 144 mmol/L (ref 134–144)
Total Protein: 7.7 g/dL (ref 6.0–8.5)

## 2020-02-13 NOTE — Patient Instructions (Signed)

## 2020-02-20 ENCOUNTER — Encounter: Payer: Self-pay | Admitting: Internal Medicine

## 2020-02-20 DIAGNOSIS — M8700 Idiopathic aseptic necrosis of unspecified bone: Secondary | ICD-10-CM | POA: Diagnosis not present

## 2020-02-20 DIAGNOSIS — Z23 Encounter for immunization: Secondary | ICD-10-CM | POA: Diagnosis not present

## 2020-02-20 DIAGNOSIS — E559 Vitamin D deficiency, unspecified: Secondary | ICD-10-CM | POA: Diagnosis not present

## 2020-02-20 DIAGNOSIS — G8929 Other chronic pain: Secondary | ICD-10-CM | POA: Diagnosis not present

## 2020-02-20 DIAGNOSIS — I080 Rheumatic disorders of both mitral and aortic valves: Secondary | ICD-10-CM | POA: Diagnosis not present

## 2020-02-21 ENCOUNTER — Other Ambulatory Visit: Payer: Self-pay | Admitting: Internal Medicine

## 2020-02-28 DIAGNOSIS — E119 Type 2 diabetes mellitus without complications: Secondary | ICD-10-CM | POA: Diagnosis not present

## 2020-02-28 DIAGNOSIS — I10 Essential (primary) hypertension: Secondary | ICD-10-CM | POA: Diagnosis not present

## 2020-02-28 DIAGNOSIS — Z03818 Encounter for observation for suspected exposure to other biological agents ruled out: Secondary | ICD-10-CM | POA: Diagnosis not present

## 2020-03-02 ENCOUNTER — Other Ambulatory Visit: Payer: Self-pay | Admitting: Internal Medicine

## 2020-05-16 ENCOUNTER — Other Ambulatory Visit: Payer: Self-pay | Admitting: Internal Medicine

## 2020-07-12 ENCOUNTER — Other Ambulatory Visit: Payer: Self-pay | Admitting: Cardiology

## 2020-07-30 ENCOUNTER — Other Ambulatory Visit: Payer: Self-pay | Admitting: Internal Medicine

## 2020-08-06 DIAGNOSIS — E559 Vitamin D deficiency, unspecified: Secondary | ICD-10-CM | POA: Diagnosis not present

## 2020-08-06 DIAGNOSIS — Z7984 Long term (current) use of oral hypoglycemic drugs: Secondary | ICD-10-CM | POA: Diagnosis not present

## 2020-08-06 DIAGNOSIS — Z791 Long term (current) use of non-steroidal anti-inflammatories (NSAID): Secondary | ICD-10-CM | POA: Diagnosis not present

## 2020-08-06 DIAGNOSIS — Z79899 Other long term (current) drug therapy: Secondary | ICD-10-CM | POA: Diagnosis not present

## 2020-08-06 DIAGNOSIS — G8929 Other chronic pain: Secondary | ICD-10-CM | POA: Diagnosis not present

## 2020-08-06 DIAGNOSIS — Z23 Encounter for immunization: Secondary | ICD-10-CM | POA: Diagnosis not present

## 2020-08-13 ENCOUNTER — Other Ambulatory Visit: Payer: Self-pay

## 2020-08-13 ENCOUNTER — Encounter: Payer: Self-pay | Admitting: Internal Medicine

## 2020-08-13 ENCOUNTER — Ambulatory Visit (INDEPENDENT_AMBULATORY_CARE_PROVIDER_SITE_OTHER): Payer: Medicare Other | Admitting: Internal Medicine

## 2020-08-13 VITALS — BP 132/78 | HR 98 | Temp 98.8°F | Ht 68.2 in | Wt 232.2 lb

## 2020-08-13 DIAGNOSIS — Z2821 Immunization not carried out because of patient refusal: Secondary | ICD-10-CM

## 2020-08-13 DIAGNOSIS — N182 Chronic kidney disease, stage 2 (mild): Secondary | ICD-10-CM | POA: Diagnosis not present

## 2020-08-13 DIAGNOSIS — D571 Sickle-cell disease without crisis: Secondary | ICD-10-CM

## 2020-08-13 DIAGNOSIS — E1122 Type 2 diabetes mellitus with diabetic chronic kidney disease: Secondary | ICD-10-CM

## 2020-08-13 DIAGNOSIS — E559 Vitamin D deficiency, unspecified: Secondary | ICD-10-CM

## 2020-08-13 DIAGNOSIS — I129 Hypertensive chronic kidney disease with stage 1 through stage 4 chronic kidney disease, or unspecified chronic kidney disease: Secondary | ICD-10-CM | POA: Diagnosis not present

## 2020-08-13 DIAGNOSIS — Z6835 Body mass index (BMI) 35.0-35.9, adult: Secondary | ICD-10-CM

## 2020-08-13 NOTE — Patient Instructions (Signed)
Diabetes Mellitus and Exercise Exercising regularly is important for your overall health, especially when you have diabetes (diabetes mellitus). Exercising is not only about losing weight. It has many other health benefits, such as increasing muscle strength and bone density and reducing body fat and stress. This leads to improved fitness, flexibility, and endurance, all of which result in better overall health. Exercise has additional benefits for people with diabetes, including:  Reducing appetite.  Helping to lower and control blood glucose.  Lowering blood pressure.  Helping to control amounts of fatty substances (lipids) in the blood, such as cholesterol and triglycerides.  Helping the body to respond better to insulin (improving insulin sensitivity).  Reducing how much insulin the body needs.  Decreasing the risk for heart disease by: ? Lowering cholesterol and triglyceride levels. ? Increasing the levels of good cholesterol. ? Lowering blood glucose levels. What is my activity plan? Your health care provider or certified diabetes educator can help you make a plan for the type and frequency of exercise (activity plan) that works for you. Make sure that you:  Do at least 150 minutes of moderate-intensity or vigorous-intensity exercise each week. This could be brisk walking, biking, or water aerobics. ? Do stretching and strength exercises, such as yoga or weightlifting, at least 2 times a week. ? Spread out your activity over at least 3 days of the week.  Get some form of physical activity every day. ? Do not go more than 2 days in a row without some kind of physical activity. ? Avoid being inactive for more than 30 minutes at a time. Take frequent breaks to walk or stretch.  Choose a type of exercise or activity that you enjoy, and set realistic goals.  Start slowly, and gradually increase the intensity of your exercise over time. What do I need to know about managing my  diabetes?   Check your blood glucose before and after exercising. ? If your blood glucose is 240 mg/dL (13.3 mmol/L) or higher before you exercise, check your urine for ketones. If you have ketones in your urine, do not exercise until your blood glucose returns to normal. ? If your blood glucose is 100 mg/dL (5.6 mmol/L) or lower, eat a snack containing 15-20 grams of carbohydrate. Check your blood glucose 15 minutes after the snack to make sure that your level is above 100 mg/dL (5.6 mmol/L) before you start your exercise.  Know the symptoms of low blood glucose (hypoglycemia) and how to treat it. Your risk for hypoglycemia increases during and after exercise. Common symptoms of hypoglycemia can include: ? Hunger. ? Anxiety. ? Sweating and feeling clammy. ? Confusion. ? Dizziness or feeling light-headed. ? Increased heart rate or palpitations. ? Blurry vision. ? Tingling or numbness around the mouth, lips, or tongue. ? Tremors or shakes. ? Irritability.  Keep a rapid-acting carbohydrate snack available before, during, and after exercise to help prevent or treat hypoglycemia.  Avoid injecting insulin into areas of the body that are going to be exercised. For example, avoid injecting insulin into: ? The arms, when playing tennis. ? The legs, when jogging.  Keep records of your exercise habits. Doing this can help you and your health care provider adjust your diabetes management plan as needed. Write down: ? Food that you eat before and after you exercise. ? Blood glucose levels before and after you exercise. ? The type and amount of exercise you have done. ? When your insulin is expected to peak, if you use   insulin. Avoid exercising at times when your insulin is peaking.  When you start a new exercise or activity, work with your health care provider to make sure the activity is safe for you, and to adjust your insulin, medicines, or food intake as needed.  Drink plenty of water while  you exercise to prevent dehydration or heat stroke. Drink enough fluid to keep your urine clear or pale yellow. Summary  Exercising regularly is important for your overall health, especially when you have diabetes (diabetes mellitus).  Exercising has many health benefits, such as increasing muscle strength and bone density and reducing body fat and stress.  Your health care provider or certified diabetes educator can help you make a plan for the type and frequency of exercise (activity plan) that works for you.  When you start a new exercise or activity, work with your health care provider to make sure the activity is safe for you, and to adjust your insulin, medicines, or food intake as needed. This information is not intended to replace advice given to you by your health care provider. Make sure you discuss any questions you have with your health care provider. Document Revised: 04/09/2017 Document Reviewed: 02/25/2016 Elsevier Patient Education  2020 [employer]  

## 2020-08-13 NOTE — Progress Notes (Signed)
I,Tianna Badgett,acting as a Education administrator for [name], MD.,have documented all relevant documentation on the behalf of [name], MD,as directed by  [name], MD while in the presence of [name], MD.  This visit occurred during the SARS-CoV-2 public health emergency.  Safety protocols were in place, including screening questions prior to the visit, additional usage of staff PPE, and extensive cleaning of exam room while observing appropriate contact time as indicated for disinfecting solutions.  Subjective:     Patient ID: Nicolas [name] , male    DOB: [birth year] , 44 y.o.   MRN: [id number]   Chief Complaint  Patient presents with  . Diabetes  . Hypertension    HPI  He presents today for diabetes and HTN follow-up. He reports compliance with meds. He states he exercises on a regular basis.   Diabetes He presents for his follow-up diabetic visit. He has type 2 diabetes mellitus. There are no hypoglycemic associated symptoms. Pertinent negatives for diabetes include no blurred vision and no chest pain. There are no hypoglycemic complications. Diabetic complications include nephropathy. Risk factors for coronary artery disease include diabetes mellitus, dyslipidemia, hypertension, male sex, sedentary lifestyle and obesity. Current diabetic treatment includes oral agent (dual therapy). He is following a diabetic diet. He participates in exercise intermittently. His home blood glucose trend is fluctuating minimally. His breakfast blood glucose is taken between 8-9 am. His breakfast blood glucose range is generally 110-130 mg/dl. An ACE inhibitor/angiotensin II receptor blocker is being taken.  Hypertension This is a chronic problem. The current episode started more than 1 year ago. The problem has been gradually improving since onset. The problem is controlled. Pertinent negatives include no blurred vision, chest pain, palpitations or shortness of breath. Past treatments include  ACE inhibitors, calcium channel blockers and beta blockers. The current treatment provides moderate improvement. Compliance problems include exercise.  Hypertensive end-organ damage includes kidney disease.     Past Medical History:  Diagnosis Date  . Arthritis    arthritis- back & L hip  . Depression    situational depression, pt. out of work   . Diabetes mellitus ([place])   . HTN (hypertension) 05/10/2015  . Osteoarthritis of left hip 06/07/2012  . Sickle cell anemia (HCC)      Family History  Problem Relation Age of Onset  . Cancer - Other Father   . Liver cancer Father   . Diabetes Mother   . CAD Neg Hx      Current Outpatient Medications:  .  amLODipine (NORVASC) 10 MG tablet, TAKE 1 TABLET BY MOUTH EVERY DAY, Disp: 90 tablet, Rfl: 1 .  carvedilol (COREG) 6.25 MG tablet, TAKE 1 TABLET BY MOUTH TWICE A DAY WITH MEALS, Disp: 180 tablet, Rfl: 1 .  Cholecalciferol (VITAMIN D3) 2000 units capsule, Take 2,000 Units by mouth daily., Disp: , Rfl: 5 .  cyclobenzaprine (FLEXERIL) 10 MG tablet, Take 10 mg by mouth 3 (three) times daily as needed for muscle spasms., Disp: , Rfl:  .  folic acid (FOLVITE) 1 MG tablet, Take 1 mg by mouth daily., Disp: , Rfl: 11 .  glucose blood test strip, Use as instructed (Patient taking differently: 1 each by Other route every other day. Use as instructed), Disp: 100 each, Rfl: 12 .  glucose monitoring kit (FREESTYLE) monitoring kit, Glucometer- ACCUCHECK BRAND, Disp: 1 each, Rfl: 0 .  Lancets (ACCU-CHEK MULTICLIX) lancets, Use as instructed (Patient taking differently: 1 each by Other route every other day. ),  Disp: 100 each, Rfl: 12 .  lisinopril (ZESTRIL) 10 MG tablet, TAKE 2 TABLETS BY MOUTH EVERY DAY, Disp: 180 tablet, Rfl: 2 .  metFORMIN (GLUCOPHAGE) 500 MG tablet, TAKE 1 TABLET BY MOUTH TWICE A DAY, Disp: 180 tablet, Rfl: 2 .  oxyCODONE-acetaminophen (PERCOCET) 7.5-325 MG tablet, Take 1 tablet by mouth every 4 (four) hours as needed for severe pain.,  Disp: , Rfl:  .  OZEMPIC, 0.25 OR 0.5 MG/DOSE, 2 MG/1.5ML SOPN, INJECT 0.5 MG INTO THE SKIN ONCE A WEEK., Disp: 4.5 mL, Rfl: 1 .  triamterene-hydrochlorothiazide (MAXZIDE-25) 37.5-25 MG tablet, TAKE 1 TABLET BY MOUTH EVERY DAY, Disp: 90 tablet, Rfl: 1 .  Multiple Vitamin (ONE-A-DAY MENS PO), Take by mouth. 1 daily, Disp: , Rfl:  .  OXBRYTA 500 MG TABS tablet, Take by mouth., Disp: , Rfl:  .  pravastatin (PRAVACHOL) 20 MG tablet, TAKE 1 TABLET BY MOUTH EVERY DAY IN THE EVENING, Disp: 90 tablet, Rfl: 0   No Known Allergies   Review of Systems  Constitutional: Negative.   Eyes: Negative for blurred vision.  Respiratory: Negative.  Negative for shortness of breath.   Cardiovascular: Negative.  Negative for chest pain and palpitations.  Gastrointestinal: Negative.   Neurological: Negative.      Today's Vitals   08/13/20 1415  BP: 132/78  Pulse: 98  Temp: 98.8 F (37.1 C)  TempSrc: Oral  Weight: 232 lb 3.2 oz (105.3 kg)  Height: 5' 8.2" (1.732 m)   Body mass index is 35.1 kg/m.  Wt Readings from Last 3 Encounters:  08/13/20 232 lb 3.2 oz (105.3 kg)  02/08/20 224 lb (101.6 kg)  02/08/20 224 lb (101.6 kg)    Objective:  Physical Exam Vitals and nursing note reviewed.  Constitutional:      Appearance: Normal appearance. He is obese.  Cardiovascular:     Rate and Rhythm: Normal rate and regular rhythm.     Heart sounds: Normal heart sounds.  Pulmonary:     Effort: Pulmonary effort is normal.     Breath sounds: Normal breath sounds.  Skin:    General: Skin is warm.  Neurological:     General: No focal deficit present.     Mental Status: He is alert.  Psychiatric:        Mood and Affect: Mood normal.         Assessment And Plan:     1. Type 2 diabetes mellitus with stage 2 chronic kidney disease, without long-term current use of insulin (HCC) Comments: Chronic, I will check labs as listed below. Encouraged to exercise no less than 150 minutes per week. Advised to get  eye exam by end of month.  - Hemoglobin A1c - CMP14+EGFR - TSH - Fructosamine  2. Hypertensive nephropathy Comments: Chronic, fair control. He will continue with current meds. Encouraged to avoid adding salt to his foods.   3. Sickle cell disease without crisis ([place]) Comments: Chronic, followed by Hematology.  He is encouraged to stay well hydrated.  - CBC no Diff - Reticulocytes Count  4. Vitamin D deficiency Comments: I will check vitamin D level and supplement as needed.  - Vitamin D (25 hydroxy)  5. Class 2 severe obesity due to excess calories with serious comorbidity and body mass index (BMI) of 35.0 to 35.9 in adult [facility]) Comments: He is encouraged to strive for BMI less than 30 to decrease cardiac risk. Advised to incorporate more cardio into his daily routine.   6. Influenza vaccination declined  Patient was given opportunity to ask questions. Patient verbalized understanding of the plan and was able to repeat key elements of the plan. All questions were answered to their satisfaction.  [name], MD   I, [name], MD, have reviewed all documentation for this visit. The documentation on 08/26/20 for the exam, diagnosis, procedures, and orders are all accurate and complete.  THE PATIENT IS ENCOURAGED TO PRACTICE SOCIAL DISTANCING DUE TO THE COVID-19 PANDEMIC.

## 2020-08-14 ENCOUNTER — Other Ambulatory Visit: Payer: Self-pay | Admitting: Internal Medicine

## 2020-08-14 LAB — CBC
Hematocrit: 36 % — ABNORMAL LOW (ref 37.5–51.0)
Hemoglobin: 11.7 g/dL — ABNORMAL LOW (ref 13.0–17.7)
MCH: 25.3 pg — ABNORMAL LOW (ref 26.6–33.0)
MCHC: 32.5 g/dL (ref 31.5–35.7)
MCV: 78 fL — ABNORMAL LOW (ref 79–97)
NRBC: 1 % — ABNORMAL HIGH (ref 0–0)
Platelets: 432 10*3/uL (ref 150–450)
RBC: 4.63 x10E6/uL (ref 4.14–5.80)
RDW: 21.1 % — ABNORMAL HIGH (ref 11.6–15.4)
WBC: 8.7 10*3/uL (ref 3.4–10.8)

## 2020-08-14 LAB — CMP14+EGFR
ALT: 79 IU/L — ABNORMAL HIGH (ref 0–44)
AST: 86 IU/L — ABNORMAL HIGH (ref 0–40)
Albumin/Globulin Ratio: 1.7 (ref 1.2–2.2)
Albumin: 4.8 g/dL (ref 4.0–5.0)
Alkaline Phosphatase: 62 IU/L (ref 44–121)
BUN/Creatinine Ratio: 13 (ref 9–20)
BUN: 14 mg/dL (ref 6–24)
Bilirubin Total: 0.4 mg/dL (ref 0.0–1.2)
CO2: 24 mmol/L (ref 20–29)
Calcium: 9.3 mg/dL (ref 8.7–10.2)
Chloride: 104 mmol/L (ref 96–106)
Creatinine, Ser: 1.12 mg/dL (ref 0.76–1.27)
GFR calc Af Amer: 91 mL/min/{1.73_m2} (ref >59)
GFR calc non Af Amer: 78 mL/min/{1.73_m2} (ref >59)
Globulin, Total: 2.9 g/dL (ref 1.5–4.5)
Glucose: 109 mg/dL — ABNORMAL HIGH (ref 65–99)
Potassium: 3.7 mmol/L (ref 3.5–5.2)
Sodium: 144 mmol/L (ref 134–144)
Total Protein: 7.7 g/dL (ref 6.0–8.5)

## 2020-08-14 LAB — HEMOGLOBIN A1C
Est. average glucose Bld gHb Est-mCnc: 85 mg/dL
Hgb A1c MFr Bld: 4.6 % — ABNORMAL LOW (ref 4.8–5.6)

## 2020-08-14 LAB — FRUCTOSAMINE: Fructosamine: 250 umol/L (ref 0–285)

## 2020-08-14 LAB — TSH: TSH: 2.89 u[IU]/mL (ref 0.450–4.500)

## 2020-08-14 LAB — VITAMIN D 25 HYDROXY (VIT D DEFICIENCY, FRACTURES): Vit D, 25-Hydroxy: 50.5 ng/mL (ref 30.0–100.0)

## 2020-08-14 LAB — RETICULOCYTES: Retic Ct Pct: 2.4 % (ref 0.6–2.6)

## 2020-08-17 ENCOUNTER — Telehealth: Payer: Self-pay

## 2020-08-17 NOTE — Telephone Encounter (Signed)
Left a detailed message at the pt's request. 

## 2020-08-17 NOTE — Telephone Encounter (Signed)
-----   Message from Dorothyann Peng, MD sent at 08/16/2020  7:33 PM EST ----- Your blood count is stable. Your kidney function is stable. Your liver enzymes are quite elevated. Do you drink alcohol? If yes, stop. Are you taking any over the counter meds like Advil, Tylenol? We need to recheck this in four weeks. It is important to also cut out sugary drinks, including diet and refined carbs including desserts.   Your thyroid function is normal. Your vitamin D level is normal. Continue with current supplementation. Your fructosamine level is pretty good. Be sure to continue with your regular exercise regimen.   Let me know if you have questions.   Take care,   RS

## 2020-08-20 ENCOUNTER — Other Ambulatory Visit: Payer: Self-pay

## 2020-08-27 ENCOUNTER — Other Ambulatory Visit: Payer: Self-pay

## 2020-08-27 MED ORDER — AMLODIPINE BESYLATE 10 MG PO TABS
10.0000 mg | ORAL_TABLET | Freq: Every day | ORAL | 1 refills | Status: DC
Start: 2020-08-27 — End: 2021-02-27

## 2020-09-10 ENCOUNTER — Other Ambulatory Visit: Payer: Self-pay

## 2020-09-10 DIAGNOSIS — R748 Abnormal levels of other serum enzymes: Secondary | ICD-10-CM

## 2020-09-11 LAB — HEPATIC FUNCTION PANEL
ALT: 73 IU/L — ABNORMAL HIGH (ref 0–44)
AST: 63 IU/L — ABNORMAL HIGH (ref 0–40)
Albumin: 4.6 g/dL (ref 4.0–5.0)
Alkaline Phosphatase: 74 IU/L (ref 44–121)
Bilirubin Total: 0.4 mg/dL (ref 0.0–1.2)
Bilirubin, Direct: 0.13 mg/dL (ref 0.00–0.40)
Total Protein: 7.6 g/dL (ref 6.0–8.5)

## 2020-09-12 DIAGNOSIS — I129 Hypertensive chronic kidney disease with stage 1 through stage 4 chronic kidney disease, or unspecified chronic kidney disease: Secondary | ICD-10-CM | POA: Diagnosis not present

## 2020-09-12 DIAGNOSIS — Z79899 Other long term (current) drug therapy: Secondary | ICD-10-CM | POA: Diagnosis not present

## 2020-09-12 DIAGNOSIS — Z791 Long term (current) use of non-steroidal anti-inflammatories (NSAID): Secondary | ICD-10-CM | POA: Diagnosis not present

## 2020-09-12 DIAGNOSIS — E1122 Type 2 diabetes mellitus with diabetic chronic kidney disease: Secondary | ICD-10-CM | POA: Diagnosis not present

## 2020-09-12 DIAGNOSIS — R808 Other proteinuria: Secondary | ICD-10-CM | POA: Diagnosis not present

## 2020-09-12 DIAGNOSIS — Z23 Encounter for immunization: Secondary | ICD-10-CM | POA: Diagnosis not present

## 2020-09-12 DIAGNOSIS — N183 Chronic kidney disease, stage 3 unspecified: Secondary | ICD-10-CM | POA: Diagnosis not present

## 2020-09-12 DIAGNOSIS — Z7984 Long term (current) use of oral hypoglycemic drugs: Secondary | ICD-10-CM | POA: Diagnosis not present

## 2020-09-28 ENCOUNTER — Other Ambulatory Visit: Payer: Self-pay | Admitting: Internal Medicine

## 2020-11-08 ENCOUNTER — Other Ambulatory Visit: Payer: Self-pay | Admitting: Internal Medicine

## 2020-11-13 ENCOUNTER — Encounter: Payer: Self-pay | Admitting: Internal Medicine

## 2020-11-13 ENCOUNTER — Ambulatory Visit (INDEPENDENT_AMBULATORY_CARE_PROVIDER_SITE_OTHER): Payer: Medicare Other | Admitting: Internal Medicine

## 2020-11-13 ENCOUNTER — Other Ambulatory Visit: Payer: Self-pay

## 2020-11-13 VITALS — BP 120/74 | HR 91 | Temp 98.0°F | Ht 68.2 in | Wt 230.8 lb

## 2020-11-13 DIAGNOSIS — I129 Hypertensive chronic kidney disease with stage 1 through stage 4 chronic kidney disease, or unspecified chronic kidney disease: Secondary | ICD-10-CM | POA: Diagnosis not present

## 2020-11-13 DIAGNOSIS — N182 Chronic kidney disease, stage 2 (mild): Secondary | ICD-10-CM

## 2020-11-13 DIAGNOSIS — N469 Male infertility, unspecified: Secondary | ICD-10-CM

## 2020-11-13 DIAGNOSIS — E1122 Type 2 diabetes mellitus with diabetic chronic kidney disease: Secondary | ICD-10-CM | POA: Diagnosis not present

## 2020-11-13 DIAGNOSIS — M8700 Idiopathic aseptic necrosis of unspecified bone: Secondary | ICD-10-CM

## 2020-11-13 DIAGNOSIS — Z6834 Body mass index (BMI) 34.0-34.9, adult: Secondary | ICD-10-CM

## 2020-11-13 DIAGNOSIS — D571 Sickle-cell disease without crisis: Secondary | ICD-10-CM

## 2020-11-13 DIAGNOSIS — E6609 Other obesity due to excess calories: Secondary | ICD-10-CM

## 2020-11-13 NOTE — Progress Notes (Signed)
I,Nicolas [name],acting as a Education administrator for Nicolas Greenland, MD.,have documented all relevant documentation on the behalf of Nicolas Greenland, MD,as directed by  Nicolas Greenland, MD while in the presence of Nicolas Greenland, MD.  This visit occurred during the SARS-CoV-2 public health emergency.  Safety protocols were in place, including screening questions prior to the visit, additional usage of staff PPE, and extensive cleaning of exam room while observing appropriate contact time as indicated for disinfecting solutions.  Subjective:     Patient ID: Nicolas [name] , male    DOB: Nov 07, 1975 , 45 y.o.   MRN: [id number]   Chief Complaint  Patient presents with  . Hypertension    HPI  He presents today for diabetes and HTN follow-up. He reports compliance with meds. He denies headaches, chest pain and shortness of breath.   Hypertension This is a chronic problem. The current episode started more than 1 year ago. The problem has been gradually improving since onset. The problem is controlled. Pertinent negatives include no blurred vision, chest pain, palpitations or shortness of breath. Past treatments include ACE inhibitors, calcium channel blockers and beta blockers. The current treatment provides moderate improvement. Compliance problems include exercise.  Hypertensive end-organ damage includes kidney disease.  Diabetes He presents for his follow-up diabetic visit. He has type 2 diabetes mellitus. There are no hypoglycemic associated symptoms. Pertinent negatives for diabetes include no blurred vision and no chest pain. There are no hypoglycemic complications. Diabetic complications include nephropathy. Risk factors for coronary artery disease include diabetes mellitus, dyslipidemia, hypertension, male sex, sedentary lifestyle and obesity. Current diabetic treatment includes oral agent (dual therapy). He is following a diabetic diet. He participates in exercise intermittently. His home blood glucose  trend is fluctuating minimally. His breakfast blood glucose is taken between 8-9 am. His breakfast blood glucose range is generally 110-130 mg/dl. An ACE inhibitor/angiotensin II receptor blocker is being taken.     Past Medical History:  Diagnosis Date  . Arthritis    arthritis- back & L hip  . Depression    situational depression, pt. out of work   . Diabetes mellitus ([place])   . HTN (hypertension) 05/10/2015  . Osteoarthritis of left hip 06/07/2012  . Sickle cell anemia (HCC)      Family History  Problem Relation Age of Onset  . Cancer - Other Father   . Liver cancer Father   . Diabetes Mother   . CAD Neg Hx      Current Outpatient Medications:  .  amLODipine (NORVASC) 10 MG tablet, Take 1 tablet (10 mg total) by mouth daily., Disp: 90 tablet, Rfl: 1 .  carvedilol (COREG) 6.25 MG tablet, TAKE 1 TABLET BY MOUTH TWICE A DAY WITH MEALS, Disp: 180 tablet, Rfl: 1 .  Cholecalciferol (VITAMIN D3) 2000 units capsule, Take 2,000 Units by mouth daily., Disp: , Rfl: 5 .  cyclobenzaprine (FLEXERIL) 10 MG tablet, Take 10 mg by mouth 3 (three) times daily as needed for muscle spasms., Disp: , Rfl:  .  folic acid (FOLVITE) 1 MG tablet, Take 1 mg by mouth daily., Disp: , Rfl: 11 .  glucose blood test strip, Use as instructed (Patient taking differently: 1 each by Other route every other day. Use as instructed), Disp: 100 each, Rfl: 12 .  Lancets (ACCU-CHEK MULTICLIX) lancets, Use as instructed (Patient taking differently: 1 each by Other route every other day.), Disp: 100 each, Rfl: 12 .  lisinopril (ZESTRIL) 10 MG tablet, TAKE 2 TABLETS BY MOUTH  EVERY DAY, Disp: 180 tablet, Rfl: 2 .  metFORMIN (GLUCOPHAGE) 500 MG tablet, TAKE 1 TABLET BY MOUTH TWICE A DAY, Disp: 180 tablet, Rfl: 2 .  Multiple Vitamin (ONE-A-DAY MENS PO), Take by mouth. 1 daily, Disp: , Rfl:  .  OXBRYTA 500 MG TABS tablet, Take by mouth., Disp: , Rfl:  .  oxyCODONE-acetaminophen (PERCOCET) 7.5-325 MG tablet, Take 1 tablet by mouth  every 4 (four) hours as needed for severe pain., Disp: , Rfl:  .  OZEMPIC, 0.25 OR 0.5 MG/DOSE, 2 MG/1.5ML SOPN, INJECT 0.5 MG INTO THE SKIN ONCE A WEEK., Disp: 4.5 mL, Rfl: 1 .  pravastatin (PRAVACHOL) 20 MG tablet, TAKE 1 TABLET BY MOUTH EVERY DAY IN THE EVENING, Disp: 90 tablet, Rfl: 0 .  triamterene-hydrochlorothiazide (MAXZIDE-25) 37.5-25 MG tablet, TAKE 1 TABLET BY MOUTH EVERY DAY, Disp: 90 tablet, Rfl: 1   No Known Allergies   Review of Systems  Constitutional: Negative.   Eyes: Negative for blurred vision.  Respiratory: Negative.  Negative for shortness of breath.   Cardiovascular: Negative.  Negative for chest pain and palpitations.  Gastrointestinal: Negative.   Psychiatric/Behavioral: Negative.   All other systems reviewed and are negative.    Today's Vitals   11/13/20 1409  BP: 120/74  Pulse: 91  Temp: 98 F (36.7 C)  TempSrc: Oral  Weight: 230 lb 12.8 oz (104.7 kg)  Height: 5' 8.2" (1.732 m)  PainSc: 8   PainLoc: Back   Body mass index is 34.89 kg/m.  Wt Readings from Last 3 Encounters:  11/13/20 230 lb 12.8 oz (104.7 kg)  08/13/20 232 lb 3.2 oz (105.3 kg)  02/08/20 224 lb (101.6 kg)   Objective:  Physical Exam Vitals and nursing note reviewed.  Constitutional:      Appearance: Normal appearance. He is obese.  HENT:     Head: Normocephalic and atraumatic.  Cardiovascular:     Rate and Rhythm: Normal rate and regular rhythm.     Heart sounds: Normal heart sounds.  Pulmonary:     Effort: Pulmonary effort is normal.     Breath sounds: Normal breath sounds.  Musculoskeletal:     Cervical back: Normal range of motion.  Skin:    General: Skin is warm.  Neurological:     General: No focal deficit present.     Mental Status: He is alert and oriented to person, place, and time.         Assessment And Plan:     1. Hypertensive nephropathy Comments: Chronic, well controlled. He will c/w amlodipine/carvedilol .He is encouraged to follow low sodium diet.    2. Type 2 diabetes mellitus with stage 2 chronic kidney disease, without long-term current use of insulin (HCC) Comments: Chronic. I will check fructosamine level due to h/o SCA.  - BMP8+EGFR - Fructosamine  3. Sickle cell disease without crisis ([place]) Comments: Chronic, he is currently stable.  - CBC with Diff - Reticulocytes Count  4. 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 He was given information for [facility] to contact them directly to schedule appt.   6. Class 1 obesity due to excess calories with serious comorbidity and body mass index (BMI) of 34.0 to 34.9 in adult  He is encouraged to initially strive for BMI less than 30 to decrease cardiac risk. He is advised to exercise no less than 150 minutes per week.   Patient was given opportunity to ask questions. Patient verbalized understanding of  the plan and was able to repeat key elements of the plan. All questions were answered to their satisfaction.  Nicolas Greenland, MD   I, Nicolas Greenland, MD, have reviewed all documentation for this visit. The documentation on 11/13/20 for the exam, diagnosis, procedures, and orders are all accurate and complete.  THE PATIENT IS ENCOURAGED TO PRACTICE SOCIAL DISTANCING DUE TO THE COVID-19 PANDEMIC.

## 2020-11-13 NOTE — Patient Instructions (Signed)

## 2020-11-14 LAB — CBC WITH DIFFERENTIAL/PLATELET
Basophils Absolute: 0 10*3/uL (ref 0.0–0.2)
Basos: 0 %
EOS (ABSOLUTE): 0.2 10*3/uL (ref 0.0–0.4)
Eos: 2 %
Hematocrit: 36.4 % — ABNORMAL LOW (ref 37.5–51.0)
Hemoglobin: 12 g/dL — ABNORMAL LOW (ref 13.0–17.7)
Immature Grans (Abs): 0 10*3/uL (ref 0.0–0.1)
Immature Granulocytes: 0 %
Lymphocytes Absolute: 2.2 10*3/uL (ref 0.7–3.1)
Lymphs: 28 %
MCH: 26.1 pg — ABNORMAL LOW (ref 26.6–33.0)
MCHC: 33 g/dL (ref 31.5–35.7)
MCV: 79 fL (ref 79–97)
Monocytes Absolute: 0.9 10*3/uL (ref 0.1–0.9)
Monocytes: 11 %
NRBC: 1 % — ABNORMAL HIGH (ref 0–0)
Neutrophils Absolute: 4.7 10*3/uL (ref 1.4–7.0)
Neutrophils: 59 %
Platelets: 334 10*3/uL (ref 150–450)
RBC: 4.59 x10E6/uL (ref 4.14–5.80)
RDW: 21.5 % — ABNORMAL HIGH (ref 11.6–15.4)
WBC: 7.9 10*3/uL (ref 3.4–10.8)

## 2020-11-14 LAB — BMP8+EGFR
BUN/Creatinine Ratio: 10 (ref 9–20)
BUN: 12 mg/dL (ref 6–24)
CO2: 26 mmol/L (ref 20–29)
Calcium: 9.3 mg/dL (ref 8.7–10.2)
Chloride: 100 mmol/L (ref 96–106)
Creatinine, Ser: 1.23 mg/dL (ref 0.76–1.27)
GFR calc Af Amer: 80 mL/min/{1.73_m2} (ref >59)
GFR calc non Af Amer: 69 mL/min/{1.73_m2} (ref >59)
Glucose: 126 mg/dL — ABNORMAL HIGH (ref 65–99)
Potassium: 3.5 mmol/L (ref 3.5–5.2)
Sodium: 142 mmol/L (ref 134–144)

## 2020-11-14 LAB — RETICULOCYTES: Retic Ct Pct: 2.8 % — ABNORMAL HIGH (ref 0.6–2.6)

## 2020-11-14 LAB — FRUCTOSAMINE: Fructosamine: 247 umol/L (ref 0–285)

## 2020-11-16 ENCOUNTER — Telehealth: Payer: Self-pay

## 2020-11-16 NOTE — Telephone Encounter (Signed)
-----   Message from Dorothyann Peng, MD sent at 11/15/2020  7:07 PM EST ----- Your kidney function is stable. Your fructosamine level is stable, but would improve more once you incorporate more exercise into your daily routine.  Your blood count is stable .

## 2020-11-16 NOTE — Telephone Encounter (Signed)
Left the patient a message to call back for lab results. 

## 2021-01-01 ENCOUNTER — Telehealth: Payer: Self-pay

## 2021-01-01 NOTE — Telephone Encounter (Signed)
Left the pt a message that I was retuning his call, the pt left a message that he needed a referral from his primary. I left the pt a message to call back.  I couldn't understand what he was saying on the voicemail.

## 2021-01-09 ENCOUNTER — Other Ambulatory Visit: Payer: Self-pay | Admitting: Internal Medicine

## 2021-01-18 DIAGNOSIS — E119 Type 2 diabetes mellitus without complications: Secondary | ICD-10-CM | POA: Diagnosis not present

## 2021-01-18 DIAGNOSIS — H538 Other visual disturbances: Secondary | ICD-10-CM | POA: Diagnosis not present

## 2021-01-19 ENCOUNTER — Other Ambulatory Visit: Payer: Self-pay | Admitting: Internal Medicine

## 2021-01-23 ENCOUNTER — Other Ambulatory Visit: Payer: Self-pay | Admitting: Internal Medicine

## 2021-02-06 LAB — HM HEPATITIS C SCREENING LAB: HM Hepatitis Screen: NEGATIVE

## 2021-02-13 ENCOUNTER — Ambulatory Visit: Payer: Medicare Other

## 2021-02-13 ENCOUNTER — Encounter: Payer: Self-pay | Admitting: Internal Medicine

## 2021-02-13 ENCOUNTER — Other Ambulatory Visit: Payer: Self-pay

## 2021-02-13 ENCOUNTER — Ambulatory Visit (INDEPENDENT_AMBULATORY_CARE_PROVIDER_SITE_OTHER): Payer: Medicare Other | Admitting: Internal Medicine

## 2021-02-13 VITALS — BP 128/82 | HR 103 | Temp 98.3°F | Ht 68.2 in | Wt 232.4 lb

## 2021-02-13 DIAGNOSIS — D571 Sickle-cell disease without crisis: Secondary | ICD-10-CM

## 2021-02-13 DIAGNOSIS — N182 Chronic kidney disease, stage 2 (mild): Secondary | ICD-10-CM

## 2021-02-13 DIAGNOSIS — Z0000 Encounter for general adult medical examination without abnormal findings: Secondary | ICD-10-CM | POA: Diagnosis not present

## 2021-02-13 DIAGNOSIS — Z1211 Encounter for screening for malignant neoplasm of colon: Secondary | ICD-10-CM

## 2021-02-13 DIAGNOSIS — E1122 Type 2 diabetes mellitus with diabetic chronic kidney disease: Secondary | ICD-10-CM | POA: Diagnosis not present

## 2021-02-13 DIAGNOSIS — I129 Hypertensive chronic kidney disease with stage 1 through stage 4 chronic kidney disease, or unspecified chronic kidney disease: Secondary | ICD-10-CM

## 2021-02-13 DIAGNOSIS — Z6835 Body mass index (BMI) 35.0-35.9, adult: Secondary | ICD-10-CM

## 2021-02-13 LAB — POCT URINALYSIS DIPSTICK
Bilirubin, UA: NEGATIVE
Blood, UA: NEGATIVE
Glucose, UA: NEGATIVE
Leukocytes, UA: NEGATIVE
Nitrite, UA: NEGATIVE
Protein, UA: POSITIVE — AB
Spec Grav, UA: 1.02 (ref 1.010–1.025)
Urobilinogen, UA: 0.2 E.U./dL
pH, UA: 6.5 (ref 5.0–8.0)

## 2021-02-13 LAB — POCT UA - MICROALBUMIN
Creatinine, POC: 300 mg/dL
Microalbumin Ur, POC: 150 mg/L

## 2021-02-13 NOTE — Patient Instructions (Signed)

## 2021-02-13 NOTE — Progress Notes (Signed)
I,[name],acting as a scribe for [name], MD.,have documented all relevant documentation on the behalf of [name], MD,as directed by  [name], MD while in the presence of [name], MD.  This visit occurred during the SARS-CoV-2 public health emergency.  Safety protocols were in place, including screening questions prior to the visit, additional usage of staff PPE, and extensive cleaning of exam room while observing appropriate contact time as indicated for disinfecting solutions.  Subjective:     Patient ID: Nicolas [name] , male    DOB: 02/23/1976 , 45 y.o.   MRN: [id number]   Chief Complaint  Patient presents with  . Annual Exam  . Diabetes  . Hypertension    HPI  He is here today for a full physical exam.  He has no specific concerns or complaints at this time.  He states he is exercising regularly.   Diabetes He presents for his follow-up diabetic visit. He has type 2 diabetes mellitus. There are no hypoglycemic associated symptoms. Pertinent negatives for diabetes include no blurred vision and no chest pain. There are no hypoglycemic complications. Diabetic complications include nephropathy. Risk factors for coronary artery disease include diabetes mellitus, dyslipidemia, hypertension, male sex, sedentary lifestyle and obesity. Current diabetic treatment includes oral agent (dual therapy). He is following a diabetic diet. He participates in exercise intermittently. His home blood glucose trend is fluctuating minimally. His breakfast blood glucose is taken between 8-9 am. His breakfast blood glucose range is generally 110-130 mg/dl. An ACE inhibitor/angiotensin II receptor blocker is being taken. He does not see a podiatrist.Eye exam is current.  Hypertension This is a chronic problem. The current episode started more than 1 year ago. The problem has been gradually improving since onset. The problem is controlled. Pertinent negatives include no blurred  vision, chest pain, palpitations or shortness of breath. Past treatments include ACE inhibitors, calcium channel blockers and beta blockers. The current treatment provides moderate improvement. Compliance problems include exercise.  Hypertensive end-organ damage includes kidney disease.     Past Medical History:  Diagnosis Date  . Arthritis    arthritis- back & L hip  . Depression    situational depression, pt. out of work   . Diabetes mellitus (HCC)   . HTN (hypertension) 05/10/2015  . Osteoarthritis of left hip 06/07/2012  . Sickle cell anemia (HCC)      Family History  Problem Relation Age of Onset  . Cancer - Other Father   . Liver cancer Father   . Diabetes Mother   . CAD Neg Hx      Current Outpatient Medications:  .  amLODipine (NORVASC) 10 MG tablet, Take 1 tablet (10 mg total) by mouth daily., Disp: 90 tablet, Rfl: 1 .  carvedilol (COREG) 6.25 MG tablet, TAKE 1 TABLET BY MOUTH TWICE A DAY WITH MEALS, Disp: 180 tablet, Rfl: 1 .  Cholecalciferol (VITAMIN D3) 2000 units capsule, Take 2,000 Units by mouth daily., Disp: , Rfl: 5 .  cyclobenzaprine (FLEXERIL) 10 MG tablet, Take 10 mg by mouth 3 (three) times daily as needed for muscle spasms., Disp: , Rfl:  .  folic acid (FOLVITE) 1 MG tablet, Take 1 mg by mouth daily., Disp: , Rfl: 11 .  glucose blood test strip, Use as instructed (Patient taking differently: 1 each by Other route every other day. Use as instructed), Disp: 100 each, Rfl: 12 .  Lancets (ACCU-CHEK MULTICLIX) lancets, Use as instructed (Patient taking differently: 1 each by Other   route every other day.), Disp: 100 each, Rfl: 12 .  lisinopril (ZESTRIL) 10 MG tablet, TAKE 2 TABLETS BY MOUTH EVERY DAY, Disp: 180 tablet, Rfl: 2 .  metFORMIN (GLUCOPHAGE) 500 MG tablet, TAKE 1 TABLET BY MOUTH TWICE A DAY, Disp: 180 tablet, Rfl: 2 .  Multiple Vitamin (ONE-A-DAY MENS PO), Take by mouth. 1 daily, Disp: , Rfl:  .  OXBRYTA 500 MG TABS tablet, Take by mouth., Disp: , Rfl:  .   oxyCODONE-acetaminophen (PERCOCET) 7.5-325 MG tablet, Take 1 tablet by mouth every 4 (four) hours as needed for severe pain., Disp: , Rfl:  .  OZEMPIC, 0.25 OR 0.5 MG/DOSE, 2 MG/1.5ML SOPN, INJECT 0.5 MG INTO THE SKIN ONCE A WEEK., Disp: 9 mL, Rfl: 3 .  pravastatin (PRAVACHOL) 20 MG tablet, TAKE 1 TABLET BY MOUTH EVERY DAY IN THE EVENING, Disp: 90 tablet, Rfl: 0 .  triamterene-hydrochlorothiazide (MAXZIDE-25) 37.5-25 MG tablet, TAKE 1 TABLET BY MOUTH EVERY DAY, Disp: 90 tablet, Rfl: 1   No Known Allergies   Men's preventive visit. Patient Health Questionnaire (PHQ-2) is  Flowsheet Row Office Visit from 02/13/2021 in [facility]  PHQ-2 Total Score 0    . Patient is on a healthy diet. Marital status: Married. Relevant history for alcohol use is:  Social History   Substance and Sexual Activity  Alcohol Use Not Currently   Comment: ocassional   . Relevant history for tobacco use is:  Social History   Tobacco Use  Smoking Status Never Smoker  Smokeless Tobacco Never Used  .   Review of Systems  Constitutional: Negative.   HENT: Negative.   Eyes: Negative.  Negative for blurred vision.  Respiratory: Negative.  Negative for shortness of breath.   Cardiovascular: Negative.  Negative for chest pain and palpitations.  Gastrointestinal: Negative.   Endocrine: Negative.   Genitourinary: Negative.   Musculoskeletal: Negative.   Skin: Negative.   Allergic/Immunologic: Negative.   Neurological: Negative.   Hematological: Negative.   Psychiatric/Behavioral: Negative.      Today's Vitals   02/13/21 1011  BP: 128/82  Pulse: (!) 103  Temp: 98.3 F (36.8 C)  TempSrc: Oral  Weight: 232 lb 6.4 oz (105.4 kg)  Height: 5' 8.2" (1.732 m)   Body mass index is 35.13 kg/m.  Wt Readings from Last 3 Encounters:  02/13/21 232 lb 6.4 oz (105.4 kg)  11/13/20 230 lb 12.8 oz (104.7 kg)  08/13/20 232 lb 3.2 oz (105.3 kg)   BP Readings from Last 3 Encounters:  02/13/21  128/82  11/13/20 120/74  08/13/20 132/78   Objective:  Physical Exam Vitals and nursing note reviewed.  Constitutional:      Appearance: Normal appearance.  HENT:     Head: Normocephalic and atraumatic.     Right Ear: Tympanic membrane, ear canal and external ear normal.     Left Ear: Tympanic membrane, ear canal and external ear normal.     Nose:     Comments: Masked     Mouth/Throat:     Comments: Masked  Eyes:     Extraocular Movements: Extraocular movements intact.     Conjunctiva/sclera: Conjunctivae normal.     Pupils: Pupils are equal, round, and reactive to light.  Cardiovascular:     Rate and Rhythm: Normal rate and regular rhythm.     Pulses:          Dorsalis pedis pulses are 1+ on the right side and 1+ on the left side.     Heart   sounds: Normal heart sounds.  Pulmonary:     Effort: Pulmonary effort is normal.     Breath sounds: Normal breath sounds.  Chest:  Breasts:     Right: Normal. No swelling, bleeding, inverted nipple, mass or nipple discharge.     Left: Normal. No swelling, bleeding, inverted nipple, mass or nipple discharge.    Abdominal:     General: Bowel sounds are normal. There is distension.     Palpations: Abdomen is soft.     Hernia: A hernia is present.     Comments: Obese, umbilical hernia  Genitourinary:    Comments: Deferred, as per patient Musculoskeletal:        General: Normal range of motion.     Cervical back: Normal range of motion and neck supple.  Feet:     Right foot:     Protective Sensation: 5 sites tested. 5 sites sensed.     Skin integrity: Callus and dry skin present.     Toenail Condition: Right toenails are abnormally thick.     Left foot:     Protective Sensation: 5 sites tested. 5 sites sensed.     Skin integrity: Callus and dry skin present.     Toenail Condition: Left toenails are abnormally thick.  Skin:    General: Skin is warm.  Neurological:     General: No focal deficit present.     Mental Status: He is  alert.  Psychiatric:        Mood and Affect: Mood normal.        Behavior: Behavior normal.         Assessment And Plan:    1. Routine general medical examination at health care facility Comments: A full exam was performed. DRE deferred, as per patient.  PATIENT IS ADVISED TO GET 30-45 MINUTES REGULAR EXERCISE NO LESS THAN FOUR TO FIVE DAYS PER WEEK - BOTH WEIGHTBEARING EXERCISES AND AEROBIC ARE RECOMMENDED.  PATIENT IS ADVISED TO FOLLOW A HEALTHY DIET WITH AT LEAST SIX FRUITS/VEGGIES PER DAY, DECREASE INTAKE OF RED MEAT, AND TO INCREASE FISH INTAKE TO TWO DAYS PER WEEK.  MEATS/FISH SHOULD NOT BE FRIED, BAKED OR BROILED IS PREFERABLE.  IT IS ALSO IMPORTANT TO CUT BACK ON YOUR SUGAR INTAKE. PLEASE AVOID ANYTHING WITH ADDED SUGAR, CORN SYRUP OR OTHER SWEETENERS. IF YOU MUST USE A SWEETENER, YOU CAN TRY STEVIA. IT IS ALSO IMPORTANT TO AVOID ARTIFICIALLY SWEETENERS AND DIET BEVERAGES. LASTLY, I SUGGEST WEARING SPF 50 SUNSCREEN ON EXPOSED PARTS AND ESPECIALLY WHEN IN THE DIRECT SUNLIGHT FOR AN EXTENDED PERIOD OF TIME.  PLEASE AVOID FAST FOOD RESTAURANTS AND INCREASE YOUR WATER INTAKE.  - PSA  2. Type 2 diabetes mellitus with stage 2 chronic kidney disease, without long-term current use of insulin (HCC) Comments: Diabetic foot exam was performed. He is currently only taking metformin. I would like to consider use of Farxiga. I will check fructosamine due to SCD. I DISCUSSED WITH THE PATIENT AT LENGTH REGARDING THE GOALS OF GLYCEMIC CONTROL AND POSSIBLE LONG-TERM COMPLICATIONS.  I  ALSO STRESSED THE IMPORTANCE OF COMPLIANCE WITH HOME GLUCOSE MONITORING, DIETARY RESTRICTIONS INCLUDING AVOIDANCE OF SUGARY DRINKS/PROCESSED FOODS,  ALONG WITH REGULAR EXERCISE.  I  ALSO STRESSED THE IMPORTANCE OF ANNUAL EYE EXAMS, SELF FOOT CARE AND COMPLIANCE WITH OFFICE VISITS. - POCT Urinalysis Dipstick (81002) - POCT UA - Microalbumin - Hemoglobin A1c - CMP14+EGFR - Lipid panel - Fructosamine  3. Hypertensive  nephropathy Comments: Chronic, well controlled. He is encouraged to follow low sodium diet.    EKG performed, NSR w/o acute changes. I will check renal function today. He will rto in 4 months for re-evaluation.  - EKG 12-Lead - CMP14+EGFR  4. Sickle cell disease without crisis (HCC) Comments: Chronic. He is also followed by Hematology at [facility].   5. Class 2 severe obesity due to excess calories with serious comorbidity and body mass index (BMI) of 35.0 to 35.9 in adult (HCC) Comments: He is encouraged to initially strive for BMI less than 30 to decrease cardiac risk. He is advised to exercise no less than 150 minutes per week.   6. Screen for colon cancer Comments: He agrees to GI referral for CRC screening.  - Ambulatory referral to Gastroenterology  Patient was given opportunity to ask questions. Patient verbalized understanding of the plan and was able to repeat key elements of the plan. All questions were answered to their satisfaction.   I, [name], MD, have reviewed all documentation for this visit. The documentation on 02/13/21 for the exam, diagnosis, procedures, and orders are all accurate and complete.  THE PATIENT IS ENCOURAGED TO PRACTICE SOCIAL DISTANCING DUE TO THE COVID-19 PANDEMIC.   

## 2021-02-14 LAB — HEMOGLOBIN A1C
Est. average glucose Bld gHb Est-mCnc: 82 mg/dL
Hgb A1c MFr Bld: 4.5 % — ABNORMAL LOW (ref 4.8–5.6)

## 2021-02-14 LAB — LIPID PANEL
Chol/HDL Ratio: 3.1 ratio (ref 0.0–5.0)
Cholesterol, Total: 129 mg/dL (ref 100–199)
HDL: 41 mg/dL (ref >39)
LDL Chol Calc (NIH): 61 mg/dL (ref 0–99)
Triglycerides: 159 mg/dL — ABNORMAL HIGH (ref 0–149)
VLDL Cholesterol Cal: 27 mg/dL (ref 5–40)

## 2021-02-14 LAB — PSA: Prostate Specific Ag, Serum: 0.9 ng/mL (ref 0.0–4.0)

## 2021-02-14 LAB — FRUCTOSAMINE: Fructosamine: 248 umol/L (ref 0–285)

## 2021-02-19 LAB — CMP14+EGFR
ALT: 42 IU/L (ref 0–44)
AST: 43 IU/L — ABNORMAL HIGH (ref 0–40)
Albumin/Globulin Ratio: 2 (ref 1.2–2.2)
Albumin: 4.5 g/dL (ref 4.0–5.0)
Alkaline Phosphatase: 58 IU/L (ref 44–121)
BUN/Creatinine Ratio: 10 (ref 9–20)
BUN: 13 mg/dL (ref 6–24)
Bilirubin Total: 0.3 mg/dL (ref 0.0–1.2)
CO2: 23 mmol/L (ref 20–29)
Calcium: 9.9 mg/dL (ref 8.7–10.2)
Chloride: 94 mmol/L — ABNORMAL LOW (ref 96–106)
Creatinine, Ser: 1.26 mg/dL (ref 0.76–1.27)
Globulin, Total: 2.3 g/dL (ref 1.5–4.5)
Glucose: 128 mg/dL — ABNORMAL HIGH (ref 65–99)
Potassium: 3.6 mmol/L (ref 3.5–5.2)
Sodium: 138 mmol/L (ref 134–144)
Total Protein: 6.8 g/dL (ref 6.0–8.5)
eGFR: 71 mL/min/{1.73_m2} (ref >59)

## 2021-02-19 LAB — SPECIMEN STATUS REPORT

## 2021-02-20 ENCOUNTER — Other Ambulatory Visit: Payer: Self-pay

## 2021-02-20 ENCOUNTER — Ambulatory Visit (INDEPENDENT_AMBULATORY_CARE_PROVIDER_SITE_OTHER): Payer: Medicare Other

## 2021-02-20 VITALS — BP 140/80 | HR 93 | Temp 98.2°F | Ht 68.0 in | Wt 236.8 lb

## 2021-02-20 DIAGNOSIS — Z1211 Encounter for screening for malignant neoplasm of colon: Secondary | ICD-10-CM

## 2021-02-20 DIAGNOSIS — Z0000 Encounter for general adult medical examination without abnormal findings: Secondary | ICD-10-CM

## 2021-02-20 NOTE — Patient Instructions (Signed)
Nicolas [name] , Thank you for taking time to come for your Medicare Wellness Visit. I appreciate your ongoing commitment to your health goals. Please review the following plan we discussed and let me know if I can assist you in the future.   Screening recommendations/referrals: Colonoscopy: ordered today Recommended yearly ophthalmology/optometry visit for glaucoma screening and checkup Recommended yearly dental visit for hygiene and checkup  Vaccinations: Influenza vaccine: decline Pneumococcal vaccine: completed 02/20/2020 Tdap vaccine: completed 06/08/2014, due 06/08/2024 Shingles vaccine: n/a Covid-19: 10/16/2020, 01/30/2020, 01/07/2020  Advanced directives: Advance directive discussed with you today. Even though you declined this today please call our office should you change your mind and we can give you the proper paperwork for you to fill out.  Conditions/risks identified: none  Next appointment: Follow up in one year for your annual wellness visit   Preventive Care 40-64 Years, Male Preventive care refers to lifestyle choices and visits with your health care provider that can promote health and wellness. What does preventive care include?  A yearly physical exam. This is also called an annual well check.  Dental exams once or twice a year.  Routine eye exams. Ask your health care provider how often you should have your eyes checked.  Personal lifestyle choices, including:  Daily care of your teeth and gums.  Regular physical activity.  Eating a healthy diet.  Avoiding tobacco and drug use.  Limiting alcohol use.  Practicing safe sex.  Taking low-dose aspirin every day starting at age 25. What happens during an annual well check? The services and screenings done by your health care provider during your annual well check will depend on your age, overall health, lifestyle risk factors, and family history of disease. Counseling  Your health care provider may ask you  questions about your:  Alcohol use.  Tobacco use.  Drug use.  Emotional well-being.  Home and relationship well-being.  Sexual activity.  Eating habits.  Work and work Astronomer. Screening  You may have the following tests or measurements:  Height, weight, and BMI.  Blood pressure.  Lipid and cholesterol levels. These may be checked every 5 years, or more frequently if you are over 54 years old.  Skin check.  Lung cancer screening. You may have this screening every year starting at age 49 if you have a 30-pack-year history of smoking and currently smoke or have quit within the past 15 years.  Fecal occult blood test (FOBT) of the stool. You may have this test every year starting at age 53.  Flexible sigmoidoscopy or colonoscopy. You may have a sigmoidoscopy every 5 years or a colonoscopy every 10 years starting at age 26.  Prostate cancer screening. Recommendations will vary depending on your family history and other risks.  Hepatitis C blood test.  Hepatitis B blood test.  Sexually transmitted disease (STD) testing.  Diabetes screening. This is done by checking your blood sugar (glucose) after you have not eaten for a while (fasting). You may have this done every 1-3 years. Discuss your test results, treatment options, and if necessary, the need for more tests with your health care provider. Vaccines  Your health care provider may recommend certain vaccines, such as:  Influenza vaccine. This is recommended every year.  Tetanus, diphtheria, and acellular pertussis (Tdap, Td) vaccine. You may need a Td booster every 10 years.  Zoster vaccine. You may need this after age 68.  Pneumococcal 13-valent conjugate (PCV13) vaccine. You may need this if you have certain conditions and have  not been vaccinated.  Pneumococcal polysaccharide (PPSV23) vaccine. You may need one or two doses if you smoke cigarettes or if you have certain conditions. Talk to your health care  provider about which screenings and vaccines you need and how often you need them. This information is not intended to replace advice given to you by your health care provider. Make sure you discuss any questions you have with your health care provider. Document Released: 10/12/2015 Document Revised: 06/04/2016 Document Reviewed: 07/17/2015 Elsevier Interactive Patient Education  2017 [employer].  Fall Prevention in the Home Falls can cause injuries. They can happen to people of all ages. There are many things you can do to make your home safe and to help prevent falls. What can I do on the outside of my home?  Regularly fix the edges of walkways and driveways and fix any cracks.  Remove anything that might make you trip as you walk through a door, such as a raised step or threshold.  Trim any bushes or trees on the path to your home.  Use bright outdoor lighting.  Clear any walking paths of anything that might make someone trip, such as rocks or tools.  Regularly check to see if handrails are loose or broken. Make sure that both sides of any steps have handrails.  Any raised decks and porches should have guardrails on the edges.  Have any leaves, snow, or ice cleared regularly.  Use sand or salt on walking paths during winter.  Clean up any spills in your garage right away. This includes oil or grease spills. What can I do in the bathroom?  Use night lights.  Install grab bars by the toilet and in the tub and shower. Do not use towel bars as grab bars.  Use non-skid mats or decals in the tub or shower.  If you need to sit down in the shower, use a plastic, non-slip stool.  Keep the floor dry. Clean up any water that spills on the floor as soon as it happens.  Remove soap buildup in the tub or shower regularly.  Attach bath mats securely with double-sided non-slip rug tape.  Do not have throw rugs and other things on the floor that can make you trip. What can I do in  the bedroom?  Use night lights.  Make sure that you have a light by your bed that is easy to reach.  Do not use any sheets or blankets that are too big for your bed. They should not hang down onto the floor.  Have a firm chair that has side arms. You can use this for support while you get dressed.  Do not have throw rugs and other things on the floor that can make you trip. What can I do in the kitchen?  Clean up any spills right away.  Avoid walking on wet floors.  Keep items that you use a lot in easy-to-reach places.  If you need to reach something above you, use a strong step stool that has a grab bar.  Keep electrical cords out of the way.  Do not use floor polish or wax that makes floors slippery. If you must use wax, use non-skid floor wax.  Do not have throw rugs and other things on the floor that can make you trip. What can I do with my stairs?  Do not leave any items on the stairs.  Make sure that there are handrails on both sides of the stairs and use  them. Fix handrails that are broken or loose. Make sure that handrails are as long as the stairways.  Check any carpeting to make sure that it is firmly attached to the stairs. Fix any carpet that is loose or worn.  Avoid having throw rugs at the top or bottom of the stairs. If you do have throw rugs, attach them to the floor with carpet tape.  Make sure that you have a light switch at the top of the stairs and the bottom of the stairs. If you do not have them, ask someone to add them for you. What else can I do to help prevent falls?  Wear shoes that:  Do not have high heels.  Have rubber bottoms.  Are comfortable and fit you well.  Are closed at the toe. Do not wear sandals.  If you use a stepladder:  Make sure that it is fully opened. Do not climb a closed stepladder.  Make sure that both sides of the stepladder are locked into place.  Ask someone to hold it for you, if possible.  Clearly mark and  make sure that you can see:  Any grab bars or handrails.  First and last steps.  Where the edge of each step is.  Use tools that help you move around (mobility aids) if they are needed. These include:  Canes.  Walkers.  Scooters.  Crutches.  Turn on the lights when you go into a dark area. Replace any light bulbs as soon as they burn out.  Set up your furniture so you have a clear path. Avoid moving your furniture around.  If any of your floors are uneven, fix them.  If there are any pets around you, be aware of where they are.  Review your medicines with your doctor. Some medicines can make you feel dizzy. This can increase your chance of falling. Ask your doctor what other things that you can do to help prevent falls. This information is not intended to replace advice given to you by your health care provider. Make sure you discuss any questions you have with your health care provider. Document Released: 07/12/2009 Document Revised: 02/21/2016 Document Reviewed: 10/20/2014 Elsevier Interactive Patient Education  2017 [employer].

## 2021-02-20 NOTE — Progress Notes (Signed)
This visit occurred during the SARS-CoV-2 public health emergency.  Safety protocols were in place, including screening questions prior to the visit, additional usage of staff PPE, and extensive cleaning of exam room while observing appropriate contact time as indicated for disinfecting solutions.  Subjective:   Nicolas [name] is a 45 y.o. male who presents for Medicare Annual/Subsequent preventive examination.  Review of Systems     Cardiac Risk Factors include: diabetes mellitus;hypertension;male gender     Objective:    Today's Vitals   02/20/21 1448 02/20/21 1453  BP: 140/78   Pulse: 93   Temp: 98.2 F (36.8 C)   TempSrc: Oral   SpO2: 97%   Weight: 236 lb 12.8 oz (107.4 kg)   Height: 5\' 8"  (1.727 m)   PainSc:  7    Body mass index is 36.01 kg/m.  Advanced Directives 02/20/2021 02/08/2020 02/01/2019 06/25/2017 09/23/2016 09/22/2016 02/12/2016  Does Patient Have a Medical Advance Directive? No No No No No No No  Would patient like information on creating a medical advance directive? - No - Patient declined - - No - Patient declined - No - patient declined information  Pre-existing out of facility DNR order (yellow form or pink MOST form) - - - - - - -    Current Medications (verified) Outpatient Encounter Medications as of 02/20/2021  Medication Sig  . amLODipine (NORVASC) 10 MG tablet Take 1 tablet (10 mg total) by mouth daily.  . carvedilol (COREG) 6.25 MG tablet TAKE 1 TABLET BY MOUTH TWICE A DAY WITH MEALS  . Cholecalciferol (VITAMIN D3) 2000 units capsule Take 2,000 Units by mouth daily.  . cyclobenzaprine (FLEXERIL) 10 MG tablet Take 10 mg by mouth 3 (three) times daily as needed for muscle spasms.  . folic acid (FOLVITE) 1 MG tablet Take 1 mg by mouth daily.  Marland Kitchen. glucose blood test strip Use as instructed (Patient taking differently: 1 each by Other route every other day. Use as instructed)  . Lancets (ACCU-CHEK MULTICLIX) lancets Use as instructed (Patient taking  differently: 1 each by Other route every other day.)  . lisinopril (ZESTRIL) 10 MG tablet TAKE 2 TABLETS BY MOUTH EVERY DAY  . metFORMIN (GLUCOPHAGE) 500 MG tablet TAKE 1 TABLET BY MOUTH TWICE A DAY  . Multiple Vitamin (ONE-A-DAY MENS PO) Take by mouth. 1 daily  . OXBRYTA 500 MG TABS tablet Take by mouth.  . oxyCODONE-acetaminophen (PERCOCET) 7.5-325 MG tablet Take 1 tablet by mouth every 4 (four) hours as needed for severe pain.  Marland Kitchen. OZEMPIC, 0.25 OR 0.5 MG/DOSE, 2 MG/1.5ML SOPN INJECT 0.5 MG INTO THE SKIN ONCE A WEEK.  . pravastatin (PRAVACHOL) 20 MG tablet TAKE 1 TABLET BY MOUTH EVERY DAY IN THE EVENING  . triamterene-hydrochlorothiazide (MAXZIDE-25) 37.5-25 MG tablet TAKE 1 TABLET BY MOUTH EVERY DAY   No facility-administered encounter medications on file as of 02/20/2021.    Allergies (verified) Patient has no known allergies.   History: Past Medical History:  Diagnosis Date  . Arthritis    arthritis- back & L hip  . Depression    situational depression, pt. out of work   . Diabetes mellitus (HCC)   . HTN (hypertension) 05/10/2015  . Osteoarthritis of left hip 06/07/2012  . Sickle cell anemia (HCC)    Past Surgical History:  Procedure Laterality Date  . IR GENERIC HISTORICAL  09/26/2016   IR FLUORO GUIDE CV LINE RIGHT 09/26/2016 Irish LackGlenn Yamagata, MD [facility]  . IR GENERIC HISTORICAL  09/26/2016   IR [place]  GUIDE VASC ACCESS RIGHT 09/26/2016 Irish Lack, MD [facility]  . JOINT REPLACEMENT     R hip  . TOTAL HIP ARTHROPLASTY  06/07/2012   Procedure: TOTAL HIP ARTHROPLASTY;  Surgeon: Eulas Post, MD;  Location: [facility] OR;  Service: Orthopedics;  Laterality: Left;   Family History  Problem Relation Age of Onset  . Cancer - Other Father   . Liver cancer Father   . Diabetes Mother   . CAD Neg Hx    Social History   Socioeconomic History  . Marital status: Married    Spouse name: Not on file  . Number of children: 2  . Years of education: Not on file  . Highest  education level: Not on file  Occupational History  . Occupation: Disabled  Tobacco Use  . Smoking status: Never Smoker  . Smokeless tobacco: Never Used  Vaping Use  . Vaping Use: Never used  Substance and Sexual Activity  . Alcohol use: Not Currently    Comment: ocassional   . Drug use: Yes    Types: Oxycodone  . Sexual activity: Yes  Other Topics Concern  . Not on file  Social History Narrative  . Not on file   Social Determinants of Health   Financial Resource Strain: Low Risk   . Difficulty of Paying Living Expenses: Not hard at all  Food Insecurity: No Food Insecurity  . Worried About Programme researcher, broadcasting/film/video in the Last Year: Never true  . Ran Out of Food in the Last Year: Never true  Transportation Needs: No Transportation Needs  . Lack of Transportation (Medical): No  . Lack of Transportation (Non-Medical): No  Physical Activity: Insufficiently Active  . Days of Exercise per Week: 3 days  . Minutes of Exercise per Session: 30 min  Stress: No Stress Concern Present  . Feeling of Stress : Not at all  Social Connections: Not on file    Tobacco Counseling Counseling given: Not Answered   Clinical Intake:  Pre-visit preparation completed: Yes  Pain : 0-10 Pain Score: 7  Pain Type: Chronic pain Pain Location: Back Pain Orientation: Mid,Lower Pain Descriptors / Indicators: Jabbing Pain Onset: More than a month ago Pain Frequency: Intermittent Pain Relieving Factors: massage, heat, oxy  Pain Relieving Factors: massage, heat, oxy  Nutritional Status: BMI > 30  Obese Nutritional Risks: None Diabetes: Yes  How often do you need to have someone help you when you read instructions, pamphlets, or other written materials from your doctor or pharmacy?: 1 - Never What is the last grade level you completed in school?: associates degree  Diabetic? Yes Nutrition Risk Assessment:  Has the patient had any N/V/D within the last 2 months?  No  Does the patient have any  non-healing wounds?  No  Has the patient had any unintentional weight loss or weight gain?  No   Diabetes:  Is the patient diabetic?  Yes  If diabetic, was a CBG obtained today?  No  Did the patient bring in their glucometer from home?  No  How often do you monitor your CBG's? Once week.   Financial Strains and Diabetes Management:  Are you having any financial strains with the device, your supplies or your medication? No .  Does the patient want to be seen by Chronic Care Management for management of their diabetes?  No  Would the patient like to be referred to a Nutritionist or for Diabetic Management?  No   Diabetic Exams:  Diabetic Eye  Exam: waiting for results Diabetic Foot Exam: Completed 08/13/2020   Interpreter Needed?: No  Information entered by :: NAllen LPN   Activities of Daily Living In your present state of health, do you have any difficulty performing the following activities: 02/20/2021  Hearing? N  Vision? N  Difficulty concentrating or making decisions? N  Walking or climbing stairs? Y  Dressing or bathing? N  Doing errands, shopping? N  Preparing Food and eating ? N  Using the Toilet? N  In the past six months, have you accidently leaked urine? N  Do you have problems with loss of bowel control? N  Managing your Medications? N  Managing your Finances? N  Housekeeping or managing your Housekeeping? N  Some recent data might be hidden    Patient Care Team: Dorothyann Peng, MD as PCP - General (Internal Medicine)  Indicate any recent Medical Services you may have received from other than [facility] providers in the past year (date may be approximate).     Assessment:   This is a routine wellness examination for [facility].  Hearing/Vision screen No exam data present  Dietary issues and exercise activities discussed: Current Exercise Habits: Home exercise routine, Type of exercise: walking, Time (Minutes): 45, Frequency (Times/Week): 3, Weekly Exercise  (Minutes/Week): 135  Goals Addressed            This Visit's Progress   . Patient Stated       02/20/2021, stay healthy      Depression Screen PHQ 2/9 Scores 02/20/2021 02/13/2021 02/08/2020 02/08/2020 02/07/2019 02/01/2019 10/06/2018  PHQ - 2 Score 0 0 0 0 0 0 0  PHQ- 9 Score - - 0 - - 0 -    Fall Risk Fall Risk  02/20/2021 02/08/2020 05/25/2019 02/07/2019 02/01/2019  Falls in the past year? 0 0 0 0 0  Risk for fall due to : Medication side effect Medication side effect - - Medication side effect  Follow up Falls evaluation completed;Education provided;Falls prevention discussed Falls evaluation completed;Education provided;Falls prevention discussed - - Falls prevention discussed    FALL RISK PREVENTION PERTAINING TO THE HOME:  Any stairs in or around the home? Yes  If so, are there any without handrails? No  Home free of loose throw rugs in walkways, pet beds, electrical cords, etc? Yes  Adequate lighting in your home to reduce risk of falls? Yes   ASSISTIVE DEVICES UTILIZED TO PREVENT FALLS:  Life alert? No  Use of a cane, walker or w/c? No  Grab bars in the bathroom? Yes  Shower chair or bench in shower? Yes  Elevated toilet seat or a handicapped toilet? Yes   TIMED UP AND GO:  Was the test performed? No .    Gait steady and fast without use of assistive device  Cognitive Function:     6CIT Screen 02/20/2021 02/08/2020 02/01/2019  What Year? 0 points 0 points 0 points  What month? 0 points 0 points 0 points  What time? 0 points 0 points 0 points  Count back from 20 0 points 0 points 0 points  Months in reverse 0 points 0 points 0 points  Repeat phrase 4 points 2 points 0 points  Total Score 4 2 0    Immunizations Immunization History  Administered Date(s) Administered  . Hepatitis B, adult 08/20/2015, 09/17/2015, 02/18/2016  . HiB (PRP-T) 02/08/2015  . Meningococcal B Recombinant 08/06/2020, 09/12/2020  . Meningococcal Conjugate 11/09/2014, 02/20/2020, 02/20/2020   . PFIZER(Purple Top)SARS-COV-2 Vaccination 01/07/2020, 01/30/2020, 10/16/2020  .  Pneumococcal Conjugate-13 11/09/2014  . Pneumococcal Polysaccharide-23 02/08/2015, 02/20/2020    TDAP status: Up to date  Flu Vaccine status: Declined, Education has been provided regarding the importance of this vaccine but patient still declined. Advised may receive this vaccine at local pharmacy or Health Dept. Aware to provide a copy of the vaccination record if obtained from local pharmacy or Health Dept. Verbalized acceptance and understanding.  Pneumococcal vaccine status: Up to date  Covid-19 vaccine status: Completed vaccines  Qualifies for Shingles Vaccine? No   Zostavax completed n/a  Shingrix Completed?: n/a  Screening Tests Health Maintenance  Topic Date Due  . COLONOSCOPY (Pts 45-70yrs Insurance coverage will need to be confirmed)  Never done  . OPHTHALMOLOGY EXAM  01/18/2021  . INFLUENZA VACCINE  04/29/2021  . FOOT EXAM  08/13/2021  . HEMOGLOBIN A1C  08/16/2021  . TETANUS/TDAP  06/08/2024  . PNEUMOCOCCAL POLYSACCHARIDE VACCINE AGE 54-64 HIGH RISK  Completed  . COVID-19 Vaccine  Completed  . Hepatitis C Screening  Completed  . HIV Screening  Completed  . HPV VACCINES  Aged Out    Health Maintenance  Health Maintenance Due  Topic Date Due  . COLONOSCOPY (Pts 45-31yrs Insurance coverage will need to be confirmed)  Never done  . OPHTHALMOLOGY EXAM  01/18/2021    Colorectal cancer screening: Referral to GI placed today. Pt aware the office will call re: appt.  Lung Cancer Screening: (Low Dose CT Chest recommended if Age 47-80 years, 30 pack-year currently smoking OR have quit w/in 15years.) does not qualify.   Lung Cancer Screening Referral: no  Additional Screening:  Hepatitis C Screening: does qualify; Completed 02/06/2021  Vision Screening: Recommended annual ophthalmology exams for early detection of glaucoma and other disorders of the eye. Is the patient up to date with  their annual eye exam?  Yes  Who is the provider or what is the name of the office in which the patient attends annual eye exams? Dr. Karleen Hampshire If pt is not established with a provider, would they like to be referred to a provider to establish care? No .   Dental Screening: Recommended annual dental exams for proper oral hygiene  Community Resource Referral / Chronic Care Management: CRR required this visit?  No   CCM required this visit?  No      Plan:     I have personally reviewed and noted the following in the patient's chart:   . Medical and social history . Use of alcohol, tobacco or illicit drugs  . Current medications and supplements including opioid prescriptions. Patient is currently taking opioid prescriptions. Information provided to patient regarding non-opioid alternatives. Patient advised to discuss non-opioid treatment plan with their provider. . Functional ability and status . Nutritional status . Physical activity . Advanced directives . List of other physicians . Hospitalizations, surgeries, and ER visits in previous 12 months . Vitals . Screenings to include cognitive, depression, and falls . Referrals and appointments  In addition, I have reviewed and discussed with patient certain preventive protocols, quality metrics, and best practice recommendations. A written personalized care plan for preventive services as well as general preventive health recommendations were provided to patient.     Barb Merino, LPN   03/25/349   Nurse Notes:

## 2021-02-27 ENCOUNTER — Other Ambulatory Visit: Payer: Self-pay | Admitting: Internal Medicine

## 2021-02-27 ENCOUNTER — Other Ambulatory Visit: Payer: Self-pay | Admitting: Cardiology

## 2021-02-28 DIAGNOSIS — L730 Acne keloid: Secondary | ICD-10-CM | POA: Diagnosis not present

## 2021-05-26 ENCOUNTER — Other Ambulatory Visit: Payer: Self-pay | Admitting: Internal Medicine

## 2021-05-30 ENCOUNTER — Other Ambulatory Visit: Payer: Self-pay | Admitting: Cardiology

## 2021-06-15 ENCOUNTER — Other Ambulatory Visit: Payer: Self-pay | Admitting: Cardiology

## 2021-06-19 ENCOUNTER — Encounter: Payer: Self-pay | Admitting: Internal Medicine

## 2021-06-19 ENCOUNTER — Ambulatory Visit (INDEPENDENT_AMBULATORY_CARE_PROVIDER_SITE_OTHER): Payer: Medicare Other | Admitting: Internal Medicine

## 2021-06-19 ENCOUNTER — Other Ambulatory Visit: Payer: Self-pay

## 2021-06-19 VITALS — BP 136/78 | HR 91 | Temp 99.2°F | Ht 68.0 in | Wt 237.0 lb

## 2021-06-19 DIAGNOSIS — N182 Chronic kidney disease, stage 2 (mild): Secondary | ICD-10-CM | POA: Diagnosis not present

## 2021-06-19 DIAGNOSIS — E1122 Type 2 diabetes mellitus with diabetic chronic kidney disease: Secondary | ICD-10-CM

## 2021-06-19 DIAGNOSIS — L700 Acne vulgaris: Secondary | ICD-10-CM

## 2021-06-19 DIAGNOSIS — Z6836 Body mass index (BMI) 36.0-36.9, adult: Secondary | ICD-10-CM

## 2021-06-19 DIAGNOSIS — M7021 Olecranon bursitis, right elbow: Secondary | ICD-10-CM

## 2021-06-19 DIAGNOSIS — Z2821 Immunization not carried out because of patient refusal: Secondary | ICD-10-CM | POA: Diagnosis not present

## 2021-06-19 DIAGNOSIS — D571 Sickle-cell disease without crisis: Secondary | ICD-10-CM

## 2021-06-19 DIAGNOSIS — E66812 Obesity, class 2: Secondary | ICD-10-CM

## 2021-06-19 DIAGNOSIS — I129 Hypertensive chronic kidney disease with stage 1 through stage 4 chronic kidney disease, or unspecified chronic kidney disease: Secondary | ICD-10-CM

## 2021-06-19 DIAGNOSIS — E6609 Other obesity due to excess calories: Secondary | ICD-10-CM

## 2021-06-19 MED ORDER — DICLOFENAC SODIUM 1 % EX GEL: 2.0000 g | Freq: Three times a day (TID) | CUTANEOUS | 1 refills | Status: DC | PRN

## 2021-06-19 NOTE — Patient Instructions (Signed)
Diabetes Mellitus and Nutrition, Adult When you have diabetes, or diabetes mellitus, it is very important to have healthy eating habits because your blood sugar (glucose) levels are greatly affected by what you eat and drink. Eating healthy foods in the right amounts, at about the same times every day, can help you:  Control your blood glucose.  Lower your risk of heart disease.  Improve your blood pressure.  Reach or maintain a healthy weight. What can affect my meal plan? Every person with diabetes is different, and each person has different needs for a meal plan. Your health care provider may recommend that you work with a dietitian to make a meal plan that is best for you. Your meal plan may vary depending on factors such as:  The calories you need.  The medicines you take.  Your weight.  Your blood glucose, blood pressure, and cholesterol levels.  Your activity level.  Other health conditions you have, such as heart or kidney disease. How do carbohydrates affect me? Carbohydrates, also called carbs, affect your blood glucose level more than any other type of food. Eating carbs naturally raises the amount of glucose in your blood. Carb counting is a method for keeping track of how many carbs you eat. Counting carbs is important to keep your blood glucose at a healthy level, especially if you use insulin or take certain oral diabetes medicines. It is important to know how many carbs you can safely have in each meal. This is different for every person. Your dietitian can help you calculate how many carbs you should have at each meal and for each snack. How does alcohol affect me? Alcohol can cause a sudden decrease in blood glucose (hypoglycemia), especially if you use insulin or take certain oral diabetes medicines. Hypoglycemia can be a life-threatening condition. Symptoms of hypoglycemia, such as sleepiness, dizziness, and confusion, are similar to symptoms of having too much  alcohol.  Do not drink alcohol if: ? Your health care provider tells you not to drink. ? You are pregnant, may be pregnant, or are planning to become pregnant.  If you drink alcohol: ? Do not drink on an empty stomach. ? Limit how much you use to:  0-1 drink a day for women.  0-2 drinks a day for men. ? Be aware of how much alcohol is in your drink. In the [place] one drink equals one 12 oz bottle of beer (355 mL), one 5 oz glass of wine (148 mL), or one 1 oz glass of hard liquor (44 mL). ? Keep yourself hydrated with water, diet soda, or unsweetened iced tea.  Keep in mind that regular soda, juice, and other mixers may contain a lot of sugar and must be counted as carbs. What are tips for following this plan? Reading food labels  Start by checking the serving size on the "Nutrition Facts" label of packaged foods and drinks. The amount of calories, carbs, fats, and other nutrients listed on the label is based on one serving of the item. Many items contain more than one serving per package.  Check the total grams (g) of carbs in one serving. You can calculate the number of servings of carbs in one serving by dividing the total carbs by 15. For example, if a food has 30 g of total carbs per serving, it would be equal to 2 servings of carbs.  Check the number of grams (g) of saturated fats and trans fats in one serving. Choose foods that have   a low amount or none of these fats.  Check the number of milligrams (mg) of salt (sodium) in one serving. Most people should limit total sodium intake to less than 2,300 mg per day.  Always check the nutrition information of foods labeled as "low-fat" or "nonfat." These foods may be higher in added sugar or refined carbs and should be avoided.  Talk to your dietitian to identify your daily goals for nutrients listed on the label. Shopping  Avoid buying canned, pre-made, or processed foods. These foods tend to be high in fat, sodium, and added  sugar.  Shop around the outside edge of the grocery store. This is where you will most often find fresh fruits and vegetables, bulk grains, fresh meats, and fresh dairy. Cooking  Use low-heat cooking methods, such as baking, instead of high-heat cooking methods like deep frying.  Cook using healthy oils, such as olive, canola, or sunflower oil.  Avoid cooking with butter, cream, or high-fat meats. Meal planning  Eat meals and snacks regularly, preferably at the same times every day. Avoid going long periods of time without eating.  Eat foods that are high in fiber, such as fresh fruits, vegetables, beans, and whole grains. Talk with your dietitian about how many servings of carbs you can eat at each meal.  Eat 4-6 oz (112-168 g) of lean protein each day, such as lean meat, chicken, fish, eggs, or tofu. One ounce (oz) of lean protein is equal to: ? 1 oz (28 g) of meat, chicken, or fish. ? 1 egg. ?  cup (62 g) of tofu.  Eat some foods each day that contain healthy fats, such as avocado, nuts, seeds, and fish.   What foods should I eat? Fruits Berries. Apples. Oranges. Peaches. Apricots. Plums. Grapes. Mango. Papaya. Pomegranate. Kiwi. Cherries. Vegetables Lettuce. Spinach. Leafy greens, including kale, chard, collard greens, and mustard greens. Beets. Cauliflower. Cabbage. Broccoli. Carrots. Green beans. Tomatoes. Peppers. Onions. Cucumbers. Brussels sprouts. Grains Whole grains, such as whole-wheat or whole-grain bread, crackers, tortillas, cereal, and pasta. Unsweetened oatmeal. Quinoa. Brown or wild rice. Meats and other proteins Seafood. Poultry without skin. Lean cuts of poultry and beef. Tofu. Nuts. Seeds. Dairy Low-fat or fat-free dairy products such as milk, yogurt, and cheese. The items listed above may not be a complete list of foods and beverages you can eat. Contact a dietitian for more information. What foods should I avoid? Fruits Fruits canned with  syrup. Vegetables Canned vegetables. Frozen vegetables with butter or cream sauce. Grains Refined white flour and flour products such as bread, pasta, snack foods, and cereals. Avoid all processed foods. Meats and other proteins Fatty cuts of meat. Poultry with skin. Breaded or fried meats. Processed meat. Avoid saturated fats. Dairy Full-fat yogurt, cheese, or milk. Beverages Sweetened drinks, such as soda or iced tea. The items listed above may not be a complete list of foods and beverages you should avoid. Contact a dietitian for more information. Questions to ask a health care provider  Do I need to meet with a diabetes educator?  Do I need to meet with a dietitian?  What number can I call if I have questions?  When are the best times to check my blood glucose? Where to find more information:  American Diabetes Association: diabetes.org  Academy of Nutrition and Dietetics: www.eatright.org  National Institute of Diabetes and Digestive and Kidney Diseases: www.niddk.nih.gov  Association of Diabetes Care and Education Specialists: www.diabeteseducator.org Summary  It is important to have healthy eating   habits because your blood sugar (glucose) levels are greatly affected by what you eat and drink.  A healthy meal plan will help you control your blood glucose and maintain a healthy lifestyle.  Your health care provider may recommend that you work with a dietitian to make a meal plan that is best for you.  Keep in mind that carbohydrates (carbs) and alcohol have immediate effects on your blood glucose levels. It is important to count carbs and to use alcohol carefully. This information is not intended to replace advice given to you by your health care provider. Make sure you discuss any questions you have with your health care provider. Document Revised: 08/23/2019 Document Reviewed: 08/23/2019 Elsevier Patient Education  2021 [employer]  

## 2021-06-19 NOTE — Progress Notes (Signed)
I,YAMILKA J Llittleton,acting as a Education administrator for [name], MD.,have documented all relevant documentation on the behalf of [name], MD,as directed by  [name], MD while in the presence of [name], MD.  This visit occurred during the SARS-CoV-2 public health emergency.  Safety protocols were in place, including screening questions prior to the visit, additional usage of staff PPE, and extensive cleaning of exam room while observing appropriate contact time as indicated for disinfecting solutions.  Subjective:     Patient ID: Nicolas [name] , male    DOB: 05/26/1976 , 45 y.o.   MRN: [id number]   Chief Complaint  Patient presents with   Diabetes   Hypertension    HPI  He presents today for diabetes and HTN follow-up.  He reports compliance with meds. He denies visual disturbances, chest pain and shortness of breath.   Diabetes He presents for his follow-up diabetic visit. He has type 2 diabetes mellitus. There are no hypoglycemic associated symptoms. Pertinent negatives for diabetes include no blurred vision and no chest pain. There are no hypoglycemic complications. Diabetic complications include nephropathy. Risk factors for coronary artery disease include diabetes mellitus, dyslipidemia, hypertension, male sex, sedentary lifestyle and obesity. Current diabetic treatment includes oral agent (dual therapy). He is following a diabetic diet. He participates in exercise intermittently. His home blood glucose trend is fluctuating minimally. His breakfast blood glucose is taken between 8-9 am. His breakfast blood glucose range is generally 110-130 mg/dl. An ACE inhibitor/angiotensin II receptor blocker is being taken.  Hypertension This is a chronic problem. The current episode started more than 1 year ago. The problem has been gradually improving since onset. The problem is controlled. Pertinent negatives include no blurred vision, chest pain, palpitations or shortness of  breath. Past treatments include ACE inhibitors, calcium channel blockers and beta blockers. The current treatment provides moderate improvement. Compliance problems include exercise.  Hypertensive end-organ damage includes kidney disease.    Past Medical History:  Diagnosis Date   Arthritis    arthritis- back & L hip   Depression    situational depression, pt. out of work    Diabetes mellitus ([place])    HTN (hypertension) 05/10/2015   Osteoarthritis of left hip 06/07/2012   Sickle cell anemia (HCC)      Family History  Problem Relation Age of Onset   Cancer - Other Father    Liver cancer Father    Diabetes Mother    CAD Neg Hx      Current Outpatient Medications:    amLODipine (NORVASC) 10 MG tablet, TAKE 1 TABLET BY MOUTH EVERY DAY, Disp: 90 tablet, Rfl: 1   carvedilol (COREG) 6.25 MG tablet, TAKE 1 TABLET BY MOUTH TWICE A DAY WITH MEALS, Disp: 180 tablet, Rfl: 1   Cholecalciferol (VITAMIN D3) 2000 units capsule, Take 2,000 Units by mouth daily., Disp: , Rfl: 5   cyclobenzaprine (FLEXERIL) 10 MG tablet, Take 10 mg by mouth 3 (three) times daily as needed for muscle spasms., Disp: , Rfl:    diclofenac Sodium (VOLTAREN) 1 % GEL, Apply 2 g topically 3 (three) times daily as needed., Disp: 272 g, Rfl: 1   folic acid (FOLVITE) 1 MG tablet, Take 1 mg by mouth daily., Disp: , Rfl: 11   glucose blood test strip, Use as instructed (Patient taking differently: 1 each by Other route every other day. Use as instructed), Disp: 100 each, Rfl: 12   Lancets (ACCU-CHEK MULTICLIX) lancets, Use as instructed (Patient taking  differently: 1 each by Other route every other day.), Disp: 100 each, Rfl: 12   lisinopril (ZESTRIL) 10 MG tablet, TAKE 2 TABLETS BY MOUTH EVERY DAY, Disp: 180 tablet, Rfl: 2   metFORMIN (GLUCOPHAGE) 500 MG tablet, TAKE 1 TABLET BY MOUTH TWICE A DAY, Disp: 180 tablet, Rfl: 2   Multiple Vitamin (ONE-A-DAY MENS PO), Take by mouth. 1 daily, Disp: , Rfl:    OXBRYTA 500 MG TABS tablet,  Take by mouth., Disp: , Rfl:    oxyCODONE-acetaminophen (PERCOCET) 7.5-325 MG tablet, Take 1 tablet by mouth every 4 (four) hours as needed for severe pain., Disp: , Rfl:    OZEMPIC, 0.25 OR 0.5 MG/DOSE, 2 MG/1.5ML SOPN, INJECT 0.5 MG INTO THE SKIN ONCE A WEEK., Disp: 9 mL, Rfl: 3   pravastatin (PRAVACHOL) 20 MG tablet, TAKE 1 TABLET BY MOUTH EVERY DAY IN THE EVENING, Disp: 90 tablet, Rfl: 0   triamterene-hydrochlorothiazide (MAXZIDE-25) 37.5-25 MG tablet, TAKE 1 TABLET BY MOUTH EVERY DAY, Disp: 90 tablet, Rfl: 1   No Known Allergies   Review of Systems  Constitutional: Negative.   Eyes: Negative.  Negative for blurred vision.  Respiratory: Negative.  Negative for shortness of breath.   Cardiovascular: Negative.  Negative for chest pain and palpitations.  Gastrointestinal: Negative.   Musculoskeletal:  Positive for arthralgias.       He admits to having sickle cell crisis last month. He is not sure what triggered this episode. States he was in pain. Did not contact hematologist. Admits he did not notify this office as well.   Skin:        He c/o bump on his back. States it has been there for a while. Thinks it needs to be drained.   Neurological: Negative.   Psychiatric/Behavioral: Negative.      Today's Vitals   06/19/21 1526  BP: 136/78  Pulse: 91  Temp: 99.2 F (37.3 C)  Weight: 237 lb (107.5 kg)  Height: '5\' 8"'  (1.727 m)  PainSc: 7   PainLoc: Back   Body mass index is 36.04 kg/m.  Wt Readings from Last 3 Encounters:  06/19/21 237 lb (107.5 kg)  02/20/21 236 lb 12.8 oz (107.4 kg)  02/13/21 232 lb 6.4 oz (105.4 kg)     Objective:  Physical Exam Vitals and nursing note reviewed.  Constitutional:      Appearance: Normal appearance. He is obese.  HENT:     Head: Normocephalic and atraumatic.     Nose:     Comments: Masked     Mouth/Throat:     Comments: Masked  Eyes:     Extraocular Movements: Extraocular movements intact.  Cardiovascular:     Rate and Rhythm:  Normal rate and regular rhythm.     Heart sounds: Normal heart sounds.  Pulmonary:     Effort: Pulmonary effort is normal.     Breath sounds: Normal breath sounds.  Musculoskeletal:     Right elbow: Swelling present. No lacerations. Normal range of motion. No tenderness.     Left elbow: Normal.     Cervical back: Normal range of motion.     Comments: Swelling r elbow, no erythema, no warmth  Skin:    General: Skin is warm.     Comments: Comedone located mid-back. No surrounding erythema  Neurological:     General: No focal deficit present.     Mental Status: He is alert.  Psychiatric:        Mood and Affect: Mood normal.  Assessment And Plan:     1. Type 2 diabetes mellitus with stage 2 chronic kidney disease, without long-term current use of insulin (HCC) Comments: Chronic, I will check labs as listed below. I will check fructosamine instead of a1c due to h/o SCD. Encouraged to follow dietary guildelines. - BMP8+EGFR - Fructosamine  2. Hypertensive nephropathy Comments: Chronic, fair control, not yet at goal. Goal BP is less than 130/80. Encouraged to follow low soidum diet and to increase daily activity.  3. Olecranon bursitis of right elbow Comments: He was advised to apply Voltaren gel to affected area bid-tid. He will let me know if his sx persist.   4. Comedone Comments: After verbal consent and cleaning the area w/ iodine, I extracted one comedone on his midback. This was performed w/o complication.   5. Sickle cell disease without crisis ([place]) Comments: He reports having a crisis last month. He admits he did not contact myself or the hematologist. He is encouraged to stay hydrated and limit known triggers.   6. Class 2 severe obesity due to excess calories with serious comorbidity and body mass index (BMI) of 36.0 to 36.9 in adult [facility]) Comments: He is encouraged to lose ten percent of his body weight to decrease cardiac risk. Advised to aim for at least 150  minutes of exercise per week.   7. Influenza vaccination declined  Patient was given opportunity to ask questions. Patient verbalized understanding of the plan and was able to repeat key elements of the plan. All questions were answered to their satisfaction.   I, [name], MD, have reviewed all documentation for this visit. The documentation on 06/19/21 for the exam, diagnosis, procedures, and orders are all accurate and complete.   IF YOU HAVE BEEN REFERRED TO A SPECIALIST, IT MAY TAKE 1-2 WEEKS TO SCHEDULE/PROCESS THE REFERRAL. IF YOU HAVE NOT HEARD FROM US/SPECIALIST IN TWO WEEKS, PLEASE GIVE [place] A CALL AT [phone].   THE PATIENT IS ENCOURAGED TO PRACTICE SOCIAL DISTANCING DUE TO THE COVID-19 PANDEMIC.

## 2021-06-20 LAB — BMP8+EGFR
BUN/Creatinine Ratio: 11 (ref 9–20)
BUN: 14 mg/dL (ref 6–24)
CO2: 26 mmol/L (ref 20–29)
Calcium: 9.5 mg/dL (ref 8.7–10.2)
Chloride: 97 mmol/L (ref 96–106)
Creatinine, Ser: 1.25 mg/dL (ref 0.76–1.27)
Glucose: 182 mg/dL — ABNORMAL HIGH (ref 65–99)
Potassium: 3.4 mmol/L — ABNORMAL LOW (ref 3.5–5.2)
Sodium: 141 mmol/L (ref 134–144)
eGFR: 71 mL/min/{1.73_m2} (ref >59)

## 2021-06-20 LAB — FRUCTOSAMINE: Fructosamine: 268 umol/L (ref 0–285)

## 2021-07-01 DIAGNOSIS — E559 Vitamin D deficiency, unspecified: Secondary | ICD-10-CM | POA: Diagnosis not present

## 2021-07-01 DIAGNOSIS — G8929 Other chronic pain: Secondary | ICD-10-CM | POA: Diagnosis not present

## 2021-07-01 DIAGNOSIS — M8700 Idiopathic aseptic necrosis of unspecified bone: Secondary | ICD-10-CM | POA: Diagnosis not present

## 2021-08-17 ENCOUNTER — Encounter: Payer: Self-pay | Admitting: Internal Medicine

## 2021-08-26 ENCOUNTER — Encounter: Payer: Self-pay | Admitting: Internal Medicine

## 2021-08-29 DIAGNOSIS — L730 Acne keloid: Secondary | ICD-10-CM | POA: Diagnosis not present

## 2021-08-30 ENCOUNTER — Other Ambulatory Visit: Payer: Self-pay | Admitting: Internal Medicine

## 2021-08-30 ENCOUNTER — Other Ambulatory Visit: Payer: Self-pay | Admitting: Cardiology

## 2021-09-03 ENCOUNTER — Other Ambulatory Visit: Payer: Self-pay | Admitting: Internal Medicine

## 2021-10-23 ENCOUNTER — Other Ambulatory Visit: Payer: Self-pay

## 2021-10-23 ENCOUNTER — Encounter: Payer: Self-pay | Admitting: Internal Medicine

## 2021-10-23 ENCOUNTER — Ambulatory Visit (INDEPENDENT_AMBULATORY_CARE_PROVIDER_SITE_OTHER): Payer: Medicare Other | Admitting: Internal Medicine

## 2021-10-23 VITALS — BP 122/80 | HR 103 | Temp 98.2°F | Ht 68.0 in | Wt 232.2 lb

## 2021-10-23 DIAGNOSIS — E66812 Obesity, class 2: Secondary | ICD-10-CM

## 2021-10-23 DIAGNOSIS — E1122 Type 2 diabetes mellitus with diabetic chronic kidney disease: Secondary | ICD-10-CM

## 2021-10-23 DIAGNOSIS — D571 Sickle-cell disease without crisis: Secondary | ICD-10-CM

## 2021-10-23 DIAGNOSIS — G8929 Other chronic pain: Secondary | ICD-10-CM

## 2021-10-23 DIAGNOSIS — I129 Hypertensive chronic kidney disease with stage 1 through stage 4 chronic kidney disease, or unspecified chronic kidney disease: Secondary | ICD-10-CM | POA: Diagnosis not present

## 2021-10-23 DIAGNOSIS — N182 Chronic kidney disease, stage 2 (mild): Secondary | ICD-10-CM

## 2021-10-23 DIAGNOSIS — Z1211 Encounter for screening for malignant neoplasm of colon: Secondary | ICD-10-CM

## 2021-10-23 DIAGNOSIS — Z6835 Body mass index (BMI) 35.0-35.9, adult: Secondary | ICD-10-CM

## 2021-10-23 DIAGNOSIS — M545 Low back pain, unspecified: Secondary | ICD-10-CM | POA: Diagnosis not present

## 2021-10-23 NOTE — Patient Instructions (Signed)

## 2021-10-23 NOTE — Progress Notes (Signed)
[name],acting as a Education administrator for [name], MD.,have documented all relevant documentation on the behalf of [name], MD,as directed by  [name], MD while in the presence of [name], MD.  This visit occurred during the SARS-CoV-2 public health emergency.  Safety protocols were in place, including screening questions prior to the visit, additional usage of staff PPE, and extensive cleaning of exam room while observing appropriate contact time as indicated for disinfecting solutions.  Subjective:     Patient ID: Nicolas [name] , male    DOB: 09-20-1976 , 46 y.o.   MRN: [id number]   Chief Complaint  Patient presents with   Diabetes   Hypertension    HPI  He presents today for diabetes and HTN follow-up.  He reports compliance with meds. He denies visual disturbances, chest pain and shortness of breath.   Diabetes He presents for his follow-up diabetic visit. He has type 2 diabetes mellitus. There are no hypoglycemic associated symptoms. Pertinent negatives for diabetes include no blurred vision, no chest pain, no polydipsia, no polyphagia and no polyuria. There are no hypoglycemic complications. Diabetic complications include nephropathy. Risk factors for coronary artery disease include diabetes mellitus, dyslipidemia, hypertension, male sex, sedentary lifestyle and obesity. Current diabetic treatment includes oral agent (dual therapy). He is following a diabetic diet. He participates in exercise intermittently. His home blood glucose trend is fluctuating minimally. His breakfast blood glucose is taken between 8-9 am. His breakfast blood glucose range is generally 110-130 mg/dl. An ACE inhibitor/angiotensin II receptor blocker is being taken.  Hypertension This is a chronic problem. The current episode started more than 1 year ago. The problem has been gradually improving since onset. The problem is controlled. Pertinent negatives include no blurred vision,  chest pain, palpitations or shortness of breath. Past treatments include ACE inhibitors, calcium channel blockers and beta blockers. The current treatment provides moderate improvement. Compliance problems include exercise.  Hypertensive end-organ damage includes kidney disease.    Past Medical History:  Diagnosis Date   Arthritis    arthritis- back & L hip   Depression    situational depression, pt. out of work    Diabetes mellitus ([place])    HTN (hypertension) 05/10/2015   Osteoarthritis of left hip 06/07/2012   Sickle cell anemia (HCC)      Family History  Problem Relation Age of Onset   Cancer - Other Father    Liver cancer Father    Diabetes Mother    CAD Neg Hx      Current Outpatient Medications:    amLODipine (NORVASC) 10 MG tablet, TAKE 1 TABLET BY MOUTH EVERY DAY, Disp: 90 tablet, Rfl: 1   carvedilol (COREG) 6.25 MG tablet, TAKE 1 TABLET BY MOUTH TWICE A DAY WITH MEALS, Disp: 180 tablet, Rfl: 1   Cholecalciferol (VITAMIN D3) 2000 units capsule, Take 2,000 Units by mouth daily., Disp: , Rfl: 5   cyclobenzaprine (FLEXERIL) 10 MG tablet, Take 10 mg by mouth 3 (three) times daily as needed for muscle spasms., Disp: , Rfl:    diclofenac Sodium (VOLTAREN) 1 % GEL, Apply 2 g topically 3 (three) times daily as needed., Disp: 563 g, Rfl: 1   folic acid (FOLVITE) 1 MG tablet, Take 1 mg by mouth daily., Disp: , Rfl: 11   glucose blood test strip, Use as instructed (Patient taking differently: 1 each by Other route every other day. Use as instructed), Disp: 100 each, Rfl: 12   Lancets (ACCU-CHEK  MULTICLIX) lancets, Use as instructed (Patient taking differently: 1 each by Other route every other day.), Disp: 100 each, Rfl: 12   lisinopril (ZESTRIL) 10 MG tablet, TAKE 2 TABLETS BY MOUTH EVERY DAY, Disp: 180 tablet, Rfl: 2   metFORMIN (GLUCOPHAGE) 500 MG tablet, TAKE 1 TABLET BY MOUTH TWICE A DAY, Disp: 180 tablet, Rfl: 2   Multiple Vitamin (ONE-A-DAY MENS PO), Take by mouth. 1 daily, Disp: ,  Rfl:    OXBRYTA 500 MG TABS tablet, Take by mouth., Disp: , Rfl:    oxyCODONE-acetaminophen (PERCOCET) 7.5-325 MG tablet, Take 1 tablet by mouth every 4 (four) hours as needed for severe pain., Disp: , Rfl:    OZEMPIC, 0.25 OR 0.5 MG/DOSE, 2 MG/1.5ML SOPN, INJECT 0.5 MG INTO THE SKIN ONCE A WEEK., Disp: 9 mL, Rfl: 3   pravastatin (PRAVACHOL) 20 MG tablet, TAKE 1 TABLET BY MOUTH EVERY DAY IN THE EVENING, Disp: 90 tablet, Rfl: 0   triamterene-hydrochlorothiazide (MAXZIDE-25) 37.5-25 MG tablet, TAKE 1 TABLET BY MOUTH EVERY DAY, Disp: 90 tablet, Rfl: 1   No Known Allergies   Review of Systems  Constitutional: Negative.   Eyes:  Negative for blurred vision.  Respiratory: Negative.  Negative for shortness of breath.   Cardiovascular: Negative.  Negative for chest pain and palpitations.  Gastrointestinal: Negative.   Endocrine: Negative for polydipsia, polyphagia and polyuria.  Musculoskeletal:  Positive for back pain.       He c/o left sided back pain. Denies fall/trauma. This is a chronic issue, but he has had a recent flare. Not sure what may have triggered his sx. Denies LE weakness/paresthesias.   Neurological: Negative.   Psychiatric/Behavioral: Negative.      Today's Vitals   10/23/21 1540  BP: 122/80  Pulse: (!) 103  Temp: 98.2 F (36.8 C)  Weight: 232 lb 3.2 oz (105.3 kg)  Height: 5' 8" (1.727 m)  PainSc: 7   PainLoc: Back   Body mass index is 35.31 kg/m.   Objective:  Physical Exam Vitals and nursing note reviewed.  Constitutional:      Appearance: Normal appearance.  HENT:     Head: Normocephalic and atraumatic.     Nose:     Comments: Masked     Mouth/Throat:     Comments: Masked  Cardiovascular:     Rate and Rhythm: Normal rate and regular rhythm.     Heart sounds: Normal heart sounds.  Pulmonary:     Effort: Pulmonary effort is normal.     Breath sounds: Normal breath sounds.  Musculoskeletal:     Cervical back: Normal range of motion.  Skin:    General:  Skin is warm.  Neurological:     General: No focal deficit present.     Mental Status: He is alert.  Psychiatric:        Mood and Affect: Mood normal.        Assessment And Plan:     1. Type 2 diabetes mellitus with stage 2 chronic kidney disease, without long-term current use of insulin (HCC) Comments: Chronic, I will check labs as below. Importance of dietary/medication and exercise compliance was d/w patient. He will rto in 4 months for re-evaluation.  - Hemoglobin A1c - Fructosamine - CMP14+EGFR  2. Hypertensive nephropathy Comments: Chronic, well controlled. He is encouraged to follow low sodium diet. No med changes today.   3. Sickle cell disease without crisis ([place]) Comments: Chronic, he needs referral to new provider. He states his current provider at [facility]  has retired, he wants provider closer to home. He is encouraged to stay well hydrated.   4. Chronic left-sided low back pain without sciatica Comments: Acute flare. He was given stretching exercises to perform, including Figure 4 stretch. He would also likely benefit from hip opening stretches. IF persistent, will consider PT.   5. Class 2 severe obesity due to excess calories with serious comorbidity and body mass index (BMI) of 35.0 to 35.9 in adult [facility]) Comments: HE is encouraged to strive for BMI less than 30 to decrease cardiac risk. Advised to aim for at least 150 minutes of exercise per week.  6. Screen for colon cancer Comments: He agrees to GI referral for CRC screening. - Ambulatory referral to Gastroenterology   Patient was given opportunity to ask questions. Patient verbalized understanding of the plan and was able to repeat key elements of the plan. All questions were answered to their satisfaction.   I, [name], MD, have reviewed all documentation for this visit. The documentation on 10/23/21 for the exam, diagnosis, procedures, and orders are all accurate and complete.   IF YOU HAVE BEEN  REFERRED TO A SPECIALIST, IT MAY TAKE 1-2 WEEKS TO SCHEDULE/PROCESS THE REFERRAL. IF YOU HAVE NOT HEARD FROM US/SPECIALIST IN TWO WEEKS, PLEASE GIVE [place] A CALL AT [phone].   THE PATIENT IS ENCOURAGED TO PRACTICE SOCIAL DISTANCING DUE TO THE COVID-19 PANDEMIC.

## 2021-10-24 LAB — CMP14+EGFR
ALT: 44 IU/L (ref 0–44)
AST: 42 IU/L — ABNORMAL HIGH (ref 0–40)
Albumin/Globulin Ratio: 1.9 (ref 1.2–2.2)
Albumin: 4.8 g/dL (ref 4.0–5.0)
Alkaline Phosphatase: 61 IU/L (ref 44–121)
BUN/Creatinine Ratio: 9 (ref 9–20)
BUN: 10 mg/dL (ref 6–24)
Bilirubin Total: 0.4 mg/dL (ref 0.0–1.2)
CO2: 28 mmol/L (ref 20–29)
Calcium: 9.3 mg/dL (ref 8.7–10.2)
Chloride: 100 mmol/L (ref 96–106)
Creatinine, Ser: 1.15 mg/dL (ref 0.76–1.27)
Globulin, Total: 2.5 g/dL (ref 1.5–4.5)
Glucose: 140 mg/dL — ABNORMAL HIGH (ref 70–99)
Potassium: 4 mmol/L (ref 3.5–5.2)
Sodium: 144 mmol/L (ref 134–144)
Total Protein: 7.3 g/dL (ref 6.0–8.5)
eGFR: 79 mL/min/{1.73_m2} (ref >59)

## 2021-10-24 LAB — HEMOGLOBIN A1C
Est. average glucose Bld gHb Est-mCnc: 82 mg/dL
Hgb A1c MFr Bld: 4.5 % — ABNORMAL LOW (ref 4.8–5.6)

## 2021-10-24 LAB — FRUCTOSAMINE: Fructosamine: 246 umol/L (ref 0–285)

## 2021-11-09 ENCOUNTER — Other Ambulatory Visit: Payer: Self-pay | Admitting: Internal Medicine

## 2021-11-11 DIAGNOSIS — Z1211 Encounter for screening for malignant neoplasm of colon: Secondary | ICD-10-CM | POA: Diagnosis not present

## 2021-11-11 DIAGNOSIS — I10 Essential (primary) hypertension: Secondary | ICD-10-CM | POA: Diagnosis not present

## 2021-11-11 DIAGNOSIS — E119 Type 2 diabetes mellitus without complications: Secondary | ICD-10-CM | POA: Diagnosis not present

## 2021-12-31 ENCOUNTER — Other Ambulatory Visit: Payer: Self-pay | Admitting: Internal Medicine

## 2022-01-20 DIAGNOSIS — M8700 Idiopathic aseptic necrosis of unspecified bone: Secondary | ICD-10-CM | POA: Diagnosis not present

## 2022-01-20 DIAGNOSIS — M7918 Myalgia, other site: Secondary | ICD-10-CM | POA: Diagnosis not present

## 2022-01-20 DIAGNOSIS — G8929 Other chronic pain: Secondary | ICD-10-CM | POA: Diagnosis not present

## 2022-01-20 DIAGNOSIS — Z79891 Long term (current) use of opiate analgesic: Secondary | ICD-10-CM | POA: Diagnosis not present

## 2022-01-20 DIAGNOSIS — E559 Vitamin D deficiency, unspecified: Secondary | ICD-10-CM | POA: Diagnosis not present

## 2022-01-20 DIAGNOSIS — Z9189 Other specified personal risk factors, not elsewhere classified: Secondary | ICD-10-CM | POA: Diagnosis not present

## 2022-01-21 ENCOUNTER — Other Ambulatory Visit: Payer: Self-pay | Admitting: Internal Medicine

## 2022-01-21 DIAGNOSIS — Z1211 Encounter for screening for malignant neoplasm of colon: Secondary | ICD-10-CM | POA: Diagnosis not present

## 2022-01-21 DIAGNOSIS — D571 Sickle-cell disease without crisis: Secondary | ICD-10-CM

## 2022-01-21 LAB — HM COLONOSCOPY

## 2022-01-24 DIAGNOSIS — H538 Other visual disturbances: Secondary | ICD-10-CM | POA: Diagnosis not present

## 2022-01-24 DIAGNOSIS — E119 Type 2 diabetes mellitus without complications: Secondary | ICD-10-CM | POA: Diagnosis not present

## 2022-01-24 DIAGNOSIS — H5203 Hypermetropia, bilateral: Secondary | ICD-10-CM | POA: Diagnosis not present

## 2022-01-24 LAB — HM DIABETES EYE EXAM

## 2022-01-28 ENCOUNTER — Encounter: Payer: Self-pay | Admitting: Internal Medicine

## 2022-02-03 DIAGNOSIS — E871 Hypo-osmolality and hyponatremia: Secondary | ICD-10-CM | POA: Diagnosis not present

## 2022-02-03 DIAGNOSIS — R809 Proteinuria, unspecified: Secondary | ICD-10-CM | POA: Diagnosis not present

## 2022-02-03 DIAGNOSIS — E119 Type 2 diabetes mellitus without complications: Secondary | ICD-10-CM | POA: Diagnosis not present

## 2022-02-03 DIAGNOSIS — I10 Essential (primary) hypertension: Secondary | ICD-10-CM | POA: Diagnosis not present

## 2022-02-03 DIAGNOSIS — N183 Chronic kidney disease, stage 3 unspecified: Secondary | ICD-10-CM | POA: Diagnosis not present

## 2022-02-03 DIAGNOSIS — Z79899 Other long term (current) drug therapy: Secondary | ICD-10-CM | POA: Diagnosis not present

## 2022-02-11 ENCOUNTER — Other Ambulatory Visit: Payer: Self-pay | Admitting: Internal Medicine

## 2022-02-20 ENCOUNTER — Ambulatory Visit (INDEPENDENT_AMBULATORY_CARE_PROVIDER_SITE_OTHER): Payer: Medicare Other | Admitting: Internal Medicine

## 2022-02-20 ENCOUNTER — Encounter: Payer: Self-pay | Admitting: Internal Medicine

## 2022-02-20 VITALS — BP 122/80 | HR 88 | Temp 97.9°F | Ht 66.6 in | Wt 223.6 lb

## 2022-02-20 DIAGNOSIS — N182 Chronic kidney disease, stage 2 (mild): Secondary | ICD-10-CM | POA: Diagnosis not present

## 2022-02-20 DIAGNOSIS — Z6835 Body mass index (BMI) 35.0-35.9, adult: Secondary | ICD-10-CM

## 2022-02-20 DIAGNOSIS — Z0000 Encounter for general adult medical examination without abnormal findings: Secondary | ICD-10-CM | POA: Diagnosis not present

## 2022-02-20 DIAGNOSIS — R809 Proteinuria, unspecified: Secondary | ICD-10-CM | POA: Diagnosis not present

## 2022-02-20 DIAGNOSIS — E876 Hypokalemia: Secondary | ICD-10-CM | POA: Diagnosis not present

## 2022-02-20 DIAGNOSIS — E1165 Type 2 diabetes mellitus with hyperglycemia: Secondary | ICD-10-CM | POA: Diagnosis not present

## 2022-02-20 DIAGNOSIS — E1122 Type 2 diabetes mellitus with diabetic chronic kidney disease: Secondary | ICD-10-CM | POA: Diagnosis not present

## 2022-02-20 DIAGNOSIS — I129 Hypertensive chronic kidney disease with stage 1 through stage 4 chronic kidney disease, or unspecified chronic kidney disease: Secondary | ICD-10-CM

## 2022-02-20 LAB — POCT URINALYSIS DIPSTICK
Bilirubin, UA: NEGATIVE
Blood, UA: NEGATIVE
Glucose, UA: NEGATIVE
Ketones, UA: NEGATIVE
Leukocytes, UA: NEGATIVE
Nitrite, UA: NEGATIVE
Protein, UA: POSITIVE — AB
Spec Grav, UA: 1.015 (ref 1.010–1.025)
Urobilinogen, UA: 0.2 E.U./dL
pH, UA: 7 (ref 5.0–8.0)

## 2022-02-20 NOTE — Progress Notes (Signed)
Nicolas [name],acting as a Education administrator for Nicolas Greenland, MD.,have documented all relevant documentation on the behalf of Nicolas Greenland, MD,as directed by  Nicolas Greenland, MD while in the presence of Nicolas Greenland, MD.  This visit occurred during the SARS-CoV-2 public health emergency.  Safety protocols were in place, including screening questions prior to the visit, additional usage of staff PPE, and extensive cleaning of exam room while observing appropriate contact time as indicated for disinfecting solutions.  Subjective:     Patient ID: Nicolas [name] , male    DOB: 28-Oct-1975 , 46 y.o.   MRN: [id number]   Chief Complaint  Patient presents with   Annual Exam   Diabetes   Hypertension    HPI  He is here today for a full physical exam.  He has no specific concerns or complaints at this time.  He reports compliance with meds, denies headaches, chest pain and shortness of breath. Does not wish to have prostate exam performed today.   Diabetes He presents for his follow-up diabetic visit. He has type 2 diabetes mellitus. There are no hypoglycemic associated symptoms. Pertinent negatives for diabetes include no blurred vision. There are no hypoglycemic complications. Diabetic complications include nephropathy. Risk factors for coronary artery disease include diabetes mellitus, dyslipidemia, hypertension, male sex, sedentary lifestyle and obesity. Current diabetic treatment includes oral agent (dual therapy). He is following a diabetic diet. He participates in exercise intermittently. His home blood glucose trend is fluctuating minimally. His breakfast blood glucose is taken between 8-9 am. His breakfast blood glucose range is generally 110-130 mg/dl. An ACE inhibitor/angiotensin II receptor blocker is being taken. He does not see a podiatrist.Eye exam is current.  Hypertension This is a chronic problem. The current episode started more than 1 year ago. The problem has been gradually  improving since onset. The problem is controlled. Pertinent negatives include no blurred vision. Past treatments include ACE inhibitors, calcium channel blockers and beta blockers. The current treatment provides moderate improvement. Compliance problems include exercise.  Hypertensive end-organ damage includes kidney disease.    Past Medical History:  Diagnosis Date   Arthritis    arthritis- back & L hip   Depression    situational depression, pt. out of work    Diabetes mellitus ([place])    HTN (hypertension) 05/10/2015   Osteoarthritis of left hip 06/07/2012   Sickle cell anemia (HCC)      Family History  Problem Relation Age of Onset   Cancer - Other Father    Liver cancer Father    Diabetes Mother    CAD Neg Hx      Current Outpatient Medications:    amLODipine (NORVASC) 10 MG tablet, TAKE 1 TABLET BY MOUTH EVERY DAY, Disp: 90 tablet, Rfl: 1   carvedilol (COREG) 6.25 MG tablet, TAKE 1 TABLET BY MOUTH TWICE A DAY WITH MEALS, Disp: 180 tablet, Rfl: 1   Cholecalciferol (VITAMIN D3) 2000 units capsule, Take 2,000 Units by mouth daily., Disp: , Rfl: 5   cyclobenzaprine (FLEXERIL) 10 MG tablet, Take 10 mg by mouth 3 (three) times daily as needed for muscle spasms., Disp: , Rfl:    diclofenac Sodium (VOLTAREN) 1 % GEL, Apply 2 g topically 3 (three) times daily as needed., Disp: 219 g, Rfl: 1   folic acid (FOLVITE) 1 MG tablet, Take 1 mg by mouth daily., Disp: , Rfl: 11   glucose blood test strip, Use as instructed (Patient taking differently: 1 each by Other route every  other day. Use as instructed), Disp: 100 each, Rfl: 12   Lancets (ACCU-CHEK MULTICLIX) lancets, Use as instructed (Patient taking differently: 1 each by Other route every other day.), Disp: 100 each, Rfl: 12   lisinopril (ZESTRIL) 10 MG tablet, TAKE 2 TABLETS BY MOUTH EVERY DAY, Disp: 180 tablet, Rfl: 2   metFORMIN (GLUCOPHAGE) 500 MG tablet, TAKE 1 TABLET BY MOUTH TWICE A DAY, Disp: 180 tablet, Rfl: 2   Multiple Vitamin  (ONE-A-DAY MENS PO), Take by mouth. 1 daily, Disp: , Rfl:    OXBRYTA 500 MG TABS tablet, Take by mouth., Disp: , Rfl:    oxyCODONE-acetaminophen (PERCOCET) 7.5-325 MG tablet, Take 1 tablet by mouth every 4 (four) hours as needed for severe pain., Disp: , Rfl:    OZEMPIC, 0.25 OR 0.5 MG/DOSE, 2 MG/1.5ML SOPN, INJECT 0.5 MG INTO THE SKIN ONCE A WEEK., Disp: 9 mL, Rfl: 3   pravastatin (PRAVACHOL) 20 MG tablet, TAKE 1 TABLET BY MOUTH EVERY DAY IN THE EVENING, Disp: 90 tablet, Rfl: 0   triamterene-hydrochlorothiazide (MAXZIDE-25) 37.5-25 MG tablet, TAKE 1 TABLET BY MOUTH EVERY DAY, Disp: 90 tablet, Rfl: 1   ferrous sulfate 325 (65 FE) MG EC tablet, Take by mouth., Disp: , Rfl:    No Known Allergies   Men's preventive visit. Patient Health Questionnaire (PHQ-2) is  [place] Office Visit from 02/20/2022 in [facility]  PHQ-2 Total Score 0     . Patient is on a low sodium diet. Marital status: Married. Relevant history for alcohol use is:  Social History   Substance and Sexual Activity  Alcohol Use Not Currently   Comment: ocassional   . Relevant history for tobacco use is:  Social History   Tobacco Use  Smoking Status Never  Smokeless Tobacco Never  .   Review of Systems  Constitutional: Negative.   HENT: Negative.    Eyes: Negative.  Negative for blurred vision.  Respiratory: Negative.    Cardiovascular: Negative.   Gastrointestinal: Negative.   Endocrine: Negative.   Genitourinary: Negative.   Musculoskeletal: Negative.   Skin: Negative.   Neurological: Negative.   Hematological: Negative.   Psychiatric/Behavioral: Negative.      Today's Vitals   02/20/22 1414  BP: 122/80  Pulse: 88  Temp: 97.9 F (36.6 C)  Weight: 223 lb 9.6 oz (101.4 kg)  Height: 5' 6.6" (1.692 m)  PainSc: 7   PainLoc: Back   Body mass index is 35.44 kg/m.  Wt Readings from Last 3 Encounters:  02/20/22 223 lb 9.6 oz (101.4 kg)  10/23/21 232 lb 3.2 oz (105.3 kg)   06/19/21 237 lb (107.5 kg)     Objective:  Physical Exam Vitals and nursing note reviewed.  Constitutional:      Appearance: Normal appearance.  HENT:     Head: Normocephalic and atraumatic.     Right Ear: Tympanic membrane, ear canal and external ear normal.     Left Ear: Tympanic membrane, ear canal and external ear normal.     Nose: Nose normal.     Mouth/Throat:     Mouth: Mucous membranes are moist.     Pharynx: Oropharynx is clear.  Eyes:     Extraocular Movements: Extraocular movements intact.     Conjunctiva/sclera: Conjunctivae normal.     Pupils: Pupils are equal, round, and reactive to light.  Cardiovascular:     Rate and Rhythm: Normal rate and regular rhythm.     Pulses: Normal pulses.  Dorsalis pedis pulses are 2+ on the right side and 2+ on the left side.     Heart sounds: Normal heart sounds.  Pulmonary:     Effort: Pulmonary effort is normal.     Breath sounds: Normal breath sounds.  Chest:  Breasts:    Right: Normal. No swelling, bleeding, inverted nipple, mass or nipple discharge.     Left: Normal. No swelling, bleeding, inverted nipple, mass or nipple discharge.  Abdominal:     General: Bowel sounds are normal.     Palpations: Abdomen is soft.     Comments: Rounded, obese, soft. Difficult to assess organomegaly  Genitourinary:    Comments: Deferred, as per patient Musculoskeletal:        General: Normal range of motion.     Cervical back: Normal range of motion and neck supple.  Feet:     Right foot:     Protective Sensation: 5 sites tested.  5 sites sensed.     Skin integrity: Callus and dry skin present.     Toenail Condition: Right toenails are normal.     Left foot:     Protective Sensation: 5 sites tested.  5 sites sensed.     Skin integrity: Callus and dry skin present.     Toenail Condition: Left toenails are normal.  Skin:    General: Skin is warm.  Neurological:     General: No focal deficit present.     Mental Status: He is  alert.  Psychiatric:        Mood and Affect: Mood normal.        Behavior: Behavior normal.      Assessment And Plan:    1. Encounter for general adult medical examination w/o abnormal findings Comments: CPE performed today.  He declined DRE today. PATIENT IS ADVISED TO GET 30-45 MINUTES REGULAR EXERCISE NO LESS THAN FOUR TO FIVE DAYS PER WEEK - BOTH WEIGHTBEARING EXERCISES AND AEROBIC ARE RECOMMENDED.  PATIENT IS ADVISED TO FOLLOW A HEALTHY DIET WITH AT LEAST SIX FRUITS/VEGGIES PER DAY, DECREASE INTAKE OF RED MEAT, AND TO INCREASE FISH INTAKE TO TWO DAYS PER WEEK.  MEATS/FISH SHOULD NOT BE FRIED, BAKED OR BROILED IS PREFERABLE.  IT IS ALSO IMPORTANT TO CUT BACK ON YOUR SUGAR INTAKE. PLEASE AVOID ANYTHING WITH ADDED SUGAR, CORN SYRUP OR OTHER SWEETENERS. IF YOU MUST USE A SWEETENER, YOU CAN TRY STEVIA. IT IS ALSO IMPORTANT TO AVOID ARTIFICIALLY SWEETENERS AND DIET BEVERAGES. LASTLY, I SUGGEST WEARING SPF 50 SUNSCREEN ON EXPOSED PARTS AND ESPECIALLY WHEN IN THE DIRECT SUNLIGHT FOR AN EXTENDED PERIOD OF TIME.  PLEASE AVOID FAST FOOD RESTAURANTS AND INCREASE YOUR WATER INTAKE. - PSA - Lipid panel - Urine microalbumin-creatinine with uACR  2. Type 2 diabetes mellitus with stage 2 chronic kidney disease, without long-term current use of insulin (HCC) Comments: Diabetic foot exam was performed. He will rto in 4 months for re-evaluation.  I DISCUSSED WITH THE PATIENT AT LENGTH REGARDING THE GOALS OF GLYCEMIC CONTROL AND POSSIBLE LONG-TERM COMPLICATIONS.  I  ALSO STRESSED THE IMPORTANCE OF COMPLIANCE WITH HOME GLUCOSE MONITORING, DIETARY RESTRICTIONS INCLUDING AVOIDANCE OF SUGARY DRINKS/PROCESSED FOODS,  ALONG WITH REGULAR EXERCISE.  I  ALSO STRESSED THE IMPORTANCE OF ANNUAL EYE EXAMS, SELF FOOT CARE AND COMPLIANCE WITH OFFICE VISITS. - Hemoglobin A1c - Urine microalbumin-creatinine with uACR  3. Hypertensive nephropathy Comments: Chronic, well controlled. EKG performed, NSR w/o acute changes. He is  encouraged to follow low sodium diet. He will f/u in four months for  re-evaluation.  - POCT Urinalysis Dipstick (81002) - EKG 12-Lead - BMP8+EGFR  4. Hypokalemia Comments: Potassium 3.3 on April 2023 labs in [place]. I will recheck this today.  - BMP8+EGFR  5. Proteinuria, unspecified type Comments: Pt has type 2 diabetes as well as SCD. He was previously followed in [place], wants to transfer to [facility] provider. He would benefit from [place], he agrees to discuss at future visit.   6. Class 2 severe obesity due to excess calories with serious comorbidity and body mass index (BMI) of 35.0 to 35.9 in adult [facility]) He is encouraged to initially strive for BMI less than 30 to decrease cardiac risk. He is advised to exercise no less than 150 minutes per week.    Patient was given opportunity to ask questions. Patient verbalized understanding of the plan and was able to repeat key elements of the plan. All questions were answered to their satisfaction.   I, Nicolas Greenland, MD, have reviewed all documentation for this visit. The documentation on 02/22/22 for the exam, diagnosis, procedures, and orders are all accurate and complete.   THE PATIENT IS ENCOURAGED TO PRACTICE SOCIAL DISTANCING DUE TO THE COVID-19 PANDEMIC.

## 2022-02-20 NOTE — Patient Instructions (Signed)
Health Maintenance, Male Adopting a healthy lifestyle and getting preventive care are important in promoting health and wellness. Ask your health care provider about: The right schedule for you to have regular tests and exams. Things you can do on your own to prevent diseases and keep yourself healthy. What should I know about diet, weight, and exercise? Eat a healthy diet  Eat a diet that includes plenty of vegetables, fruits, low-fat dairy products, and lean protein. Do not eat a lot of foods that are high in solid fats, added sugars, or sodium. Maintain a healthy weight Body mass index (BMI) is a measurement that can be used to identify possible weight problems. It estimates body fat based on height and weight. Your health care provider can help determine your BMI and help you achieve or maintain a healthy weight. Get regular exercise Get regular exercise. This is one of the most important things you can do for your health. Most adults should: Exercise for at least 150 minutes each week. The exercise should increase your heart rate and make you sweat (moderate-intensity exercise). Do strengthening exercises at least twice a week. This is in addition to the moderate-intensity exercise. Spend less time sitting. Even light physical activity can be beneficial. Watch cholesterol and blood lipids Have your blood tested for lipids and cholesterol at 48 years of age, then have this test every 5 years. You may need to have your cholesterol levels checked more often if: Your lipid or cholesterol levels are high. You are older than 48 years of age. You are at high risk for heart disease. What should I know about cancer screening? Many types of cancers can be detected early and may often be prevented. Depending on your health history and family history, you may need to have cancer screening at various ages. This may include screening for: Colorectal cancer. Prostate cancer. Skin cancer. Lung  cancer. What should I know about heart disease, diabetes, and high blood pressure? Blood pressure and heart disease High blood pressure causes heart disease and increases the risk of stroke. This is more likely to develop in people who have high blood pressure readings or are overweight. Talk with your health care provider about your target blood pressure readings. Have your blood pressure checked: Every 3-5 years if you are 18-39 years of age. Every year if you are 40 years old or older. If you are between the ages of 65 and 75 and are a current or former smoker, ask your health care provider if you should have a one-time screening for abdominal aortic aneurysm (AAA). Diabetes Have regular diabetes screenings. This checks your fasting blood sugar level. Have the screening done: Once every three years after age 45 if you are at a normal weight and have a low risk for diabetes. More often and at a younger age if you are overweight or have a high risk for diabetes. What should I know about preventing infection? Hepatitis B If you have a higher risk for hepatitis B, you should be screened for this virus. Talk with your health care provider to find out if you are at risk for hepatitis B infection. Hepatitis C Blood testing is recommended for: Everyone born from 1945 through 1965. Anyone with known risk factors for hepatitis C. Sexually transmitted infections (STIs) You should be screened each year for STIs, including gonorrhea and chlamydia, if: You are sexually active and are younger than 48 years of age. You are older than 48 years of age and your   health care provider tells you that you are at risk for this type of infection. Your sexual activity has changed since you were last screened, and you are at increased risk for chlamydia or gonorrhea. Ask your health care provider if you are at risk. Ask your health care provider about whether you are at high risk for HIV. Your health care provider  may recommend a prescription medicine to help prevent HIV infection. If you choose to take medicine to prevent HIV, you should first get tested for HIV. You should then be tested every 3 months for as long as you are taking the medicine. Follow these instructions at home: Alcohol use Do not drink alcohol if your health care provider tells you not to drink. If you drink alcohol: Limit how much you have to 0-2 drinks a day. Know how much alcohol is in your drink. In the [place] one drink equals one 12 oz bottle of beer (355 mL), one 5 oz glass of wine (148 mL), or one 1 oz glass of hard liquor (44 mL). Lifestyle Do not use any products that contain nicotine or tobacco. These products include cigarettes, chewing tobacco, and vaping devices, such as e-cigarettes. If you need help quitting, ask your health care provider. Do not use street drugs. Do not share needles. Ask your health care provider for help if you need support or information about quitting drugs. General instructions Schedule regular health, dental, and eye exams. Stay current with your vaccines. Tell your health care provider if: You often feel depressed. You have ever been abused or do not feel safe at home. Summary Adopting a healthy lifestyle and getting preventive care are important in promoting health and wellness. Follow your health care provider's instructions about healthy diet, exercising, and getting tested or screened for diseases. Follow your health care provider's instructions on monitoring your cholesterol and blood pressure. This information is not intended to replace advice given to you by your health care provider. Make sure you discuss any questions you have with your health care provider. Document Revised: 02/04/2021 Document Reviewed: 02/04/2021 Elsevier Patient Education  2023 [employer]  

## 2022-02-21 LAB — BMP8+EGFR
BUN/Creatinine Ratio: 11 (ref 9–20)
BUN: 12 mg/dL (ref 6–24)
CO2: 26 mmol/L (ref 20–29)
Calcium: 9.5 mg/dL (ref 8.7–10.2)
Chloride: 99 mmol/L (ref 96–106)
Creatinine, Ser: 1.12 mg/dL (ref 0.76–1.27)
Glucose: 106 mg/dL — ABNORMAL HIGH (ref 70–99)
Potassium: 3.7 mmol/L (ref 3.5–5.2)
Sodium: 141 mmol/L (ref 134–144)
eGFR: 81 mL/min/{1.73_m2} (ref >59)

## 2022-02-21 LAB — LIPID PANEL
Chol/HDL Ratio: 3.4 ratio (ref 0.0–5.0)
Cholesterol, Total: 122 mg/dL (ref 100–199)
HDL: 36 mg/dL — ABNORMAL LOW (ref >39)
LDL Chol Calc (NIH): 59 mg/dL (ref 0–99)
Triglycerides: 155 mg/dL — ABNORMAL HIGH (ref 0–149)
VLDL Cholesterol Cal: 27 mg/dL (ref 5–40)

## 2022-02-21 LAB — HEMOGLOBIN A1C
Est. average glucose Bld gHb Est-mCnc: 80 mg/dL
Hgb A1c MFr Bld: 4.4 % — ABNORMAL LOW (ref 4.8–5.6)

## 2022-02-21 LAB — MICROALBUMIN / CREATININE URINE RATIO
Creatinine, Urine: 118.8 mg/dL
Microalb/Creat Ratio: 90 mg/g creat — ABNORMAL HIGH (ref 0–29)
Microalbumin, Urine: 107.2 ug/mL

## 2022-02-21 LAB — PSA: Prostate Specific Ag, Serum: 1.1 ng/mL (ref 0.0–4.0)

## 2022-02-22 LAB — SPECIMEN STATUS REPORT

## 2022-02-22 LAB — FRUCTOSAMINE: Fructosamine: 235 umol/L (ref 0–285)

## 2022-02-25 ENCOUNTER — Encounter: Payer: Medicare Other | Admitting: Internal Medicine

## 2022-02-26 ENCOUNTER — Encounter: Payer: Self-pay | Admitting: Internal Medicine

## 2022-02-26 ENCOUNTER — Ambulatory Visit (INDEPENDENT_AMBULATORY_CARE_PROVIDER_SITE_OTHER): Payer: Medicare Other

## 2022-02-26 ENCOUNTER — Ambulatory Visit: Payer: Medicare Other | Admitting: Internal Medicine

## 2022-02-26 VITALS — BP 130/78 | HR 84 | Temp 98.3°F | Ht 66.8 in | Wt 227.0 lb

## 2022-02-26 DIAGNOSIS — Z0000 Encounter for general adult medical examination without abnormal findings: Secondary | ICD-10-CM | POA: Diagnosis not present

## 2022-02-26 NOTE — Progress Notes (Signed)
Subjective:   Nicolas [name] is a 46 y.o. male who presents for Medicare Annual/Subsequent preventive examination.  Review of Systems     Cardiac Risk Factors include: diabetes mellitus;hypertension;male gender;obesity (BMI >30kg/m2)     Objective:    Today's Vitals   02/26/22 1436 02/26/22 1441  BP: 130/78   Pulse: 84   Temp: 98.3 F (36.8 C)   TempSrc: Oral   SpO2: 96%   Weight: 227 lb (103 kg)   Height: 5' 6.8" (1.697 m)   PainSc:  6    Body mass index is 35.77 kg/m.     02/26/2022    2:44 PM 02/20/2021    3:00 PM 02/08/2020   11:50 AM 02/01/2019   12:07 PM 06/25/2017    1:59 PM 09/23/2016   12:00 AM 09/22/2016    3:09 AM  Advanced Directives  Does Patient Have a Medical Advance Directive? Yes No No No No No No  Type of Advance Directive Living will        Would patient like information on creating a medical advance directive?   No - Patient declined   No - Patient declined     Current Medications (verified) Outpatient Encounter Medications as of 02/26/2022  Medication Sig   amLODipine (NORVASC) 10 MG tablet TAKE 1 TABLET BY MOUTH EVERY DAY   carvedilol (COREG) 6.25 MG tablet TAKE 1 TABLET BY MOUTH TWICE A DAY WITH MEALS   Cholecalciferol (VITAMIN D3) 2000 units capsule Take 2,000 Units by mouth daily.   cyclobenzaprine (FLEXERIL) 10 MG tablet Take 10 mg by mouth 3 (three) times daily as needed for muscle spasms.   diclofenac Sodium (VOLTAREN) 1 % GEL Apply 2 g topically 3 (three) times daily as needed.   ferrous sulfate 325 (65 FE) MG EC tablet Take by mouth.   folic acid (FOLVITE) 1 MG tablet Take 1 mg by mouth daily.   glucose blood test strip Use as instructed (Patient taking differently: 1 each by Other route every other day. Use as instructed)   Lancets (ACCU-CHEK MULTICLIX) lancets Use as instructed (Patient taking differently: 1 each by Other route every other day.)   lisinopril (ZESTRIL) 10 MG tablet TAKE 2 TABLETS BY MOUTH EVERY DAY   metFORMIN  (GLUCOPHAGE) 500 MG tablet TAKE 1 TABLET BY MOUTH TWICE A DAY   Multiple Vitamin (ONE-A-DAY MENS PO) Take by mouth. 1 daily   OXBRYTA 500 MG TABS tablet Take by mouth.   oxyCODONE-acetaminophen (PERCOCET) 7.5-325 MG tablet Take 1 tablet by mouth every 4 (four) hours as needed for severe pain.   OZEMPIC, 0.25 OR 0.5 MG/DOSE, 2 MG/1.5ML SOPN INJECT 0.5 MG INTO THE SKIN ONCE A WEEK.   pravastatin (PRAVACHOL) 20 MG tablet TAKE 1 TABLET BY MOUTH EVERY DAY IN THE EVENING   triamterene-hydrochlorothiazide (MAXZIDE-25) 37.5-25 MG tablet TAKE 1 TABLET BY MOUTH EVERY DAY   No facility-administered encounter medications on file as of 02/26/2022.    Allergies (verified) Patient has no known allergies.   History: Past Medical History:  Diagnosis Date   Arthritis    arthritis- back & L hip   Depression    situational depression, pt. out of work    Diabetes mellitus (HCC)    HTN (hypertension) 05/10/2015   Osteoarthritis of left hip 06/07/2012   Sickle cell anemia [facility])    Past Surgical History:  Procedure Laterality Date   IR GENERIC HISTORICAL  09/26/2016   IR FLUORO GUIDE CV LINE RIGHT 09/26/2016 Irish Lack, MD [facility]  IR GENERIC HISTORICAL  09/26/2016   IR US GUIDE VASC ACCESS RIGHT 09/26/2016 Irish LackGlenn Yamagata, MD [facility]   JOINT REPLACEMENT     R hip   TOTAL HIP ARTHROPLASTY  06/07/2012   Procedure: TOTAL HIP ARTHROPLASTY;  Surgeon: Eulas PostJoshua P Landau, MD;  Location: [facility] OR;  Service: Orthopedics;  Laterality: Left;   Family History  Problem Relation Age of Onset   Cancer - Other Father    Liver cancer Father    Diabetes Mother    CAD Neg Hx    Social History   Socioeconomic History   Marital status: Married    Spouse name: Not on file   Number of children: 2   Years of education: Not on file   Highest education level: Not on file  Occupational History   Occupation: Disabled  Tobacco Use   Smoking status: Never   Smokeless tobacco: Never  Vaping Use   Vaping Use:  Never used  Substance and Sexual Activity   Alcohol use: Not Currently    Comment: ocassional    Drug use: Yes    Types: Oxycodone   Sexual activity: Yes  Other Topics Concern   Not on file  Social History Narrative   Not on file   Social Determinants of Health   Financial Resource Strain: Low Risk    Difficulty of Paying Living Expenses: Not hard at all  Food Insecurity: No Food Insecurity   Worried About Programme researcher, broadcasting/film/videounning Out of Food in the Last Year: Never true   Ran Out of Food in the Last Year: Never true  Transportation Needs: No Transportation Needs   Lack of Transportation (Medical): No   Lack of Transportation (Non-Medical): No  Physical Activity: Sufficiently Active   Days of Exercise per Week: 4 days   Minutes of Exercise per Session: 60 min  Stress: No Stress Concern Present   Feeling of Stress : Not at all  Social Connections: Not on file    Tobacco Counseling Counseling given: Not Answered   Clinical Intake:  Pre-visit preparation completed: Yes  Pain : 0-10 Pain Score: 6  Pain Type: Chronic pain Pain Location: Back Pain Orientation: Lower Pain Descriptors / Indicators: Aching, Sharp Pain Onset: More than a month ago Pain Frequency: Constant     Nutritional Status: BMI > 30  Obese Nutritional Risks: None Diabetes: Yes  How often do you need to have someone help you when you read instructions, pamphlets, or other written materials from your doctor or pharmacy?: 1 - Never  Diabetic? Yes Nutrition Risk Assessment:  Has the patient had any N/V/D within the last 2 months?  No  Does the patient have any non-healing wounds?  No  Has the patient had any unintentional weight loss or weight gain?  No   Diabetes:  Is the patient diabetic?  Yes  If diabetic, was a CBG obtained today?  No  Did the patient bring in their glucometer from home?  No  How often do you monitor your CBG's? Twice weekly.   Financial Strains and Diabetes Management:  Are you  having any financial strains with the device, your supplies or your medication? No .  Does the patient want to be seen by Chronic Care Management for management of their diabetes?  No  Would the patient like to be referred to a Nutritionist or for Diabetic Management?  No   Diabetic Exams:  Diabetic Eye Exam: Completed 01/24/2022 Diabetic Foot Exam: Overdue, Pt has been advised about the importance  in completing this exam. Pt is scheduled for diabetic foot exam on next appointment.   Interpreter Needed?: No  Information entered by :: NAllen LPN   Activities of Daily Living    02/26/2022    2:46 PM  In your present state of health, do you have any difficulty performing the following activities:  Hearing? 0  Vision? 1  Comment floaters sometimes  Difficulty concentrating or making decisions? 0  Walking or climbing stairs? 0  Dressing or bathing? 0  Doing errands, shopping? 0  Preparing Food and eating ? N  Using the Toilet? N  In the past six months, have you accidently leaked urine? N  Do you have problems with loss of bowel control? N  Managing your Medications? N  Managing your Finances? N  Housekeeping or managing your Housekeeping? N    Patient Care Team: Dorothyann Peng, MD as PCP - General (Internal Medicine) Aura Camps, MD as Consulting Physician (Ophthalmology)  Indicate any recent Medical Services you may have received from other than [facility] providers in the past year (date may be approximate).     Assessment:   This is a routine wellness examination for [facility].  Hearing/Vision screen Vision Screening - Comments:: Regular eye exams, Dr. Aura Camps  Dietary issues and exercise activities discussed: Current Exercise Habits: Home exercise routine, Type of exercise: walking;treadmill, Time (Minutes): 60, Frequency (Times/Week): 4, Weekly Exercise (Minutes/Week): 240   Goals Addressed             This Visit's Progress    Patient Stated        02/26/2022, no goals       Depression Screen    02/26/2022    2:45 PM 02/22/2022    3:17 PM 02/20/2021    3:01 PM 02/13/2021   10:09 AM 02/08/2020   11:51 AM 02/08/2020   11:08 AM 02/07/2019    9:15 AM  PHQ 2/9 Scores  PHQ - 2 Score 0 0 0 0 0 0 0  PHQ- 9 Score     0      Fall Risk    02/26/2022    2:45 PM 02/20/2021    3:01 PM 02/08/2020   11:51 AM 05/25/2019    4:06 PM 02/07/2019    9:15 AM  Fall Risk   Falls in the past year? 0 0 0 0 0  Number falls in past yr: 0      Injury with Fall? 0      Risk for fall due to : Medication side effect Medication side effect Medication side effect    Follow up Falls evaluation completed;Education provided;Falls prevention discussed Falls evaluation completed;Education provided;Falls prevention discussed Falls evaluation completed;Education provided;Falls prevention discussed      FALL RISK PREVENTION PERTAINING TO THE HOME:  Any stairs in or around the home? No  If so, are there any without handrails?  N/a Home free of loose throw rugs in walkways, pet beds, electrical cords, etc? Yes  Adequate lighting in your home to reduce risk of falls? Yes   ASSISTIVE DEVICES UTILIZED TO PREVENT FALLS:  Life alert? No  Use of a cane, walker or w/c? No  Grab bars in the bathroom? No  Shower chair or bench in shower? Yes  Elevated toilet seat or a handicapped toilet? No   TIMED UP AND GO:  Was the test performed? No .    Gait steady and fast without use of assistive device  Cognitive Function:  02/26/2022    2:47 PM 02/20/2021    3:03 PM 02/08/2020   11:52 AM 02/01/2019   12:12 PM  6CIT Screen  What Year? 0 points 0 points 0 points 0 points  What month? 0 points 0 points 0 points 0 points  What time? 0 points 0 points 0 points 0 points  Count back from 20 0 points 0 points 0 points 0 points  Months in reverse 0 points 0 points 0 points 0 points  Repeat phrase 4 points 4 points 2 points 0 points  Total Score 4 points 4 points 2  points 0 points    Immunizations Immunization History  Administered Date(s) Administered   Hepatitis B, adult 08/20/2015, 09/17/2015, 02/18/2016   HiB (PRP-T) 02/08/2015   Meningococcal B Recombinant 08/06/2020, 09/12/2020   Meningococcal Conjugate 11/09/2014, 02/20/2020, 02/20/2020   PFIZER(Purple Top)SARS-COV-2 Vaccination 01/07/2020, 01/30/2020, 10/16/2020   Pneumococcal Conjugate-13 11/09/2014   Pneumococcal Polysaccharide-23 02/08/2015, 02/20/2020    TDAP status: Up to date  Flu Vaccine status: Declined, Education has been provided regarding the importance of this vaccine but patient still declined. Advised may receive this vaccine at local pharmacy or Health Dept. Aware to provide a copy of the vaccination record if obtained from local pharmacy or Health Dept. Verbalized acceptance and understanding.  Pneumococcal vaccine status: Up to date  Covid-19 vaccine status: Completed vaccines  Qualifies for Shingles Vaccine? No   Zostavax completed No   Shingrix Completed?: n/a  Screening Tests Health Maintenance  Topic Date Due   FOOT EXAM  02/13/2022   COVID-19 Vaccine (4 - Booster for Pfizer series) 03/14/2022 (Originally 12/11/2020)   INFLUENZA VACCINE  04/29/2022   HEMOGLOBIN A1C  08/23/2022   OPHTHALMOLOGY EXAM  01/25/2023   TETANUS/TDAP  06/08/2024   COLONOSCOPY (Pts 45-57yrs Insurance coverage will need to be confirmed)  01/22/2032   Hepatitis C Screening  Completed   HIV Screening  Completed   HPV VACCINES  Aged Out    Health Maintenance  Health Maintenance Due  Topic Date Due   FOOT EXAM  02/13/2022    Colorectal cancer screening: Type of screening: Colonoscopy. Completed 01/21/2022. Repeat every 10 years  Lung Cancer Screening: (Low Dose CT Chest recommended if Age 82-80 years, 30 pack-year currently smoking OR have quit w/in 15years.) does not qualify.   Lung Cancer Screening Referral: no  Additional Screening:  Hepatitis C Screening: does qualify;  Completed 02/06/2021  Vision Screening: Recommended annual ophthalmology exams for early detection of glaucoma and other disorders of the eye. Is the patient up to date with their annual eye exam?  Yes  Who is the provider or what is the name of the office in which the patient attends annual eye exams? Dr. Karleen Hampshire If pt is not established with a provider, would they like to be referred to a provider to establish care? No .   Dental Screening: Recommended annual dental exams for proper oral hygiene  Community Resource Referral / Chronic Care Management: CRR required this visit?  No   CCM required this visit?  No      Plan:     I have personally reviewed and noted the following in the patient's chart:   Medical and social history Use of alcohol, tobacco or illicit drugs  Current medications and supplements including opioid prescriptions. Patient is currently taking opioid prescriptions. Information provided to patient regarding non-opioid alternatives. Patient advised to discuss non-opioid treatment plan with their provider. Functional ability and status Nutritional status Physical activity Advanced directives  List of other physicians Hospitalizations, surgeries, and ER visits in previous 12 months Vitals Screenings to include cognitive, depression, and falls Referrals and appointments  In addition, I have reviewed and discussed with patient certain preventive protocols, quality metrics, and best practice recommendations. A written personalized care plan for preventive services as well as general preventive health recommendations were provided to patient.     Barb Merino, LPN   1/61/0960   Nurse Notes: none

## 2022-02-26 NOTE — Patient Instructions (Signed)
Nicolas [name] , Thank you for taking time to come for your Medicare Wellness Visit. I appreciate your ongoing commitment to your health goals. Please review the following plan we discussed and let me know if I can assist you in the future.   Screening recommendations/referrals: Colonoscopy: completed 01/21/2022 Recommended yearly ophthalmology/optometry visit for glaucoma screening and checkup Recommended yearly dental visit for hygiene and checkup  Vaccinations: Influenza vaccine: due 04/29/2022 Pneumococcal vaccine: n/a Tdap vaccine: completed 06/08/2014, due 06/08/2024 Shingles vaccine: n/a   Covid-19:  10/16/2020, 01/30/2020, 01/07/2020  Advanced directives: Please bring a copy of your POA (Power of Attorney) and/or Living Will to your next appointment.    Conditions/risks identified: none  Next appointment: Follow up in one year for your annual wellness visit   Preventive Care 40-64 Years, Male Preventive care refers to lifestyle choices and visits with your health care provider that can promote health and wellness. What does preventive care include? A yearly physical exam. This is also called an annual well check. Dental exams once or twice a year. Routine eye exams. Ask your health care provider how often you should have your eyes checked. Personal lifestyle choices, including: Daily care of your teeth and gums. Regular physical activity. Eating a healthy diet. Avoiding tobacco and drug use. Limiting alcohol use. Practicing safe sex. Taking low-dose aspirin every day starting at age 69. What happens during an annual well check? The services and screenings done by your health care provider during your annual well check will depend on your age, overall health, lifestyle risk factors, and family history of disease. Counseling  Your health care provider may ask you questions about your: Alcohol use. Tobacco use. Drug use. Emotional well-being. Home and relationship  well-being. Sexual activity. Eating habits. Work and work Astronomer. Screening  You may have the following tests or measurements: Height, weight, and BMI. Blood pressure. Lipid and cholesterol levels. These may be checked every 5 years, or more frequently if you are over 67 years old. Skin check. Lung cancer screening. You may have this screening every year starting at age 26 if you have a 30-pack-year history of smoking and currently smoke or have quit within the past 15 years. Fecal occult blood test (FOBT) of the stool. You may have this test every year starting at age 46. Flexible sigmoidoscopy or colonoscopy. You may have a sigmoidoscopy every 5 years or a colonoscopy every 10 years starting at age 67. Prostate cancer screening. Recommendations will vary depending on your family history and other risks. Hepatitis C blood test. Hepatitis B blood test. Sexually transmitted disease (STD) testing. Diabetes screening. This is done by checking your blood sugar (glucose) after you have not eaten for a while (fasting). You may have this done every 1-3 years. Discuss your test results, treatment options, and if necessary, the need for more tests with your health care provider. Vaccines  Your health care provider may recommend certain vaccines, such as: Influenza vaccine. This is recommended every year. Tetanus, diphtheria, and acellular pertussis (Tdap, Td) vaccine. You may need a Td booster every 10 years. Zoster vaccine. You may need this after age 33. Pneumococcal 13-valent conjugate (PCV13) vaccine. You may need this if you have certain conditions and have not been vaccinated. Pneumococcal polysaccharide (PPSV23) vaccine. You may need one or two doses if you smoke cigarettes or if you have certain conditions. Talk to your health care provider about which screenings and vaccines you need and how often you need them. This information is not  intended to replace advice given to you by your  health care provider. Make sure you discuss any questions you have with your health care provider. Document Released: 10/12/2015 Document Revised: 06/04/2016 Document Reviewed: 07/17/2015 Elsevier Interactive Patient Education  2017 [place] Prevention in the Home Falls can cause injuries. They can happen to people of all ages. There are many things you can do to make your home safe and to help prevent falls. What can I do on the outside of my home? Regularly fix the edges of walkways and driveways and fix any cracks. Remove anything that might make you trip as you walk through a door, such as a raised step or threshold. Trim any bushes or trees on the path to your home. Use bright outdoor lighting. Clear any walking paths of anything that might make someone trip, such as rocks or tools. Regularly check to see if handrails are loose or broken. Make sure that both sides of any steps have handrails. Any raised decks and porches should have guardrails on the edges. Have any leaves, snow, or ice cleared regularly. Use sand or salt on walking paths during winter. Clean up any spills in your garage right away. This includes oil or grease spills. What can I do in the bathroom? Use night lights. Install grab bars by the toilet and in the tub and shower. Do not use towel bars as grab bars. Use non-skid mats or decals in the tub or shower. If you need to sit down in the shower, use a plastic, non-slip stool. Keep the floor dry. Clean up any water that spills on the floor as soon as it happens. Remove soap buildup in the tub or shower regularly. Attach bath mats securely with double-sided non-slip rug tape. Do not have throw rugs and other things on the floor that can make you trip. What can I do in the bedroom? Use night lights. Make sure that you have a light by your bed that is easy to reach. Do not use any sheets or blankets that are too big for your bed. They should not hang down  onto the floor. Have a firm chair that has side arms. You can use this for support while you get dressed. Do not have throw rugs and other things on the floor that can make you trip. What can I do in the kitchen? Clean up any spills right away. Avoid walking on wet floors. Keep items that you use a lot in easy-to-reach places. If you need to reach something above you, use a strong step stool that has a grab bar. Keep electrical cords out of the way. Do not use floor polish or wax that makes floors slippery. If you must use wax, use non-skid floor wax. Do not have throw rugs and other things on the floor that can make you trip. What can I do with my stairs? Do not leave any items on the stairs. Make sure that there are handrails on both sides of the stairs and use them. Fix handrails that are broken or loose. Make sure that handrails are as long as the stairways. Check any carpeting to make sure that it is firmly attached to the stairs. Fix any carpet that is loose or worn. Avoid having throw rugs at the top or bottom of the stairs. If you do have throw rugs, attach them to the floor with carpet tape. Make sure that you have a light switch at the top of the stairs  and the bottom of the stairs. If you do not have them, ask someone to add them for you. What else can I do to help prevent falls? Wear shoes that: Do not have high heels. Have rubber bottoms. Are comfortable and fit you well. Are closed at the toe. Do not wear sandals. If you use a stepladder: Make sure that it is fully opened. Do not climb a closed stepladder. Make sure that both sides of the stepladder are locked into place. Ask someone to hold it for you, if possible. Clearly mark and make sure that you can see: Any grab bars or handrails. First and last steps. Where the edge of each step is. Use tools that help you move around (mobility aids) if they are needed. These include: Canes. Walkers. Scooters. Crutches. Turn  on the lights when you go into a dark area. Replace any light bulbs as soon as they burn out. Set up your furniture so you have a clear path. Avoid moving your furniture around. If any of your floors are uneven, fix them. If there are any pets around you, be aware of where they are. Review your medicines with your doctor. Some medicines can make you feel dizzy. This can increase your chance of falling. Ask your doctor what other things that you can do to help prevent falls. This information is not intended to replace advice given to you by your health care provider. Make sure you discuss any questions you have with your health care provider. Document Released: 07/12/2009 Document Revised: 02/21/2016 Document Reviewed: 10/20/2014 Elsevier Interactive Patient Education  2017 [employer].

## 2022-03-10 ENCOUNTER — Encounter: Payer: Medicare Other | Admitting: Internal Medicine

## 2022-06-02 ENCOUNTER — Other Ambulatory Visit: Payer: Self-pay | Admitting: Internal Medicine

## 2022-06-04 ENCOUNTER — Encounter: Payer: Self-pay | Admitting: Nurse Practitioner

## 2022-06-04 ENCOUNTER — Ambulatory Visit (INDEPENDENT_AMBULATORY_CARE_PROVIDER_SITE_OTHER): Payer: Medicare Other | Admitting: Nurse Practitioner

## 2022-06-04 VITALS — BP 157/100 | HR 97 | Temp 97.5°F | Ht 67.0 in | Wt 228.6 lb

## 2022-06-04 DIAGNOSIS — M2550 Pain in unspecified joint: Secondary | ICD-10-CM

## 2022-06-04 DIAGNOSIS — G8929 Other chronic pain: Secondary | ICD-10-CM

## 2022-06-04 DIAGNOSIS — E559 Vitamin D deficiency, unspecified: Secondary | ICD-10-CM | POA: Diagnosis not present

## 2022-06-04 DIAGNOSIS — Z0000 Encounter for general adult medical examination without abnormal findings: Secondary | ICD-10-CM

## 2022-06-04 MED ORDER — IBUPROFEN 800 MG PO TABS: 800.0000 mg | ORAL_TABLET | Freq: Three times a day (TID) | ORAL | 0 refills | Status: DC | PRN

## 2022-06-04 NOTE — Progress Notes (Signed)
@Patient  ID: , male    DOB: 1976-01-05, 46 y.o.   MRN: [id number]  Chief Complaint  Patient presents with   OFFICE VISIT    Pt states he came here because his PCP doctor told him he need a doctor to manage his Sickle Cell     Referring provider: [id number], MD   HPI  46 year old male with history of hypertension, diabetes, arthritis, obesity, vitamin D deficiency.  Patient states that he was referred by his PCP to this office for sickle cell management.  Patient states that he has been having pain in multiple joints recently.  Patient does have a history of chronic pain in the past and has been seen by pain clinic.  We will have patient return in 1 week to be scheduled with with [facility] for evaluation and sickle cell pain management.  We will check labs to rule out gout due to pain located in joints. Denies f/c/s, n/v/d, hemoptysis, PND, leg swelling Denies chest pain or edema       No Known Allergies  Immunization History  Administered Date(s) Administered   HIB (PRP-T) 02/08/2015   Hepatitis B, adult 08/20/2015, 09/17/2015, 02/18/2016   Meningococcal B Recombinant 08/06/2020, 09/12/2020   Meningococcal Conjugate 11/09/2014, 02/20/2020, 02/20/2020   PFIZER(Purple Top)SARS-COV-2 Vaccination 01/07/2020, 01/30/2020, 10/16/2020   Pneumococcal Conjugate-13 11/09/2014   Pneumococcal Polysaccharide-23 02/08/2015, 02/20/2020    Past Medical History:  Diagnosis Date   Arthritis    arthritis- back & L hip   Depression    situational depression, pt. out of work    Diabetes mellitus (HCC)    HTN (hypertension) 05/10/2015   Osteoarthritis of left hip 06/07/2012   Sickle cell anemia (HCC)     Tobacco History: Social History   Tobacco Use  Smoking Status Never  Smokeless Tobacco Never   Counseling given: Not Answered   Outpatient Encounter Medications as of 06/04/2022  Medication Sig   amLODipine (NORVASC) 10 MG tablet TAKE 1 TABLET BY MOUTH EVERY DAY    carvedilol (COREG) 6.25 MG tablet TAKE 1 TABLET BY MOUTH TWICE A DAY WITH MEALS   Cholecalciferol (VITAMIN D3) 2000 units capsule Take 2,000 Units by mouth daily.   cyclobenzaprine (FLEXERIL) 10 MG tablet Take 10 mg by mouth 3 (three) times daily as needed for muscle spasms.   diclofenac Sodium (VOLTAREN) 1 % GEL Apply 2 g topically 3 (three) times daily as needed.   ferrous sulfate 325 (65 FE) MG EC tablet Take by mouth.   folic acid (FOLVITE) 1 MG tablet Take 1 mg by mouth daily.   glucose blood test strip Use as instructed (Patient taking differently: 1 each by Other route every other day. Use as instructed)   ibuprofen (ADVIL) 800 MG tablet Take 1 tablet (800 mg total) by mouth every 8 (eight) hours as needed.   Lancets (ACCU-CHEK MULTICLIX) lancets Use as instructed (Patient taking differently: 1 each by Other route every other day.)   lisinopril (ZESTRIL) 10 MG tablet TAKE 2 TABLETS BY MOUTH EVERY DAY   metFORMIN (GLUCOPHAGE) 500 MG tablet TAKE 1 TABLET BY MOUTH TWICE A DAY   Multiple Vitamin (ONE-A-DAY MENS PO) Take by mouth. 1 daily   OXBRYTA 500 MG TABS tablet Take by mouth.   oxyCODONE-acetaminophen (PERCOCET) 7.5-325 MG tablet Take 1 tablet by mouth every 4 (four) hours as needed for severe pain.   OZEMPIC, 0.25 OR 0.5 MG/DOSE, 2 MG/1.5ML SOPN INJECT 0.5 MG INTO THE SKIN ONCE A WEEK.   pravastatin (  PRAVACHOL) 20 MG tablet TAKE 1 TABLET BY MOUTH EVERY DAY IN THE EVENING   triamterene-hydrochlorothiazide (MAXZIDE-25) 37.5-25 MG tablet TAKE 1 TABLET BY MOUTH EVERY DAY   No facility-administered encounter medications on file as of 06/04/2022.     Review of Systems  Review of Systems  Constitutional: Negative.   HENT: Negative.    Cardiovascular: Negative.   Gastrointestinal: Negative.   Musculoskeletal:  Positive for arthralgias and myalgias.  Allergic/Immunologic: Negative.   Neurological: Negative.   Psychiatric/Behavioral: Negative.         Physical Exam  BP (!) 157/100  (BP Location: Right Arm, Patient Position: Sitting, Cuff Size: Large)   Pulse 97   Temp (!) 97.5 F (36.4 C)   Ht 5\' 7"  (1.702 m)   Wt 228 lb 9.6 oz (103.7 kg)   SpO2 99%   BMI 35.80 kg/m   Wt Readings from Last 5 Encounters:  06/04/22 228 lb 9.6 oz (103.7 kg)  02/26/22 227 lb (103 kg)  02/20/22 223 lb 9.6 oz (101.4 kg)  10/23/21 232 lb 3.2 oz (105.3 kg)  06/19/21 237 lb (107.5 kg)     Physical Exam Vitals and nursing note reviewed.  Constitutional:      General: He is not in acute distress.    Appearance: He is well-developed.  Cardiovascular:     Rate and Rhythm: Normal rate and regular rhythm.  Pulmonary:     Effort: Pulmonary effort is normal.     Breath sounds: Normal breath sounds.  Skin:    General: Skin is warm and dry.  Neurological:     Mental Status: He is alert and oriented to person, place, and time.      Lab Results:  CBC    Component Value Date/Time   WBC 9.5 06/04/2022 1458   WBC 14.7 (H) 09/29/2016 1335   RBC 4.12 (L) 06/04/2022 1458   RBC 3.01 (L) 09/29/2016 1335   RBC 3.01 (L) 09/29/2016 1335   HGB 12.2 (L) 06/04/2022 1458   HCT 36.7 (L) 06/04/2022 1458   PLT 448 06/04/2022 1458   MCV 89 06/04/2022 1458   MCH 29.6 06/04/2022 1458   MCH 27.6 09/29/2016 1335   MCHC 33.2 06/04/2022 1458   MCHC 33.6 09/29/2016 1335   RDW 17.7 (H) 06/04/2022 1458   LYMPHSABS 2.6 06/04/2022 1458   MONOABS 0.7 09/28/2016 0715   EOSABS 0.2 06/04/2022 1458   BASOSABS 0.1 06/04/2022 1458    BMET    Component Value Date/Time   NA 144 06/04/2022 1458   K 3.5 06/04/2022 1458   CL 100 06/04/2022 1458   CO2 25 06/04/2022 1458   GLUCOSE 162 (H) 06/04/2022 1458   GLUCOSE 105 (H) 09/29/2016 1335   BUN 15 06/04/2022 1458   CREATININE 1.23 06/04/2022 1458   CALCIUM 9.8 06/04/2022 1458   GFRNONAA 69 11/13/2020 1436   GFRAA 80 11/13/2020 1436     Assessment & Plan:   Health care maintenance - POCT URINALYSIS DIP (CLINITEK)  2. Arthralgia, unspecified  joint  - Uric Acid - Sedimentation Rate  3. Other chronic pain  - Hgb Fractionation Cascade - Sickle Cell Panel - ibuprofen (ADVIL) 800 MG tablet; Take 1 tablet (800 mg total) by mouth every 8 (eight) hours as needed.  Dispense: 30 tablet; Refill: 0  Follow up:  Follow up in 1 week     11/15/2020, NP 06/05/2022

## 2022-06-05 ENCOUNTER — Telehealth: Payer: Self-pay | Admitting: Nurse Practitioner

## 2022-06-05 ENCOUNTER — Encounter: Payer: Self-pay | Admitting: Nurse Practitioner

## 2022-06-05 ENCOUNTER — Other Ambulatory Visit: Payer: Self-pay | Admitting: Nurse Practitioner

## 2022-06-05 DIAGNOSIS — Z0000 Encounter for general adult medical examination without abnormal findings: Secondary | ICD-10-CM | POA: Insufficient documentation

## 2022-06-05 MED ORDER — PREDNISONE 20 MG PO TABS: 20.0000 mg | ORAL_TABLET | Freq: Every day | ORAL | 0 refills | Status: DC

## 2022-06-05 NOTE — Patient Instructions (Signed)
1. Health care maintenance  - POCT URINALYSIS DIP (CLINITEK)  2. Arthralgia, unspecified joint  - Uric Acid - Sedimentation Rate  3. Other chronic pain  - Hgb Fractionation Cascade - Sickle Cell Panel - ibuprofen (ADVIL) 800 MG tablet; Take 1 tablet (800 mg total) by mouth every 8 (eight) hours as needed.  Dispense: 30 tablet; Refill: 0  Follow up:  Follow up in 1 week

## 2022-06-05 NOTE — Telephone Encounter (Signed)
Advised patient that uric acid and se rates are elevated -pain was noted to be mainly in joints. Will treat for possible gout with a short course of prednisone.

## 2022-06-05 NOTE — Assessment & Plan Note (Signed)
-   POCT URINALYSIS DIP (CLINITEK)  2. Arthralgia, unspecified joint  - Uric Acid - Sedimentation Rate  3. Other chronic pain  - Hgb Fractionation Cascade - Sickle Cell Panel - ibuprofen (ADVIL) 800 MG tablet; Take 1 tablet (800 mg total) by mouth every 8 (eight) hours as needed.  Dispense: 30 tablet; Refill: 0  Follow up:  Follow up in 1 week

## 2022-06-07 LAB — CMP14+CBC/D/PLT+FER+RETIC+V...
ALT: 24 IU/L (ref 0–44)
AST: 30 IU/L (ref 0–40)
Albumin/Globulin Ratio: 1.7 (ref 1.2–2.2)
Albumin: 4.7 g/dL (ref 4.1–5.1)
Alkaline Phosphatase: 71 IU/L (ref 44–121)
BUN/Creatinine Ratio: 12 (ref 9–20)
BUN: 15 mg/dL (ref 6–24)
Basophils Absolute: 0.1 10*3/uL (ref 0.0–0.2)
Basos: 1 %
Bilirubin Total: 0.5 mg/dL (ref 0.0–1.2)
CO2: 25 mmol/L (ref 20–29)
Calcium: 9.8 mg/dL (ref 8.7–10.2)
Chloride: 100 mmol/L (ref 96–106)
Creatinine, Ser: 1.23 mg/dL (ref 0.76–1.27)
EOS (ABSOLUTE): 0.2 10*3/uL (ref 0.0–0.4)
Eos: 2 %
Ferritin: 102 ng/mL (ref 30–400)
Globulin, Total: 2.8 g/dL (ref 1.5–4.5)
Glucose: 162 mg/dL — ABNORMAL HIGH (ref 70–99)
Hematocrit: 36.7 % — ABNORMAL LOW (ref 37.5–51.0)
Hemoglobin: 12.2 g/dL — ABNORMAL LOW (ref 13.0–17.7)
Immature Grans (Abs): 0 10*3/uL (ref 0.0–0.1)
Immature Granulocytes: 0 %
Lymphocytes Absolute: 2.6 10*3/uL (ref 0.7–3.1)
Lymphs: 28 %
MCH: 29.6 pg (ref 26.6–33.0)
MCHC: 33.2 g/dL (ref 31.5–35.7)
MCV: 89 fL (ref 79–97)
Monocytes Absolute: 0.8 10*3/uL (ref 0.1–0.9)
Monocytes: 9 %
NRBC: 1 % — ABNORMAL HIGH (ref 0–0)
Neutrophils Absolute: 5.7 10*3/uL (ref 1.4–7.0)
Neutrophils: 60 %
Platelets: 448 10*3/uL (ref 150–450)
Potassium: 3.5 mmol/L (ref 3.5–5.2)
RBC: 4.12 x10E6/uL — ABNORMAL LOW (ref 4.14–5.80)
RDW: 17.7 % — ABNORMAL HIGH (ref 11.6–15.4)
Retic Ct Pct: 3.7 % — ABNORMAL HIGH (ref 0.6–2.6)
Sodium: 144 mmol/L (ref 134–144)
Total Protein: 7.5 g/dL (ref 6.0–8.5)
Vit D, 25-Hydroxy: 57.6 ng/mL (ref 30.0–100.0)
WBC: 9.5 10*3/uL (ref 3.4–10.8)
eGFR: 72 mL/min/{1.73_m2} (ref >59)

## 2022-06-07 LAB — SEDIMENTATION RATE: Sed Rate: 17 mm/hr — ABNORMAL HIGH (ref 0–15)

## 2022-06-07 LAB — HGB FRAC BY HPLC+SOLUBILITY
Hgb A2: 3.4 % — ABNORMAL HIGH (ref 1.8–3.2)
Hgb A: 0 % — ABNORMAL LOW (ref 96.4–98.8)
Hgb C: 45.7 % — ABNORMAL HIGH
Hgb E: 0 %
Hgb S: 50.3 % — ABNORMAL HIGH
Hgb Solubility: POSITIVE — AB
Hgb Variant: 0 %

## 2022-06-07 LAB — HGB FRACTIONATION CASCADE: Hgb F: 0.6 % (ref 0.0–2.0)

## 2022-06-07 LAB — URIC ACID: Uric Acid: 10.8 mg/dL — ABNORMAL HIGH (ref 3.8–8.4)

## 2022-06-10 ENCOUNTER — Ambulatory Visit (INDEPENDENT_AMBULATORY_CARE_PROVIDER_SITE_OTHER): Payer: Medicare Other | Admitting: Family Medicine

## 2022-06-10 ENCOUNTER — Encounter: Payer: Self-pay | Admitting: Family Medicine

## 2022-06-10 VITALS — BP 155/108 | HR 87 | Temp 97.2°F | Ht 66.6 in | Wt 232.0 lb

## 2022-06-10 DIAGNOSIS — D571 Sickle-cell disease without crisis: Secondary | ICD-10-CM | POA: Diagnosis not present

## 2022-06-10 DIAGNOSIS — G8929 Other chronic pain: Secondary | ICD-10-CM | POA: Diagnosis not present

## 2022-06-10 MED ORDER — OXYCODONE-ACETAMINOPHEN 7.5-325 MG PO TABS: 1.0000 | ORAL_TABLET | ORAL | 0 refills | Status: DC | PRN

## 2022-06-10 MED ORDER — KETOROLAC TROMETHAMINE 60 MG/2ML IM SOLN
60.0000 mg | Freq: Once | INTRAMUSCULAR | Status: AC
  Administered 2022-06-10: 60 mg via INTRAMUSCULAR

## 2022-06-10 MED ORDER — FOLIC ACID 1 MG PO TABS: 1.0000 mg | ORAL_TABLET | Freq: Every day | ORAL | 11 refills | Status: AC

## 2022-06-10 NOTE — Patient Instructions (Signed)
Living With Sickle Cell Disease Living with a long-term condition, such as sickle cell disease, can be a challenge. It can affect both your physical and mental health. You may not have total control over your condition. But proper care and treatment can help manage the effects of the disease so you can feel good and lead an active life. You can take steps to manage your condition and stay as healthy as possible. How does sickle cell disease affect me? Sickle cell disease can cause challenges that affect your quality of life. You may get sick more often as a result of organ damage and infections. Sometimes you may need to stay in the hospital. Learn how to recognize that you are not feeling well and that you may be getting sick. What actions can I take to manage my condition?  The goals of treatment are to control your symptoms and prevent and treat problems. Work with your health care provider to create a treatment plan that works for you. Taking an active role in managing your condition can help you feel more in control of your situation. Ask about possible side effects of medicines that your health care provider recommends. Discuss how you feel about having those side effects. Keeping a healthy lifestyle can help you manage your condition. This includes eating a healthy diet, getting enough sleep, and getting regular exercise. Sickle cell disease may affect your ability to take care of your basic needs. Tell your health care provider if you have concerns about any of these needs: Access to food. Housing. Safe drinking water and other utilities. Safety in your home and community. Work or school. Transportation. Paying for health care. Your health care provider may be able to connect you with community resources that can help you. How to manage stress  Living with sickle cell disease can be stressful. This disease can have a big impact on your mental health. Talk with your health care provider  about ways to reduce your stress or if you have concerns about your mental health.  To cope with stress, try: Keeping a stress diary. This can help you learn what causes your stress to start (figure out your triggers) and how to control your response to those triggers. Spending time doing things that you enjoy, such as: Hobbies. Being outdoors. Spending time with friends and people who make you laugh. Doing yoga, muscle relaxation, deep breathing, or mindfulness practices. Expressing yourself through journal writing, art, crafting, poetry, or playing music. Staying positive about your health. Try to accept that you cannot control your condition perfectly. Follow these instructions at home: Medicines Take over-the-counter and prescription medicines only as told by your health care provider. If you were prescribed antibiotics, take them as told by your health care provider. Do not stop taking them even if you start to feel better. If you develop a fever, do not take medicines to reduce the fever right away. This could cover up another problem. Contact your health care provider. Eating and drinking Drink enough fluid to keep your urine pale yellow. Drink more in hot weather and during exercise. Limit or avoid drinking alcohol. Eat a balanced and nutritious diet. Eat plenty of fruits, vegetables, whole grains, and lean protein. Take vitamins and supplements as told by your health care provider. Traveling When traveling, keep these with you: Your medical information. The names of your health care providers. Your medicines. If you have to travel by air, ask about precautions you should take. Managing pain Work with   your health care provider to create a pain management plan that works for you. The plan may include: Ways to reduce or manage your pain at home, such as: Using a heating pad. Taking a warm bath. Using healthy ways to distract you from the pain, such as hobbies or  reading. Practicing ways to relax, such as doing yoga or listening to music. Getting massages. Doing exercises or stretches as told by a physical therapist. Tracking how pain affects your daily life functions. When to seek help. Who to contact and what to do in case of a pain emergency. General instructions Do not use any products that contain nicotine or tobacco. These products include cigarettes, chewing tobacco, and vaping devices, such as e-cigarettes. These lower blood oxygen levels. If you need help quitting, ask your health care provider. Consider wearing a medical alert bracelet. Use an app or journal to track your symptoms, assess your level of pain and fatigue, and keep track of your medicines. Avoid the following: High altitudes. Very high or low temperatures and big changes in temperature. Activities that will lower your oxygen levels, such as mountain climbing or doing exercise that takes a lot of effort. Stay up to date on: Your treatment plan. Learn as much as you can about your condition. Health screenings. This will help prevent problems or catch them early on. Vaccines. This will help prevent infection. Wash your hands often with soap and water to help prevent infections. Wash them for at least 20 seconds each time. Keep all follow-up visits. Regular follow-up with your health care provider can help you better manage your condition. Where to find support You can find help and support through: Talking with a therapist or taking part in support groups. Sickle Cell [employer] [place]: www.sicklecelldisease.org Where to find more information Centers for Disease Control and Prevention: www.cdc.gov American Society of Hematology: www.hematology.org Contact a health care provider if: Your symptoms get worse. You have new symptoms. You have a fever. Get help right away if: You have a painful erection of the penis that lasts a long time (priapism). You become  short of breath or are having trouble breathing. You have pain that cannot be controlled with medicine. You have any signs of a stroke. "BE FAST" is an easy way to remember the main warning signs: B - Balance. Dizziness, sudden trouble walking, or loss of balance. E - Eyes. Trouble seeing or a change in how you see. F - Face. Sudden weakness or loss of feeling of the face. The face or eyelid may droop on one side. A - Arms. Weakness or loss of feeling in an arm. This happens all of a sudden and most often on one side of the body. S - Speech. Sudden trouble speaking, slurred speech, or trouble understanding what people say. T - Time. Time to call emergency services. Write down what time symptoms started. You have other signs of a stroke, such as: A sudden, very bad headache with no known cause. Feeling like you may vomit (nausea). Vomiting. Seizure. These symptoms may be an emergency. Get help right away. Call 911. Do not wait to see if the symptoms will go away. Do not drive yourself to the hospital. Also, get help right away if: You have strong feelings of sadness or loss of hope, or you have thoughts about hurting yourself or others. Take one of these steps if you feel like you may hurt yourself or others, or have thoughts about taking your own life:   Go to your nearest emergency room. Call 911. Call the National Suicide Prevention Lifeline at 1-800-273-8255 or 988. This is open 24 hours a day. Text the Crisis Text Line at 741741. Summary Proper care and treatment can help manage the effects of sickle cell disease so you can feel good and lead an active life. The goals of treatment are to control your symptoms and prevent and treat problems. Taking an active role in managing your condition can help you feel more in control of your situation. Work with your health care provider to create a pain management plan that works for you. Get medical help right away as told by your health care  provider. This information is not intended to replace advice given to you by your health care provider. Make sure you discuss any questions you have with your health care provider. Document Revised: 12/23/2021 Document Reviewed: 12/23/2021 [employer] Patient Education  2023 [employer]  

## 2022-06-10 NOTE — Progress Notes (Signed)
Patient [name]   Established Patient Office Visit  Subjective   Patient ID: Nicolas [name], male    DOB: August 16, 1976  Age: 46 y.o. MRN: [id number]  Chief Complaint  Patient presents with   Follow-up    Pt is here for SCD pt states he is in pain, lower back, LT arm and both feet. Pt took ibuprofen but he still has pain.    HPI Nicolas [name] is a 46 year old male with a medical history significant for sickle cell disease, type 2 diabetes mellitus, hypertension, obesity, chronic pain syndrome, and vitamin D deficiency presents with complaints chronic pain related to his sickle cell disease.  Pain is primarily to lower back, left arm, and lower extremities.  Patient states that he has been having increased crises over the past several weeks and has not had any pain medications to control his crises at home.  He was previously followed by [facility] hematology for sickle cell disease.  Patient has a primary care provider who follows him for all other chronic conditions. Today, patient's pain intensity is 7/10.  Pain is characterized as constant and throbbing.  Taking any medications for this problem over the past several days.  He denies any fever, chills, chest pain, shortness of breath, urinary symptoms, nausea, vomiting, or diarrhea. 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 05/10/2015   Type 2 diabetes mellitus without complication, without  long-term current use of insulin ([place]) 05/10/2015   Hypokalemia 05/10/2015   Dehydration 05/10/2015   Leukocytosis 05/10/2015   Elevated LFTs 05/10/2015   Proteinuria 11/09/2014   Vitamin D deficiency 08/02/2014   Chronic pain 08/01/2014   Hypertensive heart disease 07/26/2014   Obesity 07/26/2014   Sickle cell disease, type [place] ([place]) 07/26/2014   Low back pain 11/03/2013   DKA, type 2 ([place]) 08/13/2013   Postoperative anemia due to acute blood loss 06/08/2012   Osteoarthritis of left hip 06/07/2012   Past Medical History:  Diagnosis Date   Arthritis    arthritis- back & L hip   Depression    situational depression, pt. out of work    Diabetes mellitus ([place])    HTN (hypertension) 05/10/2015   Osteoarthritis of left hip 06/07/2012   Sickle cell anemia ([place])    Past Surgical History:  Procedure Laterality Date   IR GENERIC HISTORICAL  09/26/2016   IR FLUORO GUIDE CV LINE RIGHT 09/26/2016 Aletta Edouard, MD [facility]   IR GENERIC HISTORICAL  09/26/2016   IR US GUIDE VASC ACCESS RIGHT 09/26/2016 Aletta Edouard, MD [facility]   JOINT REPLACEMENT     R hip   TOTAL HIP ARTHROPLASTY  06/07/2012   Procedure: TOTAL HIP ARTHROPLASTY;  Surgeon: Johnny Bridge, MD;  Location: [facility];  Service: Orthopedics;  Laterality: Left;   Social History   Tobacco Use   Smoking status: Never   Smokeless tobacco: Never  Vaping Use   Vaping Use: Never used  Substance  Use Topics   Alcohol use: Not Currently    Comment: ocassional    Drug use: Yes    Types: Oxycodone   Social History   Socioeconomic History   Marital status: Married    Spouse name: Not on file   Number of children: 2   Years of education: Not on file   Highest education level: Not on file  Occupational History   Occupation: Disabled  Tobacco Use   Smoking status: Never   Smokeless tobacco: Never  Vaping Use   Vaping Use: Never used  Substance and Sexual Activity   Alcohol use: Not Currently    Comment:  ocassional    Drug use: Yes    Types: Oxycodone   Sexual activity: Yes  Other Topics Concern   Not on file  Social History Narrative   Not on file   Social Determinants of Health   Financial Resource Strain: Low Risk  (02/26/2022)   Overall Financial Resource Strain (CARDIA)    Difficulty of Paying Living Expenses: Not hard at all  Food Insecurity: No Food Insecurity (02/26/2022)   Hunger Vital Sign    Worried About Running Out of Food in the Last Year: Never true    [place] in the Last Year: Never true  Transportation Needs: No Transportation Needs (02/26/2022)   PRAPARE - Hydrologist (Medical): No    Lack of Transportation (Non-Medical): No  Physical Activity: Sufficiently Active (02/26/2022)   Exercise Vital Sign    Days of Exercise per Week: 4 days    Minutes of Exercise per Session: 60 min  Stress: No Stress Concern Present (02/26/2022)   [place]    Feeling of Stress : Not at all  Social Connections: Not on file  Intimate Partner Violence: Not At Risk (02/01/2019)   Humiliation, Afraid, Rape, and Kick questionnaire    Fear of Current or Ex-Partner: No    Emotionally Abused: No    Physically Abused: No    Sexually Abused: No   Family Status  Relation Name Status   Father  Deceased   Mother  Deceased   Neg Hx  (Not Specified)   Family History  Problem Relation Age of Onset   Cancer - Other Father    Liver cancer Father    Diabetes Mother    CAD Neg Hx    No Known Allergies    Review of Systems  Constitutional: Negative.   HENT: Negative.    Eyes: Negative.   Respiratory: Negative.    Cardiovascular: Negative.   Gastrointestinal: Negative.   Genitourinary: Negative.   Musculoskeletal:  Positive for back pain and joint pain.  Skin: Negative.   Neurological: Negative.   Psychiatric/Behavioral: Negative.        Objective:     BP (!) 155/108 (BP  Location: Right Arm, Patient Position: Sitting, Cuff Size: Large)   Pulse 87   Temp (!) 97.2 F (36.2 C)   Ht 5' 6.6" (1.692 m)   Wt 232 lb (105.2 kg)   SpO2 100%   BMI 36.77 kg/m  BP Readings from Last 3 Encounters:  06/10/22 (!) 155/108  06/04/22 (!) 157/100  02/26/22 130/78   Wt Readings from Last 3 Encounters:  06/10/22 232 lb (105.2 kg)  06/04/22 228 lb 9.6 oz (103.7 kg)  02/26/22 227 lb (103 kg)      Physical Exam Constitutional:      Appearance: Normal  appearance. He is obese.  Eyes:     Pupils: Pupils are equal, round, and reactive to light.  Cardiovascular:     Rate and Rhythm: Normal rate and regular rhythm.     Pulses: Normal pulses.  Pulmonary:     Effort: Pulmonary effort is normal.  Abdominal:     General: Bowel sounds are normal.  Skin:    General: Skin is warm and dry.  Neurological:     General: No focal deficit present.     Mental Status: He is alert. Mental status is at baseline.  Psychiatric:        Mood and Affect: Mood normal.        Thought Content: Thought content normal.        Judgment: Judgment normal.      No results found for any visits on 06/10/22.  Last CBC Lab Results  Component Value Date   WBC 9.5 06/04/2022   HGB 12.2 (L) 06/04/2022   HCT 36.7 (L) 06/04/2022   MCV 89 06/04/2022   MCH 29.6 06/04/2022   RDW 17.7 (H) 06/04/2022   PLT 448 70/62/3762   Last metabolic panel Lab Results  Component Value Date   GLUCOSE 162 (H) 06/04/2022   NA 144 06/04/2022   K 3.5 06/04/2022   CL 100 06/04/2022   CO2 25 06/04/2022   BUN 15 06/04/2022   CREATININE 1.23 06/04/2022   EGFR 72 06/04/2022   CALCIUM 9.8 06/04/2022   PROT 7.5 06/04/2022   ALBUMIN 4.7 06/04/2022   LABGLOB 2.8 06/04/2022   AGRATIO 1.7 06/04/2022   BILITOT 0.5 06/04/2022   ALKPHOS 71 06/04/2022   AST 30 06/04/2022   ALT 24 06/04/2022   ANIONGAP 13 09/29/2016   Last lipids Lab Results  Component Value Date   CHOL 122 02/20/2022   HDL 36 (L)  02/20/2022   LDLCALC 59 02/20/2022   TRIG 155 (H) 02/20/2022   CHOLHDL 3.4 02/20/2022   Last hemoglobin A1c Lab Results  Component Value Date   HGBA1C 4.4 (L) 02/20/2022   Last thyroid functions Lab Results  Component Value Date   TSH 2.890 08/13/2020   Last vitamin D Lab Results  Component Value Date   VD25OH 57.6 06/04/2022   Last vitamin B12 and Folate Lab Results  Component Value Date   VITAMINB12 172 (L) 05/11/2015   FOLATE 52.2 05/11/2015      The ASCVD Risk score (Arnett DK, et al., 2019) failed to calculate for the following reasons:   The valid total cholesterol range is 130 to 320 mg/dL    Assessment & Plan:   Problem List Items Addressed This Visit       Other   Sickle cell disease ([place]) - Primary   Relevant Medications   oxyCODONE-acetaminophen (PERCOCET) 7.5-325 MG tablet   Chronic pain   Relevant Medications   oxyCODONE-acetaminophen (PERCOCET) 7.5-325 MG tablet   1. Sickle cell disease without crisis [facility]) Recommend that patient continues to follow-up with his primary care provider for chronic conditions. Also, patient will need to follow-up with his hematology team at [facility] starting disease modifying agents as discussed to assist with his sickle cell pain crisis. On review of medical records, it appears the patient has fairly well-controlled sickle cell disease with infrequent pain crises. - folic acid (FOLVITE) 1 MG tablet; Take 1 tablet (1 mg total) by mouth daily.  Dispense: 30 tablet; Refill: 11 - ketorolac (TORADOL) injection 60 mg  2. Other chronic pain Recommend  that patient follows up with sickle cell day infusion clinic for management of acute sickle cell pain crisis.  Patient was given all information pertaining to triage, clinic operation, and was given a tour of sickle cell clinic.  - ketorolac (TORADOL) injection 60 mg Follow-up with sickle cell day infusion clinic for management of any acute sickle cell pain  crisis or follow-up at [facility] long hospital after hours.  Patient will continue to follow with his primary care provider for chronic conditions. Donia Pounds  APRN, MSN, FNP-C Patient [facility] [address] [place], [place] [address] [phone]

## 2022-06-26 ENCOUNTER — Ambulatory Visit (INDEPENDENT_AMBULATORY_CARE_PROVIDER_SITE_OTHER): Payer: Medicare Other | Admitting: Internal Medicine

## 2022-06-26 ENCOUNTER — Encounter: Payer: Self-pay | Admitting: Internal Medicine

## 2022-06-26 VITALS — BP 144/92 | HR 92 | Temp 98.2°F | Ht 66.6 in | Wt 227.6 lb

## 2022-06-26 DIAGNOSIS — D571 Sickle-cell disease without crisis: Secondary | ICD-10-CM

## 2022-06-26 DIAGNOSIS — N182 Chronic kidney disease, stage 2 (mild): Secondary | ICD-10-CM

## 2022-06-26 DIAGNOSIS — Z6836 Body mass index (BMI) 36.0-36.9, adult: Secondary | ICD-10-CM

## 2022-06-26 DIAGNOSIS — Z8639 Personal history of other endocrine, nutritional and metabolic disease: Secondary | ICD-10-CM

## 2022-06-26 DIAGNOSIS — E1122 Type 2 diabetes mellitus with diabetic chronic kidney disease: Secondary | ICD-10-CM

## 2022-06-26 DIAGNOSIS — I129 Hypertensive chronic kidney disease with stage 1 through stage 4 chronic kidney disease, or unspecified chronic kidney disease: Secondary | ICD-10-CM

## 2022-06-26 DIAGNOSIS — G8929 Other chronic pain: Secondary | ICD-10-CM

## 2022-06-26 MED ORDER — VALSARTAN 160 MG PO TABS: 160.0000 mg | ORAL_TABLET | Freq: Every day | ORAL | 11 refills | Status: DC

## 2022-06-26 MED ORDER — KETOROLAC TROMETHAMINE 60 MG/2ML IM SOLN
60.0000 mg | Freq: Once | INTRAMUSCULAR | Status: AC
  Administered 2022-06-26: 60 mg via INTRAMUSCULAR

## 2022-06-26 MED ORDER — OXYCODONE-ACETAMINOPHEN 7.5-325 MG PO TABS: 1.0000 | ORAL_TABLET | ORAL | 0 refills | Status: DC | PRN

## 2022-06-26 NOTE — Progress Notes (Signed)
[name],acting as a Education administrator for [name], MD.,have documented all relevant documentation on the behalf of [name], MD,as directed by  [name], MD while in the presence of [name], MD.    Subjective:     Patient ID: Nicolas [name] , male    DOB: June 27, 1976 , 46 y.o.   MRN: [id number]   Chief Complaint  Patient presents with   Diabetes   Hypertension    HPI  He presents today for diabetes and HTN follow-up.  He reports compliance with meds. He denies visual disturbances, chest pain and shortness of breath.   Diabetes He presents for his follow-up diabetic visit. He has type 2 diabetes mellitus. There are no hypoglycemic associated symptoms. Pertinent negatives for diabetes include no blurred vision, no chest pain, no polydipsia, no polyphagia and no polyuria. There are no hypoglycemic complications. Diabetic complications include nephropathy. Risk factors for coronary artery disease include diabetes mellitus, dyslipidemia, hypertension, male sex, sedentary lifestyle and obesity. Current diabetic treatment includes oral agent (dual therapy). He is following a diabetic diet. He participates in exercise intermittently. His home blood glucose trend is fluctuating minimally. His breakfast blood glucose is taken between 8-9 am. His breakfast blood glucose range is generally 110-130 mg/dl. An ACE inhibitor/angiotensin II receptor blocker is being taken.  Hypertension This is a chronic problem. The current episode started more than 1 year ago. The problem has been gradually improving since onset. The problem is controlled. Pertinent negatives include no blurred vision, chest pain, palpitations or shortness of breath. Past treatments include ACE inhibitors, calcium channel blockers and beta blockers. The current treatment provides moderate improvement. Compliance problems include exercise.  Hypertensive end-organ damage includes kidney disease.     Past Medical  History:  Diagnosis Date   Arthritis    arthritis- back & L hip   Depression    situational depression, pt. out of work    Diabetes mellitus ([place])    HTN (hypertension) 05/10/2015   Osteoarthritis of left hip 06/07/2012   Sickle cell anemia (HCC)      Family History  Problem Relation Age of Onset   Cancer - Other Father    Liver cancer Father    Diabetes Mother    CAD Neg Hx      Current Outpatient Medications:    amLODipine (NORVASC) 10 MG tablet, TAKE 1 TABLET BY MOUTH EVERY DAY, Disp: 90 tablet, Rfl: 1   carvedilol (COREG) 6.25 MG tablet, TAKE 1 TABLET BY MOUTH TWICE A DAY WITH MEALS, Disp: 180 tablet, Rfl: 1   Cholecalciferol (VITAMIN D3) 2000 units capsule, Take 2,000 Units by mouth daily., Disp: , Rfl: 5   cyclobenzaprine (FLEXERIL) 10 MG tablet, Take 10 mg by mouth 3 (three) times daily as needed for muscle spasms., Disp: , Rfl:    diclofenac Sodium (VOLTAREN) 1 % GEL, Apply 2 g topically 3 (three) times daily as needed., Disp: 010 g, Rfl: 1   folic acid (FOLVITE) 1 MG tablet, Take 1 tablet (1 mg total) by mouth daily., Disp: 30 tablet, Rfl: 11   glucose blood test strip, Use as instructed (Patient taking differently: 1 each by Other route every other day. Use as instructed), Disp: 100 each, Rfl: 12   ibuprofen (ADVIL) 800 MG tablet, Take 1 tablet (800 mg total) by mouth every 8 (eight) hours as needed., Disp: 30 tablet, Rfl: 0   Lancets (ACCU-CHEK MULTICLIX) lancets, Use as instructed (Patient taking differently: 1 each by Other  route every other day.), Disp: 100 each, Rfl: 12   metFORMIN (GLUCOPHAGE) 500 MG tablet, TAKE 1 TABLET BY MOUTH TWICE A DAY, Disp: 180 tablet, Rfl: 2   Multiple Vitamin (ONE-A-DAY MENS PO), Take by mouth. 1 daily, Disp: , Rfl:    OXBRYTA 500 MG TABS tablet, Take by mouth., Disp: , Rfl:    OZEMPIC, 0.25 OR 0.5 MG/DOSE, 2 MG/1.5ML SOPN, INJECT 0.5 MG INTO THE SKIN ONCE A WEEK., Disp: 4.5 mL, Rfl: 3   pravastatin (PRAVACHOL) 20 MG tablet, TAKE 1 TABLET BY  MOUTH EVERY DAY IN THE EVENING, Disp: 90 tablet, Rfl: 0   triamterene-hydrochlorothiazide (MAXZIDE-25) 37.5-25 MG tablet, TAKE 1 TABLET BY MOUTH EVERY DAY, Disp: 90 tablet, Rfl: 1   valsartan (DIOVAN) 160 MG tablet, Take 1 tablet (160 mg total) by mouth daily., Disp: 30 tablet, Rfl: 11   oxyCODONE-acetaminophen (PERCOCET) 7.5-325 MG tablet, Take 1 tablet by mouth every 4 (four) hours as needed for severe pain., Disp: 20 tablet, Rfl: 0   No Known Allergies   Review of Systems  Constitutional: Negative.   Eyes: Negative.  Negative for blurred vision.  Respiratory:  Negative for shortness of breath.   Cardiovascular:  Negative for chest pain and palpitations.  Endocrine: Negative for polydipsia, polyphagia and polyuria.     Today's Vitals   06/26/22 1532 06/26/22 1621  BP: (!) 142/90 (!) 144/92  Pulse: 92   Temp: 98.2 F (36.8 C)   Weight: 227 lb 9.6 oz (103.2 kg)   Height: 5' 6.6" (1.692 m)   PainSc: 9    PainLoc: Back    Body mass index is 36.08 kg/m.  Wt Readings from Last 3 Encounters:  06/26/22 227 lb 9.6 oz (103.2 kg)  06/10/22 232 lb (105.2 kg)  06/04/22 228 lb 9.6 oz (103.7 kg)    BP Readings from Last 3 Encounters:  06/26/22 (!) 144/92  06/10/22 (!) 155/108  06/04/22 (!) 157/100     Objective:  Physical Exam Vitals and nursing note reviewed.  Constitutional:      Appearance: Normal appearance. He is obese.  HENT:     Head: Normocephalic and atraumatic.  Eyes:     Extraocular Movements: Extraocular movements intact.  Cardiovascular:     Rate and Rhythm: Normal rate and regular rhythm.     Heart sounds: Normal heart sounds.  Pulmonary:     Effort: Pulmonary effort is normal.     Breath sounds: Normal breath sounds.  Musculoskeletal:     Cervical back: Normal range of motion.  Skin:    General: Skin is warm.  Neurological:     General: No focal deficit present.     Mental Status: He is alert.  Psychiatric:        Mood and Affect: Mood normal.       Assessment And Plan:     1. Type 2 diabetes mellitus with stage 2 chronic kidney disease, without long-term current use of insulin (HCC) Comments: Chronic, I will check labs as below. Unable to follow a1c due to h/o sickle cell.  He declines PREP referral.  - Fructosamine  2. Hypertensive nephropathy Comments: Uncontrolled. I will d/c lisinopril. I will send valsartan 160mg  daily. he will rto in 2weeks for re-evaluation. I will check BMP at that time.   3. Sickle cell disease without crisis ([place]) Comments: Chronic, please see below. He is encouraged to stay well hydrated, c/w folic acid supplementation.  - oxyCODONE-acetaminophen (PERCOCET) 7.5-325 MG tablet; Take 1 tablet by mouth  every 4 (four) hours as needed for severe pain.  Dispense: 20 tablet; Refill: 0  4. Other chronic pain Comments: PDMP reviewed.  I will send rx Percocet for him to take prn. He has upcoming appt w/ Hematology at [facility] (PERCOCET) 7.5-325 MG tablet; Take 1 tablet by mouth every 4 (four) hours as needed for severe pain.  Dispense: 20 tablet; Refill: 0 - ketorolac (TORADOL) injection 60 mg  5. Class 2 severe obesity due to excess calories with serious comorbidity and body mass index (BMI) of 36.0 to 36.9 in adult [facility]) Comments: He is encouraged to aim for at least 150 minutes of exercise per week, while striving for BMI<30 to decrease cardiac risk.   6. History of non anemic vitamin B12 deficiency - Vitamin B12   Patient was given opportunity to ask questions. Patient verbalized understanding of the plan and was able to repeat key elements of the plan. All questions were answered to their satisfaction.   I, [name], MD, have reviewed all documentation for this visit. The documentation on 06/26/22 for the exam, diagnosis, procedures, and orders are all accurate and complete.   IF YOU HAVE BEEN REFERRED TO A SPECIALIST, IT MAY TAKE 1-2 WEEKS TO SCHEDULE/PROCESS THE REFERRAL.  IF YOU HAVE NOT HEARD FROM US/SPECIALIST IN TWO WEEKS, PLEASE GIVE [place] A CALL AT [phone].   THE PATIENT IS ENCOURAGED TO PRACTICE SOCIAL DISTANCING DUE TO THE COVID-19 PANDEMIC.

## 2022-06-26 NOTE — Patient Instructions (Signed)

## 2022-06-27 LAB — FRUCTOSAMINE: Fructosamine: 243 umol/L (ref 0–285)

## 2022-06-27 LAB — VITAMIN B12: Vitamin B-12: 296 pg/mL (ref 232–1245)

## 2022-06-29 ENCOUNTER — Encounter: Payer: Self-pay | Admitting: Internal Medicine

## 2022-07-03 DIAGNOSIS — Z760 Encounter for issue of repeat prescription: Secondary | ICD-10-CM | POA: Diagnosis not present

## 2022-07-03 DIAGNOSIS — Z79899 Other long term (current) drug therapy: Secondary | ICD-10-CM | POA: Diagnosis not present

## 2022-07-03 DIAGNOSIS — E559 Vitamin D deficiency, unspecified: Secondary | ICD-10-CM | POA: Diagnosis not present

## 2022-07-03 DIAGNOSIS — G8929 Other chronic pain: Secondary | ICD-10-CM | POA: Diagnosis not present

## 2022-07-08 DIAGNOSIS — N182 Chronic kidney disease, stage 2 (mild): Secondary | ICD-10-CM | POA: Diagnosis not present

## 2022-07-08 DIAGNOSIS — I129 Hypertensive chronic kidney disease with stage 1 through stage 4 chronic kidney disease, or unspecified chronic kidney disease: Secondary | ICD-10-CM | POA: Diagnosis not present

## 2022-07-08 DIAGNOSIS — N179 Acute kidney failure, unspecified: Secondary | ICD-10-CM | POA: Diagnosis not present

## 2022-07-08 DIAGNOSIS — E1122 Type 2 diabetes mellitus with diabetic chronic kidney disease: Secondary | ICD-10-CM | POA: Diagnosis not present

## 2022-07-08 DIAGNOSIS — E559 Vitamin D deficiency, unspecified: Secondary | ICD-10-CM | POA: Diagnosis not present

## 2022-07-08 DIAGNOSIS — R809 Proteinuria, unspecified: Secondary | ICD-10-CM | POA: Diagnosis not present

## 2022-07-11 ENCOUNTER — Other Ambulatory Visit: Payer: Self-pay | Admitting: Nephrology

## 2022-07-11 DIAGNOSIS — E1122 Type 2 diabetes mellitus with diabetic chronic kidney disease: Secondary | ICD-10-CM

## 2022-07-11 DIAGNOSIS — N182 Chronic kidney disease, stage 2 (mild): Secondary | ICD-10-CM

## 2022-07-11 DIAGNOSIS — R809 Proteinuria, unspecified: Secondary | ICD-10-CM

## 2022-07-11 DIAGNOSIS — I129 Hypertensive chronic kidney disease with stage 1 through stage 4 chronic kidney disease, or unspecified chronic kidney disease: Secondary | ICD-10-CM

## 2022-07-15 ENCOUNTER — Ambulatory Visit: Payer: Medicare Other

## 2022-07-15 VITALS — BP 140/64 | HR 84 | Temp 98.6°F | Ht 66.0 in | Wt 227.0 lb

## 2022-07-15 DIAGNOSIS — Z79899 Other long term (current) drug therapy: Secondary | ICD-10-CM

## 2022-07-15 DIAGNOSIS — I129 Hypertensive chronic kidney disease with stage 1 through stage 4 chronic kidney disease, or unspecified chronic kidney disease: Secondary | ICD-10-CM

## 2022-07-15 NOTE — Progress Notes (Signed)
Patient presents today for BP check, patient is currently on Amlodipine 10mg  at night, triamterene-hydrochlorothiazide 37.5-25mg , and valsartan 160mg . Patient hasnt taken his bp medication today, he states he forgot.  BP Readings from Last 3 Encounters:  07/15/22 (!) 160/64  06/26/22 (!) 144/92  06/10/22 (!) 155/108

## 2022-07-16 LAB — BMP8+EGFR
BUN/Creatinine Ratio: 10 (ref 9–20)
BUN: 12 mg/dL (ref 6–24)
CO2: 25 mmol/L (ref 20–29)
Calcium: 9.5 mg/dL (ref 8.7–10.2)
Chloride: 99 mmol/L (ref 96–106)
Creatinine, Ser: 1.16 mg/dL (ref 0.76–1.27)
Glucose: 129 mg/dL — ABNORMAL HIGH (ref 70–99)
Potassium: 3.5 mmol/L (ref 3.5–5.2)
Sodium: 142 mmol/L (ref 134–144)
eGFR: 78 mL/min/{1.73_m2} (ref >59)

## 2022-07-25 ENCOUNTER — Other Ambulatory Visit: Payer: Medicare Other

## 2022-07-28 ENCOUNTER — Other Ambulatory Visit: Payer: Self-pay | Admitting: Internal Medicine

## 2022-07-28 ENCOUNTER — Ambulatory Visit
Admission: RE | Admit: 2022-07-28 | Discharge: 2022-07-28 | Disposition: A | Discharge status: Home / Self Care | Payer: Medicare Other | Source: Ambulatory Visit | Attending: Nephrology | Admitting: Nephrology

## 2022-07-28 DIAGNOSIS — N189 Chronic kidney disease, unspecified: Secondary | ICD-10-CM | POA: Diagnosis not present

## 2022-07-28 DIAGNOSIS — I129 Hypertensive chronic kidney disease with stage 1 through stage 4 chronic kidney disease, or unspecified chronic kidney disease: Secondary | ICD-10-CM

## 2022-07-28 DIAGNOSIS — R809 Proteinuria, unspecified: Secondary | ICD-10-CM

## 2022-07-28 DIAGNOSIS — N182 Chronic kidney disease, stage 2 (mild): Secondary | ICD-10-CM

## 2022-07-28 DIAGNOSIS — E1122 Type 2 diabetes mellitus with diabetic chronic kidney disease: Secondary | ICD-10-CM

## 2022-08-12 ENCOUNTER — Ambulatory Visit: Payer: Medicare Other

## 2022-08-12 VITALS — BP 130/74 | HR 94 | Temp 99.3°F | Wt 229.0 lb

## 2022-08-12 DIAGNOSIS — I129 Hypertensive chronic kidney disease with stage 1 through stage 4 chronic kidney disease, or unspecified chronic kidney disease: Secondary | ICD-10-CM

## 2022-08-12 NOTE — Progress Notes (Signed)
Patient presents today for BPC. He is currently taking Amlodipine 10mg  Carvedilol 6.25mg  and Valsartan 160mg .   BP Readings from Last 3 Encounters:  08/12/22 130/74  07/15/22 (!) 140/64  06/26/22 (!) 144/92   Provider encourages patient to exercise to help with weight and hypertension. Pt declined PREP Program at this time. Pt will follow up as scheduled with PCP.

## 2022-08-25 ENCOUNTER — Other Ambulatory Visit: Payer: Self-pay | Admitting: Internal Medicine

## 2022-09-11 ENCOUNTER — Other Ambulatory Visit: Payer: Self-pay | Admitting: Internal Medicine

## 2022-09-16 ENCOUNTER — Ambulatory Visit: Payer: Self-pay | Admitting: Family Medicine

## 2022-09-25 ENCOUNTER — Other Ambulatory Visit: Payer: Self-pay

## 2022-09-25 ENCOUNTER — Encounter (HOSPITAL_COMMUNITY): Payer: Self-pay

## 2022-09-25 ENCOUNTER — Emergency Department (HOSPITAL_COMMUNITY)
Admission: EM | Admit: 2022-09-25 | Discharge: 2022-09-25 | Discharge status: Against Medical Advice | Payer: Medicare Other | Attending: Emergency Medicine | Admitting: Emergency Medicine

## 2022-09-25 DIAGNOSIS — Z1152 Encounter for screening for COVID-19: Secondary | ICD-10-CM | POA: Insufficient documentation

## 2022-09-25 DIAGNOSIS — M791 Myalgia, unspecified site: Secondary | ICD-10-CM | POA: Diagnosis not present

## 2022-09-25 DIAGNOSIS — Z5321 Procedure and treatment not carried out due to patient leaving prior to being seen by health care provider: Secondary | ICD-10-CM | POA: Insufficient documentation

## 2022-09-25 DIAGNOSIS — J101 Influenza due to other identified influenza virus with other respiratory manifestations: Secondary | ICD-10-CM | POA: Diagnosis not present

## 2022-09-25 DIAGNOSIS — R5383 Other fatigue: Secondary | ICD-10-CM | POA: Diagnosis not present

## 2022-09-25 LAB — CBC WITH DIFFERENTIAL/PLATELET
Abs Immature Granulocytes: 0.3 10*3/uL — ABNORMAL HIGH (ref 0.00–0.07)
Basophils Absolute: 0 10*3/uL (ref 0.0–0.1)
Basophils Relative: 0 %
Eosinophils Absolute: 0.2 10*3/uL (ref 0.0–0.5)
Eosinophils Relative: 1 %
HCT: 28.7 % — ABNORMAL LOW (ref 39.0–52.0)
Hemoglobin: 10.7 g/dL — ABNORMAL LOW (ref 13.0–17.0)
Lymphocytes Relative: 26 %
Lymphs Abs: 3.9 10*3/uL (ref 0.7–4.0)
MCH: 27.9 pg (ref 26.0–34.0)
MCHC: 37.3 g/dL — ABNORMAL HIGH (ref 30.0–36.0)
MCV: 74.7 fL — ABNORMAL LOW (ref 80.0–100.0)
Monocytes Absolute: 1.4 10*3/uL — ABNORMAL HIGH (ref 0.1–1.0)
Monocytes Relative: 9 %
Myelocytes: 2 %
Neutro Abs: 9.3 10*3/uL — ABNORMAL HIGH (ref 1.7–7.7)
Neutrophils Relative %: 62 %
Platelets: 234 10*3/uL (ref 150–400)
RBC: 3.84 MIL/uL — ABNORMAL LOW (ref 4.22–5.81)
RDW: 20.1 % — ABNORMAL HIGH (ref 11.5–15.5)
WBC: 15 10*3/uL — ABNORMAL HIGH (ref 4.0–10.5)
nRBC: 14.5 % — ABNORMAL HIGH (ref 0.0–0.2)
nRBC: 35 /100 WBC — ABNORMAL HIGH

## 2022-09-25 LAB — RESP PANEL BY RT-PCR (RSV, FLU A&B, COVID)  RVPGX2
Influenza A by PCR: NEGATIVE
Influenza B by PCR: POSITIVE — AB
Resp Syncytial Virus by PCR: NEGATIVE
SARS Coronavirus 2 by RT PCR: NEGATIVE

## 2022-09-25 LAB — BASIC METABOLIC PANEL
Anion gap: 10 (ref 5–15)
BUN: 63 mg/dL — ABNORMAL HIGH (ref 6–20)
CO2: 22 mmol/L (ref 22–32)
Calcium: 9 mg/dL (ref 8.9–10.3)
Chloride: 109 mmol/L (ref 98–111)
Creatinine, Ser: 2.03 mg/dL — ABNORMAL HIGH (ref 0.61–1.24)
GFR, Estimated: 40 mL/min — ABNORMAL LOW (ref >60)
Glucose, Bld: 111 mg/dL — ABNORMAL HIGH (ref 70–99)
Potassium: 3.4 mmol/L — ABNORMAL LOW (ref 3.5–5.1)
Sodium: 141 mmol/L (ref 135–145)

## 2022-09-25 NOTE — ED Provider Triage Note (Signed)
Emergency Medicine Provider Triage Evaluation Note  Nicolas [name] , a 47 y.o. male  was evaluated in triage.  Pt complains of fatigue and diarrhea x2 days. Son has flu. OK PO intake.  Review of Systems  Positive: fatigue Negative: Chest pain, SOB  Physical Exam  BP (!) 125/59   Pulse 85   Temp 99 F (37.2 C)   Resp 18   Ht 5\' 8"  (1.727 m)   Wt 98.9 kg   SpO2 98%   BMI 33.15 kg/m  Gen:   Awake, no distress   Resp:  Normal effort  MSK:   Moves extremities without difficulty  Other:    Medical Decision Making  Medically screening exam initiated at 3:23 PM.  Appropriate orders placed.  Bence Edmondson was informed that the remainder of the evaluation will be completed by another provider, this initial triage assessment does not replace that evaluation, and the importance of remaining in the ED until their evaluation is complete.    [place], Pete Pelt 09/25/22 1524

## 2022-09-25 NOTE — ED Triage Notes (Addendum)
Pt reports since the 23rd, he has been having some body aches and generalized fatigue. Pt reports he was recently around his son, who had the flu. Pt denies any other symptoms. AxOx4. NAD. VSS. Pt does have hx of Sickle Cell Anemia. Pt reports he does not feel like he is in a crisis.

## 2022-09-25 NOTE — ED Notes (Signed)
Pt stated they wanted to leave and they left.

## 2022-09-26 ENCOUNTER — Telehealth: Payer: Self-pay

## 2022-09-26 NOTE — Telephone Encounter (Signed)
Transition Care Management Follow-up Telephone Call Date of discharge and from where: 09/25/2022 How have you been since you were released from the hospital? Pt states he still feels bad. He was tested for flu. But being the wait was too long, he left. He was not treated. He was also not given result for test. Patient states  Any questions or concerns? No  Items Reviewed: Did the pt receive and understand the discharge instructions provided? No  Medications obtained and verified? Yes  Other? No  Any new allergies since your discharge? No  Dietary orders reviewed? Yes Do you have support at home? Yes   Home Care and Equipment/Supplies: Were home health services ordered? no If so, what is the name of the agency? N/a  Has the agency set up a time to come to the patient's home? no Were any new equipment or medical supplies ordered?  No What is the name of the medical supply agency? N/a Were you able to get the supplies/equipment? no Do you have any questions related to the use of the equipment or supplies? No  Functional Questionnaire: (I = Independent and D = Dependent) ADLs: i  Bathing/Dressing- i  Meal Prep- i  Eating- i  Maintaining continence- i  Transferring/Ambulation- i  Managing Meds- i  Follow up appointments reviewed:  PCP Hospital f/u appt confirmed? No  Scheduled to see n/a on n/a @ n/a. Specialist Hospital f/u appt confirmed? No  Scheduled to see n/a on n/a @ n/a. Are transportation arrangements needed? No  If their condition worsens, is the pt aware to call PCP or go to the Emergency Dept.? No Was the patient provided with contact information for the PCP's office or ED? No Was to pt encouraged to call back with questions or concerns? No

## 2022-09-27 ENCOUNTER — Emergency Department (HOSPITAL_COMMUNITY): Payer: Medicare Other

## 2022-09-27 ENCOUNTER — Emergency Department (HOSPITAL_COMMUNITY)
Admission: EM | Admit: 2022-09-27 | Discharge: 2022-09-27 | Disposition: A | Discharge status: Home / Self Care | Payer: Medicare Other | Attending: Emergency Medicine | Admitting: Emergency Medicine

## 2022-09-27 ENCOUNTER — Encounter (HOSPITAL_COMMUNITY): Payer: Self-pay

## 2022-09-27 DIAGNOSIS — M19012 Primary osteoarthritis, left shoulder: Secondary | ICD-10-CM | POA: Diagnosis not present

## 2022-09-27 DIAGNOSIS — N179 Acute kidney failure, unspecified: Secondary | ICD-10-CM | POA: Insufficient documentation

## 2022-09-27 DIAGNOSIS — J101 Influenza due to other identified influenza virus with other respiratory manifestations: Secondary | ICD-10-CM | POA: Insufficient documentation

## 2022-09-27 DIAGNOSIS — D5700 Hb-SS disease with crisis, unspecified: Secondary | ICD-10-CM | POA: Insufficient documentation

## 2022-09-27 DIAGNOSIS — M7918 Myalgia, other site: Secondary | ICD-10-CM | POA: Diagnosis present

## 2022-09-27 DIAGNOSIS — E876 Hypokalemia: Secondary | ICD-10-CM | POA: Insufficient documentation

## 2022-09-27 DIAGNOSIS — D649 Anemia, unspecified: Secondary | ICD-10-CM | POA: Diagnosis not present

## 2022-09-27 DIAGNOSIS — M47814 Spondylosis without myelopathy or radiculopathy, thoracic region: Secondary | ICD-10-CM | POA: Diagnosis not present

## 2022-09-27 DIAGNOSIS — J111 Influenza due to unidentified influenza virus with other respiratory manifestations: Secondary | ICD-10-CM

## 2022-09-27 LAB — CBC WITH DIFFERENTIAL/PLATELET
Abs Immature Granulocytes: 0.26 10*3/uL — ABNORMAL HIGH (ref 0.00–0.07)
Basophils Absolute: 0 10*3/uL (ref 0.0–0.1)
Basophils Relative: 0 %
Eosinophils Absolute: 0.1 10*3/uL (ref 0.0–0.5)
Eosinophils Relative: 1 %
HCT: 27.6 % — ABNORMAL LOW (ref 39.0–52.0)
Hemoglobin: 9.9 g/dL — ABNORMAL LOW (ref 13.0–17.0)
Immature Granulocytes: 2 %
Lymphocytes Relative: 7 %
Lymphs Abs: 1.1 10*3/uL (ref 0.7–4.0)
MCH: 27.1 pg (ref 26.0–34.0)
MCHC: 35.9 g/dL (ref 30.0–36.0)
MCV: 75.6 fL — ABNORMAL LOW (ref 80.0–100.0)
Monocytes Absolute: 1.7 10*3/uL — ABNORMAL HIGH (ref 0.1–1.0)
Monocytes Relative: 11 %
Neutro Abs: 12.3 10*3/uL — ABNORMAL HIGH (ref 1.7–7.7)
Neutrophils Relative %: 79 %
Platelets: 332 10*3/uL (ref 150–400)
RBC: 3.65 MIL/uL — ABNORMAL LOW (ref 4.22–5.81)
RDW: 20.4 % — ABNORMAL HIGH (ref 11.5–15.5)
WBC: 15.4 10*3/uL — ABNORMAL HIGH (ref 4.0–10.5)
nRBC: 8.7 % — ABNORMAL HIGH (ref 0.0–0.2)

## 2022-09-27 LAB — RETICULOCYTES
Immature Retic Fract: 48.3 % — ABNORMAL HIGH (ref 2.3–15.9)
RBC.: 3.59 MIL/uL — ABNORMAL LOW (ref 4.22–5.81)
Retic Count, Absolute: 234.8 10*3/uL — ABNORMAL HIGH (ref 19.0–186.0)
Retic Ct Pct: 6.5 % — ABNORMAL HIGH (ref 0.4–3.1)

## 2022-09-27 LAB — MAGNESIUM: Magnesium: 1.9 mg/dL (ref 1.7–2.4)

## 2022-09-27 LAB — COMPREHENSIVE METABOLIC PANEL
ALT: 48 U/L — ABNORMAL HIGH (ref 0–44)
AST: 40 U/L (ref 15–41)
Albumin: 3.5 g/dL (ref 3.5–5.0)
Alkaline Phosphatase: 65 U/L (ref 38–126)
Anion gap: 11 (ref 5–15)
BUN: 28 mg/dL — ABNORMAL HIGH (ref 6–20)
CO2: 23 mmol/L (ref 22–32)
Calcium: 9.1 mg/dL (ref 8.9–10.3)
Chloride: 108 mmol/L (ref 98–111)
Creatinine, Ser: 1.37 mg/dL — ABNORMAL HIGH (ref 0.61–1.24)
GFR, Estimated: >60 mL/min (ref >60)
Glucose, Bld: 129 mg/dL — ABNORMAL HIGH (ref 70–99)
Potassium: 3.1 mmol/L — ABNORMAL LOW (ref 3.5–5.1)
Sodium: 142 mmol/L (ref 135–145)
Total Bilirubin: 1.1 mg/dL (ref 0.3–1.2)
Total Protein: 6.9 g/dL (ref 6.5–8.1)

## 2022-09-27 MED ORDER — HYDROMORPHONE HCL 1 MG/ML IJ SOLN
0.5000 mg | INTRAMUSCULAR | Status: AC
  Administered 2022-09-27: 0.5 mg via SUBCUTANEOUS
  Filled 2022-09-27: qty 1

## 2022-09-27 MED ORDER — POTASSIUM CHLORIDE CRYS ER 20 MEQ PO TBCR
40.0000 meq | EXTENDED_RELEASE_TABLET | Freq: Once | ORAL | Status: AC
  Administered 2022-09-27: 40 meq via ORAL
  Filled 2022-09-27: qty 2

## 2022-09-27 MED ORDER — OSELTAMIVIR PHOSPHATE 75 MG PO CAPS: 75.0000 mg | ORAL_CAPSULE | Freq: Two times a day (BID) | ORAL | 0 refills | Status: DC

## 2022-09-27 MED ORDER — HYDROMORPHONE HCL 1 MG/ML IJ SOLN: 0.5000 mg | INTRAMUSCULAR | Status: DC

## 2022-09-27 NOTE — ED Provider Triage Note (Signed)
Emergency Medicine Provider Triage Evaluation Note  Nicolas [name] , a 46 y.o. male  was evaluated in triage.  Pt complains of and bilateral legs and left arm, patient states consistent with [place] sickle cell disease.  He has been around his son who is been diagnosed with the flu.  He went to urgent emergency room at [facility] yesterday but states he left due to the wait time.  His usual pain medications are not controlling his symptoms.  No documented fevers but states yesterday he had subjective fever.  No numbness tingling or weakness.  Review of Systems  Positive: Extremity pain Negative: Fevers chills shortness of breath cough  Physical Exam  BP (!) 147/77 (BP Location: Left Arm)   Pulse 94   Temp 99.1 F (37.3 C) (Oral)   Resp 18   SpO2 100%  Gen:   Awake, no distress   Resp:  Normal effort  MSK:   Moves extremities without difficulty  Other:  Abdomen soft nontender x 4, lungs clear to auscultation bilaterally  Medical Decision Making  Medically screening exam initiated at 2:37 PM.  Appropriate orders placed.  Nicolas [name] was informed that the remainder of the evaluation will be completed by another provider, this initial triage assessment does not replace that evaluation, and the importance of remaining in the ED until their evaluation is complete.     Ma Rings, [place] 09/27/22 1444

## 2022-09-27 NOTE — ED Provider Notes (Signed)
[facility]-EMERGENCY DEPT Provider Note   CSN: [id number] Arrival date & time: 09/27/22  1413     History  Chief Complaint  Patient presents with   Generalized Body Aches    Nicolas [name] is a 46 y.o. male.  HPI   46 year old male with medical history significant for sickle cell disease who presents to the emergency department with generalized bodyaches for the last 2 days.  The patient was seen in the emergency department at [facility] but left due to the wait time.  He did have laboratory testing done at that time and tested positive for influenza.  He denies any chest pain or shortness of breath.  He endorses generalized body aches and feels that it is triggering a sickle cell pain flare.  He has not been able to take any pain medicine as he was in the waiting room all day yesterday.  He denies any fevers but had a subjective fever yesterday.  No chest pain, no cough, no shortness of breath.  Home Medications Prior to Admission medications   Medication Sig Start Date End Date Taking? Authorizing Provider  oseltamivir (TAMIFLU) 75 MG capsule Take 1 capsule (75 mg total) by mouth every 12 (twelve) hours. 09/27/22  Yes Ernie Avena, MD  amLODipine (NORVASC) 10 MG tablet TAKE 1 TABLET BY MOUTH EVERY DAY 09/12/22   Dorothyann Peng, MD  carvedilol (COREG) 6.25 MG tablet TAKE 1 TABLET BY MOUTH TWICE A DAY WITH MEALS 09/12/22   Dorothyann Peng, MD  Cholecalciferol (VITAMIN D3) 2000 units capsule Take 2,000 Units by mouth daily. 02/05/16   [provider]  cyclobenzaprine (FLEXERIL) 10 MG tablet Take 10 mg by mouth 3 (three) times daily as needed for muscle spasms.    [provider]  diclofenac Sodium (VOLTAREN) 1 % GEL Apply 2 g topically 3 (three) times daily as needed. 06/19/21   Dorothyann Peng, MD  folic acid (FOLVITE) 1 MG tablet Take 1 tablet (1 mg total) by mouth daily. 06/10/22   Massie Maroon, FNP  glucose blood test strip Use as  instructed Patient taking differently: 1 each by Other route every other day. Use as instructed 08/15/13   Viyuoh, Adeline C, MD  ibuprofen (ADVIL) 800 MG tablet Take 1 tablet (800 mg total) by mouth every 8 (eight) hours as needed. 06/04/22   Ivonne Andrew, NP  Lancets (ACCU-CHEK MULTICLIX) lancets Use as instructed Patient taking differently: 1 each by Other route every other day. 08/15/13   Viyuoh, Rolland Bimler, MD  metFORMIN (GLUCOPHAGE) 500 MG tablet TAKE 1 TABLET BY MOUTH TWICE A DAY 09/12/22   Dorothyann Peng, MD  Multiple Vitamin (ONE-A-DAY MENS PO) Take by mouth. 1 daily    [provider]  OXBRYTA 500 MG TABS tablet Take by mouth. 07/23/20   [provider]  oxyCODONE-acetaminophen (PERCOCET) 7.5-325 MG tablet Take 1 tablet by mouth every 4 (four) hours as needed for severe pain. 06/26/22   Dorothyann Peng, MD  OZEMPIC, 0.25 OR 0.5 MG/DOSE, 2 MG/1.5ML SOPN INJECT 0.5 MG INTO THE SKIN ONCE A WEEK. 06/03/22   Dorothyann Peng, MD  pravastatin (PRAVACHOL) 20 MG tablet TAKE 1 TABLET BY MOUTH EVERY DAY IN THE EVENING 08/26/22   Dorothyann Peng, MD  triamterene-hydrochlorothiazide [facility]) 37.5-25 MG tablet TAKE 1 TABLET BY MOUTH EVERY DAY 09/12/22   Dorothyann Peng, MD  valsartan (DIOVAN) 160 MG tablet Take 1 tablet (160 mg total) by mouth daily. 06/26/22 06/26/23  Dorothyann Peng, MD  Allergies    Patient has no known allergies.    Review of Systems   Review of Systems  Constitutional:  Positive for chills, fatigue and fever.  Musculoskeletal:  Positive for arthralgias and myalgias.  All other systems reviewed and are negative.   Physical Exam Updated Vital Signs BP 136/86   Pulse 92   Temp 99 F (37.2 C) (Oral)   Resp 16   Ht 5\' 10"  (1.778 m)   Wt 99.8 kg   SpO2 98%   BMI 31.57 kg/m  Physical Exam Vitals and nursing note reviewed.  Constitutional:      General: He is not in acute distress.    Appearance: He is well-developed.  HENT:     Head:  Normocephalic and atraumatic.  Eyes:     Conjunctiva/sclera: Conjunctivae normal.  Cardiovascular:     Rate and Rhythm: Normal rate and regular rhythm.  Pulmonary:     Effort: Pulmonary effort is normal. No respiratory distress.     Breath sounds: Normal breath sounds.  Abdominal:     Palpations: Abdomen is soft.     Tenderness: There is no abdominal tenderness.  Musculoskeletal:        General: No swelling.     Cervical back: Neck supple.  Skin:    General: Skin is warm and dry.     Capillary Refill: Capillary refill takes less than 2 seconds.  Neurological:     Mental Status: He is alert and oriented to person, place, and time. Mental status is at baseline.  Psychiatric:        Mood and Affect: Mood normal.     ED Results / Procedures / Treatments   Labs (all labs ordered are listed, but only abnormal results are displayed) Labs Reviewed  COMPREHENSIVE METABOLIC PANEL - Abnormal; Notable for the following components:      Result Value   Potassium 3.1 (*)    Glucose, Bld 129 (*)    BUN 28 (*)    Creatinine, Ser 1.37 (*)    ALT 48 (*)    All other components within normal limits  CBC WITH DIFFERENTIAL/PLATELET - Abnormal; Notable for the following components:   WBC 15.4 (*)    RBC 3.65 (*)    Hemoglobin 9.9 (*)    HCT 27.6 (*)    MCV 75.6 (*)    RDW 20.4 (*)    nRBC 8.7 (*)    Neutro Abs 12.3 (*)    Monocytes Absolute 1.7 (*)    Abs Immature Granulocytes 0.26 (*)    All other components within normal limits  RETICULOCYTES - Abnormal; Notable for the following components:   Retic Ct Pct 6.5 (*)    RBC. 3.59 (*)    Retic Count, Absolute 234.8 (*)    Immature Retic Fract 48.3 (*)    All other components within normal limits  MAGNESIUM    EKG None  Radiology DG Chest 1 View  Result Date: 09/27/2022 CLINICAL DATA:  Generalized body aches. History of sickle cell disease. The patient's son recently had flu the patient thinks he may have it also. EXAM: CHEST  1  VIEW COMPARISON:  09/28/2016.  Chest CTA dated 09/26/2016. FINDINGS: Poor inspiration. Grossly normal sized heart with an interval decrease in size. Clear lungs. Patchy sclerosis in both humeral heads and thoracic spine. Progressive thoracic spine degenerative changes. Moderate left glenohumeral degenerative spur formation. IMPRESSION: 1. No acute abnormality. 2. 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 3. Patchy sclerosis in the thoracic spine, compatible with sickle cell changes. Electronically Signed   By: Beckie Salts M.D.   On: 09/27/2022 14:55    Procedures Procedures    Medications Ordered in ED Medications  HYDROmorphone (DILAUDID) injection 0.5 mg (0.5 mg Subcutaneous Given 09/27/22 1740)  potassium chloride SA (KLOR-CON M) CR tablet 40 mEq (40 mEq Oral Given 09/27/22 1742)  HYDROmorphone (DILAUDID) injection 0.5 mg (0.5 mg Subcutaneous Given 09/27/22 1815)    ED Course/ Medical Decision Making/ A&P                           Medical Decision Making Amount and/or Complexity of Data Reviewed Labs: ordered.  Risk Prescription drug management.    46 year old male with medical history significant for sickle cell disease who presents to the emergency department with generalized bodyaches for the last 2 days.  The patient was seen in the emergency department at [facility] but left due to the wait time.  He did have laboratory testing done at that time and tested positive for influenza.  He denies any chest pain or shortness of breath.  He endorses generalized body aches and feels that it is triggering a sickle cell pain flare.  He has not been able to take any pain medicine as he was in the waiting room all day yesterday.  He denies any fevers but had a subjective fever yesterday.  No chest pain, no cough, no shortness of breath.  On arrival, the patient was vitally stable, afebrile, not tachycardic or  tachypneic, saturating well on room air.  Physical exam significant for lungs clear to auscultation bilaterally.  Presenting with likely sickle cell pain in the setting of his influenza infection.  CMP significant for hypokalemia to 3.1, replenished orally, magnesium normal, CBC with a nonspecific leukocytosis to 15.4, anemia to 9.9 which is stable from previous measurements, reticulocyte count elevated.  The patient was administered subcutaneous Dilaudid 0.5 mg x 2 with subsequent resolution in his pain.  A chest x-ray was performed which revealed no evidence of bacterial pneumonia.  Patient symptoms are consistent with likely influenza infection triggering sickle cell related pain.  Given the patient's improvement in pain control and evidence of AKI on laboratory workup from yesterday and now today with some improvement, considered inpatient hospitalization but the patient feels improved, is able to orally rehydrate and plans to follow-up outpatient with the sickle cell clinic in [place] on Monday for laboratory recheck.    Final Clinical Impression(s) / ED Diagnoses Final diagnoses:  AKI (acute kidney injury) (HCC)  Influenza  Sickle cell anemia with pain (HCC)  Hypokalemia    Rx / DC Orders ED Discharge Orders          Ordered    oseltamivir (TAMIFLU) 75 MG capsule  Every 12 hours        09/27/22 1857              Ernie Avena, MD 09/27/22 1859

## 2022-09-27 NOTE — ED Triage Notes (Signed)
Pt presents with c/o generalized body aches. Pt has a hx of sickle cell disease and believes he has the flu because his son recently had the flu.

## 2022-09-27 NOTE — Discharge Instructions (Addendum)
Please follow-up with the sickle cell pain clinic on Monday if you continue to have pain and for recheck of your laboratory function.  We will treat your influenza with Tamiflu.  Continue to drink fluids to maintain hydration with Pedialyte or Gatorade.

## 2022-09-28 LAB — PATHOLOGIST SMEAR REVIEW

## 2022-10-01 ENCOUNTER — Telehealth: Payer: Self-pay

## 2022-10-01 NOTE — Telephone Encounter (Signed)
Transition Care Management Follow-up Telephone Call Date of discharge and from where: 09/27/2022 Nicolas [name]  How have you been since you were released from the hospital? Pt states he feels a lot than what he has felt.  Any questions or concerns? No  Items Reviewed: Did the pt receive and understand the discharge instructions provided? Yes  Medications obtained and verified? Yes  Other? Yes  Any new allergies since your discharge? No  Dietary orders reviewed? Yes Do you have support at home? Yes   Home Care and Equipment/Supplies: Were home health services ordered? no If so, what is the name of the agency? N/a  Has the agency set up a time to come to the patient's home? no Were any new equipment or medical supplies ordered?  No What is the name of the medical supply agency? N/a Were you able to get the supplies/equipment? no Do you have any questions related to the use of the equipment or supplies? No  Functional Questionnaire: (I = Independent and D = Dependent) ADLs: i  Bathing/Dressing- i  Meal Prep- i  Eating- i  Maintaining continence- i  Transferring/Ambulation- i  Managing Meds- i  Follow up appointments reviewed:  PCP Hospital f/u appt confirmed? Yes  Scheduled to see n/a on n/a @ n/a. [facility] f/u appt confirmed? No  Scheduled to see n/a on n/a @ n/a. Are transportation arrangements needed? No  If their condition worsens, is the pt aware to call PCP or go to the Emergency Dept.? Yes Was the patient provided with contact information for the PCP's office or ED? Yes Was to pt encouraged to call back with questions or concerns? Yes

## 2022-10-02 ENCOUNTER — Ambulatory Visit: Payer: Medicare Other | Admitting: Internal Medicine

## 2022-10-14 ENCOUNTER — Emergency Department (HOSPITAL_COMMUNITY): Payer: 59

## 2022-10-14 ENCOUNTER — Inpatient Hospital Stay (HOSPITAL_COMMUNITY)
Admission: EM | Admit: 2022-10-14 | Discharge: 2022-10-18 | DRG: 812 | Disposition: A | Discharge status: Home / Self Care | Payer: 59 | Attending: Internal Medicine | Admitting: Internal Medicine

## 2022-10-14 ENCOUNTER — Other Ambulatory Visit: Payer: Self-pay

## 2022-10-14 DIAGNOSIS — M8700 Idiopathic aseptic necrosis of unspecified bone: Secondary | ICD-10-CM | POA: Diagnosis present

## 2022-10-14 DIAGNOSIS — E876 Hypokalemia: Secondary | ICD-10-CM | POA: Diagnosis not present

## 2022-10-14 DIAGNOSIS — G894 Chronic pain syndrome: Secondary | ICD-10-CM | POA: Diagnosis present

## 2022-10-14 DIAGNOSIS — N179 Acute kidney failure, unspecified: Secondary | ICD-10-CM | POA: Diagnosis present

## 2022-10-14 DIAGNOSIS — I119 Hypertensive heart disease without heart failure: Secondary | ICD-10-CM | POA: Diagnosis not present

## 2022-10-14 DIAGNOSIS — Z7984 Long term (current) use of oral hypoglycemic drugs: Secondary | ICD-10-CM | POA: Diagnosis not present

## 2022-10-14 DIAGNOSIS — E669 Obesity, unspecified: Secondary | ICD-10-CM | POA: Diagnosis present

## 2022-10-14 DIAGNOSIS — D638 Anemia in other chronic diseases classified elsewhere: Secondary | ICD-10-CM | POA: Diagnosis not present

## 2022-10-14 DIAGNOSIS — Z800 Family history of malignant neoplasm of digestive organs: Secondary | ICD-10-CM | POA: Diagnosis not present

## 2022-10-14 DIAGNOSIS — R079 Chest pain, unspecified: Secondary | ICD-10-CM | POA: Diagnosis not present

## 2022-10-14 DIAGNOSIS — F1120 Opioid dependence, uncomplicated: Secondary | ICD-10-CM | POA: Diagnosis present

## 2022-10-14 DIAGNOSIS — D57219 Sickle-cell/Hb-C disease with crisis, unspecified: Secondary | ICD-10-CM | POA: Diagnosis present

## 2022-10-14 DIAGNOSIS — D5720 Sickle-cell/Hb-C disease without crisis: Secondary | ICD-10-CM | POA: Diagnosis present

## 2022-10-14 DIAGNOSIS — D649 Anemia, unspecified: Secondary | ICD-10-CM | POA: Diagnosis not present

## 2022-10-14 DIAGNOSIS — M879 Osteonecrosis, unspecified: Secondary | ICD-10-CM | POA: Diagnosis present

## 2022-10-14 DIAGNOSIS — E785 Hyperlipidemia, unspecified: Secondary | ICD-10-CM | POA: Diagnosis present

## 2022-10-14 DIAGNOSIS — Z79899 Other long term (current) drug therapy: Secondary | ICD-10-CM | POA: Diagnosis not present

## 2022-10-14 DIAGNOSIS — Z96642 Presence of left artificial hip joint: Secondary | ICD-10-CM | POA: Diagnosis not present

## 2022-10-14 DIAGNOSIS — E1122 Type 2 diabetes mellitus with diabetic chronic kidney disease: Secondary | ICD-10-CM

## 2022-10-14 DIAGNOSIS — E119 Type 2 diabetes mellitus without complications: Secondary | ICD-10-CM | POA: Diagnosis not present

## 2022-10-14 DIAGNOSIS — Z833 Family history of diabetes mellitus: Secondary | ICD-10-CM

## 2022-10-14 DIAGNOSIS — D5700 Hb-SS disease with crisis, unspecified: Secondary | ICD-10-CM | POA: Diagnosis not present

## 2022-10-14 DIAGNOSIS — R7401 Elevation of levels of liver transaminase levels: Secondary | ICD-10-CM | POA: Diagnosis not present

## 2022-10-14 DIAGNOSIS — R000 Tachycardia, unspecified: Secondary | ICD-10-CM | POA: Diagnosis not present

## 2022-10-14 DIAGNOSIS — N1831 Type 2 diabetes mellitus with diabetic chronic kidney disease: Secondary | ICD-10-CM

## 2022-10-14 LAB — CBC WITH DIFFERENTIAL/PLATELET
Abs Immature Granulocytes: 0.07 10*3/uL (ref 0.00–0.07)
Basophils Absolute: 0 10*3/uL (ref 0.0–0.1)
Basophils Relative: 0 %
Eosinophils Absolute: 0.2 10*3/uL (ref 0.0–0.5)
Eosinophils Relative: 2 %
HCT: 17 % — ABNORMAL LOW (ref 39.0–52.0)
Hemoglobin: 5.5 g/dL — CL (ref 13.0–17.0)
Immature Granulocytes: 1 %
Lymphocytes Relative: 13 %
Lymphs Abs: 1.8 10*3/uL (ref 0.7–4.0)
MCH: 23.6 pg — ABNORMAL LOW (ref 26.0–34.0)
MCHC: 32.4 g/dL (ref 30.0–36.0)
MCV: 73 fL — ABNORMAL LOW (ref 80.0–100.0)
Monocytes Absolute: 1.2 10*3/uL — ABNORMAL HIGH (ref 0.1–1.0)
Monocytes Relative: 9 %
Neutro Abs: 10 10*3/uL — ABNORMAL HIGH (ref 1.7–7.7)
Neutrophils Relative %: 75 %
Platelets: 645 10*3/uL — ABNORMAL HIGH (ref 150–400)
RBC: 2.33 MIL/uL — ABNORMAL LOW (ref 4.22–5.81)
RDW: 23.9 % — ABNORMAL HIGH (ref 11.5–15.5)
WBC: 13.3 10*3/uL — ABNORMAL HIGH (ref 4.0–10.5)
nRBC: 1.3 % — ABNORMAL HIGH (ref 0.0–0.2)

## 2022-10-14 LAB — COMPREHENSIVE METABOLIC PANEL
ALT: 53 U/L — ABNORMAL HIGH (ref 0–44)
AST: 48 U/L — ABNORMAL HIGH (ref 15–41)
Albumin: 2.5 g/dL — ABNORMAL LOW (ref 3.5–5.0)
Alkaline Phosphatase: 193 U/L — ABNORMAL HIGH (ref 38–126)
Anion gap: 12 (ref 5–15)
BUN: 15 mg/dL (ref 6–20)
CO2: 26 mmol/L (ref 22–32)
Calcium: 8.7 mg/dL — ABNORMAL LOW (ref 8.9–10.3)
Chloride: 101 mmol/L (ref 98–111)
Creatinine, Ser: 1.31 mg/dL — ABNORMAL HIGH (ref 0.61–1.24)
GFR, Estimated: >60 mL/min (ref >60)
Glucose, Bld: 154 mg/dL — ABNORMAL HIGH (ref 70–99)
Potassium: 3.1 mmol/L — ABNORMAL LOW (ref 3.5–5.1)
Sodium: 139 mmol/L (ref 135–145)
Total Bilirubin: 0.8 mg/dL (ref 0.3–1.2)
Total Protein: 7.3 g/dL (ref 6.5–8.1)

## 2022-10-14 LAB — RETICULOCYTES
Immature Retic Fract: 34.2 % — ABNORMAL HIGH (ref 2.3–15.9)
RBC.: 2.28 MIL/uL — ABNORMAL LOW (ref 4.22–5.81)
Retic Count, Absolute: 67.3 10*3/uL (ref 19.0–186.0)
Retic Ct Pct: 3 % (ref 0.4–3.1)

## 2022-10-14 MED ORDER — HYDROMORPHONE HCL 1 MG/ML IJ SOLN
1.0000 mg | Freq: Once | INTRAMUSCULAR | Status: AC
  Administered 2022-10-14: 1 mg via INTRAVENOUS
  Filled 2022-10-14: qty 1

## 2022-10-14 MED ORDER — ONDANSETRON HCL 4 MG/2ML IJ SOLN
4.0000 mg | Freq: Once | INTRAMUSCULAR | Status: AC
  Administered 2022-10-14: 4 mg via INTRAVENOUS
  Filled 2022-10-14: qty 2

## 2022-10-14 MED ORDER — DEXTROSE-NACL 5-0.45 % IV SOLN: INTRAVENOUS | Status: DC

## 2022-10-14 MED ORDER — SODIUM CHLORIDE 0.9% IV SOLUTION: Freq: Once | INTRAVENOUS | Status: AC

## 2022-10-14 NOTE — ED Notes (Signed)
Two save gold tube in main lab

## 2022-10-14 NOTE — ED Triage Notes (Signed)
C/o scc pain to back, bilateral legs, and bilateral arms Denies sob, cp.  Pt takes oxy at home for pain and is out of meds.  Seen in [place] for sickle cell.

## 2022-10-14 NOTE — H&P (Signed)
History and Physical    Patient: Nicolas [name] DOB: Dec 25, 1975 DOA: 10/14/2022 DOS: the patient was seen and examined on 10/14/2022 PCP: [name], MD  Patient coming from: Home  Chief Complaint:  Chief Complaint  Patient presents with   Sickle Cell Pain Crisis   HPI: Nicolas [name] is a 47 y.o. male with medical history significant of sickle cell [place] disease, diabetes, hypertension, hyperlipidemia, depression with anxiety who presented with pain in his legs and elbows as well as lower back.  Pain is rated as 10 out of 10.  Consistent with his typical sickle cell crisis.  Patient has taken his home regimen with no relief.  He also took treatment in the ER including Dilaudid.  Despite several doses did not get adequate relief.  His hemoglobin was noted to to be at 5.5.  Most recently it was 9.9 in December.  High suspicion for a plastic crisis from sickle cell.  Did not report any viral illness.  Patient has significant leukocytosis.  Other prominent findings is hypokalemia.  At this point he will be admitted to the hospital with acute sickle cell crisis.  Review of Systems: As mentioned in the history of present illness. All other systems reviewed and are negative. Past Medical History:  Diagnosis Date   Arthritis    arthritis- back & L hip   Depression    situational depression, pt. out of work    Diabetes mellitus ([place])    HTN (hypertension) 05/10/2015   Osteoarthritis of left hip 06/07/2012   Sickle cell anemia [facility])    Past Surgical History:  Procedure Laterality Date   IR GENERIC HISTORICAL  09/26/2016   IR FLUORO GUIDE CV LINE RIGHT 09/26/2016 Aletta Edouard, MD [facility]   IR GENERIC HISTORICAL  09/26/2016   IR US GUIDE VASC ACCESS RIGHT 09/26/2016 Aletta Edouard, MD [facility]INTER[facility]   JOINT REPLACEMENT     R hip   TOTAL HIP ARTHROPLASTY  06/07/2012   Procedure: TOTAL HIP ARTHROPLASTY;  Surgeon: Johnny Bridge, MD;  Location: [facility];  Service: Orthopedics;   Laterality: Left;   Social History:  reports that he has never smoked. He has never used smokeless tobacco. He reports that he does not currently use alcohol. He reports current drug use. Drug: Oxycodone.  No Known Allergies  Family History  Problem Relation Age of Onset   Cancer - Other Father    Liver cancer Father    Diabetes Mother    CAD Neg Hx     Prior to Admission medications   Medication Sig Start Date End Date Taking? Authorizing Provider  amLODipine (NORVASC) 10 MG tablet TAKE 1 TABLET BY MOUTH EVERY DAY 09/12/22   [name], MD  carvedilol (COREG) 6.25 MG tablet TAKE 1 TABLET BY MOUTH TWICE A DAY WITH MEALS 09/12/22   [name], MD  Cholecalciferol (VITAMIN D3) 2000 units capsule Take 2,000 Units by mouth daily. 02/05/16   [provider]  cyclobenzaprine (FLEXERIL) 10 MG tablet Take 10 mg by mouth 3 (three) times daily as needed for muscle spasms.    [provider]  diclofenac Sodium (VOLTAREN) 1 % GEL Apply 2 g topically 3 (three) times daily as needed. 06/19/21   [name], MD  folic acid (FOLVITE) 1 MG tablet Take 1 tablet (1 mg total) by mouth daily. 06/10/22   [name], FNP  glucose blood test strip Use as instructed Patient taking differently: 1 each by Other route every other day. Use  as instructed 08/15/13   Viyuoh, Adeline C, MD  ibuprofen (ADVIL) 800 MG tablet Take 1 tablet (800 mg total) by mouth every 8 (eight) hours as needed. 06/04/22   Fenton Foy, NP  Lancets (ACCU-CHEK MULTICLIX) lancets Use as instructed Patient taking differently: 1 each by Other route every other day. 08/15/13   Viyuoh, Alison Stalling, MD  metFORMIN (GLUCOPHAGE) 500 MG tablet TAKE 1 TABLET BY MOUTH TWICE A DAY 09/12/22   [name], MD  Multiple Vitamin (ONE-A-DAY MENS PO) Take by mouth. 1 daily    [provider]  oseltamivir (TAMIFLU) 75 MG capsule Take 1 capsule (75 mg total) by mouth every 12 (twelve) hours. 09/27/22   Regan Lemming,  MD  OXBRYTA 500 MG TABS tablet Take by mouth. 07/23/20   [provider]  oxyCODONE-acetaminophen (PERCOCET) 7.5-325 MG tablet Take 1 tablet by mouth every 4 (four) hours as needed for severe pain. 06/26/22   [name], MD  OZEMPIC, 0.25 OR 0.5 MG/DOSE, 2 MG/1.5ML SOPN INJECT 0.5 MG INTO THE SKIN ONCE A WEEK. 06/03/22   [name], MD  pravastatin (PRAVACHOL) 20 MG tablet TAKE 1 TABLET BY MOUTH EVERY DAY IN THE EVENING 08/26/22   [name], MD  triamterene-hydrochlorothiazide [facility]) 37.5-25 MG tablet TAKE 1 TABLET BY MOUTH EVERY DAY 09/12/22   [name], MD  valsartan (DIOVAN) 160 MG tablet Take 1 tablet (160 mg total) by mouth daily. 06/26/22 06/26/23  [name], MD    Physical Exam: Vitals:   10/14/22 1627 10/14/22 2214 10/14/22 2221  BP: (!) 156/98 (!) 135/90   Pulse: (!) 111 96   Resp: 20 (!) 23   Temp: 100.2 F (37.9 C)  99.8 F (37.7 C)  TempSrc: Oral  Oral  SpO2: 100% 98%    Constitutional: Acutely ill looking no distress NAD, calm, comfortable Eyes: PERRL, lids and conjunctivae normal ENMT: Mucous membranes are moist. Posterior pharynx clear of any exudate or lesions.Normal dentition.  Neck: normal, supple, no masses, no thyromegaly Respiratory: clear to auscultation bilaterally, no wheezing, no crackles. Normal respiratory effort. No accessory muscle use.  Cardiovascular: Sinus Tachycardia, no murmurs / rubs / gallops. No extremity edema. 2+ pedal pulses. No carotid bruits.  Abdomen: no tenderness, no masses palpated. No hepatosplenomegaly. Bowel sounds positive.  Musculoskeletal: Good range of motion, no joint swelling or tenderness, Skin: no rashes, lesions, ulcers. No induration Neurologic: CN 2-12 grossly intact. Sensation intact, DTR normal. Strength 5/5 in all 4.  Psychiatric: Normal judgment and insight. Alert and oriented x 3. Normal mood  Data Reviewed:  Sodium 139 potassium 3.9 chloride 100 CO2 26.  Glucose 154, creatinine 1.31  and calcium 8.7.  Alkaline phosphatase 193 albumin 2.5.  AST 48 ALT 53 white count 13.3 hemoglobin 5.5 platelets 645.  Lactate 3.0.  Immature reticulocyte fraction is 34.2.  Chest pressure no acute findings  Assessment and Plan:  #1 sickle cell anemia with crisis: Patient will be placed on Dilaudid PCA, Toradol, IV fluids.  Continue to assess pain.  #2 symptomatic anemia: Will transfuse 1 unit of packed red blood cells.  Monitor H&H.  Most likely hemolytic crisis.  Check LDH.  Aplastic crisis possible also.  #3 diabetes: Sliding scale insulin.  #4 essential hypertension: Resume home regimen.  #5 hypokalemia: Replete potassium.  #6 increased LFTs: Probably reactive.  Monitor closely.    Advance Care Planning:   Code Status: Full Code   Consults: None  Family Communication: No family at bedside  Severity of Illness: The  appropriate patient status for this patient is INPATIENT. Inpatient status is judged to be reasonable and necessary in order to provide the required intensity of service to ensure the patient's safety. The patient's presenting symptoms, physical exam findings, and initial radiographic and laboratory data in the context of their chronic comorbidities is felt to place them at high risk for further clinical deterioration. Furthermore, it is not anticipated that the patient will be medically stable for discharge from the hospital within 2 midnights of admission.   * I certify that at the point of admission it is my clinical judgment that the patient will require inpatient hospital care spanning beyond 2 midnights from the point of admission due to high intensity of service, high risk for further deterioration and high frequency of surveillance required.*  AuthorLonia Blood, MD 10/14/2022 11:08 PM  For on call review [email or web address].

## 2022-10-14 NOTE — ED Provider Triage Note (Signed)
Emergency Medicine Provider Triage Evaluation Note  Nicolas [name] , a 47 y.o. male  was evaluated in triage.  Pt complains of concerns for sickle cell pain crisis.  Notes that he typically takes oxycodone for his sickle cell pain with his last dose being 3 days ago.  He is out of his oxycodone at this time.  [facility] as well as with [place] in [place].  His sickle cell pain is typically localized to his back, bilateral arms, bilateral legs.  Denies chest pain, shortness of breath, [place] pain, nausea, vomiting.  Review of Systems  Positive:  Negative:   Physical Exam  BP (!) 156/98 (BP Location: Right Arm)   Pulse (!) 111   Temp 100.2 F (37.9 C) (Oral)   Resp 20   SpO2 100%  Gen:   Awake, no distress   Resp:  Normal effort  MSK:   Moves extremities without difficulty  Other:  No chest wall tenderness to palpation.  Tenderness to palpation noted to bilateral forearms without overlying skin changes.  Bilateral lower extremities with tenderness to palpation without overlying skin changes.  Full range of motion of bilateral upper and lower extremities.  Medical Decision Making  Medically screening exam initiated at 4:32 PM.  Appropriate orders placed.  Nicolas [name] was informed that the remainder of the evaluation will be completed by another provider, this initial triage assessment does not replace that evaluation, and the importance of remaining in the ED until their evaluation is complete.    Aleiah Mohammed A, PA-C 10/14/22 1636

## 2022-10-14 NOTE — ED Provider Notes (Signed)
[facility]-EMERGENCY DEPT Provider Note   CSN: [id number] Arrival date & time: 10/14/22  1606     History  Chief Complaint  Patient presents with   Sickle Cell Pain Crisis    Nicolas [name] is a 47 y.o. male.  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  He reports having pain in his low back, bilateral lower extremities, and joints in his arms.  He usually takes oxycodone for his sickle cell pain, but he ran out of this medication a few days ago.  Reports that his pain has been constant for the past 2 weeks, since being diagnosed with influenza.  He denies known fevers as well as associated chest pain, shortness of breath, abdominal pain, nausea, vomiting.  Reports that he typically has sickle cell crisis pain in the areas affected today.  The history is provided by the patient. No language interpreter was used.  Sickle Cell Pain Crisis      Home Medications Prior to Admission medications   Medication Sig Start Date End Date Taking? Authorizing Provider  amLODipine (NORVASC) 10 MG tablet TAKE 1 TABLET BY MOUTH EVERY DAY 09/12/22   Dorothyann Peng, MD  carvedilol (COREG) 6.25 MG tablet TAKE 1 TABLET BY MOUTH TWICE A DAY WITH MEALS 09/12/22   Dorothyann Peng, MD  Cholecalciferol (VITAMIN D3) 2000 units capsule Take 2,000 Units by mouth daily. 02/05/16   [provider]  cyclobenzaprine (FLEXERIL) 10 MG tablet Take 10 mg by mouth 3 (three) times daily as needed for muscle spasms.    [provider]  diclofenac Sodium (VOLTAREN) 1 % GEL Apply 2 g topically 3 (three) times daily as needed. 06/19/21   Dorothyann Peng, MD  folic acid (FOLVITE) 1 MG tablet Take 1 tablet (1 mg total) by mouth daily. 06/10/22   Massie Maroon, FNP  glucose blood test strip Use as instructed Patient taking differently: 1 each by Other route every other day. Use as instructed 08/15/13   Viyuoh,  Adeline C, MD  ibuprofen (ADVIL) 800 MG tablet Take 1 tablet (800 mg total) by mouth every 8 (eight) hours as needed. 06/04/22   Ivonne Andrew, NP  Lancets (ACCU-CHEK MULTICLIX) lancets Use as instructed Patient taking differently: 1 each by Other route every other day. 08/15/13   Viyuoh, Rolland Bimler, MD  metFORMIN (GLUCOPHAGE) 500 MG tablet TAKE 1 TABLET BY MOUTH TWICE A DAY 09/12/22   Dorothyann Peng, MD  Multiple Vitamin (ONE-A-DAY MENS PO) Take by mouth. 1 daily    [provider]  oseltamivir (TAMIFLU) 75 MG capsule Take 1 capsule (75 mg total) by mouth every 12 (twelve) hours. 09/27/22   Ernie Avena, MD  OXBRYTA 500 MG TABS tablet Take by mouth. 07/23/20   [provider]  oxyCODONE-acetaminophen (PERCOCET) 7.5-325 MG tablet Take 1 tablet by mouth every 4 (four) hours as needed for severe pain. 06/26/22   Dorothyann Peng, MD  OZEMPIC, 0.25 OR 0.5 MG/DOSE, 2 MG/1.5ML SOPN INJECT 0.5 MG INTO THE SKIN ONCE A WEEK. 06/03/22   Dorothyann Peng, MD  pravastatin (PRAVACHOL) 20 MG tablet TAKE 1 TABLET BY MOUTH EVERY DAY IN THE EVENING 08/26/22   Dorothyann Peng, MD  triamterene-hydrochlorothiazide [facility]) 37.5-25 MG tablet TAKE 1 TABLET BY MOUTH EVERY DAY 09/12/22   Dorothyann Peng, MD  valsartan (DIOVAN) 160 MG tablet Take 1 tablet (160 mg total) by mouth daily. 06/26/22 06/26/23  Dorothyann Peng, MD  Allergies    Patient has no known allergies.    Review of Systems   Review of Systems Ten systems reviewed and are negative for acute change, except as noted in the HPI.    Physical Exam Updated Vital Signs BP (!) 135/90   Pulse 96   Temp 99.8 F (37.7 C) (Oral)   Resp (!) 23   SpO2 98%   Physical Exam Vitals and nursing note reviewed.  Constitutional:      General: He is not in acute distress.    Appearance: He is well-developed. He is not diaphoretic.     Comments: Ill appearing, nontoxic.  HENT:     Head: Normocephalic and atraumatic.  Eyes:     General: No  scleral icterus.    Conjunctiva/sclera: Conjunctivae normal.  Cardiovascular:     Rate and Rhythm: Regular rhythm. Tachycardia present.     Pulses: Normal pulses.     Comments: Mild tachycardia Pulmonary:     Effort: Pulmonary effort is normal. No respiratory distress.     Breath sounds: No stridor. No wheezing.     Comments: Respirations even and unlabored. Lungs CTAB. Abdominal:     General: There is no distension.     Palpations: Abdomen is soft.     Tenderness: There is no abdominal tenderness. There is no guarding.     Comments: Soft, nondistended, nontender  Musculoskeletal:        General: Normal range of motion.     Cervical back: Normal range of motion.     Comments: Small degree of olecranon bursitis of the R elbow without erythema, heat to touch. Normal ROM of all extremities.  Skin:    General: Skin is warm and dry.     Coloration: Skin is not pale.     Findings: No erythema or rash.  Neurological:     Mental Status: He is alert and oriented to person, place, and time.     Coordination: Coordination normal.  Psychiatric:        Behavior: Behavior normal.     ED Results / Procedures / Treatments   Labs (all labs ordered are listed, but only abnormal results are displayed) Labs Reviewed  CBC WITH DIFFERENTIAL/PLATELET - Abnormal; Notable for the following components:      Result Value   WBC 13.3 (*)    RBC 2.33 (*)    Hemoglobin 5.5 (*)    HCT 17.0 (*)    MCV 73.0 (*)    MCH 23.6 (*)    RDW 23.9 (*)    Platelets 645 (*)    nRBC 1.3 (*)    Neutro Abs 10.0 (*)    Monocytes Absolute 1.2 (*)    All other components within normal limits  RETICULOCYTES - Abnormal; Notable for the following components:   RBC. 2.28 (*)    Immature Retic Fract 34.2 (*)    All other components within normal limits  COMPREHENSIVE METABOLIC PANEL - Abnormal; Notable for the following components:   Potassium 3.1 (*)    Glucose, Bld 154 (*)    Creatinine, Ser 1.31 (*)    Calcium  8.7 (*)    Albumin 2.5 (*)    AST 48 (*)    ALT 53 (*)    Alkaline Phosphatase 193 (*)    All other components within normal limits  TYPE AND SCREEN  PREPARE RBC (CROSSMATCH)    EKG EKG Interpretation  Date/Time:  Tuesday October 14 2022 22:23:08 EST Ventricular Rate:  95  PR Interval:  174 QRS Duration: 86 QT Interval:  360 QTC Calculation: 453 R Axis:   22 Text Interpretation: Sinus rhythm unchanged from 09/23/2016 Confirmed by Geoffery Lyons ([id number]) on 10/14/2022 11:25:31 PM  Radiology DG Chest Port 1 View  Result Date: 10/14/2022 CLINICAL DATA:  Sickle cell pain crisis EXAM: PORTABLE CHEST 1 VIEW COMPARISON:  09/27/2022 FINDINGS: Thoracic spondylosis. Abnormal sclerosis in both proximal humeral heads and metaphyses compatible with chronic bony findings from sickle cell disease. The lungs appear clear. No blunting of the costophrenic angles. Heart size within normal limits. IMPRESSION: 1. No active cardiopulmonary disease is radiographically apparent. 2. Bony findings of sickle cell disease. 3. Thoracic spondylosis. Electronically Signed   By: Gaylyn Rong M.D.   On: 10/14/2022 17:14    Procedures .Critical Care  Performed by: Antony Madura, PA-C Authorized by: Antony Madura, PA-C   Critical care provider statement:    Critical care time (minutes):  45   Critical care time was exclusive of:  Separately billable procedures and treating other patients   Critical care was necessary to treat or prevent imminent or life-threatening deterioration of the following conditions:  Circulatory failure (acute on chronic anemia; sickle cell crisis)   Critical care was time spent personally by me on the following activities:  Development of treatment plan with patient or surrogate, discussions with consultants, evaluation of patient's response to treatment, examination of patient, ordering and review of laboratory studies, ordering and review of radiographic studies, ordering and performing  treatments and interventions, pulse oximetry, re-evaluation of patient's condition and review of old charts   I assumed direction of critical care for this patient from another provider in my specialty: no     Care discussed with: admitting provider       Medications Ordered in ED Medications  dextrose 5 %-0.45 % sodium chloride infusion ( Intravenous New Bag/Given 10/14/22 2304)  0.9 %  sodium chloride infusion (Manually program via Guardrails IV Fluids) (has no administration in time range)  HYDROmorphone (DILAUDID) injection 1 mg (1 mg Intravenous Given 10/14/22 2303)  ondansetron (ZOFRAN) injection 4 mg (4 mg Intravenous Given 10/14/22 2303)    ED Course/ Medical Decision Making/ A&P Clinical Course as of 10/14/22 2324  Tue Oct 14, 2022  2304 Case discussed with Dr. Julian Reil who will admit. [KH]    Clinical Course User Index [KH] Antony Madura, PA-C                             Medical Decision Making Amount and/or Complexity of Data Reviewed Labs: ordered.  Risk Prescription drug management. Decision regarding hospitalization.   This patient presents to the ED for concern of myalgias, this involves an extensive number of treatment options, and is a complaint that carries with it a high risk of complications and morbidity.  The differential diagnosis includes viral illness vs vaso occlusive crisis vs rhabdomyolysis  Co morbidities that complicate the patient evaluation  Sickle cell [place] anemia   Additional history obtained:  Additional history obtained from family at bedside External records from outside source obtained and reviewed including historical Hgb values; baseline 10.5-12   Lab Tests:  I Ordered, and personally interpreted labs.  The pertinent results include:  Hgb of 5.5, Leukocytosis of 13.3 (c/w historical baseline), Reticulocyte count of 67.3, mild transaminitis (AST 48, ALT 53), Creatinine 1.31 (c/w historical baseline)   Imaging Studies ordered:  I  ordered imaging studies including CXR  I independently  visualized and interpreted imaging which showed no acute cardiopulmonary abnormality. No evidence of acute chest syndrome I agree with the radiologist interpretation   Cardiac Monitoring:  The patient was maintained on a cardiac monitor.  I personally viewed and interpreted the cardiac monitored which showed an underlying rhythm of: mild sinus tachycardia; HR 95-105bpm   Medicines ordered and prescription drug management:  I ordered medication including Dilaudid for pain and Zofran for nausea  Reevaluation of the patient after these medicines showed that the patient  remained stable I have reviewed the patients home medicines and have made adjustments as needed   Test Considered:  Type and screen   Critical Interventions:  Blood transfusion ordered given Hgb of 5.5 which is half of patient's historical baseline.   Consultations Obtained:  I requested consultation with Dr. Jonelle Sidle of [facility] and discussed lab and imaging findings as well as pertinent plan - they agree with plan for admission   Problem List / ED Course:  Patient presenting for 2 weeks of myalgias in the low back, bilateral lower extremities, upper extremity joints.  Patient reports that pain feels similar to past episodes of sickle cell crisis. Patient treated with fluid infusion as well as IV Dilaudid.  Was noted to be tachycardic in triage with a temperature of 100.2 F.  SIRS response favored to be secondary to acute pain crisis rather than infection.  Chest x-ray negative for signs of acute chest syndrome.  He has a leukocytosis of 13.3 which is actually improved from his historical baseline. Upon review of the patient's labs, his hemoglobin was noted to be 5.5.  His hemoglobin is usually between 10.5 and 12 at baseline.  His reticulocyte count is noted to be normal.  There is concern for aplastic crisis in this patient.  He has been ordered to receive transfusion  of 1 unit PRBCs. Case discussed with Dr. Jonelle Sidle of hospitalist service who will admit the patient for ongoing management.   Reevaluation:  After the interventions noted above, I reevaluated the patient and found that they have : remained stable   Social Determinants of Health:  Insured patient Good social support; family at bedside.   Dispostion:  After consideration of the diagnostic results and the patients response to treatment, I feel that the patent would benefit from admission for management of acute sickle cell crisis as well as acute on chronic anemia.          Final Clinical Impression(s) / ED Diagnoses Final diagnoses:  Sickle cell anemia with crisis ([place])  Acute on chronic anemia    Rx / DC Orders ED Discharge Orders     None         Antonietta Breach, PA-C 10/14/22 2326    Veryl Speak, MD 10/15/22 [phone]

## 2022-10-15 ENCOUNTER — Encounter (HOSPITAL_COMMUNITY): Payer: Self-pay | Admitting: Internal Medicine

## 2022-10-15 DIAGNOSIS — D5700 Hb-SS disease with crisis, unspecified: Secondary | ICD-10-CM

## 2022-10-15 LAB — CBC
HCT: 16.6 % — ABNORMAL LOW (ref 39.0–52.0)
Hemoglobin: 5.5 g/dL — CL (ref 13.0–17.0)
MCH: 24.2 pg — ABNORMAL LOW (ref 26.0–34.0)
MCHC: 33.1 g/dL (ref 30.0–36.0)
MCV: 73.1 fL — ABNORMAL LOW (ref 80.0–100.0)
Platelets: 602 10*3/uL — ABNORMAL HIGH (ref 150–400)
RBC: 2.27 MIL/uL — ABNORMAL LOW (ref 4.22–5.81)
RDW: 24.1 % — ABNORMAL HIGH (ref 11.5–15.5)
WBC: 12.8 10*3/uL — ABNORMAL HIGH (ref 4.0–10.5)
nRBC: 1.6 % — ABNORMAL HIGH (ref 0.0–0.2)

## 2022-10-15 LAB — CBC WITH DIFFERENTIAL/PLATELET
Abs Immature Granulocytes: 0.05 10*3/uL (ref 0.00–0.07)
Basophils Absolute: 0 10*3/uL (ref 0.0–0.1)
Basophils Relative: 0 %
Eosinophils Absolute: 0.4 10*3/uL (ref 0.0–0.5)
Eosinophils Relative: 3 %
HCT: 18.8 % — ABNORMAL LOW (ref 39.0–52.0)
Hemoglobin: 6.2 g/dL — CL (ref 13.0–17.0)
Immature Granulocytes: 1 %
Lymphocytes Relative: 17 %
Lymphs Abs: 1.8 10*3/uL (ref 0.7–4.0)
MCH: 24.7 pg — ABNORMAL LOW (ref 26.0–34.0)
MCHC: 33 g/dL (ref 30.0–36.0)
MCV: 74.9 fL — ABNORMAL LOW (ref 80.0–100.0)
Monocytes Absolute: 1.1 10*3/uL — ABNORMAL HIGH (ref 0.1–1.0)
Monocytes Relative: 10 %
Neutro Abs: 7.3 10*3/uL (ref 1.7–7.7)
Neutrophils Relative %: 69 %
Platelets: 541 10*3/uL — ABNORMAL HIGH (ref 150–400)
RBC: 2.51 MIL/uL — ABNORMAL LOW (ref 4.22–5.81)
RDW: 23.6 % — ABNORMAL HIGH (ref 11.5–15.5)
WBC: 10.6 10*3/uL — ABNORMAL HIGH (ref 4.0–10.5)
nRBC: 2.1 % — ABNORMAL HIGH (ref 0.0–0.2)

## 2022-10-15 LAB — GLUCOSE, CAPILLARY
Glucose-Capillary: 107 mg/dL — ABNORMAL HIGH (ref 70–99)
Glucose-Capillary: 140 mg/dL — ABNORMAL HIGH (ref 70–99)

## 2022-10-15 LAB — LACTATE DEHYDROGENASE: LDH: 243 U/L — ABNORMAL HIGH (ref 98–192)

## 2022-10-15 LAB — HEMOGLOBIN AND HEMATOCRIT, BLOOD
HCT: 20.2 % — ABNORMAL LOW (ref 39.0–52.0)
Hemoglobin: 6.7 g/dL — CL (ref 13.0–17.0)

## 2022-10-15 LAB — COMPREHENSIVE METABOLIC PANEL
ALT: 44 U/L (ref 0–44)
AST: 32 U/L (ref 15–41)
Albumin: 2.3 g/dL — ABNORMAL LOW (ref 3.5–5.0)
Alkaline Phosphatase: 158 U/L — ABNORMAL HIGH (ref 38–126)
Anion gap: 8 (ref 5–15)
BUN: 20 mg/dL (ref 6–20)
CO2: 27 mmol/L (ref 22–32)
Calcium: 8 mg/dL — ABNORMAL LOW (ref 8.9–10.3)
Chloride: 103 mmol/L (ref 98–111)
Creatinine, Ser: 1.63 mg/dL — ABNORMAL HIGH (ref 0.61–1.24)
GFR, Estimated: 51 mL/min — ABNORMAL LOW (ref >60)
Glucose, Bld: 168 mg/dL — ABNORMAL HIGH (ref 70–99)
Potassium: 3.2 mmol/L — ABNORMAL LOW (ref 3.5–5.1)
Sodium: 138 mmol/L (ref 135–145)
Total Bilirubin: 0.7 mg/dL (ref 0.3–1.2)
Total Protein: 6.6 g/dL (ref 6.5–8.1)

## 2022-10-15 LAB — CBG MONITORING, ED
Glucose-Capillary: 145 mg/dL — ABNORMAL HIGH (ref 70–99)
Glucose-Capillary: 142 mg/dL — ABNORMAL HIGH (ref 70–99)

## 2022-10-15 LAB — PREPARE RBC (CROSSMATCH)

## 2022-10-15 LAB — HIV ANTIBODY (ROUTINE TESTING W REFLEX): HIV Screen 4th Generation wRfx: NONREACTIVE

## 2022-10-15 LAB — RETICULOCYTES
Immature Retic Fract: 49.2 % — ABNORMAL HIGH (ref 2.3–15.9)
RBC.: 2.69 MIL/uL — ABNORMAL LOW (ref 4.22–5.81)
Retic Count, Absolute: 54.1 10*3/uL (ref 19.0–186.0)
Retic Ct Pct: 2 % (ref 0.4–3.1)

## 2022-10-15 LAB — CREATININE, SERUM
Creatinine, Ser: 1.42 mg/dL — ABNORMAL HIGH (ref 0.61–1.24)
GFR, Estimated: >60 mL/min (ref >60)

## 2022-10-15 MED ORDER — POLYETHYLENE GLYCOL 3350 17 G PO PACK: 17.0000 g | PACK | Freq: Every day | ORAL | Status: DC | PRN

## 2022-10-15 MED ORDER — DIPHENHYDRAMINE HCL 50 MG/ML IJ SOLN
25.0000 mg | Freq: Once | INTRAMUSCULAR | Status: AC
  Administered 2022-10-15: 25 mg via INTRAVENOUS
  Filled 2022-10-15: qty 1

## 2022-10-15 MED ORDER — DIPHENHYDRAMINE HCL 25 MG PO CAPS: 25.0000 mg | ORAL_CAPSULE | ORAL | Status: DC | PRN

## 2022-10-15 MED ORDER — HYDROMORPHONE 1 MG/ML IV SOLN
INTRAVENOUS | Status: DC
  Administered 2022-10-15: 30 mg via INTRAVENOUS
  Administered 2022-10-15: 1.5 mg via INTRAVENOUS
  Administered 2022-10-15: 3 mg via INTRAVENOUS
  Administered 2022-10-16: 2.5 mg via INTRAVENOUS
  Administered 2022-10-16: 0.5 mg via INTRAVENOUS
  Filled 2022-10-15: qty 30

## 2022-10-15 MED ORDER — HYDROMORPHONE HCL 1 MG/ML IJ SOLN
0.5000 mg | INTRAMUSCULAR | Status: AC | PRN
  Administered 2022-10-15 (×3): 1 mg via INTRAVENOUS
  Filled 2022-10-15 (×3): qty 1

## 2022-10-15 MED ORDER — ONDANSETRON HCL 4 MG/2ML IJ SOLN: 4.0000 mg | Freq: Four times a day (QID) | INTRAMUSCULAR | Status: DC | PRN

## 2022-10-15 MED ORDER — INSULIN ASPART 100 UNIT/ML IJ SOLN
0.0000 [IU] | Freq: Three times a day (TID) | INTRAMUSCULAR | Status: DC
  Administered 2022-10-17: 2 [IU] via SUBCUTANEOUS
  Administered 2022-10-17 – 2022-10-18 (×2): 3 [IU] via SUBCUTANEOUS
  Filled 2022-10-15: qty 0.15

## 2022-10-15 MED ORDER — SENNOSIDES-DOCUSATE SODIUM 8.6-50 MG PO TABS
1.0000 | ORAL_TABLET | Freq: Two times a day (BID) | ORAL | Status: DC
  Administered 2022-10-15 – 2022-10-17 (×6): 1 via ORAL
  Filled 2022-10-15 (×8): qty 1

## 2022-10-15 MED ORDER — PRAVASTATIN SODIUM 20 MG PO TABS
20.0000 mg | ORAL_TABLET | Freq: Every day | ORAL | Status: DC
  Administered 2022-10-15 – 2022-10-17 (×3): 20 mg via ORAL
  Filled 2022-10-15 (×3): qty 1

## 2022-10-15 MED ORDER — AMLODIPINE BESYLATE 10 MG PO TABS
10.0000 mg | ORAL_TABLET | Freq: Every day | ORAL | Status: DC
  Administered 2022-10-15 – 2022-10-18 (×4): 10 mg via ORAL
  Filled 2022-10-15: qty 2
  Filled 2022-10-15 (×3): qty 1

## 2022-10-15 MED ORDER — KETOROLAC TROMETHAMINE 15 MG/ML IJ SOLN
15.0000 mg | Freq: Four times a day (QID) | INTRAMUSCULAR | Status: DC
  Administered 2022-10-15 (×3): 15 mg via INTRAVENOUS
  Filled 2022-10-15 (×3): qty 1

## 2022-10-15 MED ORDER — INSULIN ASPART 100 UNIT/ML IJ SOLN
0.0000 [IU] | Freq: Every day | INTRAMUSCULAR | Status: DC
  Filled 2022-10-15: qty 0.05

## 2022-10-15 MED ORDER — ACETAMINOPHEN 325 MG PO TABS
650.0000 mg | ORAL_TABLET | Freq: Once | ORAL | Status: AC
  Administered 2022-10-15: 650 mg via ORAL
  Filled 2022-10-15: qty 2

## 2022-10-15 MED ORDER — NALOXONE HCL 0.4 MG/ML IJ SOLN: 0.4000 mg | INTRAMUSCULAR | Status: DC | PRN

## 2022-10-15 MED ORDER — ONDANSETRON HCL 4 MG/2ML IJ SOLN: 4.0000 mg | INTRAMUSCULAR | Status: DC | PRN

## 2022-10-15 MED ORDER — ENOXAPARIN SODIUM 40 MG/0.4ML IJ SOSY
40.0000 mg | PREFILLED_SYRINGE | INTRAMUSCULAR | Status: DC
  Administered 2022-10-15 – 2022-10-18 (×4): 40 mg via SUBCUTANEOUS
  Filled 2022-10-15 (×4): qty 0.4

## 2022-10-15 MED ORDER — SODIUM CHLORIDE 0.45 % IV SOLN: INTRAVENOUS | Status: DC

## 2022-10-15 MED ORDER — SODIUM CHLORIDE 0.9% FLUSH: 9.0000 mL | INTRAVENOUS | Status: DC | PRN

## 2022-10-15 MED ORDER — ONDANSETRON HCL 4 MG PO TABS: 4.0000 mg | ORAL_TABLET | ORAL | Status: DC | PRN

## 2022-10-15 NOTE — TOC Progression Note (Signed)
Transition of Care [facility]) - Progression Note    Patient Details  Name: Reason Helzer MRN: [id number] Date of Birth: 01/20/1976  [facility] CM/SW [place], RN Phone Number:[phone]  10/15/2022, 4:14 PM  Clinical Narrative:     Transition of Care (TOC) Screening Note   Patient Details  Name: Jordon Kristiansen Date of Birth: 09-13-1976   Transition of Care [facility]) CM/SW Contact:    Angelita Ingles, RN Phone Number: 10/15/2022, 4:15 PM    Transition of Care Department (TOC) has reviewed patient and no TOC needs have been identified at this time. We will continue to monitor patient advancement through interdisciplinary progression rounds. If new patient transition needs arise, please place a TOC consult.          Expected Discharge Plan and Services                                               Social Determinants of Health (SDOH) Interventions SDOH Screenings   Food Insecurity: No Food Insecurity (02/26/2022)  Transportation Needs: No Transportation Needs (02/26/2022)  Depression (PHQ2-9): Low Risk  (02/26/2022)  Financial Resource Strain: Low Risk  (02/26/2022)  Physical Activity: Sufficiently Active (02/26/2022)  Stress: No Stress Concern Present (02/26/2022)  Tobacco Use: Low Risk  (10/15/2022)    Readmission Risk Interventions     No data to display

## 2022-10-15 NOTE — ED Notes (Signed)
ED TO INPATIENT HANDOFF REPORT  ED Nurse Name and Phone #: Delice Bison, RN  S Name/Age/Gender Barkley Boards 47 y.o. male Room/Bed: WA13/WA13  Code Status   Code Status: Full Code  Home/SNF/Other Home Patient oriented to: self, place, time, and situation Is this baseline? Yes   Triage Complete: Triage complete  Chief Complaint Sickle cell anemia with crisis (HCC) [D57.00]  Triage Note C/o scc pain to back, bilateral legs, and bilateral arms Denies sob, cp.  Pt takes oxy at home for pain and is out of meds.  Seen in [place] for sickle cell.    Allergies No Known Allergies  Level of Care/Admitting Diagnosis ED Disposition     ED Disposition  Admit   Condition  --   Comment  Hospital Area: [facility] [[id number]]  Level of Care: Med-Surg [[id number]]  May admit patient to [facility] or [facility] if equivalent level of care is available:: No  Covid Evaluation: Confirmed COVID Negative  Diagnosis: Sickle cell anemia with crisis [facility]) [[id number]]  Admitting Physician: Rometta Emery [[id number]]  Attending Physician: Rometta Emery [[id number]]  Certification:: I certify this patient will need inpatient services for at least 2 midnights  Estimated Length of Stay: 4          B Medical/Surgery History Past Medical History:  Diagnosis Date   Arthritis    arthritis- back & L hip   Depression    situational depression, pt. out of work    Diabetes mellitus (HCC)    HTN (hypertension) 05/10/2015   Osteoarthritis of left hip 06/07/2012   Sickle cell anemia (HCC)    Past Surgical History:  Procedure Laterality Date   IR GENERIC HISTORICAL  09/26/2016   IR FLUORO GUIDE CV LINE RIGHT 09/26/2016 Irish Lack, MD [facility]   IR GENERIC HISTORICAL  09/26/2016   IR US GUIDE VASC ACCESS RIGHT 09/26/2016 Irish Lack, MD [facility]   JOINT REPLACEMENT     R hip   TOTAL HIP ARTHROPLASTY  06/07/2012   Procedure: TOTAL HIP ARTHROPLASTY;  Surgeon: Eulas Post, MD;   Location: [facility] OR;  Service: Orthopedics;  Laterality: Left;     A IV Location/Drains/Wounds Patient Lines/Drains/Airways Status     Active Line/Drains/Airways     Name Placement date Placement time Site Days   Peripheral IV 10/15/22 20 G Left Antecubital 10/15/22  0106  Antecubital  less than 1            Intake/Output Last 24 hours  Intake/Output Summary (Last 24 hours) at 10/15/2022 1406 Last data filed at 10/15/2022 0535 Gross per 24 hour  Intake 730.03 ml  Output --  Net 730.03 ml    Labs/Imaging Results for orders placed or performed during the hospital encounter of 10/14/22 (from the past 48 hour(s))  CBC WITH DIFFERENTIAL     Status: Abnormal   Collection Time: 10/14/22  4:35 PM  Result Value Ref Range   WBC 13.3 (H) 4.0 - 10.5 K/uL   RBC 2.33 (L) 4.22 - 5.81 MIL/uL   Hemoglobin 5.5 (LL) 13.0 - 17.0 g/dL    Comment: REPEATED TO VERIFY Reticulocyte Hemoglobin testing may be clinically indicated, consider ordering this additional test [id number] THIS CRITICAL RESULT HAS VERIFIED AND BEEN CALLED TO RN C KISER BY ALEXIS CRUICKSHANK ON 01 16 2024 AT 1742, AND HAS BEEN READ BACK.     HCT 17.0 (L) 39.0 - 52.0 %   MCV 73.0 (L) 80.0 - 100.0 fL   MCH  23.6 (L) 26.0 - 34.0 pg   MCHC 32.4 30.0 - 36.0 g/dL   RDW 19.5 (H) 09.3 - 26.7 %   Platelets 645 (H) 150 - 400 K/uL   nRBC 1.3 (H) 0.0 - 0.2 %   Neutrophils Relative % 75 %   Neutro Abs 10.0 (H) 1.7 - 7.7 K/uL   Lymphocytes Relative 13 %   Lymphs Abs 1.8 0.7 - 4.0 K/uL   Monocytes Relative 9 %   Monocytes Absolute 1.2 (H) 0.1 - 1.0 K/uL   Eosinophils Relative 2 %   Eosinophils Absolute 0.2 0.0 - 0.5 K/uL   Basophils Relative 0 %   Basophils Absolute 0.0 0.0 - 0.1 K/uL   Immature Granulocytes 1 %   Abs Immature Granulocytes 0.07 0.00 - 0.07 K/uL   Target Cells PRESENT     Comment: Performed at [facility], [address] [place], [place] [address]  Reticulocytes     Status: Abnormal    Collection Time: 10/14/22  4:35 PM  Result Value Ref Range   Retic Ct Pct 3.0 0.4 - 3.1 %   RBC. 2.28 (L) 4.22 - 5.81 MIL/uL   Retic Count, Absolute 67.3 19.0 - 186.0 K/uL   Immature Retic Fract 34.2 (H) 2.3 - 15.9 %    Comment: Performed at [facility], [address] [place], [place] [address]  Comprehensive metabolic panel     Status: Abnormal   Collection Time: 10/14/22  4:35 PM  Result Value Ref Range   Sodium 139 135 - 145 mmol/L   Potassium 3.1 (L) 3.5 - 5.1 mmol/L   Chloride 101 98 - 111 mmol/L   CO2 26 22 - 32 mmol/L   Glucose, Bld 154 (H) 70 - 99 mg/dL    Comment: Glucose reference range applies only to samples taken after fasting for at least 8 hours.   BUN 15 6 - 20 mg/dL   Creatinine, Ser 3.82 (H) 0.61 - 1.24 mg/dL   Calcium 8.7 (L) 8.9 - 10.3 mg/dL   Total Protein 7.3 6.5 - 8.1 g/dL   Albumin 2.5 (L) 3.5 - 5.0 g/dL   AST 48 (H) 15 - 41 U/L   ALT 53 (H) 0 - 44 U/L   Alkaline Phosphatase 193 (H) 38 - 126 U/L   Total Bilirubin 0.8 0.3 - 1.2 mg/dL   GFR, Estimated >50 >53 mL/min    Comment: (NOTE) Calculated using the CKD-EPI Creatinine Equation (2021)    Anion gap 12 5 - 15    Comment: Performed at [facility], [address], [place], [place] [address]  Prepare RBC (crossmatch)     Status: None   Collection Time: 10/14/22 11:04 PM  Result Value Ref Range   Order Confirmation      ORDER PROCESSED BY BLOOD BANK Performed at [facility], [address], [place], [place] [address]   Type and screen [facility]     Status: None (Preliminary result)   Collection Time: 10/14/22 11:05 PM  Result Value Ref Range   ABO/RH(D) O NEG    Antibody Screen NEG    Sample Expiration 10/17/2022,2359    Unit Number [id number]    Blood Component Type RED CELLS,LR    Unit division 00    Status of Unit ISSUED    Donor AG Type NEGATIVE FOR E ANTIGEN NEGATIVE FOR KELL ANTIGEN    Transfusion Status OK  TO TRANSFUSE    Crossmatch Result  Compatible Performed at [facility], [place] [address], [place], [place] [address]   HIV Antibody (routine testing w rflx)     Status: None   Collection Time: 10/15/22  1:03 AM  Result Value Ref Range   HIV Screen 4th Generation wRfx Non Reactive Non Reactive    Comment: Performed at [facility], [place] [address], [place] [address]  CBC     Status: Abnormal   Collection Time: 10/15/22  1:03 AM  Result Value Ref Range   WBC 12.8 (H) 4.0 - 10.5 K/uL   RBC 2.27 (L) 4.22 - 5.81 MIL/uL   Hemoglobin 5.5 (LL) 13.0 - 17.0 g/dL    Comment: CRITICAL VALUE NOTED.  VALUE IS CONSISTENT WITH PREVIOUSLY REPORTED AND CALLED VALUE. REPEATED TO VERIFY Reticulocyte Hemoglobin testing may be clinically indicated, consider ordering this additional test [id number]    HCT 16.6 (L) 39.0 - 52.0 %   MCV 73.1 (L) 80.0 - 100.0 fL   MCH 24.2 (L) 26.0 - 34.0 pg   MCHC 33.1 30.0 - 36.0 g/dL   RDW 24.1 (H) 11.5 - 15.5 %   Platelets 602 (H) 150 - 400 K/uL   nRBC 1.6 (H) 0.0 - 0.2 %    Comment: Performed at [facility], [place] [address], [place], [place] [address]  Creatinine, serum     Status: Abnormal   Collection Time: 10/15/22  1:03 AM  Result Value Ref Range   Creatinine, Ser 1.42 (H) 0.61 - 1.24 mg/dL   GFR, Estimated >60 >60 mL/min    Comment: (NOTE) Calculated using the CKD-EPI Creatinine Equation (2021) Performed at [facility], [place] [address], [place], [place] [address]   CBG monitoring, ED     Status: Abnormal   Collection Time: 10/15/22  7:28 AM  Result Value Ref Range   Glucose-Capillary 145 (H) 70 - 99 mg/dL    Comment: Glucose reference range applies only to samples taken after fasting for at least 8 hours.  Comprehensive metabolic panel     Status: Abnormal   Collection Time: 10/15/22  8:40 AM  Result Value Ref Range   Sodium 138 135 - 145 mmol/L   Potassium 3.2 (L) 3.5 - 5.1  mmol/L   Chloride 103 98 - 111 mmol/L   CO2 27 22 - 32 mmol/L   Glucose, Bld 168 (H) 70 - 99 mg/dL    Comment: Glucose reference range applies only to samples taken after fasting for at least 8 hours.   BUN 20 6 - 20 mg/dL   Creatinine, Ser 1.63 (H) 0.61 - 1.24 mg/dL   Calcium 8.0 (L) 8.9 - 10.3 mg/dL   Total Protein 6.6 6.5 - 8.1 g/dL   Albumin 2.3 (L) 3.5 - 5.0 g/dL   AST 32 15 - 41 U/L   ALT 44 0 - 44 U/L   Alkaline Phosphatase 158 (H) 38 - 126 U/L   Total Bilirubin 0.7 0.3 - 1.2 mg/dL   GFR, Estimated 51 (L) >60 mL/min    Comment: (NOTE) Calculated using the CKD-EPI Creatinine Equation (2021)    Anion gap 8 5 - 15    Comment: Performed at [facility], [place] [address] [place], [place] [address]  CBC with Differential/Platelet     Status: Abnormal   Collection Time: 10/15/22  8:40 AM  Result Value Ref Range   WBC 10.6 (H) 4.0 - 10.5 K/uL   RBC 2.51 (L) 4.22 - 5.81 MIL/uL  Hemoglobin 6.2 (LL) 13.0 - 17.0 g/dL    Comment: Reticulocyte Hemoglobin testing may be clinically indicated, consider ordering this additional test [id number] CRITICAL VALUE NOTED.  VALUE IS CONSISTENT WITH PREVIOUSLY REPORTED AND CALLED VALUE. REPEATED TO VERIFY    HCT 18.8 (L) 39.0 - 52.0 %   MCV 74.9 (L) 80.0 - 100.0 fL   MCH 24.7 (L) 26.0 - 34.0 pg   MCHC 33.0 30.0 - 36.0 g/dL   RDW 84.6 (H) 65.9 - 93.5 %   Platelets 541 (H) 150 - 400 K/uL   nRBC 2.1 (H) 0.0 - 0.2 %   Neutrophils Relative % 69 %   Neutro Abs 7.3 1.7 - 7.7 K/uL   Lymphocytes Relative 17 %   Lymphs Abs 1.8 0.7 - 4.0 K/uL   Monocytes Relative 10 %   Monocytes Absolute 1.1 (H) 0.1 - 1.0 K/uL   Eosinophils Relative 3 %   Eosinophils Absolute 0.4 0.0 - 0.5 K/uL   Basophils Relative 0 %   Basophils Absolute 0.0 0.0 - 0.1 K/uL   Immature Granulocytes 1 %   Abs Immature Granulocytes 0.05 0.00 - 0.07 K/uL   Polychromasia PRESENT    Sickle Cells PRESENT    Target Cells PRESENT     Comment: Performed at [facility], [address], [place], [place] [address]  CBG monitoring, ED     Status: Abnormal   Collection Time: 10/15/22 11:15 AM  Result Value Ref Range   Glucose-Capillary 142 (H) 70 - 99 mg/dL    Comment: Glucose reference range applies only to samples taken after fasting for at least 8 hours.  Hemoglobin and hematocrit, blood     Status: Abnormal   Collection Time: 10/15/22  1:15 PM  Result Value Ref Range   Hemoglobin 6.7 (LL) 13.0 - 17.0 g/dL    Comment: CRITICAL VALUE NOTED.  VALUE IS CONSISTENT WITH PREVIOUSLY REPORTED AND CALLED VALUE. REPEATED TO VERIFY    HCT 20.2 (L) 39.0 - 52.0 %    Comment: Performed at [facility], [address] [place], [place] [address]  Lactate dehydrogenase     Status: Abnormal   Collection Time: 10/15/22  1:15 PM  Result Value Ref Range   LDH 243 (H) 98 - 192 U/L    Comment: Performed at [facility], [address] [place], [place] [address]  Reticulocytes     Status: Abnormal   Collection Time: 10/15/22  1:15 PM  Result Value Ref Range   Retic Ct Pct 2.0 0.4 - 3.1 %   RBC. 2.69 (L) 4.22 - 5.81 MIL/uL   Retic Count, Absolute 54.1 19.0 - 186.0 K/uL   Immature Retic Fract 49.2 (H) 2.3 - 15.9 %    Comment: Performed at [facility], [address], [place], [place] [address]   DG Chest Port 1 View  Result Date: 10/14/2022 CLINICAL DATA:  Sickle cell pain crisis EXAM: PORTABLE CHEST 1 VIEW COMPARISON:  09/27/2022 FINDINGS: Thoracic spondylosis. Abnormal sclerosis in both proximal humeral heads and metaphyses compatible with chronic bony findings from sickle cell disease. The lungs appear clear. No blunting of the costophrenic angles. Heart size within normal limits. IMPRESSION: 1. No active cardiopulmonary disease is radiographically apparent. 2. Bony findings of sickle cell disease. 3. Thoracic spondylosis. Electronically Signed   By: Gaylyn Rong M.D.   On: 10/14/2022  17:14    Pending Labs [employer] (From admission, onward)     Start  Ordered   10/21/22 0500  Creatinine, serum  (enoxaparin (LOVENOX)    CrCl >/= 30 ml/min)  Weekly,   R     Comments: while on enoxaparin therapy    10/15/22 0050   10/15/22 0050  Hemoglobin A1c  Once,   R       Comments: To assess prior glycemic control    10/15/22 0050            Vitals/Pain Today's Vitals   10/15/22 1100 10/15/22 1130 10/15/22 1200 10/15/22 1343  BP: (!) 143/96 (!) 144/91 128/83   Pulse: 91 89 92   Resp: 17  (!) 23   Temp:    97.8 F (36.6 C)  TempSrc:    Oral  SpO2: 99% 97% 98%   PainSc:        Isolation Precautions No active isolations  Medications Medications  amLODipine (NORVASC) tablet 10 mg (10 mg Oral Given 10/15/22 0939)  pravastatin (PRAVACHOL) tablet 20 mg (has no administration in time range)  senna-docusate (Senokot-S) tablet 1 tablet (1 tablet Oral Given 10/15/22 0939)  polyethylene glycol (MIRALAX / GLYCOLAX) packet 17 g (has no administration in time range)  naloxone (NARCAN) injection 0.4 mg (has no administration in time range)    And  sodium chloride flush (NS) 0.9 % injection 9 mL (has no administration in time range)  diphenhydrAMINE (BENADRYL) capsule 25 mg (has no administration in time range)  enoxaparin (LOVENOX) injection 40 mg (40 mg Subcutaneous Given 10/15/22 0939)  0.45 % sodium chloride infusion ( Intravenous Rate/Dose Change 10/15/22 1252)  ketorolac (TORADOL) 15 MG/ML injection 15 mg (15 mg Intravenous Given 10/15/22 1107)  ondansetron (ZOFRAN) tablet 4 mg (has no administration in time range)    Or  ondansetron (ZOFRAN) injection 4 mg (has no administration in time range)  insulin aspart (novoLOG) injection 0-15 Units ( Subcutaneous Patient Refused/Not Given 10/15/22 1105)  insulin aspart (novoLOG) injection 0-5 Units ( Subcutaneous Patient Refused/Not Given 10/15/22 0125)  HYDROmorphone (DILAUDID) 1 mg/mL PCA injection (0 mg Intravenous Hold  10/15/22 1105)  HYDROmorphone (DILAUDID) injection 1 mg (1 mg Intravenous Given 10/14/22 2303)  ondansetron (ZOFRAN) injection 4 mg (4 mg Intravenous Given 10/14/22 2303)  0.9 %  sodium chloride infusion (Manually program via Guardrails IV Fluids) (0 mLs Intravenous Stopped 10/15/22 0535)  HYDROmorphone (DILAUDID) injection 0.5-1 mg (1 mg Intravenous Given 10/15/22 0850)    Mobility walks     Focused Assessments Cardiac Assessment Handoff:    Lab Results  Component Value Date   TROPONINI 0.05 ([place]) 09/22/2016   Lab Results  Component Value Date   DDIMER 1.14 (H) 09/22/2016   Does the Patient currently have chest pain? No    R Recommendations: See Admitting Provider Note  Report given to:   Additional Notes:

## 2022-10-15 NOTE — Progress Notes (Signed)
Subjective: Nicolas [name] is a 47 year old male with a medical history significant for sickle cell disease, type 2 diabetes mellitus, hypertension, obesity, chronic pain syndrome, opiate dependence and tolerance was admitted for sickle cell pain crisis. On admission, patient's hemoglobin was 5.5 g/dL which is very much decreased from patient's baseline.  Patient has received 1 unit of PRBCs with a slight increase in hemoglobin.  He continues to have some fatigue.  Patient is also complaining of allover body pain that has been present since influenza diagnosis several weeks ago. Pain intensity is 7/10.  He denies headache, chest pain, shortness of breath, nausea, vomiting, or diarrhea.   Objective:  Vital signs in last 24 hours:  Vitals:   10/15/22 1130 10/15/22 1200 10/15/22 1343 10/15/22 1439  BP: (!) 144/91 128/83  132/84  Pulse: 89 92  90  Resp:  (!) 23  (!) 24  Temp:   97.8 F (36.6 C) 98 F (36.7 C)  TempSrc:   Oral Oral  SpO2: 97% 98%  96%    Intake/Output from previous day:   Intake/Output Summary (Last 24 hours) at 10/15/2022 1601 Last data filed at 10/15/2022 0535 Gross per 24 hour  Intake 730.03 ml  Output --  Net 730.03 ml    Physical Exam: General: Alert, awake, oriented x3, in no acute distress.  HEENT: [place]/AT PEERL, EOMI Neck: Trachea midline,  no masses, no thyromegal,y no JVD, no carotid bruit OROPHARYNX:  Moist, No exudate/ erythema/lesions.  Heart: Regular rate and rhythm, without murmurs, rubs, gallops, PMI non-displaced, no heaves or thrills on palpation.  Lungs: Clear to auscultation, no wheezing or rhonchi noted. No increased vocal fremitus resonant to percussion  Abdomen: Soft, nontender, nondistended, positive bowel sounds, no masses no hepatosplenomegaly noted..  Neuro: No focal neurological deficits noted cranial nerves II through XII grossly intact. DTRs 2+ bilaterally upper and lower extremities. Strength 5 out of 5 in bilateral upper and lower  extremities. Musculoskeletal: No warm swelling or erythema around joints, no spinal tenderness noted. Psychiatric: Patient alert and oriented x3, good insight and cognition, good recent to remote recall. Lymph node survey: No cervical axillary or inguinal lymphadenopathy noted.  Lab Results:  Basic Metabolic Panel:    Component Value Date/Time   NA 138 10/15/2022 0840   NA 142 07/15/2022 1626   K 3.2 (L) 10/15/2022 0840   CL 103 10/15/2022 0840   CO2 27 10/15/2022 0840   BUN 20 10/15/2022 0840   BUN 12 07/15/2022 1626   CREATININE 1.63 (H) 10/15/2022 0840   GLUCOSE 168 (H) 10/15/2022 0840   CALCIUM 8.0 (L) 10/15/2022 0840   CBC:    Component Value Date/Time   WBC 10.6 (H) 10/15/2022 0840   HGB 6.7 (LL) 10/15/2022 1315   HGB 12.2 (L) 06/04/2022 1458   HCT 20.2 (L) 10/15/2022 1315   HCT 36.7 (L) 06/04/2022 1458   PLT 541 (H) 10/15/2022 0840   PLT 448 06/04/2022 1458   MCV 74.9 (L) 10/15/2022 0840   MCV 89 06/04/2022 1458   NEUTROABS 7.3 10/15/2022 0840   NEUTROABS 5.7 06/04/2022 1458   LYMPHSABS 1.8 10/15/2022 0840   LYMPHSABS 2.6 06/04/2022 1458   MONOABS 1.1 (H) 10/15/2022 0840   EOSABS 0.4 10/15/2022 0840   EOSABS 0.2 06/04/2022 1458   BASOSABS 0.0 10/15/2022 0840   BASOSABS 0.1 06/04/2022 1458    No results found for this or any previous visit (from the past 240 hour(s)).  Studies/Results: DG Chest Port 1 View  Result Date: 10/14/2022 CLINICAL  DATA:  Sickle cell pain crisis EXAM: PORTABLE CHEST 1 VIEW COMPARISON:  09/27/2022 FINDINGS: Thoracic spondylosis. Abnormal sclerosis in both proximal humeral heads and metaphyses compatible with chronic bony findings from sickle cell disease. The lungs appear clear. No blunting of the costophrenic angles. Heart size within normal limits. IMPRESSION: 1. No active cardiopulmonary disease is radiographically apparent. 2. Bony findings of sickle cell disease. 3. Thoracic spondylosis. Electronically Signed   By: Van Clines  M.D.   On: 10/14/2022 17:14    Medications: Scheduled Meds:  acetaminophen  650 mg Oral Once   amLODipine  10 mg Oral Daily   diphenhydrAMINE  25 mg Intravenous Once   enoxaparin (LOVENOX) injection  40 mg Subcutaneous Q24H   HYDROmorphone   Intravenous Q4H   insulin aspart  0-15 Units Subcutaneous TID WC   insulin aspart  0-5 Units Subcutaneous QHS   ketorolac  15 mg Intravenous Q6H   pravastatin  20 mg Oral q1800   senna-docusate  1 tablet Oral BID   Continuous Infusions:  sodium chloride 50 mL/hr at 10/15/22 1252   PRN Meds:.diphenhydrAMINE, naloxone **AND** sodium chloride flush, ondansetron **OR** ondansetron (ZOFRAN) IV, polyethylene glycol  Consultants: none  Procedures: none  Antibiotics: none  Assessment/Plan: Principal Problem:   Sickle cell anemia with crisis ([place]) Active Problems:   Type 2 diabetes mellitus without complication, without long-term current use of insulin (HCC)   Hypokalemia   Hypertensive heart disease   Anemia   Sickle cell crisis (HCC)   Obesity   Sickle cell disease, type [place] (HCC)   Avascular necrosis (HCC)  Sickle cell disease with pain crisis:  Dilaudid 1 mg every 2 hours as needed while in the ER Initiate IV dilaudid PCA Hold Toradol due to acute kidney injury Decrease IV fluids to 0.45% saline to 50 ml/hr Monitor vital signs very closely, reevaluate pain scale regularly, and supplemental oxygen as needed.   Anemia of chronic disease:  Hemoglobin 6.7. Patient is s/p 1 unit PRBCs. Transfuse an additional unit of PRBCs. Follow labs in am. If hemoglobin remains below baseline, consider transfusing additional unit. Defer to sickle cell team for non emergent blood transfusions.   Chronic pain syndrome:  Will restart home pain medications as pain intensity improves.   Type II DM:  Continue SSI and carbohydrate modified diet.   Essential hypertension:  Stable. Continue home medications  Hypokalemia:  Replete potassium. Follow  labs in am.   Elevated LFTs:  Resolved. Continue to monitor closely.   Acute kidney injury:  Stable. Continue gently hydration. Avoid all nephrotoxins. Follow labs in am.   Obesity:  The patient is asked to make an attempt to improve diet patterns to aid in medical management of this problem.   Code Status: Full Code Family Communication: N/A Disposition Plan: Not yet ready for discharge  [place], MSN, FNP-C [facility] [address] [place], [place] [address] [phone]  If 7PM-7AM, please contact night-coverage.  10/15/2022, 4:01 PM  LOS: 1 day

## 2022-10-16 DIAGNOSIS — D5700 Hb-SS disease with crisis, unspecified: Secondary | ICD-10-CM | POA: Diagnosis not present

## 2022-10-16 LAB — CBC
HCT: 21.7 % — ABNORMAL LOW (ref 39.0–52.0)
Hemoglobin: 7.2 g/dL — ABNORMAL LOW (ref 13.0–17.0)
MCH: 25 pg — ABNORMAL LOW (ref 26.0–34.0)
MCHC: 33.2 g/dL (ref 30.0–36.0)
MCV: 75.3 fL — ABNORMAL LOW (ref 80.0–100.0)
Platelets: 483 10*3/uL — ABNORMAL HIGH (ref 150–400)
RBC: 2.88 MIL/uL — ABNORMAL LOW (ref 4.22–5.81)
RDW: 23.5 % — ABNORMAL HIGH (ref 11.5–15.5)
WBC: 9.6 10*3/uL (ref 4.0–10.5)
nRBC: 4.1 % — ABNORMAL HIGH (ref 0.0–0.2)

## 2022-10-16 LAB — GLUCOSE, CAPILLARY
Glucose-Capillary: 109 mg/dL — ABNORMAL HIGH (ref 70–99)
Glucose-Capillary: 114 mg/dL — ABNORMAL HIGH (ref 70–99)
Glucose-Capillary: 109 mg/dL — ABNORMAL HIGH (ref 70–99)

## 2022-10-16 LAB — TYPE AND SCREEN
ABO/RH(D): O NEG
Antibody Screen: NEGATIVE
Donor AG Type: NEGATIVE
Donor AG Type: NEGATIVE
Unit division: 0
Unit division: 0

## 2022-10-16 LAB — BPAM RBC
Blood Product Expiration Date: 202401302359
Blood Product Expiration Date: 202401302359
ISSUE DATE / TIME: 202401170158
ISSUE DATE / TIME: 202401171943
Unit Type and Rh: 9500
Unit Type and Rh: 9500

## 2022-10-16 LAB — RETICULOCYTES
Immature Retic Fract: 38.1 % — ABNORMAL HIGH (ref 2.3–15.9)
RBC.: 2.82 MIL/uL — ABNORMAL LOW (ref 4.22–5.81)
Retic Count, Absolute: 78.4 10*3/uL (ref 19.0–186.0)
Retic Ct Pct: 2.8 % (ref 0.4–3.1)

## 2022-10-16 LAB — LACTATE DEHYDROGENASE: LDH: 287 U/L — ABNORMAL HIGH (ref 98–192)

## 2022-10-16 MED ORDER — OXYCODONE HCL 5 MG PO TABS: 10.0000 mg | ORAL_TABLET | ORAL | Status: DC | PRN

## 2022-10-16 MED ORDER — HYDROMORPHONE 1 MG/ML IV SOLN
INTRAVENOUS | Status: DC
  Administered 2022-10-16: 2 mg via INTRAVENOUS
  Administered 2022-10-17: 1 mg via INTRAVENOUS
  Administered 2022-10-17: 0.5 mg via INTRAVENOUS
  Administered 2022-10-17: 1.5 mg via INTRAVENOUS
  Administered 2022-10-17: 4.5 mg via INTRAVENOUS
  Administered 2022-10-17: 0.5 mg via INTRAVENOUS
  Administered 2022-10-17: 2.2 mg via INTRAVENOUS
  Administered 2022-10-17 – 2022-10-18 (×2): 0.5 mg via INTRAVENOUS
  Administered 2022-10-18: 2.5 mg via INTRAVENOUS
  Administered 2022-10-18: 3.5 mg via INTRAVENOUS
  Administered 2022-10-18: 30 mg via INTRAVENOUS
  Filled 2022-10-16: qty 30

## 2022-10-16 NOTE — Progress Notes (Signed)
Subjective: Nicolas [name] is a 47 year old male with a medical history significant for sickle cell disease, type 2 diabetes mellitus, hypertension, obesity, chronic pain syndrome, opiate dependence and tolerance was admitted for sickle cell pain crisis. Patient continues to complain of allover body pain.  He rates his pain as 7/10.  He says that he cannot manage at home at this time.  He denies headache, chest pain, shortness of breath, nausea, vomiting, or diarrhea.   Objective:  Vital signs in last 24 hours:  Vitals:   10/16/22 0800 10/16/22 1020 10/16/22 1225 10/16/22 1343  BP:  (!) 149/80  (!) 152/93  Pulse:  97  (!) 103  Resp: 19 16 18 16   Temp:  99 F (37.2 C)  99.9 F (37.7 C)  TempSrc:  Oral  Oral  SpO2: 95% 96% 97% 98%    Intake/Output from previous day:   Intake/Output Summary (Last 24 hours) at 10/16/2022 1518 Last data filed at 10/16/2022 1310 Gross per 24 hour  Intake 1642.27 ml  Output --  Net 1642.27 ml    Physical Exam: General: Alert, awake, oriented x3, in no acute distress.  HEENT: [place]/AT PEERL, EOMI Neck: Trachea midline,  no masses, no thyromegal,y no JVD, no carotid bruit OROPHARYNX:  Moist, No exudate/ erythema/lesions.  Heart: Regular rate and rhythm, without murmurs, rubs, gallops, PMI non-displaced, no heaves or thrills on palpation.  Lungs: Clear to auscultation, no wheezing or rhonchi noted. No increased vocal fremitus resonant to percussion  Abdomen: Soft, nontender, nondistended, positive bowel sounds, no masses no hepatosplenomegaly noted..  Neuro: No focal neurological deficits noted cranial nerves II through XII grossly intact. DTRs 2+ bilaterally upper and lower extremities. Strength 5 out of 5 in bilateral upper and lower extremities. Musculoskeletal: No warm swelling or erythema around joints, no spinal tenderness noted. Psychiatric: Patient alert and oriented x3, good insight and cognition, good recent to remote recall. Lymph node survey: No  cervical axillary or inguinal lymphadenopathy noted.  Lab Results:  Basic Metabolic Panel:    Component Value Date/Time   NA 138 10/15/2022 0840   NA 142 07/15/2022 1626   K 3.2 (L) 10/15/2022 0840   CL 103 10/15/2022 0840   CO2 27 10/15/2022 0840   BUN 20 10/15/2022 0840   BUN 12 07/15/2022 1626   CREATININE 1.63 (H) 10/15/2022 0840   GLUCOSE 168 (H) 10/15/2022 0840   CALCIUM 8.0 (L) 10/15/2022 0840   CBC:    Component Value Date/Time   WBC 9.6 10/16/2022 0536   HGB 7.2 (L) 10/16/2022 0536   HGB 12.2 (L) 06/04/2022 1458   HCT 21.7 (L) 10/16/2022 0536   HCT 36.7 (L) 06/04/2022 1458   PLT 483 (H) 10/16/2022 0536   PLT 448 06/04/2022 1458   MCV 75.3 (L) 10/16/2022 0536   MCV 89 06/04/2022 1458   NEUTROABS 7.3 10/15/2022 0840   NEUTROABS 5.7 06/04/2022 1458   LYMPHSABS 1.8 10/15/2022 0840   LYMPHSABS 2.6 06/04/2022 1458   MONOABS 1.1 (H) 10/15/2022 0840   EOSABS 0.4 10/15/2022 0840   EOSABS 0.2 06/04/2022 1458   BASOSABS 0.0 10/15/2022 0840   BASOSABS 0.1 06/04/2022 1458    No results found for this or any previous visit (from the past 240 hour(s)).  Studies/Results: DG Chest Port 1 View  Result Date: 10/14/2022 CLINICAL DATA:  Sickle cell pain crisis EXAM: PORTABLE CHEST 1 VIEW COMPARISON:  09/27/2022 FINDINGS: Thoracic spondylosis. Abnormal sclerosis in both proximal humeral heads and metaphyses compatible with chronic bony findings from sickle  cell disease. The lungs appear clear. No blunting of the costophrenic angles. Heart size within normal limits. IMPRESSION: 1. No active cardiopulmonary disease is radiographically apparent. 2. Bony findings of sickle cell disease. 3. Thoracic spondylosis. Electronically Signed   By: Van Clines M.D.   On: 10/14/2022 17:14    Medications: Scheduled Meds:  amLODipine  10 mg Oral Daily   enoxaparin (LOVENOX) injection  40 mg Subcutaneous Q24H   HYDROmorphone   Intravenous Q4H   insulin aspart  0-15 Units Subcutaneous TID  WC   insulin aspart  0-5 Units Subcutaneous QHS   pravastatin  20 mg Oral q1800   senna-docusate  1 tablet Oral BID   Continuous Infusions:  sodium chloride 10 mL/hr at 10/16/22 1345   PRN Meds:.diphenhydrAMINE, naloxone **AND** sodium chloride flush, ondansetron **OR** ondansetron (ZOFRAN) IV, oxyCODONE, polyethylene glycol  Consultants: none  Procedures: none  Antibiotics: none  Assessment/Plan: Principal Problem:   Sickle cell anemia with crisis ([place]) Active Problems:   Type 2 diabetes mellitus without complication, without long-term current use of insulin (HCC)   Hypokalemia   Hypertensive heart disease   Anemia   Sickle cell crisis (HCC)   Obesity   Sickle cell disease, type [place] (HCC)   Avascular necrosis (HCC)  Sickle cell disease with pain crisis:   Weaning IV Dilaudid PCA, will restart oxycodone 10 mg every 4 hours as needed for severe breakthrough pain. Hold Toradol due to acute kidney injury Decrease IV fluids to [facility] Monitor vital signs very closely, reevaluate pain scale regularly, and supplemental oxygen as needed.   Anemia of chronic disease:  Patient is status post 2 units PRBCs.  Hemoglobin is improved to 7.2 g/dL.  Follow labs in am. If hemoglobin remains below baseline, consider transfusing additional unit. Defer to sickle cell team for non emergent blood transfusions.   Chronic pain syndrome:  Will restart home pain medications as pain intensity improves.   Type II DM:  Continue SSI and carbohydrate modified diet.   Essential hypertension:  Stable. Continue home medications  Hypokalemia:  Replete potassium. Follow labs in am.   Elevated LFTs:  Resolved. Continue to monitor closely.   Acute kidney injury:  Stable. Continue gently hydration. Avoid all nephrotoxins. Follow labs in am.   Obesity:  The patient is asked to make an attempt to improve diet patterns to aid in medical management of this problem.   Code Status: Full Code Family  Communication: N/A Disposition Plan: Not yet ready for discharge  [place], MSN, FNP-C Patient [facility] [address] [place], [place] [address] [phone]  If 7PM-7AM, please contact night-coverage.  10/16/2022, 3:18 PM  LOS: 2 days

## 2022-10-16 NOTE — Plan of Care (Signed)
  Problem: Tissue Perfusion: Goal: Adequacy of tissue perfusion will improve Outcome: Not Progressing   Problem: Pain Managment: Goal: General experience of comfort will improve Outcome: Not Progressing

## 2022-10-17 LAB — BASIC METABOLIC PANEL
Anion gap: 11 (ref 5–15)
Anion gap: 13 (ref 5–15)
BUN: 16 mg/dL (ref 6–20)
BUN: 15 mg/dL (ref 6–20)
CO2: 28 mmol/L (ref 22–32)
CO2: 26 mmol/L (ref 22–32)
Calcium: 8.5 mg/dL — ABNORMAL LOW (ref 8.9–10.3)
Calcium: 8.6 mg/dL — ABNORMAL LOW (ref 8.9–10.3)
Chloride: 100 mmol/L (ref 98–111)
Chloride: 98 mmol/L (ref 98–111)
Creatinine, Ser: 1.31 mg/dL — ABNORMAL HIGH (ref 0.61–1.24)
Creatinine, Ser: 1.18 mg/dL (ref 0.61–1.24)
GFR, Estimated: >60 mL/min (ref >60)
GFR, Estimated: >60 mL/min (ref >60)
Glucose, Bld: 139 mg/dL — ABNORMAL HIGH (ref 70–99)
Glucose, Bld: 166 mg/dL — ABNORMAL HIGH (ref 70–99)
Potassium: 2.8 mmol/L — ABNORMAL LOW (ref 3.5–5.1)
Potassium: 3.4 mmol/L — ABNORMAL LOW (ref 3.5–5.1)
Sodium: 139 mmol/L (ref 135–145)
Sodium: 137 mmol/L (ref 135–145)

## 2022-10-17 LAB — HEMOGLOBIN A1C
Hgb A1c MFr Bld: 4.4 % — ABNORMAL LOW (ref 4.8–5.6)
Mean Plasma Glucose: 80 mg/dL

## 2022-10-17 LAB — CBC
HCT: 24.1 % — ABNORMAL LOW (ref 39.0–52.0)
Hemoglobin: 7.9 g/dL — ABNORMAL LOW (ref 13.0–17.0)
MCH: 25.2 pg — ABNORMAL LOW (ref 26.0–34.0)
MCHC: 32.8 g/dL (ref 30.0–36.0)
MCV: 76.8 fL — ABNORMAL LOW (ref 80.0–100.0)
Platelets: 613 10*3/uL — ABNORMAL HIGH (ref 150–400)
RBC: 3.14 MIL/uL — ABNORMAL LOW (ref 4.22–5.81)
RDW: 23.9 % — ABNORMAL HIGH (ref 11.5–15.5)
WBC: 13.9 10*3/uL — ABNORMAL HIGH (ref 4.0–10.5)
nRBC: 2.9 % — ABNORMAL HIGH (ref 0.0–0.2)

## 2022-10-17 LAB — GLUCOSE, CAPILLARY
Glucose-Capillary: 139 mg/dL — ABNORMAL HIGH (ref 70–99)
Glucose-Capillary: 153 mg/dL — ABNORMAL HIGH (ref 70–99)
Glucose-Capillary: 101 mg/dL — ABNORMAL HIGH (ref 70–99)
Glucose-Capillary: 131 mg/dL — ABNORMAL HIGH (ref 70–99)
Glucose-Capillary: 148 mg/dL — ABNORMAL HIGH (ref 70–99)

## 2022-10-17 LAB — MAGNESIUM: Magnesium: 1.5 mg/dL — ABNORMAL LOW (ref 1.7–2.4)

## 2022-10-17 MED ORDER — ACETAMINOPHEN 325 MG PO TABS
650.0000 mg | ORAL_TABLET | Freq: Four times a day (QID) | ORAL | Status: DC | PRN
  Administered 2022-10-17 (×2): 650 mg via ORAL
  Filled 2022-10-17 (×2): qty 2

## 2022-10-17 MED ORDER — MAGNESIUM SULFATE 2 GM/50ML IV SOLN
2.0000 g | Freq: Once | INTRAVENOUS | Status: AC
  Administered 2022-10-17: 2 g via INTRAVENOUS
  Filled 2022-10-17: qty 50

## 2022-10-17 MED ORDER — POTASSIUM CHLORIDE CRYS ER 10 MEQ PO TBCR
10.0000 meq | EXTENDED_RELEASE_TABLET | Freq: Once | ORAL | Status: AC
  Administered 2022-10-17: 10 meq via ORAL
  Filled 2022-10-17: qty 1

## 2022-10-17 NOTE — Progress Notes (Signed)
Subjective: Nicolas [name] is a 47 year old male with a medical history significant for sickle cell disease, type 2 diabetes mellitus, hypertension, obesity, chronic pain syndrome, opiate dependence and tolerance was admitted for sickle cell pain crisis.  Patient continues to complain of allover body pain.  He rates his pain as 7/10.  He says that he cannot manage at home at this time.  He denies headache, chest pain, shortness of breath, nausea, vomiting, or diarrhea.   Objective:  Vital signs in last 24 hours:  Vitals:   10/17/22 0505 10/17/22 0738 10/17/22 1041 10/17/22 1218  BP:   (!) 142/95   Pulse:   (!) 109   Resp: 18 18 20 16   Temp:   99.3 F (37.4 C)   TempSrc:   Oral   SpO2: 94% 95% 99% 98%    Intake/Output from previous day:   Intake/Output Summary (Last 24 hours) at 10/17/2022 1239 Last data filed at 10/17/2022 0837 Gross per 24 hour  Intake 680 ml  Output --  Net 680 ml    Physical Exam: General: Alert, awake, oriented x3, in no acute distress.  HEENT: /AT PEERL, EOMI Neck: Trachea midline,  no masses, no thyromegal,y no JVD, no carotid bruit OROPHARYNX:  Moist, No exudate/ erythema/lesions.  Heart: Regular rate and rhythm, without murmurs, rubs, gallops, PMI non-displaced, no heaves or thrills on palpation.  Lungs: Clear to auscultation, no wheezing or rhonchi noted. No increased vocal fremitus resonant to percussion  Abdomen: Soft, nontender, nondistended, positive bowel sounds, no masses no hepatosplenomegaly noted..  Neuro: No focal neurological deficits noted cranial nerves II through XII grossly intact. DTRs 2+ bilaterally upper and lower extremities. Strength 5 out of 5 in bilateral upper and lower extremities. Musculoskeletal: No warm swelling or erythema around joints, no spinal tenderness noted. Psychiatric: Patient alert and oriented x3, good insight and cognition, good recent to remote recall. Lymph node survey: No cervical axillary or inguinal  lymphadenopathy noted.  Lab Results:  Basic Metabolic Panel:    Component Value Date/Time   NA 139 10/17/2022 0604   NA 142 07/15/2022 1626   K 2.8 (L) 10/17/2022 0604   CL 100 10/17/2022 0604   CO2 28 10/17/2022 0604   BUN 16 10/17/2022 0604   BUN 12 07/15/2022 1626   CREATININE 1.31 (H) 10/17/2022 0604   GLUCOSE 139 (H) 10/17/2022 0604   CALCIUM 8.5 (L) 10/17/2022 0604   CBC:    Component Value Date/Time   WBC 13.9 (H) 10/17/2022 0604   HGB 7.9 (L) 10/17/2022 0604   HGB 12.2 (L) 06/04/2022 1458   HCT 24.1 (L) 10/17/2022 0604   HCT 36.7 (L) 06/04/2022 1458   PLT 613 (H) 10/17/2022 0604   PLT 448 06/04/2022 1458   MCV 76.8 (L) 10/17/2022 0604   MCV 89 06/04/2022 1458   NEUTROABS 7.3 10/15/2022 0840   NEUTROABS 5.7 06/04/2022 1458   LYMPHSABS 1.8 10/15/2022 0840   LYMPHSABS 2.6 06/04/2022 1458   MONOABS 1.1 (H) 10/15/2022 0840   EOSABS 0.4 10/15/2022 0840   EOSABS 0.2 06/04/2022 1458   BASOSABS 0.0 10/15/2022 0840   BASOSABS 0.1 06/04/2022 1458    No results found for this or any previous visit (from the past 240 hour(s)).  Studies/Results: No results found.  Medications: Scheduled Meds:  amLODipine  10 mg Oral Daily   enoxaparin (LOVENOX) injection  40 mg Subcutaneous Q24H   HYDROmorphone   Intravenous Q4H   insulin aspart  0-15 Units Subcutaneous TID WC   insulin aspart  0-5 Units Subcutaneous QHS   pravastatin  20 mg Oral q1800   senna-docusate  1 tablet Oral BID   Continuous Infusions:  sodium chloride 10 mL/hr at 10/16/22 1345   PRN Meds:.acetaminophen, diphenhydrAMINE, naloxone **AND** sodium chloride flush, ondansetron **OR** ondansetron (ZOFRAN) IV, oxyCODONE, polyethylene glycol  Consultants: none  Procedures: none  Antibiotics: none  Assessment/Plan: Principal Problem:   Sickle cell anemia with crisis ([place]) Active Problems:   Type 2 diabetes mellitus without complication, without long-term current use of insulin (HCC)   Hypokalemia    Hypertensive heart disease   Anemia   Sickle cell crisis (HCC)   Obesity   Sickle cell disease, type [place] (HCC)   Avascular necrosis (HCC)  Sickle cell disease with pain crisis:   Weaning IV Dilaudid PCA, will restart oxycodone 10 mg every 4 hours as needed for severe breakthrough pain. Hold Toradol due to acute kidney injury Decrease IV fluids to [facility] Monitor vital signs very closely, reevaluate pain scale regularly, and supplemental oxygen as needed.   Anemia of chronic disease:  Patient is status post 2 units PRBCs.  Hemoglobin is improved to 7.2 g/dL.  Follow labs in am. If hemoglobin remains below baseline, consider transfusing additional unit. Defer to sickle cell team for non emergent blood transfusions.   Chronic pain syndrome:  Will restart home pain medications as pain intensity improves.   Type II DM:  Continue SSI and carbohydrate modified diet.   Essential hypertension:  Stable. Continue home medications  Hypokalemia:  Replete potassium. Follow labs in am.   Elevated LFTs:  Resolved. Continue to monitor closely.   Acute kidney injury:  Stable. Continue gently hydration. Avoid all nephrotoxins. Follow labs in am.   Obesity:  The patient is asked to make an attempt to improve diet patterns to aid in medical management of this problem.   Code Status: Full Code Family Communication: N/A Disposition Plan: Not yet ready for discharge.  Discharge planned for 10/18/2022  Donia Pounds  APRN, MSN, FNP-C Patient [facility] [place], [place] [address] [phone]  If 7PM-7AM, please contact night-coverage.  10/17/2022, 12:39 PM  LOS: 3 days

## 2022-10-17 NOTE — Care Management Important Message (Signed)
Important Message  Patient Details IM Letter given. Name: Nicolas [name] MRN: [id number] Date of Birth: September 28, 1976   Medicare Important Message Given:  Yes     Kerin Salen 10/17/2022, 8:37 AM

## 2022-10-18 DIAGNOSIS — D649 Anemia, unspecified: Secondary | ICD-10-CM

## 2022-10-18 DIAGNOSIS — D5700 Hb-SS disease with crisis, unspecified: Secondary | ICD-10-CM | POA: Diagnosis not present

## 2022-10-18 LAB — GLUCOSE, CAPILLARY
Glucose-Capillary: 158 mg/dL — ABNORMAL HIGH (ref 70–99)
Glucose-Capillary: 119 mg/dL — ABNORMAL HIGH (ref 70–99)

## 2022-10-18 NOTE — Discharge Summary (Signed)
Physician Discharge Summary  [name] [name]:[id number] DOB: 1975-11-25 DOA: 10/14/2022  PCP: [name], MD  Admit date: 10/14/2022  Discharge date: 10/18/2022  Discharge Diagnoses:  Principal Problem:   Sickle cell anemia with crisis [facility]) Active Problems:   Type 2 diabetes mellitus without complication, without long-term current use of insulin (HCC)   Hypokalemia   Hypertensive heart disease   Anemia   Sickle cell crisis ([place])   Obesity   Sickle cell disease, type [place] ([place])   Avascular necrosis ([place])   Discharge Condition: Stable  Disposition:   Follow-up Information     Nicolas Dew, FNP Follow up.   Specialty: Family Medicine Why: [facility] [phone] M-F 8-4:30 Contact information: [address] [address] [phone]                Pt is discharged home in good condition and is to follow up with [name], MD this week to have labs evaluated. Nicolas [name] is instructed to increase activity slowly and balance with rest for the next few days, and use prescribed medication to complete treatment of pain  Diet: Regular Wt Readings from Last 3 Encounters:  09/27/22 99.8 kg  09/25/22 98.9 kg  08/12/22 103.9 kg    History of present illness:  Nicolas [name] is a 47 y.o. male with medical history significant of sickle cell Bib[place] disease, diabetes, hypertension, hyperlipidemia, depression with anxiety who presented with pain in his legs and elbows as well as lower back. Pain is rated as 10 out of 10. Consistent with his typical sickle cell crisis. Patient has taken his home regimen with no relief. He also took treatment in the ER including Dilaudid. Despite several doses did not get adequate relief. His hemoglobin was noted to to be at 5.5. Most recently it was 9.9 in December. High suspicion for a plastic crisis from sickle cell. Did not report any viral illness. Patient has significant leukocytosis. Other prominent findings is  hypokalemia. At this point he will be admitted to the hospital with acute sickle cell crisis.   Hospital Course:  Patient was admitted for sickle cell pain crisis and symptomatic anemia and managed appropriately with IVF, IV Dilaudid via PCA and IV Toradol, as well as other adjunct therapies per sickle cell pain management protocols.  He was transfused with a total of 2 units of packed red blood cell with improvement in hemoglobin from 5.5 on admission to 7.9 at the time of discharge.  Patient's pain slowly returned to baseline and most of his symptoms improved significantly.  He was successfully weaned off IV Dilaudid via PCA to his home pain medications.  Toradol was discontinued due to acute kidney injury which resolved with hydration.  His chronic medical conditions which include type 2 diabetes mellitus and hypertension well-controlled appropriately with his home medications.  Patient was again counseled extensively about his weight and possibility of using lifestyle modification to reduce weight.  As at today, patient tolerating p.o. intake with no significant restrictions, ambulating well with no significant pain.  Patient requested to be discharged home because according to patient, he can manage his pain at home at this time.  His electrolytes are back to normal his elevated LFTs are resolved. Patient was therefore discharged home today in a hemodynamically stable condition.   Nicolas will follow-up with PCP within 1 week of this discharge. [name] was counseled extensively about nonpharmacologic means of pain management, patient verbalized understanding and was appreciative of  the  care received during this admission.   We discussed the need for good hydration, monitoring of hydration status, avoidance of heat, cold, stress, and infection triggers. We discussed the need to be adherent with taking Hydrea and other home medications. Patient was reminded of the need to seek medical attention immediately if  any symptom of bleeding, anemia, or infection occurs.  Discharge Exam: Vitals:   10/18/22 1018 10/18/22 1256  BP: (!) 143/88   Pulse: (!) 108   Resp: 18 (!) 22  Temp: 99.5 F (37.5 C)   SpO2: 98% 95%   Vitals:   10/18/22 0517 10/18/22 0813 10/18/22 1018 10/18/22 1256  BP: (!) 154/80  (!) 143/88   Pulse: 92  (!) 108   Resp: 18 19 18  (!) 22  Temp: 100.1 F (37.8 C)  99.5 F (37.5 C)   TempSrc: Oral  Oral   SpO2: 92% 94% 98% 95%   General appearance : Awake, alert, not in any distress. Speech Clear. Not toxic looking HEENT: Atraumatic and Normocephalic, pupils equally reactive to light and accomodation Neck: Supple, no JVD. No cervical lymphadenopathy.  Chest: Good air entry bilaterally, no added sounds  CVS: S1 S2 regular, no murmurs.  Abdomen: Bowel sounds present, Non tender and not distended with no gaurding, rigidity or rebound. Extremities: B/L Lower Ext shows no edema, both legs are warm to touch Neurology: Awake alert, and oriented X 3, CN II-XII intact, Non focal Skin: No Rash  Discharge Instructions  Discharge Instructions     Diet - low sodium heart healthy   Complete by: As directed    Increase activity slowly   Complete by: As directed       Allergies as of 10/18/2022   No Known Allergies      Medication List     STOP taking these medications    oseltamivir 75 MG capsule Commonly known as: TAMIFLU       TAKE these medications    accu-chek multiclix lancets Use as instructed What changed:  how much to take how to take this when to take this additional instructions   amLODipine 10 MG tablet Commonly known as: NORVASC TAKE 1 TABLET BY MOUTH EVERY DAY   carvedilol 6.25 MG tablet Commonly known as: COREG TAKE 1 TABLET BY MOUTH TWICE A DAY WITH MEALS   cyclobenzaprine 10 MG tablet Commonly known as: FLEXERIL Take 10 mg by mouth 3 (three) times daily as needed for muscle spasms.   diclofenac Sodium 1 % Gel Commonly known as:  Voltaren Apply 2 g topically 3 (three) times daily as needed.   folic acid 1 MG tablet Commonly known as: FOLVITE Take 1 tablet (1 mg total) by mouth daily.   glucose blood test strip Use as instructed What changed:  how much to take how to take this when to take this   ibuprofen 800 MG tablet Commonly known as: ADVIL Take 1 tablet (800 mg total) by mouth every 8 (eight) hours as needed.   metFORMIN 500 MG tablet Commonly known as: GLUCOPHAGE TAKE 1 TABLET BY MOUTH TWICE A DAY What changed: when to take this   ONE-A-DAY MENS PO Take 1 tablet by mouth daily with breakfast. 1 daily   Oxbryta 500 MG Tabs tablet Generic drug: voxelotor Take 1,500 mg by mouth daily.   oxyCODONE-acetaminophen 7.5-325 MG tablet Commonly known as: PERCOCET Take 1 tablet by mouth every 4 (four) hours as needed for severe pain.   Ozempic (0.25 or 0.5 MG/DOSE) 2 MG/1.5ML Sopn  Generic drug: Semaglutide(0.25 or 0.5MG /DOS) INJECT 0.5 MG INTO THE SKIN ONCE A WEEK. What changed: when to take this   pravastatin 20 MG tablet Commonly known as: PRAVACHOL TAKE 1 TABLET BY MOUTH EVERY DAY IN THE EVENING What changed:  how much to take how to take this   triamterene-hydrochlorothiazide 37.5-25 MG tablet Commonly known as: MAXZIDE-25 TAKE 1 TABLET BY MOUTH EVERY DAY   valsartan 160 MG tablet Commonly known as: Diovan Take 1 tablet (160 mg total) by mouth daily.   Vitamin D3 50 MCG (2000 UT) capsule Take 2,000 Units by mouth daily.        The results of significant diagnostics from this hospitalization (including imaging, microbiology, ancillary and laboratory) are listed below for reference.    Significant Diagnostic Studies: DG Chest Port 1 View  Result Date: 10/14/2022 CLINICAL DATA:  Sickle cell pain crisis EXAM: PORTABLE CHEST 1 VIEW COMPARISON:  09/27/2022 FINDINGS: Thoracic spondylosis. Abnormal sclerosis in both proximal humeral heads and metaphyses compatible with chronic bony  findings from sickle cell disease. The lungs appear clear. No blunting of the costophrenic angles. Heart size within normal limits. IMPRESSION: 1. No active cardiopulmonary disease is radiographically apparent. 2. Bony findings of sickle cell disease. 3. Thoracic spondylosis. Electronically Signed   By: Gaylyn Rong M.D.   On: 10/14/2022 17:14   DG Chest 1 View  Result Date: 09/27/2022 CLINICAL DATA:  Generalized body aches. History of sickle cell disease. The patient's son recently had flu the patient thinks he may have it also. EXAM: CHEST  1 VIEW COMPARISON:  09/28/2016.  Chest CTA dated 09/26/2016. FINDINGS: Poor inspiration. Grossly normal sized heart with an interval decrease in size. Clear lungs. Patchy sclerosis in both humeral heads and thoracic spine. Progressive thoracic spine degenerative changes. Moderate left glenohumeral degenerative spur formation. IMPRESSION: 1. No acute abnormality. 2. 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 3. Patchy sclerosis in the thoracic spine, compatible with sickle cell changes. Electronically Signed   By: Beckie Salts M.D.   On: 09/27/2022 14:55    Microbiology: No results found for this or any previous visit (from the past 240 hour(s)).   Labs: Basic Metabolic Panel: Recent Labs  Lab 10/14/22 1635 10/15/22 0103 10/15/22 0840 10/17/22 0604 10/17/22 2009  NA 139  --  138 139 137  K 3.1*  --  3.2* 2.8* 3.4*  CL 101  --  103 100 98  CO2 26  --  27 28 26   GLUCOSE 154*  --  168* 139* 166*  BUN 15  --  20 16 15   CREATININE 1.31* 1.42* 1.63* 1.31* 1.18  CALCIUM 8.7*  --  8.0* 8.5* 8.6*  MG  --   --   --   --  1.5*   Liver Function Tests: Recent Labs  Lab 10/14/22 1635 10/15/22 0840  AST 48* 32  ALT 53* 44  ALKPHOS 193* 158*  BILITOT 0.8 0.7  PROT 7.3 6.6  ALBUMIN 2.5* 2.3*   No results for input(s): "LIPASE", "AMYLASE" in the last 168 hours. No results  for input(s): "AMMONIA" in the last 168 hours. CBC: Recent Labs  Lab 10/14/22 1635 10/15/22 0103 10/15/22 0840 10/15/22 1315 10/16/22 0536 10/17/22 0604  WBC 13.3* 12.8* 10.6*  --  9.6 13.9*  NEUTROABS 10.0*  --  7.3  --   --   --   HGB 5.5* 5.5* 6.2* 6.7* 7.2* 7.9*  HCT 17.0* 16.6* 18.8*  20.2* 21.7* 24.1*  MCV 73.0* 73.1* 74.9*  --  75.3* 76.8*  PLT 645* 602* 541*  --  483* 613*   Cardiac Enzymes: No results for input(s): "CKTOTAL", "CKMB", "CKMBINDEX", "TROPONINI" in the last 168 hours. BNP: Invalid input(s): "POCBNP" CBG: Recent Labs  Lab 10/17/22 1203 10/17/22 1657 10/17/22 2126 10/18/22 0730 10/18/22 1200  GLUCAP 101* 131* 148* 158* 119*    Time coordinating discharge: 50 minutes  Signed:  Edmund Holcomb  [facility] 10/18/2022, 2:00 PM

## 2022-10-20 ENCOUNTER — Telehealth: Payer: Self-pay

## 2022-10-20 NOTE — Patient Outreach (Signed)
  Care Coordination TOC Note Transition Care Management Unsuccessful Follow-up Telephone Call  Date of discharge and from where:  10/18/22-[facility]  Attempts:  1st Attempt  Reason for unsuccessful TCM follow-up call:  Left voice message     Marvion Bastidas, RN,BSN,CCM [facility] Care Management Telephonic Care Management Coordinator Direct Phone: [phone] Toll Free: [phone] Fax: [phone]   

## 2022-10-20 NOTE — Telephone Encounter (Signed)
Transition Care Management Follow-up Telephone Call Date of discharge and from where: 10/18/2022 [place]  How have you been since you were released from the hospital? Pt states he is doing better. Any questions or concerns? No  Items Reviewed: Did the pt receive and understand the discharge instructions provided? Yes  Medications obtained and verified? Yes  Other? No  Any new allergies since your discharge? No  Dietary orders reviewed? Yes Do you have support at home? Yes   Home Care and Equipment/Supplies: Were home health services ordered? no If so, what is the name of the agency? N/a  Has the agency set up a time to come to the patient's home? no Were any new equipment or medical supplies ordered?  No What is the name of the medical supply agency? N/a Were you able to get the supplies/equipment? no Do you have any questions related to the use of the equipment or supplies? No  Functional Questionnaire: (I = Independent and D = Dependent) ADLs: i  Bathing/Dressing- i  Meal Prep- i  Eating- i  Maintaining continence- i  Transferring/Ambulation- i  Managing Meds- i  Follow up appointments reviewed:  PCP Hospital f/u appt confirmed? Yes  Scheduled to see robyn sanders on n/a @ n/a. [facility] f/u appt confirmed? No  Scheduled to see n/a on n/a @ n/a. Are transportation arrangements needed? No  If their condition worsens, is the pt aware to call PCP or go to the Emergency Dept.? Yes Was the patient provided with contact information for the PCP's office or ED? Yes Was to pt encouraged to call back with questions or concerns? Yes

## 2022-10-23 ENCOUNTER — Encounter: Payer: 59 | Admitting: Nurse Practitioner

## 2022-10-23 ENCOUNTER — Inpatient Hospital Stay: Payer: Medicare Other | Admitting: Internal Medicine

## 2022-10-23 ENCOUNTER — Encounter: Payer: Self-pay | Admitting: Internal Medicine

## 2022-10-23 NOTE — Progress Notes (Signed)
Left before being seen.

## 2022-10-27 ENCOUNTER — Ambulatory Visit: Payer: Medicare Other | Admitting: Internal Medicine

## 2022-10-31 ENCOUNTER — Other Ambulatory Visit: Payer: Self-pay | Admitting: Cardiology

## 2022-11-03 DIAGNOSIS — N182 Chronic kidney disease, stage 2 (mild): Secondary | ICD-10-CM | POA: Diagnosis not present

## 2022-11-10 DIAGNOSIS — E1122 Type 2 diabetes mellitus with diabetic chronic kidney disease: Secondary | ICD-10-CM | POA: Diagnosis not present

## 2022-11-10 DIAGNOSIS — R809 Proteinuria, unspecified: Secondary | ICD-10-CM | POA: Diagnosis not present

## 2022-11-10 DIAGNOSIS — N182 Chronic kidney disease, stage 2 (mild): Secondary | ICD-10-CM | POA: Diagnosis not present

## 2022-11-10 DIAGNOSIS — I129 Hypertensive chronic kidney disease with stage 1 through stage 4 chronic kidney disease, or unspecified chronic kidney disease: Secondary | ICD-10-CM | POA: Diagnosis not present

## 2022-11-24 DIAGNOSIS — N182 Chronic kidney disease, stage 2 (mild): Secondary | ICD-10-CM | POA: Diagnosis not present

## 2023-02-03 ENCOUNTER — Other Ambulatory Visit: Payer: Self-pay | Admitting: Internal Medicine

## 2023-02-16 ENCOUNTER — Ambulatory Visit (INDEPENDENT_AMBULATORY_CARE_PROVIDER_SITE_OTHER): Payer: 59 | Admitting: Nurse Practitioner

## 2023-02-16 ENCOUNTER — Encounter: Payer: Self-pay | Admitting: Nurse Practitioner

## 2023-02-16 VITALS — BP 140/90 | HR 98 | Temp 98.2°F | Ht 70.0 in | Wt 220.0 lb

## 2023-02-16 DIAGNOSIS — E1122 Type 2 diabetes mellitus with diabetic chronic kidney disease: Secondary | ICD-10-CM | POA: Diagnosis not present

## 2023-02-16 DIAGNOSIS — I129 Hypertensive chronic kidney disease with stage 1 through stage 4 chronic kidney disease, or unspecified chronic kidney disease: Secondary | ICD-10-CM

## 2023-02-16 DIAGNOSIS — N182 Chronic kidney disease, stage 2 (mild): Secondary | ICD-10-CM | POA: Diagnosis not present

## 2023-02-16 DIAGNOSIS — R300 Dysuria: Secondary | ICD-10-CM | POA: Diagnosis not present

## 2023-02-16 DIAGNOSIS — Z23 Encounter for immunization: Secondary | ICD-10-CM

## 2023-02-16 DIAGNOSIS — Z6831 Body mass index (BMI) 31.0-31.9, adult: Secondary | ICD-10-CM

## 2023-02-16 DIAGNOSIS — E6609 Other obesity due to excess calories: Secondary | ICD-10-CM

## 2023-02-16 DIAGNOSIS — D571 Sickle-cell disease without crisis: Secondary | ICD-10-CM

## 2023-02-16 LAB — POCT URINALYSIS DIPSTICK
Bilirubin, UA: NEGATIVE
Glucose, UA: NEGATIVE
Ketones, UA: NEGATIVE
Nitrite, UA: NEGATIVE
Protein, UA: POSITIVE — AB
Spec Grav, UA: 1.025 (ref 1.010–1.025)
Urobilinogen, UA: 0.2 E.U./dL
pH, UA: 6 (ref 5.0–8.0)

## 2023-02-16 MED ORDER — DOXYCYCLINE MONOHYDRATE 100 MG PO CAPS
100.0000 mg | ORAL_CAPSULE | Freq: Two times a day (BID) | ORAL | 0 refills | Status: DC
Start: 2023-02-16 — End: 2023-08-13

## 2023-02-16 NOTE — Progress Notes (Signed)
Hershal Coria Martin,acting as a Neurosurgeon for Arnette Felts, FNP.,have documented all relevant documentation on the behalf of Arnette Felts, FNP,as directed by  Arnette Felts, FNP while in the presence of Arnette Felts, FNP.    Subjective:     Patient ID: Nicolas [name] , male    DOB: June 29, 1976 , 47 y.o.   MRN: [id number]   Chief Complaint  Patient presents with   Diabetes   Hypertension    HPI  Patient presents today for a BP and DM check, patient reports compliance with medications. Patient reports when he urinates he feels burning. Patient reports taking [employer]- Peak Testosterone for the last month but has now stopped. Patient reports no other sexual partners other than his wife, patient reports he had sex with his wife yesterday and ever since has felt burning. Has noticed dark brown urine in the last few days.   BP Readings from Last 3 Encounters: 02/16/23 : (!) 130/100 10/18/22 : (!) 143/88 09/27/22 : 136/86  He is currently in back pain after getting hurt at work with a palate falling on him. 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 He is being followed by hematology at [facility] for sickle cell.   Diabetes He presents for his follow-up diabetic visit. He has type 2 diabetes mellitus. There are no hypoglycemic associated symptoms. There are no diabetic associated symptoms. There are no hypoglycemic complications. Risk factors for coronary artery disease include diabetes mellitus, male sex, obesity, hypertension and dyslipidemia. His weight is stable. When asked about meal planning, he reported none. He has not had a previous visit with a dietitian. (Reports blood sugar has been good but does not know the readings. ) Eye exam current: he needs to call Dr. Karleen Hampshire for an appt..     Past Medical History:  Diagnosis Date   Arthritis    arthritis- back & L hip   Depression    situational depression, pt. out of work    Diabetes mellitus (HCC)     HTN (hypertension) 05/10/2015   Osteoarthritis of left hip 06/07/2012   Sickle cell anemia (HCC)      Family History  Problem Relation Age of Onset   Diabetes Mother    Cancer - Other Father    Liver cancer Father    CAD Neg Hx     No current facility-administered medications for this visit.  Current Outpatient Medications:    HYDROcodone-acetaminophen (NORCO/VICODIN) 5-325 MG tablet, Take 1 tablet by mouth every 4 (four) hours as needed for moderate pain., Disp: 20 tablet, Rfl: 0  Facility-Administered Medications Ordered in Other Visits:    0.9 %  sodium chloride infusion, , Intravenous, Continuous, Linton Rump, MD, Last Rate: 10 mL/hr at 02/26/23 1130, Restarted at 02/26/23 1155   amisulpride (BARHEMSYS) injection 10 mg, 10 mg, Intravenous, Once PRN, Linton Rump, MD   fentaNYL (SUBLIMAZE) injection 25-50 mcg, 25-50 mcg, Intravenous, Q5 min PRN, Linton Rump, MD   insulin aspart (novoLOG) injection 0-14 Units, 0-14 Units, Subcutaneous, Q2H PRN, Linton Rump, MD   No Known Allergies   Review of Systems  Constitutional: Negative.   Respiratory: Negative.    Cardiovascular: Negative.   Genitourinary:  Positive for frequency and penile pain.  Neurological: Negative.   Psychiatric/Behavioral: Negative.       Today's Vitals   02/16/23 1104 02/16/23 1204  BP: (!) 130/100 (!) 140/90  Pulse: 98   Temp: 98.2 F (36.8 C)  TempSrc: Oral   Weight: 220 lb (99.8 kg)   Height: 5\' 10"  (1.778 m)    Body mass index is 31.57 kg/m.  Wt Readings from Last 3 Encounters:  02/26/23 215 lb (97.5 kg)  02/25/23 222 lb 6.4 oz (100.9 kg)  02/24/23 222 lb 3.2 oz (100.8 kg)    Objective:  Physical Exam Vitals reviewed.  Constitutional:      Appearance: Normal appearance. He is obese.  Cardiovascular:     Rate and Rhythm: Normal rate and regular rhythm.     Pulses: Normal pulses.     Heart sounds: Normal heart sounds. No murmur  heard. Pulmonary:     Effort: Pulmonary effort is normal. No respiratory distress.     Breath sounds: Normal breath sounds.  Musculoskeletal:        General: No swelling.  Skin:    General: Skin is warm and dry.     Capillary Refill: Capillary refill takes less than 2 seconds.  Neurological:     General: No focal deficit present.     Mental Status: He is alert and oriented to person, place, and time.  Psychiatric:        Mood and Affect: Mood normal.        Behavior: Behavior normal.        Thought Content: Thought content normal.        Judgment: Judgment normal.         Assessment And Plan:     1. Hypertensive nephropathy Comments: Blood pressure is elevated improved with recheck but still elevated, advised to limit salt intake and stay adequately hydrated. - Basic metabolic panel  2. Type 2 diabetes mellitus with stage 2 chronic kidney disease, without long-term current use of insulin (HCC) Comments: Diabetic foot exam done, will check his HgbA1c overall he feels like he is doing well - Basic metabolic panel - Hemoglobin A1c - Microalbumin / Creatinine Urine Ratio  3. Burning with urination Comments: Due to his urinary symptoms and history of diabetes will treat for UTI with Doxycycline, urine culture pending. - POCT Urinalysis Dipstick (81002) - Culture, Urine - doxycycline (MONODOX) 100 MG capsule; Take 1 capsule (100 mg total) by mouth 2 (two) times daily.  Dispense: 14 capsule; Refill: 0  4. Class 1 obesity due to excess calories with body mass index (BMI) of 31.0 to 31.9 in adult, unspecified whether serious comorbidity present he is encouraged to strive for BMI less than 30 to decrease cardiac risk. Advised to aim for at least 150 minutes of exercise per week.  5. Sickle cell disease without crisis (HCC) Comments: Continue f/u with [facility]  6. Need for Tdap vaccination - Tdap vaccine greater than or equal to 7yo IM    Return for 4 months diabetes/HTN.   Patient was given opportunity to ask questions. Patient verbalized understanding of the plan and was able to repeat key elements of the plan. All questions were answered to their satisfaction.  Arnette Felts, FNP   I, Arnette Felts, FNP, have reviewed all documentation for this visit. The documentation on 02/16/23 for the exam, diagnosis, procedures, and orders are all accurate and complete.   IF YOU HAVE BEEN REFERRED TO A SPECIALIST, IT MAY TAKE 1-2 WEEKS TO SCHEDULE/PROCESS THE REFERRAL. IF YOU HAVE NOT HEARD FROM US/SPECIALIST IN TWO WEEKS, PLEASE GIVE [place] A CALL AT [phone].   THE PATIENT IS ENCOURAGED TO PRACTICE SOCIAL DISTANCING DUE TO THE COVID-19 PANDEMIC.

## 2023-02-17 LAB — MICROALBUMIN / CREATININE URINE RATIO
Creatinine, Urine: 228.3 mg/dL
Microalb/Creat Ratio: 923 mg/g creat — ABNORMAL HIGH (ref 0–29)
Microalbumin, Urine: 2107.6 ug/mL

## 2023-02-17 LAB — BASIC METABOLIC PANEL
BUN/Creatinine Ratio: 11 (ref 9–20)
BUN: 15 mg/dL (ref 6–24)
CO2: 23 mmol/L (ref 20–29)
Calcium: 9.5 mg/dL (ref 8.7–10.2)
Chloride: 105 mmol/L (ref 96–106)
Creatinine, Ser: 1.38 mg/dL — ABNORMAL HIGH (ref 0.76–1.27)
Glucose: 148 mg/dL — ABNORMAL HIGH (ref 70–99)
Potassium: 3.9 mmol/L (ref 3.5–5.2)
Sodium: 146 mmol/L — ABNORMAL HIGH (ref 134–144)
eGFR: 63 mL/min/{1.73_m2} (ref >59)

## 2023-02-17 LAB — URINE CULTURE: Organism ID, Bacteria: NO GROWTH

## 2023-02-17 LAB — HEMOGLOBIN A1C
Est. average glucose Bld gHb Est-mCnc: <74 mg/dL
Hgb A1c MFr Bld: <4.2 % — ABNORMAL LOW (ref 4.8–5.6)

## 2023-02-18 DIAGNOSIS — H538 Other visual disturbances: Secondary | ICD-10-CM | POA: Diagnosis not present

## 2023-02-18 DIAGNOSIS — H3522 Other non-diabetic proliferative retinopathy, left eye: Secondary | ICD-10-CM | POA: Diagnosis not present

## 2023-02-18 DIAGNOSIS — H5340 Unspecified visual field defects: Secondary | ICD-10-CM | POA: Diagnosis not present

## 2023-02-18 DIAGNOSIS — H3342 Traction detachment of retina, left eye: Secondary | ICD-10-CM | POA: Diagnosis not present

## 2023-02-19 ENCOUNTER — Ambulatory Visit (INDEPENDENT_AMBULATORY_CARE_PROVIDER_SITE_OTHER): Payer: 59 | Admitting: Ophthalmology

## 2023-02-19 ENCOUNTER — Encounter (INDEPENDENT_AMBULATORY_CARE_PROVIDER_SITE_OTHER): Payer: Self-pay | Admitting: Ophthalmology

## 2023-02-19 DIAGNOSIS — H35033 Hypertensive retinopathy, bilateral: Secondary | ICD-10-CM | POA: Diagnosis not present

## 2023-02-19 DIAGNOSIS — H36821 Proliferative sickle-cell retinopathy, right eye: Secondary | ICD-10-CM

## 2023-02-19 DIAGNOSIS — H36829 Proliferative sickle-cell retinopathy, unspecified eye: Secondary | ICD-10-CM | POA: Diagnosis not present

## 2023-02-19 DIAGNOSIS — D571 Sickle-cell disease without crisis: Secondary | ICD-10-CM | POA: Diagnosis not present

## 2023-02-19 DIAGNOSIS — I10 Essential (primary) hypertension: Secondary | ICD-10-CM | POA: Diagnosis not present

## 2023-02-19 DIAGNOSIS — H25813 Combined forms of age-related cataract, bilateral: Secondary | ICD-10-CM | POA: Diagnosis not present

## 2023-02-19 DIAGNOSIS — H3322 Serous retinal detachment, left eye: Secondary | ICD-10-CM | POA: Diagnosis not present

## 2023-02-19 DIAGNOSIS — E119 Type 2 diabetes mellitus without complications: Secondary | ICD-10-CM

## 2023-02-19 LAB — HM DIABETES EYE EXAM

## 2023-02-19 MED ORDER — PREDNISOLONE ACETATE 1 % OP SUSP: 1.0000 [drp] | Freq: Four times a day (QID) | OPHTHALMIC | 0 refills | Status: AC

## 2023-02-19 NOTE — Progress Notes (Signed)
[facility] - Clinic Note  02/19/2023   CHIEF COMPLAINT Patient presents for Retina Evaluation  HISTORY OF PRESENT ILLNESS: Nicolas [name] is a 47 y.o. male who presents to the clinic today for:  HPI     Retina Evaluation   In left eye.  This started 1 month ago.  Duration of 1 month.  Associated Symptoms Distortion.  Context:  distance vision, mid-range vision and near vision.  Response to treatment was no improvement.  I, the attending physician,  performed the HPI with the patient and updated documentation appropriately.        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 Pt states 1 mo ago he wasn't able to see well out of OS. Said that he was out in the sun then came back inside and noticed a VA change in OS. No FOL or floaters. Rx specs are 49 year old. Pt is diabetic 7-8 years, last A1C taken 02/16/23 was 4.2.       Last edited by [name], MD on 02/19/2023  8:28 AM.    Pt is here on the referral of Dr. Karleen Hampshire for concern of TRD OS, pt saw him yesterday, but states the left eye vision has been decreased for about a month, pt states he is diabetic and sees Dr. Karleen Hampshire every year and has never been told that he has diabetes in his eyes, pt also has sickle cell disease, he states he had a crisis in December   Referring physician: Aura Camps, MD [address] [place],  [place] [address]  HISTORICAL INFORMATION:  Selected notes from the MEDICAL RECORD NUMBER Referred by Dr. Karleen Hampshire for TRD OS LEE:  Ocular Hx- PMH-   CURRENT MEDICATIONS: Current Outpatient Medications (Ophthalmic Drugs)  Medication Sig   prednisoLONE acetate (PRED FORTE) 1 % ophthalmic suspension Place 1 drop into the right eye 4 (four) times daily for 7 days.   No current facility-administered medications for this visit. (Ophthalmic Drugs)   Current Outpatient Medications (Other)  Medication Sig   amLODipine (NORVASC) 10 MG tablet TAKE 1  TABLET BY MOUTH EVERY DAY (Patient taking differently: Take 10 mg by mouth daily.)   carvedilol (COREG) 6.25 MG tablet TAKE 1 TABLET BY MOUTH TWICE A DAY WITH MEALS (Patient taking differently: Take 6.25 mg by mouth 2 (two) times daily with a meal.)   Cholecalciferol (VITAMIN D3) 2000 units capsule Take 2,000 Units by mouth daily.   cyclobenzaprine (FLEXERIL) 10 MG tablet Take 10 mg by mouth 3 (three) times daily as needed for muscle spasms.   diclofenac Sodium (VOLTAREN) 1 % GEL Apply 2 g topically 3 (three) times daily as needed.   doxycycline (MONODOX) 100 MG capsule Take 1 capsule (100 mg total) by mouth 2 (two) times daily.   folic acid (FOLVITE) 1 MG tablet Take 1 tablet (1 mg total) by mouth daily.   glucose blood test strip Use as instructed (Patient taking differently: 1 each by Other route every other day. Use as instructed)   ibuprofen (ADVIL) 800 MG tablet Take 1 tablet (800 mg total) by mouth every 8 (eight) hours as needed.   Lancets (ACCU-CHEK MULTICLIX) lancets Use as instructed (Patient taking differently: 1 each by Other route every other day.)   metFORMIN (GLUCOPHAGE) 500 MG tablet TAKE 1 TABLET BY MOUTH TWICE A DAY (Patient taking differently: Take 500 mg by mouth 2 (two) times daily with a meal.)  Multiple Vitamin (ONE-A-DAY MENS PO) Take 1 tablet by mouth daily with breakfast. 1 daily   OXBRYTA 500 MG TABS tablet Take 1,500 mg by mouth daily.   oxyCODONE-acetaminophen (PERCOCET) 7.5-325 MG tablet Take 1 tablet by mouth every 4 (four) hours as needed for severe pain.   OZEMPIC, 0.25 OR 0.5 MG/DOSE, 2 MG/1.5ML SOPN INJECT 0.5 MG INTO THE SKIN ONCE A WEEK. (Patient taking differently: Inject 0.5 mg into the skin every Friday.)   pravastatin (PRAVACHOL) 20 MG tablet TAKE 1 TABLET BY MOUTH EVERY DAY IN THE EVENING   triamterene-hydrochlorothiazide (MAXZIDE-25) 37.5-25 MG tablet TAKE 1 TABLET BY MOUTH EVERY DAY   valsartan (DIOVAN) 160 MG tablet Take 1 tablet (160 mg total) by mouth  daily.   No current facility-administered medications for this visit. (Other)   REVIEW OF SYSTEMS: ROS   Positive for: Endocrine, Cardiovascular, Eyes Negative for: Constitutional, Gastrointestinal, Neurological, Skin, Genitourinary, Musculoskeletal, HENT, Respiratory, Psychiatric, Allergic/Imm, Heme/Lymph Last edited by Thompson Grayer, COT on 02/19/2023  8:09 AM.     ALLERGIES No Known Allergies PAST MEDICAL HISTORY Past Medical History:  Diagnosis Date   Arthritis    arthritis- back & L hip   Depression    situational depression, pt. out of work    Diabetes mellitus (HCC)    HTN (hypertension) 05/10/2015   Osteoarthritis of left hip 06/07/2012   Sickle cell anemia (HCC)    Past Surgical History:  Procedure Laterality Date   IR GENERIC HISTORICAL  09/26/2016   IR FLUORO GUIDE CV LINE RIGHT 09/26/2016 Irish Lack, MD [facility]   IR GENERIC HISTORICAL  09/26/2016   IR US GUIDE VASC ACCESS RIGHT 09/26/2016 Irish Lack, MD [facility]   JOINT REPLACEMENT     R hip   TOTAL HIP ARTHROPLASTY  06/07/2012   Procedure: TOTAL HIP ARTHROPLASTY;  Surgeon: Eulas Post, MD;  Location: [facility] OR;  Service: Orthopedics;  Laterality: Left;   FAMILY HISTORY Family History  Problem Relation Age of Onset   Cancer - Other Father    Liver cancer Father    Diabetes Mother    CAD Neg Hx    SOCIAL HISTORY Social History   Tobacco Use   Smoking status: Never   Smokeless tobacco: Never  Vaping Use   Vaping Use: Never used  Substance Use Topics   Alcohol use: Not Currently    Comment: ocassional    Drug use: Yes    Types: Oxycodone       OPHTHALMIC EXAM:  Base Eye Exam     Visual Acuity (Snellen - Linear)       Right Left   Dist cc 20/20 -1 20/70 -1   Dist ph cc  NI    Correction: Glasses         Tonometry (Tonopen, 8:22 AM)       Right Left   Pressure 14 12         Pupils       Pupils Dark Light Shape React APD   Right PERRL 3 2 Round Brisk None    Left PERRL 3 3 Round Minimal None         Visual Fields (Counting fingers)       Left Right     Full   Restrictions Partial outer superior temporal deficiency          Extraocular Movement       Right Left    Full, Ortho Full, Ortho  Neuro/Psych     Oriented x3: Yes   Mood/Affect: Normal         Dilation     Both eyes: 1.0% Mydriacyl, 2.5% Phenylephrine @ 8:23 AM           Slit Lamp and Fundus Exam     Slit Lamp Exam       Right Left   Lids/Lashes Dermatochalasis - upper lid Dermatochalasis - upper lid   Conjunctiva/Sclera 1+ Injection, Melanosis 1+ Injection, Melanosis   Cornea Clear Clear   Anterior Chamber deep and clear deep and clear   Iris Round and dilated, No NVI Round and dilated, No NVI   Lens 1+ Nuclear sclerosis, 1-2+ Cortical cataract 2+ Nuclear sclerosis, 2+ Cortical cataract   Anterior Vitreous clear Partial Posterior vitreous detachment, +fibrosis         Fundus Exam       Right Left   Disc Pink and Sharp, +PPP Pink and Sharp, +fibrosis, mild PPP   C/D Ratio 0.2 0.4   Macula Flat, Blunted foveal reflex, RPE mottling, No heme or edema +shallow SRF, Good foveal reflex, ERM with striae, macula shallowly detached   Vessels attenuated, mild tortuosity, fibrotic NV IT, early NV nasal periphery attenuated, Tortuous, AV crossing changes, fibrotic NVE IT periphery with surrounding heme, fibrotic NVE nasal periphery, ?stretch hole at 0800   Periphery Attached total TRD greatest nasal and inferior quads; fibrotic NVE nasal and IT periphery; ?stretch hole at 0800           Refraction     Wearing Rx       Sphere Cylinder Axis Add   Right +1.25 +0.25 086 +2.50   Left +1.25 +0.25 093 +2.50         Manifest Refraction       Sphere Cylinder Axis Dist VA   Right       Left +0.75 +0.25 020 20/60           IMAGING AND PROCEDURES  Imaging and Procedures for 02/19/2023  OCT, Retina - OU - Both Eyes       Right  Eye Quality was good. Central Foveal Thickness: 221. Progression has no prior data. Findings include normal foveal contour, no IRF, no SRF, vitreomacular adhesion .   Left Eye Quality was good. Central Foveal Thickness: 414. Progression has no prior data. Findings include no IRF, abnormal foveal contour, epiretinal membrane, macular pucker, subretinal fluid (Near total RD with SRF greatest nasal and inferior periphery, +foveal involvement).   Notes *Images captured and stored on drive  Diagnosis / Impression:  OD: NFP, no IRF/SRF OS: Near total RD with SRF greatest nasal and inferior periphery, +foveal involvement; +ERM w/ pucker  Clinical management:  See below  Abbreviations: NFP - Normal foveal profile. CME - cystoid macular edema. PED - pigment epithelial detachment. IRF - intraretinal fluid. SRF - subretinal fluid. EZ - ellipsoid zone. ERM - epiretinal membrane. ORA - outer retinal atrophy. ORT - outer retinal tubulation. SRHM - subretinal hyper-reflective material. IRHM - intraretinal hyper-reflective material      Fluorescein Angiography Optos (Transit OS)       Right Eye Progression has no prior data. Early phase findings include leakage, retinal neovascularization. Mid/Late phase findings include leakage, retinal neovascularization (Focal NV IT and nasal periphery).   Left Eye Progression has no prior data. Early phase findings include delayed filling, retinal neovascularization. Mid/Late phase findings include leakage, retinal neovascularization (Focal NV nasal and temporal periphery).   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   Lumenis [id number] slit lamp laser Pattern: 2x2 square Power: 260 mW Duration: 30 msec  Spot size: 200 microns  # spots: 459 spots to areas of vascular nonperfusion surrounding focal NVE at 0300 and 0800  Complications: None.  RTC: Friday for POV1  Patient tolerated the procedure well and received written and verbal post-procedure care information/education.          ASSESSMENT/PLAN:   ICD-10-CM   1. Left retinal detachment  H33.22     2. 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 Sickle cell disease without crisis (HCC)  D57.1     4. Essential hypertension  I10     5. Hypertensive retinopathy of both eyes  H35.033 OCT, Retina - OU - Both Eyes    Fluorescein Angiography Optos (Transit OS)    6. Diabetes mellitus type 2 without retinopathy (HCC)  E11.9     7. Combined forms of age-related cataract of both eyes  H25.813      Retinal detachment, OS - pt reports ~1 month history of decreased vision OS - BCVA 20/60 OS  - exam shows near total tractional detachment w/ ?rhegmatogenous component -- detachment greatest in nasal and inferior quadrants  - focal areas of tractional fibrosis nasal and inferotemporal periphery w/ associated neovascularization  - ?stretch hole at 0800 - pt with history of sickle cell disease ([place]) -- retinopathy likely etiology of traction and fibrosis - The incidence, risk factors, and natural history of retinal detachment was discussed with patient.   - Potential treatment options including delimiting laser, pneumatic retinopexy, scleral buckle, and vitrectomy, cryotherapy and laser, and the use of air, gas, and  oil discussed with patient. - The risks of blindness, loss of vision, infection, hemorrhage, cataract progression or lens displacement were discussed with patient. - recommend scleral buckle + 25g PPV/EL/Gas vs oil OS under general anesthesia - pt wishes to proceed with surgery - RBA of procedure discussed, questions answered - informed consent obtained and signed - case scheduled for Feb 26, 2023 at 11:30am [facility] OR 8 - f/u POD1 - Friday 05.31.24  2,3. Proliferative retinopathy from Sickle cell disease OU  - examination and FA show areas of neovascularization OU (OS > OD)  - recommend PRP OD today, 05.23.24 to areas of neovascularization  - pt wishes to proceed with laser  - RBA of procedure discussed, questions answered - informed consent obtained and signed - see procedure note - start PF QID OD x7 days  4,5. Hypertensive retinopathy OU - discussed importance of tight BP control - monitor  6. Diabetes mellitus, type 2 without retinopathy  - A1c 4.2 on 05.20.24  - FA 05.23.24 with no significant MA OU - The incidence, risk factors for progression, natural history and treatment options for diabetic retinopathy  were  discussed with patient.   - The need for close monitoring of blood glucose, blood pressure, and serum lipids, avoiding cigarette or any type of tobacco, and the need for long term follow up was also discussed with patient. - f/u in 1 year, sooner prn  7. Mixed Cataract OU - The symptoms of cataract, surgical options, and treatments and risks were discussed with patient. - discussed diagnosis and likely progression post PPV - monitor  Ophthalmic Meds Ordered this visit:  Meds ordered this encounter  Medications   prednisoLONE acetate (PRED FORTE) 1 % ophthalmic suspension    Sig: Place 1 drop into the right eye 4 (four) times daily for 7 days.    Dispense:  5 mL    Refill:  0     Return in about 8 days (around 02/27/2023) for POV OS -- TRD OS from Proliferative  retinopathy 2/2 Sickle Cell Disease.  There are no Patient Instructions on file for this visit.  Explained the diagnoses, plan, and follow up with the patient and they expressed understanding.  Patient expressed understanding of the importance of proper follow up care.   This document serves as a record of services personally performed by Karie Chimera, MD, PhD. It was created on their behalf by Glee Arvin. Manson Passey, OA an ophthalmic technician. The creation of this record is the provider's dictation and/or activities during the visit.    Electronically signed by: Glee Arvin. Manson Passey, [place] 05.23.2024 2:34 PM  Karie Chimera, M.D., Ph.D. Diseases & Surgery of the Retina and Vitreous [facility] 02/19/2023  I have reviewed the above documentation for accuracy and completeness, and I agree with the above. Karie Chimera, M.D., Ph.D. 02/19/23 3:12 PM   Abbreviations: M myopia (nearsighted); A astigmatism; H hyperopia (farsighted); P presbyopia; Mrx spectacle prescription;  CTL contact lenses; OD right eye; OS left eye; OU both eyes  XT exotropia; ET esotropia; PEK punctate epithelial keratitis; PEE punctate epithelial erosions; DES dry eye syndrome; MGD meibomian gland dysfunction; ATs artificial tears; PFAT's preservative free artificial tears; NSC nuclear sclerotic cataract; PSC posterior subcapsular cataract; ERM epi-retinal membrane; PVD posterior vitreous detachment; RD retinal detachment; DM diabetes mellitus; DR diabetic retinopathy; NPDR non-proliferative diabetic retinopathy; PDR proliferative diabetic retinopathy; CSME clinically significant macular edema; DME diabetic macular edema; dbh dot blot hemorrhages; CWS cotton wool spot; POAG primary open angle glaucoma; C/D cup-to-disc ratio; HVF humphrey visual field; GVF goldmann visual field; OCT optical coherence tomography; IOP intraocular pressure; BRVO Branch retinal vein occlusion; CRVO central retinal vein occlusion; CRAO  central retinal artery occlusion; BRAO branch retinal artery occlusion; RT retinal tear; SB scleral buckle; PPV pars plana vitrectomy; VH Vitreous hemorrhage; PRP panretinal laser photocoagulation; IVK intravitreal kenalog; VMT vitreomacular traction; MH Macular hole;  NVD neovascularization of the disc; NVE neovascularization elsewhere; AREDS age related eye disease study; ARMD age related macular degeneration; POAG primary open angle glaucoma; EBMD epithelial/anterior basement membrane dystrophy; ACIOL anterior chamber intraocular lens; IOL intraocular lens; PCIOL posterior chamber intraocular lens; Phaco/IOL phacoemulsification with intraocular lens placement; PRK photorefractive keratectomy; LASIK laser assisted in situ keratomileusis; HTN hypertension; DM diabetes mellitus; COPD chronic obstructive pulmonary disease

## 2023-02-24 ENCOUNTER — Encounter: Payer: Self-pay | Admitting: Internal Medicine

## 2023-02-24 ENCOUNTER — Ambulatory Visit: Payer: 59 | Admitting: Cardiology

## 2023-02-24 ENCOUNTER — Encounter: Payer: Self-pay | Admitting: Cardiology

## 2023-02-24 VITALS — BP 117/71 | HR 83 | Resp 16 | Ht 70.0 in | Wt 222.2 lb

## 2023-02-24 DIAGNOSIS — E1122 Type 2 diabetes mellitus with diabetic chronic kidney disease: Secondary | ICD-10-CM | POA: Diagnosis not present

## 2023-02-24 DIAGNOSIS — E7800 Pure hypercholesterolemia, unspecified: Secondary | ICD-10-CM | POA: Diagnosis not present

## 2023-02-24 DIAGNOSIS — N1831 Chronic kidney disease, stage 3a: Secondary | ICD-10-CM | POA: Diagnosis not present

## 2023-02-24 DIAGNOSIS — I129 Hypertensive chronic kidney disease with stage 1 through stage 4 chronic kidney disease, or unspecified chronic kidney disease: Secondary | ICD-10-CM | POA: Diagnosis not present

## 2023-02-24 DIAGNOSIS — Z01818 Encounter for other preprocedural examination: Secondary | ICD-10-CM

## 2023-02-24 DIAGNOSIS — I10 Essential (primary) hypertension: Secondary | ICD-10-CM

## 2023-02-24 NOTE — Progress Notes (Signed)
Surgical Instructions    Your procedure is scheduled on Thursday May 30th.  Report to [facility] Main Entrance "A" at 9:30 A.M., then check in with the Admitting office.  Call this number if you have problems the morning of surgery:  [phone]   If you have any questions prior to your surgery date call [phone]: Open Monday-Friday 8am-4pm If you experience any cold or flu symptoms such as cough, fever, chills, shortness of breath, etc. between now and your scheduled surgery, please notify us at the above number     Remember:  Do not eat after midnight the night before your surgery  You may drink clear liquids until 8:30am the morning of your surgery.   Clear liquids allowed are: Water, Non-Citrus Juices (without pulp), Carbonated Beverages, Clear Tea, Black Coffee ONLY (NO MILK, CREAM OR POWDERED CREAMER of any kind), and Gatorade    Take these medicines the morning of surgery with A SIP OF WATER: amLODipine (NORVASC) 10 MG tablet  carvedilol (COREG) 6.25 MG tablet  doxycycline (MONODOX) 100 MG capsule  OXBRYTA  prednisoLONE acetate (PRED FORTE) 1 % ophthalmic suspension    IF NEEDED   oxyCODONE-acetaminophen (PERCOCET) 7.5-325 MG tablet      As of today, STOP taking any Aspirin (unless otherwise instructed by your surgeon) Aleve, Naproxen, Ibuprofen, Motrin, Advil, Goody's, BC's, all herbal medications, fish oil, and all vitamins.  WHAT DO I DO ABOUT MY DIABETES MEDICATION?   Do not take oral diabetes medicines (Metformin) the morning of surgery.  DO TAKE YOUR OZEMPIC 7 days prior to surgery   HOW TO MANAGE YOUR DIABETES BEFORE AND AFTER SURGERY  Why is it important to control my blood sugar before and after surgery? Improving blood sugar levels before and after surgery helps healing and can limit problems. A way of improving blood sugar control is eating a healthy diet by:  Eating less sugar and carbohydrates  Increasing activity/exercise  Talking with your  doctor about reaching your blood sugar goals High blood sugars (greater than 180 mg/dL) can raise your risk of infections and slow your recovery, so you will need to focus on controlling your diabetes during the weeks before surgery. Make sure that the doctor who takes care of your diabetes knows about your planned surgery including the date and location.  How do I manage my blood sugar before surgery? Check your blood sugar at least 4 times a day, starting 2 days before surgery, to make sure that the level is not too high or low.  Check your blood sugar the morning of your surgery when you wake up and every 2 hours until you get to the Short Stay unit.  If your blood sugar is less than 70 mg/dL, you will need to treat for low blood sugar: Do not take insulin. Treat a low blood sugar (less than 70 mg/dL) with  cup of clear juice (cranberry or apple), 4 glucose tablets, OR glucose gel. Recheck blood sugar in 15 minutes after treatment (to make sure it is greater than 70 mg/dL). If your blood sugar is not greater than 70 mg/dL on recheck, call [phone] for further instructions. Report your blood sugar to the short stay nurse when you get to Short Stay.  If you are admitted to the hospital after surgery: Your blood sugar will be checked by the staff and you will probably be given insulin after surgery (instead of oral diabetes medicines) to make sure you have good blood sugar levels. The goal for  blood sugar control after surgery is 80-180 mg/dL.    DAY OF SURGERY     Do not wear jewelry  Do not wear lotions, powders, cologne or deodorant. Do not shave 48 hours prior to surgery.  Men may shave face and neck. Do not bring valuables to the hospital. Do not wear nail polish  [facility] is not responsible for any belongings or valuables.    Do NOT Smoke (Tobacco/Vaping)  24 hours prior to your procedure  If you use a CPAP at night, you may bring your mask for your overnight stay.    Contacts, glasses, hearing aids, dentures or partials may not be worn into surgery, please bring cases for these belongings   For patients admitted to the hospital, discharge time will be determined by your treatment team.   Patients discharged the day of surgery will not be allowed to drive home, and someone needs to stay with them for 24 hours.   SURGICAL WAITING ROOM VISITATION Patients having surgery or a procedure may have no more than 2 support people in the waiting area - these visitors may rotate.   Children under the age of 11 must have an adult with them who is not the patient. If the patient needs to stay at the hospital during part of their recovery, the visitor guidelines for inpatient rooms apply. Pre-op nurse will coordinate an appropriate time for 1 support person to accompany patient in pre-op.  This support person may not rotate.   Please refer to [email or web address] for the visitor guidelines for Inpatients (after your surgery is over and you are in a regular room).    Special instructions:    Oral Hygiene is also important to reduce your risk of infection.  Remember - BRUSH YOUR TEETH THE MORNING OF SURGERY WITH YOUR REGULAR TOOTHPASTE   [place]- Preparing For Surgery  Before surgery, you can play an important role. Because skin is not sterile, your skin needs to be as free of germs as possible. You can reduce the number of germs on your skin by washing with CHG (chlorahexidine gluconate) Soap before surgery.  CHG is an antiseptic cleaner which kills germs and bonds with the skin to continue killing germs even after washing.     Please do not use if you have an allergy to CHG or antibacterial soaps. If your skin becomes reddened/irritated stop using the CHG.  Do not shave (including legs and underarms) for at least 48 hours prior to first CHG shower. It is OK to shave your face.  Please follow these instructions  carefully.     Shower the NIGHT BEFORE SURGERY and the MORNING OF SURGERY with CHG Soap.   If you chose to wash your hair, wash your hair first as usual with your normal shampoo. After you shampoo, rinse your hair and body thoroughly to remove the shampoo.  Then [employer] and genitals (private parts) with your normal soap and rinse thoroughly to remove soap.  After that Use CHG Soap as you would any other liquid soap. You can apply CHG directly to the skin and wash gently with a scrungie or a clean washcloth.   Apply the CHG Soap to your body ONLY FROM THE NECK DOWN.  Do not use on open wounds or open sores. Avoid contact with your eyes, ears, mouth and genitals (private parts). Wash Face and genitals (private parts)  with your normal soap.   Wash thoroughly, paying special attention to the area where your  surgery will be performed.  Thoroughly rinse your body with warm water from the neck down.  DO NOT shower/wash with your normal soap after using and rinsing off the CHG Soap.  Pat yourself dry with a CLEAN TOWEL.  Wear CLEAN PAJAMAS to bed the night before surgery  Place CLEAN SHEETS on your bed the night before your surgery  DO NOT SLEEP WITH PETS.   Day of Surgery:  Take a shower with CHG soap. Wear Clean/Comfortable clothing the morning of surgery Do not apply any deodorants/lotions.   Remember to brush your teeth WITH YOUR REGULAR TOOTHPASTE.    If you received a COVID test during your pre-op visit, it is requested that you wear a mask when out in public, stay away from anyone that may not be feeling well, and notify your surgeon if you develop symptoms. If you have been in contact with anyone that has tested positive in the last 10 days, please notify your surgeon.    Please read over the following fact sheets that you were given.

## 2023-02-24 NOTE — Progress Notes (Signed)
Primary Physician/Referring:  Dorothyann Peng, MD  Patient ID: Nicolas [name], male    DOB: 1975-10-15, 47 y.o.   MRN: [id number]  Chief Complaint  Patient presents with   Pre-op Exam        Hypertension   New Patient (Initial Visit)    Referred by [name], MD   HPI:    Nicolas [name]  is a 47 y.o.Patient with sickle cell disease and proliferative retinopathy has not been scheduled for segmental panretinal photocoagulation under general anesthesia, referred to me on urgent basis for preoperative cardiac risk stratification.  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  Patient from cardiac standpoint remains asymptomatic without chest pain or dyspnea, [facility]  Past Medical History:  Diagnosis Date   Arthritis    arthritis- back & L hip   Depression    situational depression, pt. out of work    Diabetes mellitus (HCC)    HTN (hypertension) 05/10/2015   Osteoarthritis of left hip 06/07/2012   Sickle cell anemia [facility])    Past Surgical History:  Procedure Laterality Date   IR GENERIC HISTORICAL  09/26/2016   IR FLUORO GUIDE CV LINE RIGHT 09/26/2016 Irish Lack, MD [facility]   IR GENERIC HISTORICAL  09/26/2016   IR US GUIDE VASC ACCESS RIGHT 09/26/2016 Irish Lack, MD [facility]   JOINT REPLACEMENT     R hip   TOTAL HIP ARTHROPLASTY  06/07/2012   Procedure: TOTAL HIP ARTHROPLASTY;  Surgeon: Eulas Post, MD;  Location: [facility] OR;  Service: Orthopedics;  Laterality: Left;   Family History  Problem Relation Age of Onset   Diabetes Mother    Cancer - Other Father    Liver cancer Father    CAD Neg Hx     Social History   Tobacco Use   Smoking status: Never   Smokeless tobacco: Never  Substance Use Topics   Alcohol use: Not Currently    Comment: ocassional    Marital Status: Married  ROS  Review of Systems  Cardiovascular:  Negative for chest pain, dyspnea on exertion  and leg swelling.   Objective      02/24/2023    9:42 AM 02/16/2023   12:04 PM 02/16/2023   11:04 AM  Vitals with BMI  Height 5\' 10"   5\' 10"   Weight 222 lbs 3 oz  220 lbs  BMI 31.88  31.57  Systolic 117 140 578  Diastolic 71 90 100  Pulse 83  98   SpO2: 96 %  Physical Exam Constitutional:      Appearance: He is obese.  Neck:     Vascular: No carotid bruit or JVD.  Cardiovascular:     Rate and Rhythm: Normal rate and regular rhythm.     Pulses: Intact distal pulses.     Heart sounds: Normal heart sounds. No murmur heard.    No gallop.  Pulmonary:     Effort: Pulmonary effort is normal.     Breath sounds: Normal breath sounds.  Abdominal:     General: Bowel sounds are normal.     Palpations: Abdomen is soft.  Musculoskeletal:     Right lower leg: No edema.     Left lower leg: No edema.     Laboratory examination:   Recent Labs    10/15/22 0840 10/17/22 0604 10/17/22 2009 02/16/23 1157  NA 138 139 137 146*  K 3.2* 2.8* 3.4* 3.9  CL 103  100 98 105  CO2 27 28 26 23   GLUCOSE 168* 139* 166* 148*  BUN 20 16 15 15   CREATININE 1.63* 1.31* 1.18 1.38*  CALCIUM 8.0* 8.5* 8.6* 9.5  GFRNONAA 51* >60 >60  --     Lab Results  Component Value Date   GLUCOSE 148 (H) 02/16/2023   NA 146 (H) 02/16/2023   K 3.9 02/16/2023   CL 105 02/16/2023   CO2 23 02/16/2023   BUN 15 02/16/2023   CREATININE 1.38 (H) 02/16/2023   EGFR 63 02/16/2023   CALCIUM 9.5 02/16/2023   PROT 6.6 10/15/2022   ALBUMIN 2.3 (L) 10/15/2022   LABGLOB 2.8 06/04/2022   AGRATIO 1.7 06/04/2022   BILITOT 0.7 10/15/2022   ALKPHOS 158 (H) 10/15/2022   AST 32 10/15/2022   ALT 44 10/15/2022   ANIONGAP 13 10/17/2022      Lab Results  Component Value Date   ALT 44 10/15/2022   AST 32 10/15/2022   ALKPHOS 158 (H) 10/15/2022   BILITOT 0.7 10/15/2022       Latest Ref Rng & Units 10/15/2022    8:40 AM 10/14/2022    4:35 PM 09/27/2022    2:37 PM  Hepatic Function  Total Protein 6.5 - 8.1 g/dL 6.6   7.3  6.9   Albumin 3.5 - 5.0 g/dL 2.3  2.5  3.5   AST 15 - 41 U/L 32  48  40   ALT 0 - 44 U/L 44  53  48   Alk Phosphatase 38 - 126 U/L 158  193  65   Total Bilirubin 0.3 - 1.2 mg/dL 0.7  0.8  1.1    Lab Results  Component Value Date   CHOL 122 02/20/2022   HDL 36 (L) 02/20/2022   LDLCALC 59 02/20/2022   TRIG 155 (H) 02/20/2022   CHOLHDL 3.4 02/20/2022    HEMOGLOBIN A1C Lab Results  Component Value Date   HGBA1C <4.2 (L) 02/16/2023   MPG 80 10/15/2022   Lab Results  Component Value Date   TSH 2.890 08/13/2020    Radiology:   Chest x-ray single view 09/27/2022: 1. No acute abnormality. 2. 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 3. Patchy sclerosis in the thoracic spine, compatible with sickle cell changes.  Cardiac Studies:   Echocardiogram 09/23/2016: Normal LVEF, 55 to 60% no wall motion abnormality.  Normal diastolic function. Aortic valve is trileaflet.  Trace aortic regurgitation. Normal RV systolic function and size. Normal tricuspid valve, PASP 31 mmHg. A trace pericardial effusion was identified.  EKG:   EKG 02/24/2023: Normal sinus rhythm with rate of 82 bpm, left atrial enlargement, normal axis.  LVH.  Compared to 02/20/2022, LVH is new.    Medications and allergies  No Known Allergies   Medication list   Current Outpatient Medications:    amLODipine (NORVASC) 10 MG tablet, TAKE 1 TABLET BY MOUTH EVERY DAY (Patient taking differently: Take 10 mg by mouth daily.), Disp: 90 tablet, Rfl: 1   carvedilol (COREG) 6.25 MG tablet, TAKE 1 TABLET BY MOUTH TWICE A DAY WITH MEALS (Patient taking differently: Take 6.25 mg by mouth 2 (two) times daily with a meal.), Disp: 180 tablet, Rfl: 1   Cholecalciferol (VITAMIN D3) 2000 units capsule, Take 2,000 Units by mouth daily., Disp: , Rfl: 5   doxycycline (MONODOX) 100 MG capsule, Take 1 capsule (100 mg total) by mouth 2 (two) times daily.,  Disp: 14 capsule,  Rfl: 0   folic acid (FOLVITE) 1 MG tablet, Take 1 tablet (1 mg total) by mouth daily. (Patient taking differently: Take 1 mg by mouth 2 (two) times daily.), Disp: 30 tablet, Rfl: 11   glucose blood test strip, Use as instructed (Patient taking differently: 1 each by Other route every other day. Use as instructed), Disp: 100 each, Rfl: 12   Lancets (ACCU-CHEK MULTICLIX) lancets, Use as instructed (Patient taking differently: 1 each by Other route every other day.), Disp: 100 each, Rfl: 12   metFORMIN (GLUCOPHAGE) 500 MG tablet, TAKE 1 TABLET BY MOUTH TWICE A DAY (Patient taking differently: Take 500 mg by mouth 2 (two) times daily with a meal.), Disp: 180 tablet, Rfl: 2   Multiple Vitamin (ONE-A-DAY MENS PO), Take 1 tablet by mouth daily with breakfast., Disp: , Rfl:    OVER THE COUNTER MEDICATION, Take 2 capsules by mouth daily. Alpha beard growth, Disp: , Rfl:    OXBRYTA 500 MG TABS tablet, Take 1,500 mg by mouth daily., Disp: , Rfl:    oxyCODONE-acetaminophen (PERCOCET) 7.5-325 MG tablet, Take 1 tablet by mouth every 4 (four) hours as needed for severe pain., Disp: 20 tablet, Rfl: 0   OZEMPIC, 0.25 OR 0.5 MG/DOSE, 2 MG/1.5ML SOPN, INJECT 0.5 MG INTO THE SKIN ONCE A WEEK. (Patient taking differently: Inject 0.25 mg into the skin every Friday.), Disp: 4.5 mL, Rfl: 3   pravastatin (PRAVACHOL) 20 MG tablet, TAKE 1 TABLET BY MOUTH EVERY DAY IN THE EVENING, Disp: 90 tablet, Rfl: 0   prednisoLONE acetate (PRED FORTE) 1 % ophthalmic suspension, Place 1 drop into the right eye 4 (four) times daily for 7 days., Disp: 5 mL, Rfl: 0   triamterene-hydrochlorothiazide (MAXZIDE-25) 37.5-25 MG tablet, TAKE 1 TABLET BY MOUTH EVERY DAY, Disp: 90 tablet, Rfl: 1   valsartan (DIOVAN) 160 MG tablet, Take 1 tablet (160 mg total) by mouth daily., Disp: 30 tablet, Rfl: 11  Assessment     ICD-10-CM   1. Pre-op evaluation  Z01.818 EKG 12-Lead    2. Primary hypertension  I10     3. Type 2 diabetes  mellitus with stage 3a chronic kidney disease, without long-term current use of insulin (HCC)  E11.22    N18.31     4. Pure hypercholesterolemia  E78.00        Orders Placed This Encounter  Procedures   EKG 12-Lead    No orders of the defined types were placed in this encounter.   Medications Discontinued During This Encounter  Medication Reason   OVER THE COUNTER MEDICATION      Recommendations:   Khori Stcharles is a 47 y.o. Patient with sickle cell disease and proliferative retinopathy has not been scheduled for segmental panretinal photocoagulation under general anesthesia, referred to me on urgent basis for preoperative cardiac risk stratification.  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  1. Pre-op evaluation Patient referred by Dr. Allyne Gee for retinal surgery left eye.  Patient also diabetic, has hypertension, hypercholesterolemia, all his risk factors are well-controlled.  He also [facility] and walks briskly at anywhere from 2.5 to 4.5 months.  Without any chest pain or dyspnea.  Hence patient can be taken up for surgery with low risk.  He is not on any anticoagulants. - EKG 12-Lead  2. Primary hypertension Blood pressure is well-controlled,  3. 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  I did not make any changes to his medications.  4. Pure hypercholesterolemia Lipids in excellent control, mildly elevated triglycerides discussed with the patient, lifestyle modification and restriction of excess carbs, saturated fat discussed with the patient.  Patient has done a remarkable job with regards to maintaining healthy habits.  He will try to get down his weight 290 pounds.  As he is stable from cardiac standpoint,  EKG is normal, physical examination and vascular examination is completely normal, and he is asymptomatic, I will see him back on a as needed basis.    Yates Decamp, MD, [facility] 02/24/2023, 10:46 AM Office: [phone]

## 2023-02-24 NOTE — H&P (Signed)
Nicolas [name] is an 47 y.o. male.    Chief Complaint: retinal detachment, left eye  HPI: Pt with history of sickle cell disease ([place]) with proliferative retinopathy OU. Pt reports decreased vision OS x1-2 mos. On dilated exam, was found to have a retinal detachment of the left eye -- mostly tractional w/ ?possible rhegmatogenous component. The right eye had proliferative retinopathy with focal areas of retinal neovascularization. After a discussion of the risks benefits and alternatives to surgery, the pt has elected to proceed with surgery to repair the retinal detachment OS as well as laser photocoagulation to areas of retinal neovascularization OD under general anesthesia: OD - segmental panretinal photocoagulation via laser indirect ophthalmoscopy OS - scleral buckle + 25g PPV w/ endolaser and gas vs oil  Past Medical History:  Diagnosis Date   Arthritis    arthritis- back & L hip   Depression    situational depression, pt. out of work    Diabetes mellitus (HCC)    HTN (hypertension) 05/10/2015   Osteoarthritis of left hip 06/07/2012   Sickle cell anemia (HCC)     Past Surgical History:  Procedure Laterality Date   IR GENERIC HISTORICAL  09/26/2016   IR FLUORO GUIDE CV LINE RIGHT 09/26/2016 Irish Lack, MD [facility]   IR GENERIC HISTORICAL  09/26/2016   IR US GUIDE VASC ACCESS RIGHT 09/26/2016 Irish Lack, MD [facility]   JOINT REPLACEMENT     R hip   TOTAL HIP ARTHROPLASTY  06/07/2012   Procedure: TOTAL HIP ARTHROPLASTY;  Surgeon: Eulas Post, MD;  Location: [facility] OR;  Service: Orthopedics;  Laterality: Left;    Family History  Problem Relation Age of Onset   Cancer - Other Father    Liver cancer Father    Diabetes Mother    CAD Neg Hx    Social History:  reports that he has never smoked. He has never used smokeless tobacco. He reports that he does not currently use alcohol. He reports current drug use. Drug: Oxycodone.  Allergies: No Known Allergies  No  medications prior to admission.    Review of systems otherwise negative  There were no vitals taken for this visit.  Physical exam: Mental status: oriented x3. Eyes: See eye exam associated with this date of surgery Ears, Nose, Throat: within normal limits Neck: Within Normal limits General: within normal limits Chest: Within normal limits Breast: deferred Heart: Within normal limits Abdomen: Within normal limits GU: deferred Extremities: within normal limits Skin: within normal limits  Assessment/Plan 1. Sickle cell disease with proliferative retinopathy OU and retinal detachment OS Plan: To [facility] for surgery OS and laser OD under general anesthesia OD - segmental panretinal photocoagulation via laser indirect ophthalmoscopy OS - scleral buckle + 25g PPV w/ endolaser and gas vs oil Case scheduled for Thursday, 05.30.24, 1130 am -- [facility] OR 08   Karie Chimera, M.D., Ph.D. Vitreoretinal Surgeon [facility]

## 2023-02-25 ENCOUNTER — Encounter (HOSPITAL_COMMUNITY)
Admission: RE | Admit: 2023-02-25 | Discharge: 2023-02-25 | Disposition: A | Discharge status: Home / Self Care | Payer: 59 | Source: Ambulatory Visit | Attending: Ophthalmology | Admitting: Ophthalmology

## 2023-02-25 ENCOUNTER — Encounter (HOSPITAL_COMMUNITY): Payer: Self-pay

## 2023-02-25 ENCOUNTER — Other Ambulatory Visit: Payer: Self-pay

## 2023-02-25 DIAGNOSIS — Z01812 Encounter for preprocedural laboratory examination: Secondary | ICD-10-CM | POA: Insufficient documentation

## 2023-02-25 DIAGNOSIS — D5720 Sickle-cell/Hb-C disease without crisis: Secondary | ICD-10-CM | POA: Diagnosis not present

## 2023-02-25 DIAGNOSIS — Z01818 Encounter for other preprocedural examination: Secondary | ICD-10-CM

## 2023-02-25 LAB — CBC
HCT: 33.6 % — ABNORMAL LOW (ref 39.0–52.0)
Hemoglobin: 12.2 g/dL — ABNORMAL LOW (ref 13.0–17.0)
MCH: 31 pg (ref 26.0–34.0)
MCHC: 36.3 g/dL — ABNORMAL HIGH (ref 30.0–36.0)
MCV: 85.5 fL (ref 80.0–100.0)
Platelets: 367 10*3/uL (ref 150–400)
RBC: 3.93 MIL/uL — ABNORMAL LOW (ref 4.22–5.81)
RDW: 14.2 % (ref 11.5–15.5)
WBC: 9.7 10*3/uL (ref 4.0–10.5)
nRBC: 0.7 % — ABNORMAL HIGH (ref 0.0–0.2)

## 2023-02-25 LAB — GLUCOSE, CAPILLARY: Glucose-Capillary: 152 mg/dL — ABNORMAL HIGH (ref 70–99)

## 2023-02-25 NOTE — Anesthesia Preprocedure Evaluation (Addendum)
Anesthesia Evaluation  Patient identified by MRN, date of birth, ID band Patient awake    Reviewed: Allergy & Precautions, NPO status , Patient's Chart, lab work & pertinent test results  History of Anesthesia Complications Negative for: history of anesthetic complications  Airway Mallampati: III  TM Distance: >3 FB Neck ROM: Full   Comment: Previous grade III view with Miller 2, difficult mask requiring OPA and 2-persons (BMI was 49.7) Dental  (+) Dental Advisory Given   Pulmonary neg pulmonary ROS   Pulmonary exam normal breath sounds clear to auscultation       Cardiovascular hypertension (amlodipine, carvedilol, valsartan, triamterene-HCTZ), Pt. on medications (-) angina (-) Past MI, (-) Cardiac Stents and (-) CABG (-) dysrhythmias  Rhythm:Regular Rate:Normal  HLD  TTE 09/23/2016: Study Conclusions   - Left ventricle: The cavity size was normal. There was mild    concentric hypertrophy. Systolic function was normal. The    estimated ejection fraction was in the range of 55% to 60%. Wall    motion was normal; there were no regional wall motion    abnormalities. Left ventricular diastolic function parameters    were normal.  - Aortic valve: Transvalvular velocity was within the normal range.    There was no stenosis. There was trivial regurgitation.  - Mitral valve: Transvalvular velocity was within the normal range.    There was no evidence for stenosis. There was trivial    regurgitation.  - Right ventricle: The cavity size was normal. Wall thickness was    normal. Systolic function was normal.  - Atrial septum: No defect or patent foramen ovale was identified    by color flow Doppler.  - Tricuspid valve: There was no regurgitation.  - Pulmonary arteries: Systolic pressure was within the normal    range. PA peak pressure: 31 mm Hg (S).  - Pericardium, extracardiac: A trivial pericardial effusion was    identified.      Neuro/Psych  PSYCHIATRIC DISORDERS  Depression    negative neurological ROS     GI/Hepatic negative GI ROS, Neg liver ROS,,,  Endo/Other  diabetes, Type 2, Oral Hypoglycemic Agents    Renal/GU CRFRenal disease     Musculoskeletal  (+) Arthritis , Osteoarthritis,    Abdominal  (+) + obese  Peds  Hematology  (+) Blood dyscrasia (last crisis 09/2022), Sickle cell anemia   Anesthesia Other Findings Retinal detachment of left eye  Last Ozempic: 02/14/2023  Patient has cardiac clearance. He has missed his last two appointments with Hematology. Per telephone note in Care Everywhere on 02/24/2023, they are aware he is having surgery. Hgb 12.2. Denies any signs of sickle cell crisis.  Reproductive/Obstetrics                             Anesthesia Physical Anesthesia Plan  ASA: 3  Anesthesia Plan: General   Post-op Pain Management: Tylenol PO (pre-op)*   Induction: Intravenous  PONV Risk Score and Plan: 2 and Ondansetron, Dexamethasone and Treatment may vary due to age or medical condition  Airway Management Planned: Oral ETT and Video Laryngoscope Planned  Additional Equipment:   Intra-op Plan:   Post-operative Plan: Extubation in OR  Informed Consent: I have reviewed the patients History and Physical, chart, labs and discussed the procedure including the risks, benefits and alternatives for the proposed anesthesia with the patient or authorized representative who has indicated his/her understanding and acceptance.     Dental advisory given  Plan Discussed with: CRNA and Anesthesiologist  Anesthesia Plan Comments: (Risks of general anesthesia discussed including, but not limited to, sore throat, hoarse voice, chipped/damaged teeth, injury to vocal cords, nausea and vomiting, allergic reactions, lung infection, heart attack, stroke, and death. All questions answered. )       Anesthesia Quick Evaluation

## 2023-02-25 NOTE — Progress Notes (Signed)
PCP -Dorothyann Peng  Cardiologist - Yates Decamp  PPM/ICD - Denies  Chest x-ray - N/I EKG - 02/24/23 Stress Test - Denies ECHO - 09/23/16 Cardiac Cath - Denies  Sleep Study - Deneis  DM - Type II CBG at PAT appt 152 Fasting Blood Sugar - 95-160 Checks Blood Sugar weekly  Last dose of GLP1 agonist-  Ozempic GLP1 instructions: On hold last dose 18th  Blood Thinner Instructions:N/A Aspirin Instructions:N/A  COVID TEST- N/A   Anesthesia review: Yes Office has requested  Patient denies shortness of breath, fever, cough and chest pain at PAT appointment   All instructions explained to the patient, with a verbal understanding of the material. Patient agrees to go over the instructions while at home for a better understanding.  The opportunity to ask questions was provided.

## 2023-02-26 ENCOUNTER — Ambulatory Visit (HOSPITAL_COMMUNITY)
Admission: RE | Admit: 2023-02-26 | Discharge: 2023-02-26 | Disposition: A | Discharge status: Home / Self Care | Payer: 59 | Attending: Ophthalmology | Admitting: Ophthalmology

## 2023-02-26 ENCOUNTER — Encounter (HOSPITAL_COMMUNITY)
Admission: RE | Disposition: A | Discharge status: Home / Self Care | Payer: Self-pay | Source: Home / Self Care | Attending: Ophthalmology

## 2023-02-26 ENCOUNTER — Ambulatory Visit (HOSPITAL_COMMUNITY): Payer: 59 | Admitting: Physician Assistant

## 2023-02-26 ENCOUNTER — Other Ambulatory Visit: Payer: Self-pay

## 2023-02-26 ENCOUNTER — Ambulatory Visit (HOSPITAL_BASED_OUTPATIENT_CLINIC_OR_DEPARTMENT_OTHER): Payer: 59 | Admitting: Physician Assistant

## 2023-02-26 ENCOUNTER — Encounter (HOSPITAL_COMMUNITY): Payer: Self-pay | Admitting: Ophthalmology

## 2023-02-26 DIAGNOSIS — D5720 Sickle-cell/Hb-C disease without crisis: Secondary | ICD-10-CM | POA: Insufficient documentation

## 2023-02-26 DIAGNOSIS — M1990 Unspecified osteoarthritis, unspecified site: Secondary | ICD-10-CM | POA: Insufficient documentation

## 2023-02-26 DIAGNOSIS — E785 Hyperlipidemia, unspecified: Secondary | ICD-10-CM

## 2023-02-26 DIAGNOSIS — I129 Hypertensive chronic kidney disease with stage 1 through stage 4 chronic kidney disease, or unspecified chronic kidney disease: Secondary | ICD-10-CM | POA: Diagnosis not present

## 2023-02-26 DIAGNOSIS — D571 Sickle-cell disease without crisis: Secondary | ICD-10-CM | POA: Diagnosis not present

## 2023-02-26 DIAGNOSIS — Z79899 Other long term (current) drug therapy: Secondary | ICD-10-CM | POA: Diagnosis not present

## 2023-02-26 DIAGNOSIS — Z01818 Encounter for other preprocedural examination: Secondary | ICD-10-CM

## 2023-02-26 DIAGNOSIS — Z833 Family history of diabetes mellitus: Secondary | ICD-10-CM | POA: Diagnosis not present

## 2023-02-26 DIAGNOSIS — I10 Essential (primary) hypertension: Secondary | ICD-10-CM | POA: Diagnosis not present

## 2023-02-26 DIAGNOSIS — H36821 Proliferative sickle-cell retinopathy, right eye: Secondary | ICD-10-CM | POA: Diagnosis not present

## 2023-02-26 DIAGNOSIS — N189 Chronic kidney disease, unspecified: Secondary | ICD-10-CM | POA: Diagnosis not present

## 2023-02-26 DIAGNOSIS — E113593 Type 2 diabetes mellitus with proliferative diabetic retinopathy without macular edema, bilateral: Secondary | ICD-10-CM | POA: Insufficient documentation

## 2023-02-26 DIAGNOSIS — H33002 Unspecified retinal detachment with retinal break, left eye: Secondary | ICD-10-CM | POA: Insufficient documentation

## 2023-02-26 DIAGNOSIS — H3322 Serous retinal detachment, left eye: Secondary | ICD-10-CM | POA: Diagnosis not present

## 2023-02-26 DIAGNOSIS — D631 Anemia in chronic kidney disease: Secondary | ICD-10-CM | POA: Diagnosis not present

## 2023-02-26 DIAGNOSIS — H3342 Traction detachment of retina, left eye: Secondary | ICD-10-CM | POA: Diagnosis not present

## 2023-02-26 DIAGNOSIS — E1122 Type 2 diabetes mellitus with diabetic chronic kidney disease: Secondary | ICD-10-CM | POA: Diagnosis not present

## 2023-02-26 HISTORY — PX: PHOTOCOAGULATION WITH LASER: SHX6027

## 2023-02-26 HISTORY — PX: VITRECTOMY 25 GAUGE WITH SCLERAL BUCKLE: SHX6183

## 2023-02-26 LAB — GLUCOSE, CAPILLARY
Glucose-Capillary: 100 mg/dL — ABNORMAL HIGH (ref 70–99)
Glucose-Capillary: 121 mg/dL — ABNORMAL HIGH (ref 70–99)

## 2023-02-26 SURGERY — VITRECTOMY, USING 25-GAUGE INSTRUMENTS, WITH SCLERAL BUCKLING
Anesthesia: General | Site: Eye | Laterality: Right

## 2023-02-26 MED ORDER — LABETALOL HCL 5 MG/ML IV SOLN
INTRAVENOUS | Status: DC | PRN
  Administered 2023-02-26 (×2): 5 mg via INTRAVENOUS

## 2023-02-26 MED ORDER — PROPOFOL 10 MG/ML IV BOLUS
INTRAVENOUS | Status: DC | PRN
  Administered 2023-02-26: 200 mg via INTRAVENOUS

## 2023-02-26 MED ORDER — FENTANYL CITRATE (PF) 250 MCG/5ML IJ SOLN
INTRAMUSCULAR | Status: AC
  Filled 2023-02-26: qty 5

## 2023-02-26 MED ORDER — NA CHONDROIT SULF-NA HYALURON 40-30 MG/ML IO SOSY
INTRAOCULAR | Status: AC
  Filled 2023-02-26: qty 1

## 2023-02-26 MED ORDER — FENTANYL CITRATE (PF) 100 MCG/2ML IJ SOLN: 25.0000 ug | INTRAMUSCULAR | Status: DC | PRN

## 2023-02-26 MED ORDER — ACETAZOLAMIDE SODIUM 500 MG IJ SOLR
INTRAMUSCULAR | Status: AC
  Filled 2023-02-26: qty 500

## 2023-02-26 MED ORDER — LIDOCAINE HCL (PF) 1 % IJ SOLN
INTRAMUSCULAR | Status: AC
  Filled 2023-02-26: qty 30

## 2023-02-26 MED ORDER — INDOCYANINE GREEN 25 MG IV SOLR
INTRAVENOUS | Status: AC
  Filled 2023-02-26: qty 10

## 2023-02-26 MED ORDER — CHLORHEXIDINE GLUCONATE 0.12 % MT SOLN
15.0000 mL | Freq: Once | OROMUCOSAL | Status: AC
  Administered 2023-02-26: 15 mL via OROMUCOSAL
  Filled 2023-02-26: qty 15

## 2023-02-26 MED ORDER — PREDNISOLONE ACETATE 1 % OP SUSP
OPHTHALMIC | Status: DC | PRN
  Administered 2023-02-26: 1 [drp] via OPHTHALMIC

## 2023-02-26 MED ORDER — STERILE WATER FOR INJECTION IJ SOLN
INTRAMUSCULAR | Status: AC
  Filled 2023-02-26: qty 10

## 2023-02-26 MED ORDER — SUGAMMADEX SODIUM 200 MG/2ML IV SOLN
INTRAVENOUS | Status: DC | PRN
  Administered 2023-02-26: 200 mg via INTRAVENOUS

## 2023-02-26 MED ORDER — BSS IO SOLN
INTRAOCULAR | Status: DC | PRN
  Administered 2023-02-26 (×2): 15 mL

## 2023-02-26 MED ORDER — DEXAMETHASONE SODIUM PHOSPHATE 10 MG/ML IJ SOLN
INTRAMUSCULAR | Status: DC | PRN
  Administered 2023-02-26: 4 mg via INTRAVENOUS

## 2023-02-26 MED ORDER — DORZOLAMIDE HCL-TIMOLOL MAL 2-0.5 % OP SOLN
OPHTHALMIC | Status: AC
  Filled 2023-02-26: qty 10

## 2023-02-26 MED ORDER — CEFTAZIDIME 1 G IJ SOLR
INTRAMUSCULAR | Status: AC
  Filled 2023-02-26: qty 1

## 2023-02-26 MED ORDER — EPINEPHRINE PF 1 MG/ML IJ SOLN
INTRAMUSCULAR | Status: AC
  Filled 2023-02-26: qty 1

## 2023-02-26 MED ORDER — SODIUM CHLORIDE 0.9 % IV SOLN: INTRAVENOUS | Status: DC

## 2023-02-26 MED ORDER — BSS IO SOLN
INTRAOCULAR | Status: AC
  Filled 2023-02-26: qty 15

## 2023-02-26 MED ORDER — ACETAMINOPHEN 10 MG/ML IV SOLN
1000.0000 mg | Freq: Once | INTRAVENOUS | Status: DC | PRN
  Administered 2023-02-26: 1000 mg via INTRAVENOUS

## 2023-02-26 MED ORDER — TRIAMCINOLONE ACETONIDE 40 MG/ML IJ SUSP
INTRAMUSCULAR | Status: DC | PRN
  Administered 2023-02-26: 40 mg

## 2023-02-26 MED ORDER — BRIMONIDINE TARTRATE 0.2 % OP SOLN
OPHTHALMIC | Status: DC | PRN
  Administered 2023-02-26: 1 [drp] via OPHTHALMIC

## 2023-02-26 MED ORDER — LIDOCAINE 2% (20 MG/ML) 5 ML SYRINGE
INTRAMUSCULAR | Status: DC | PRN
  Administered 2023-02-26: 100 mg via INTRAVENOUS

## 2023-02-26 MED ORDER — POLYMYXIN B SULFATE 500000 UNITS IJ SOLR
INTRAMUSCULAR | Status: AC
  Filled 2023-02-26: qty 10

## 2023-02-26 MED ORDER — ATROPINE SULFATE 1 % OP SOLN
OPHTHALMIC | Status: DC | PRN
  Administered 2023-02-26: 1 [drp] via OPHTHALMIC

## 2023-02-26 MED ORDER — NA CHONDROIT SULF-NA HYALURON 40-30 MG/ML IO SOSY
INTRAOCULAR | Status: DC | PRN
  Administered 2023-02-26: .5 mL via INTRAOCULAR

## 2023-02-26 MED ORDER — PREDNISOLONE ACETATE 1 % OP SUSP
OPHTHALMIC | Status: AC
  Filled 2023-02-26: qty 5

## 2023-02-26 MED ORDER — ACETAMINOPHEN 500 MG PO TABS
1000.0000 mg | ORAL_TABLET | Freq: Once | ORAL | Status: AC
  Administered 2023-02-26: 1000 mg via ORAL
  Filled 2023-02-26: qty 2

## 2023-02-26 MED ORDER — LABETALOL HCL 5 MG/ML IV SOLN
INTRAVENOUS | Status: AC
  Filled 2023-02-26: qty 4

## 2023-02-26 MED ORDER — DORZOLAMIDE HCL-TIMOLOL MAL 2-0.5 % OP SOLN
OPHTHALMIC | Status: DC | PRN
  Administered 2023-02-26: 1 [drp] via OPHTHALMIC

## 2023-02-26 MED ORDER — BUPIVACAINE HCL (PF) 0.75 % IJ SOLN
INTRAMUSCULAR | Status: DC | PRN
  Administered 2023-02-26: 5 mL

## 2023-02-26 MED ORDER — PHENYLEPHRINE HCL 10 % OP SOLN
1.0000 [drp] | OPHTHALMIC | Status: AC | PRN
  Administered 2023-02-26 (×3): 1 [drp] via OPHTHALMIC
  Filled 2023-02-26: qty 5

## 2023-02-26 MED ORDER — MIDAZOLAM HCL 5 MG/5ML IJ SOLN
INTRAMUSCULAR | Status: DC | PRN
  Administered 2023-02-26: 2 mg via INTRAVENOUS

## 2023-02-26 MED ORDER — PHENYLEPHRINE 80 MCG/ML (10ML) SYRINGE FOR IV PUSH (FOR BLOOD PRESSURE SUPPORT)
PREFILLED_SYRINGE | INTRAVENOUS | Status: DC | PRN
  Administered 2023-02-26: 80 ug via INTRAVENOUS
  Administered 2023-02-26: 160 ug via INTRAVENOUS
  Administered 2023-02-26: 80 ug via INTRAVENOUS

## 2023-02-26 MED ORDER — OXYCODONE HCL 5 MG/5ML PO SOLN: 5.0000 mg | Freq: Once | ORAL | Status: AC | PRN

## 2023-02-26 MED ORDER — FENTANYL CITRATE (PF) 100 MCG/2ML IJ SOLN
INTRAMUSCULAR | Status: AC
  Filled 2023-02-26: qty 2

## 2023-02-26 MED ORDER — ACETAMINOPHEN 10 MG/ML IV SOLN
INTRAVENOUS | Status: AC
  Filled 2023-02-26: qty 100

## 2023-02-26 MED ORDER — SODIUM CHLORIDE (PF) 0.9 % IJ SOLN
INTRAMUSCULAR | Status: AC
  Filled 2023-02-26: qty 10

## 2023-02-26 MED ORDER — PROPARACAINE HCL 0.5 % OP SOLN
1.0000 [drp] | OPHTHALMIC | Status: AC | PRN
  Administered 2023-02-26 (×3): 1 [drp] via OPHTHALMIC
  Filled 2023-02-26: qty 15

## 2023-02-26 MED ORDER — LIDOCAINE HCL (PF) 4 % IJ SOLN
INTRAMUSCULAR | Status: AC
  Filled 2023-02-26: qty 5

## 2023-02-26 MED ORDER — EPINEPHRINE PF 1 MG/ML IJ SOLN
INTRAOCULAR | Status: DC | PRN
  Administered 2023-02-26: 500 mL

## 2023-02-26 MED ORDER — HYDROCODONE-ACETAMINOPHEN 5-325 MG PO TABS: 1.0000 | ORAL_TABLET | ORAL | 0 refills | Status: DC | PRN

## 2023-02-26 MED ORDER — LIDOCAINE HCL (PF) 2 % IJ SOLN
INTRAMUSCULAR | Status: AC
  Filled 2023-02-26: qty 5

## 2023-02-26 MED ORDER — OXYCODONE HCL 5 MG PO TABS
5.0000 mg | ORAL_TABLET | Freq: Once | ORAL | Status: AC | PRN
  Administered 2023-02-26: 5 mg via ORAL

## 2023-02-26 MED ORDER — GATIFLOXACIN 0.5 % OP SOLN
OPHTHALMIC | Status: AC
  Filled 2023-02-26: qty 2.5

## 2023-02-26 MED ORDER — DEXAMETHASONE SODIUM PHOSPHATE 10 MG/ML IJ SOLN
INTRAMUSCULAR | Status: AC
  Filled 2023-02-26: qty 1

## 2023-02-26 MED ORDER — ATROPINE SULFATE 1 % OP SOLN
1.0000 [drp] | OPHTHALMIC | Status: AC | PRN
  Administered 2023-02-26 (×3): 1 [drp] via OPHTHALMIC
  Filled 2023-02-26: qty 2

## 2023-02-26 MED ORDER — OXYCODONE HCL 5 MG PO TABS
ORAL_TABLET | ORAL | Status: AC
  Filled 2023-02-26: qty 1

## 2023-02-26 MED ORDER — PROPOFOL 10 MG/ML IV BOLUS
INTRAVENOUS | Status: AC
  Filled 2023-02-26: qty 20

## 2023-02-26 MED ORDER — AMISULPRIDE (ANTIEMETIC) 5 MG/2ML IV SOLN: 10.0000 mg | Freq: Once | INTRAVENOUS | Status: DC | PRN

## 2023-02-26 MED ORDER — TOBRAMYCIN-DEXAMETHASONE 0.3-0.1 % OP OINT
TOPICAL_OINTMENT | OPHTHALMIC | Status: AC
  Filled 2023-02-26: qty 3.5

## 2023-02-26 MED ORDER — STERILE WATER FOR INJECTION IJ SOLN
INTRAMUSCULAR | Status: DC | PRN
  Administered 2023-02-26: 20 mL

## 2023-02-26 MED ORDER — TRIAMCINOLONE ACETONIDE 40 MG/ML IJ SUSP
INTRAMUSCULAR | Status: AC
  Filled 2023-02-26: qty 5

## 2023-02-26 MED ORDER — ATROPINE SULFATE 1 % OP SOLN
OPHTHALMIC | Status: AC
  Filled 2023-02-26: qty 5

## 2023-02-26 MED ORDER — CARBACHOL 0.01 % IO SOLN
INTRAOCULAR | Status: AC
  Filled 2023-02-26: qty 1.5

## 2023-02-26 MED ORDER — BUPIVACAINE HCL (PF) 0.75 % IJ SOLN
INTRAMUSCULAR | Status: AC
  Filled 2023-02-26: qty 10

## 2023-02-26 MED ORDER — MIDAZOLAM HCL 2 MG/2ML IJ SOLN
INTRAMUSCULAR | Status: AC
  Filled 2023-02-26: qty 2

## 2023-02-26 MED ORDER — ROCURONIUM BROMIDE 10 MG/ML (PF) SYRINGE
PREFILLED_SYRINGE | INTRAVENOUS | Status: DC | PRN
  Administered 2023-02-26 (×2): 20 mg via INTRAVENOUS
  Administered 2023-02-26: 50 mg via INTRAVENOUS
  Administered 2023-02-26: 40 mg via INTRAVENOUS
  Administered 2023-02-26: 50 mg via INTRAVENOUS
  Administered 2023-02-26: 20 mg via INTRAVENOUS

## 2023-02-26 MED ORDER — BACITRACIN-POLYMYXIN B 500-10000 UNIT/GM OP OINT
TOPICAL_OINTMENT | OPHTHALMIC | Status: DC | PRN
  Administered 2023-02-26: 1 via OPHTHALMIC

## 2023-02-26 MED ORDER — FENTANYL CITRATE (PF) 100 MCG/2ML IJ SOLN
INTRAMUSCULAR | Status: DC | PRN
  Administered 2023-02-26 (×2): 25 ug via INTRAVENOUS
  Administered 2023-02-26: 100 ug via INTRAVENOUS

## 2023-02-26 MED ORDER — GATIFLOXACIN 0.5 % OP SOLN
OPHTHALMIC | Status: DC | PRN
  Administered 2023-02-26: 1 [drp] via OPHTHALMIC

## 2023-02-26 MED ORDER — LIDOCAINE HCL (PF) 1 % IJ SOLN
INTRAMUSCULAR | Status: AC
  Filled 2023-02-26: qty 5

## 2023-02-26 MED ORDER — TROPICAMIDE 1 % OP SOLN
1.0000 [drp] | OPHTHALMIC | Status: AC | PRN
  Administered 2023-02-26 (×3): 1 [drp] via OPHTHALMIC
  Filled 2023-02-26: qty 15

## 2023-02-26 MED ORDER — BRIMONIDINE TARTRATE 0.2 % OP SOLN
OPHTHALMIC | Status: AC
  Filled 2023-02-26: qty 5

## 2023-02-26 MED ORDER — BACITRACIN-POLYMYXIN B 500-10000 UNIT/GM OP OINT
TOPICAL_OINTMENT | OPHTHALMIC | Status: AC
  Filled 2023-02-26: qty 3.5

## 2023-02-26 MED ORDER — BSS PLUS IO SOLN
INTRAOCULAR | Status: AC
  Filled 2023-02-26: qty 500

## 2023-02-26 MED ORDER — INSULIN ASPART 100 UNIT/ML IJ SOLN: 0.0000 [IU] | INTRAMUSCULAR | Status: DC | PRN

## 2023-02-26 MED ORDER — LIDOCAINE HCL (PF) 1 % IJ SOLN
INTRAMUSCULAR | Status: DC | PRN
  Administered 2023-02-26: 5 mL

## 2023-02-26 MED ORDER — DEXTROSE 5 % IV SOLN
INTRAVENOUS | Status: AC
  Filled 2023-02-26: qty 100

## 2023-02-26 MED ORDER — PHENYLEPHRINE HCL-NACL 20-0.9 MG/250ML-% IV SOLN
INTRAVENOUS | Status: DC | PRN
  Administered 2023-02-26: 50 ug/min via INTRAVENOUS

## 2023-02-26 MED ORDER — ORAL CARE MOUTH RINSE: 15.0000 mL | Freq: Once | OROMUCOSAL | Status: AC

## 2023-02-26 MED ORDER — ONDANSETRON HCL 4 MG/2ML IJ SOLN
INTRAMUSCULAR | Status: DC | PRN
  Administered 2023-02-26: 4 mg via INTRAVENOUS

## 2023-02-26 SURGICAL SUPPLY — 57 items
APL SWBSTK 6 STRL LF DISP (MISCELLANEOUS) ×8
APPLICATOR COTTON TIP 6 STRL (MISCELLANEOUS) ×8 IMPLANT
APPLICATOR COTTON TIP 6IN STRL (MISCELLANEOUS) ×8
BAND SCLERAL BUCKLING TYPE 41 (Ophthalmic Related) IMPLANT
BAND WRIST GAS GREEN (MISCELLANEOUS) IMPLANT
BETADINE 5% OPHTHALMIC (OPHTHALMIC) ×4 IMPLANT
BNDG EYE OVAL 2 1/8 X 2 5/8 (GAUZE/BANDAGES/DRESSINGS) IMPLANT
CABLE BIPOLOR RESECTION CORD (MISCELLANEOUS) IMPLANT
CANNULA DUALBORE 25G (CANNULA) ×2 IMPLANT
CANNULA FLEX TIP 25G (CANNULA) ×2 IMPLANT
COVER SURGICAL LIGHT HANDLE (MISCELLANEOUS) ×2 IMPLANT
DRAPE MICROSCOPE LEICA 46X105 (MISCELLANEOUS) ×2 IMPLANT
FILTER BLUE MILLIPORE (MISCELLANEOUS) IMPLANT
FILTER STRAW FLUID ASPIR (MISCELLANEOUS) IMPLANT
FORCEPS GRIESHABER ILM 25G A (INSTRUMENTS) IMPLANT
GAS AUTO FILL CONSTEL (OPHTHALMIC) ×2
GAS AUTO FILL CONSTELLATION (OPHTHALMIC) IMPLANT
GAS WRIST BAND GREEN (MISCELLANEOUS)
GLOVE BIO SURGEON STRL SZ7.5 (GLOVE) ×4 IMPLANT
GLOVE BIOGEL M 7.0 STRL (GLOVE) ×2 IMPLANT
GOWN STRL REUS W/ TWL LRG LVL3 (GOWN DISPOSABLE) ×4 IMPLANT
GOWN STRL REUS W/ TWL XL LVL3 (GOWN DISPOSABLE) ×2 IMPLANT
GOWN STRL REUS W/TWL LRG LVL3 (GOWN DISPOSABLE) ×6
GOWN STRL REUS W/TWL XL LVL3 (GOWN DISPOSABLE) ×2
KIT BASIN OR (CUSTOM PROCEDURE TRAY) ×2 IMPLANT
KIT PERFLUORON PROCEDURE 5ML (MISCELLANEOUS) IMPLANT
LENS VITRECTOMY FLAT OCLR DISP (MISCELLANEOUS) IMPLANT
NDL 18GX1X1/2 (RX/OR ONLY) (NEEDLE) ×4 IMPLANT
NDL 25GX 5/8IN NON SAFETY (NEEDLE) ×2 IMPLANT
NDL FILTER BLUNT 18X1 1/2 (NEEDLE) ×2 IMPLANT
NDL HYPO 30X.5 LL (NEEDLE) IMPLANT
NEEDLE 18GX1X1/2 (RX/OR ONLY) (NEEDLE) ×4 IMPLANT
NEEDLE 25GX 5/8IN NON SAFETY (NEEDLE) ×2 IMPLANT
NEEDLE FILTER BLUNT 18X1 1/2 (NEEDLE) ×2 IMPLANT
NEEDLE HYPO 30X.5 LL (NEEDLE) IMPLANT
NS IRRIG 1000ML POUR BTL (IV SOLUTION) ×2 IMPLANT
OPHTHALMIC BETADINE 5% (OPHTHALMIC) ×4
PACK VITRECTOMY CUSTOM (CUSTOM PROCEDURE TRAY) ×2 IMPLANT
PAD ARMBOARD 7.5X6 YLW CONV (MISCELLANEOUS) ×4 IMPLANT
PAK PIK VITRECTOMY CVS 25GA (OPHTHALMIC) ×2 IMPLANT
PIC ILLUMINATED 25G (OPHTHALMIC) ×2
PIK ILLUMINATED 25G (OPHTHALMIC) IMPLANT
PROBE ENDO DIATHERMY 25G (MISCELLANEOUS) IMPLANT
PROBE LASER ILLUM FLEX CVD 25G (OPHTHALMIC) IMPLANT
SLEEVE SCLERAL BUCK TYPE 70 (Ophthalmic Related) ×2 IMPLANT
SUT ETHILON 5.0 S-24 (SUTURE) ×2 IMPLANT
SUT ETHILON 9 0 TG140 8 (SUTURE) IMPLANT
SUT SILK 2 0 (SUTURE) ×2
SUT SILK 2 0 TIES 17X18 (SUTURE)
SUT SILK 2-0 18XBRD TIE 12 (SUTURE) ×2 IMPLANT
SUT SILK 2-0 18XBRD TIE BLK (SUTURE) ×2 IMPLANT
SUT VICRYL 7 0 TG140 8 (SUTURE) ×2 IMPLANT
SYR 10ML LL (SYRINGE) ×4 IMPLANT
SYR TB 1ML LUER SLIP (SYRINGE) ×2 IMPLANT
TOWEL GREEN STERILE FF (TOWEL DISPOSABLE) ×2 IMPLANT
TRAY FOLEY W/BAG SLVR 14FR (SET/KITS/TRAYS/PACK) IMPLANT
WATER STERILE IRR 1000ML POUR (IV SOLUTION) ×2 IMPLANT

## 2023-02-26 NOTE — Interval H&P Note (Signed)
History and Physical Interval Note:  02/26/2023 11:16 AM  Nicolas [name]  has presented today for surgery, with the diagnosis of retinal detachment of left eye.  The various methods of treatment have been discussed with the patient and family. After consideration of risks, benefits and other options for treatment, the patient has consented to  Procedure(s): VITRECTOMY 25 GAUGE WITH SCLERAL BUCKLE (Left) PHOTOCOAGULATION WITH LASER (Right) as a surgical intervention.  The patient's history has been reviewed, patient examined, no change in status, stable for surgery.  I have reviewed the patient's chart and labs.  Questions were answered to the patient's satisfaction.     [name]

## 2023-02-26 NOTE — Op Note (Signed)
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 Exam under anesthesia, RIGHT EYE 2. Fill-in panretinal photocoagulation via indirect laser ophthalmoscopy, RIGHT EYE  LEFT EYE 1. Scleral buckle procedure, LEFT EYE 2. 25 gauge pars plana vitrectomy, LEFT EYE 3. Preretinal membrane peel, LEFT EYE 4. Perfluoron injection, LEFT EYE 5. Endolaser, LEFT EYE 6. Fluid air exchange, LEFT EYE 7. Injection of 14% C3F8 gas, LEFT EYE     Indications for procedure: This is a 47 yo AAM with a history of sickle cell disease with proliferative retinopathy in both eyes. He presented with a 1-2 month history of decreased vision in the LEFT EYE, and on examination was found to have a combined tractional and rhegmatogenous retinal detachment in the left eye. After discussing the risks, benefits, and alternatives to surgery, the patient electively decided to proceed with surgery and informed consent was obtained. The surgery was an attempt to remove all preretinal/tractional membranes from the left eye and potentially improve the vision within the reasonable expectations of the surgeon. The goal of the procedures in the right eye was to fill-in and supplement the panretinal photocoagulation to further stabilize the eye and prevent any further vision loss from sickle cell retinopathy.   Procedure in Detail:  The patient was met in the pre-operative holding area where their identification data was verified. It was noted that there was a signed, informed consent in the chart and both eyes were verbally verified by the patient--right eye for laser and left eye for surgery. Then the operative left eye and was marked with the  surgeon's initials with a marking pen and the right eye was marked with word "laser." The patient was then taken to the operating room and placed in the supine position. General endotracheal anesthesia was induced. Attention was first turned to the indirect laser ophthalmoscopy portion of the procedure. Fill in scatter panretinal photocoagulation was performed around focal areas of peripheral neovascularization at 0300 and 0730 oclock using the laser indirect ophthalmoscope with scleral depression. 842 spots were placed at 200-250 mW power, 50 ms duration. Following completion of laser PRP, 1 drop of prednisolone acetate was applied to the right eye and the eyelid was patched and taped closed. Next, attention was then turned to surgical portion of the procedure in the LEFT EYE. A secondary time-out was performed to identify the correct patient, eyes, procedures, and any allergies. The left eye was prepped and draped in the usual sterile ophthalmic fashion followed by placement of a lid speculum.             A 360 conjunctival peritomy was created using Westcott scissors and 0.12 forceps. Each of the four quadrants between the rectus muscles was dissected using Stevens scissors to detach Tenon's attachments from the globe. Each of the four rectus muscles was isolated on a muscle hook and slung using 2-0 Silk suture in the usual standard fashion. Each of the four quadrants between the rectus muscles was inspected and there were noted to be no areas of scleral thinning. A #41 silicone band was then brought onto the field and was threaded under each rectus muscle. The band was then loosely secured using a #70 Watzke sleeve in the inferonasal quadrant. The  band was then sutured to the sclera in each quadrant using 5-0 nylon sutures passed partial thickness through the sclera in a horizontal mattress fashion. The scleral buckle was then tightened to the appropriate height with two locking needle drivers. Attention was  then turned to the vitrectomy portion of the procedure.              A 25 gauge trocar was placed in the inferotemporal quadrant in a beveled fashion. A 4 mm infusion cannula was placed through this trocar, and the infusion cannula was confirmed in the vitreous cavity with no incarceration of retina or choroid prior to turning it on. Two additional 25 gauge trocars were placed in the superonasal and superotemporal quadrants (2 and 10 oclock, respectively) in a similar beveled fashion. At this time, a standard three-port pars plana vitrectomy was performed using the light pipe, the cutter, and the BIOM viewing system. A thorough anterior, core and peripheral vitreous dissection was performed. A posterior vitreous detachment was induced using ILM and MaxGrip forceps and suction from the vitrectomy probe. The posterior / cortical vitreous was notably adherent to the retina. Inspection of the posterior pole revealed  a total retinal detachment involving the macula and fovea with scattered fibrosis and traction greatest in the nasal midzone There were 4 retinal tears in the inferonasal quadrant -- two larger tears at 0730 and two smaller tears at 0800 and 0900 oclock.             Traction was carefully removed from all retinal breaks and areas of neovascularization using ILM and MaxGrip forceps and the vitrectomy probe. The retinal breaks were trimmed using the cutter to smooth the edges and marked with diathermy. Perfluoron was injected to push the subretinal fluid anterior to the scleral buckle. Under perfluoron, endolaser was applied around the retinal breaks, over the posterior edge of the scleral buckle and just posterior to the buckle. Then, a complete fluid-air exchange was performed with a soft tip extrusion cannula over the breaks, then posteriorly to remove the perfluoron. After completion of these maneuvers, the retina was flat over the macula and over the scleral buckle. Under air, endolaser was applied to  all the breaks and over and anterior to the scleral buckle to the ora.             At this time, the buckle height was confirmed and the buckle was finalized by trimming the band ends. The superotemporal trocar was removed and sutured with 7-0 vicryl in an interrupted fashion.  A complete air to 14% C3F8 gas exchange was performed through the infusion cannula and vented through the superonasal trocar using the extrusion cannula. The superonasal trocar and infusion cannula and associated trocar were then removed and sutured with 7-0 vicryl in an interrupted fashion. Kefzol + polymixin irrigation was then used over the buckle. A subtenon's block containing 0.75% marcaine and 2% lidocaine was administered.              The conjunctiva was closed with 7-0 vicryl sutures. The eye's intraocular pressure was confirmed to be at a physiologic level by digital palpation. Subconjunctival injections of Antibiotic and kenalog were administered. The lid speculum and drapes were removed. Drops of an antibiotic, antihypertensives, and steroid were given. Copious antibiotic ointment was instilled into the eye. The eye was patched and shielded. The patient tolerated the procedure well without any intraoperative or immediate postoperative complications. The patient was taken to the recovery room in good condition. The patient  was instructed to maintain a strict face-down position and will be seen by Dr. Vanessa Barbara tomorrow morning in clinic.

## 2023-02-26 NOTE — Brief Op Note (Signed)
02/26/2023  3:38 PM  PATIENT:  Maynor Neidlinger  47 y.o. male  PRE-OPERATIVE DIAGNOSIS:  retinal detachment of left eye  POST-OPERATIVE DIAGNOSIS:  retinal detachment of left eye  PROCEDURE:  Procedure(s): VITRECTOMY 25 GAUGE WITH SCLERAL BUCKLE (Left) PHOTOCOAGULATION WITH LASER (Right)  SURGEON:  Surgeon(s) and Role:    [name], MD - Primary  ASSISTANTS: Laurian Brim, Ophthalmic Assistant    ANESTHESIA:   local and general  EBL:  minimal   BLOOD ADMINISTERED:none  DRAINS: none   LOCAL MEDICATIONS USED:  MARCAINE   , LIDOCAINE , and Amount: 9 ml  SPECIMEN:  No Specimen  DISPOSITION OF SPECIMEN:  N/A  COUNTS:  YES  TOURNIQUET:  * No tourniquets in log *  DICTATION: .Note written in EPIC  PLAN OF CARE: Discharge to home after PACU  PATIENT DISPOSITION:  PACU - hemodynamically stable.   Delay start of Pharmacological VTE agent (>24hrs) due to surgical blood loss or risk of bleeding: not applicable

## 2023-02-26 NOTE — Anesthesia Postprocedure Evaluation (Signed)
Anesthesia Post Note  Patient: Kyren Fuentes  Procedure(s) Performed: VITRECTOMY 25 GAUGE WITH SCLERAL BUCKLE (Left: Eye) PHOTOCOAGULATION WITH LASER (Right: Eye)     Patient location during evaluation: PACU Anesthesia Type: General Level of consciousness: awake and alert, patient cooperative and oriented Pain management: pain level controlled Vital Signs Assessment: post-procedure vital signs reviewed and stable Respiratory status: spontaneous breathing, nonlabored ventilation and respiratory function stable Cardiovascular status: blood pressure returned to baseline and stable Postop Assessment: no apparent nausea or vomiting Anesthetic complications: no   No notable events documented.  Last Vitals:  Vitals:   02/26/23 1615 02/26/23 1630  BP: 138/89 131/80  Pulse: 97 93  Resp: 19 (!) 23  Temp:  36.5 C  SpO2: 93% 95%    Last Pain:  Vitals:   02/26/23 1600  TempSrc:   PainSc: 0-No pain                 Naithan Delage,E. Matisse Salais

## 2023-02-26 NOTE — Anesthesia Procedure Notes (Signed)
Procedure Name: Intubation Date/Time: 02/26/2023 11:38 AM  Performed by: Caren Macadam, CRNAPre-anesthesia Checklist: Patient identified, Emergency Drugs available, Suction available and Patient being monitored Patient Re-evaluated:Patient Re-evaluated prior to induction Oxygen Delivery Method: Circle system utilized Preoxygenation: Pre-oxygenation with 100% oxygen Induction Type: IV induction Ventilation: Mask ventilation without difficulty Laryngoscope Size: Miller and 2 Grade View: Grade II Tube type: Oral Tube size: 7.5 mm Number of attempts: 1 Airway Equipment and Method: Stylet Placement Confirmation: ETT inserted through vocal cords under direct vision, positive ETCO2 and breath sounds checked- equal and bilateral Secured at: 23 cm Tube secured with: Tape Dental Injury: Teeth and Oropharynx as per pre-operative assessment

## 2023-02-26 NOTE — Transfer of Care (Signed)
Immediate Anesthesia Transfer of Care Note  Patient: Nicolas [name]  Procedure(s) Performed: VITRECTOMY 25 GAUGE WITH SCLERAL BUCKLE (Left: Eye) PHOTOCOAGULATION WITH LASER (Right: Eye)  Patient Location: PACU  Anesthesia Type:General  Level of Consciousness: drowsy  Airway & Oxygen Therapy: Patient Spontanous Breathing  Post-op Assessment: Report given to RN and Post -op Vital signs reviewed and stable  Post vital signs: Reviewed and stable  Last Vitals:  Vitals Value Taken Time  BP 137/82 02/26/23 1555  Temp    Pulse 99 02/26/23 1557  Resp 19 02/26/23 1557  SpO2 91 % 02/26/23 1557  Vitals shown include unvalidated device data.  Last Pain:  Vitals:   02/26/23 0933  TempSrc:   PainSc: 7          Complications: No notable events documented.

## 2023-02-26 NOTE — Discharge Instructions (Addendum)
POSTOPERATIVE INSTRUCTIONS  Your doctor has performed vitreoretinal surgery on you at [facility].  - Keep eye patched and shielded until seen by Dr. Zamora 8 AM tomorrow in clinic - Do not use drops until return - FACE DOWN POSITIONING WHILE AWAKE - Sleep with belly down or on right side, avoid laying flat on back.    - No strenuous bending, stooping or lifting.  - You may not drive until further notice.  - If your doctor used a gas bubble in your eye during the procedure he will advise you on postoperative positioning. If you have a gas bubble you will be wearing a green bracelet that was applied in the operating room. The green bracelet should stay on as long as the gas bubble is in your eye. While the gas bubble is present you should not fly in an airplane. If you require general anesthesia while the gas bubble is present you must notify your anesthesiologist that an intraocular gas bubble is present so he can take the appropriate precautions.  - Tylenol or any other over-the-counter pain reliever can be used according to your doctor. If more pain medicine is required, your doctor will have a prescription for you.  - You may read, go up and down stairs, and watch television.     Brian Zamora, M.D., Ph.D.  

## 2023-02-27 ENCOUNTER — Encounter (INDEPENDENT_AMBULATORY_CARE_PROVIDER_SITE_OTHER): Payer: Self-pay | Admitting: Ophthalmology

## 2023-02-27 ENCOUNTER — Encounter (INDEPENDENT_AMBULATORY_CARE_PROVIDER_SITE_OTHER): Payer: 59 | Admitting: Ophthalmology

## 2023-02-27 ENCOUNTER — Ambulatory Visit (INDEPENDENT_AMBULATORY_CARE_PROVIDER_SITE_OTHER): Payer: 59 | Admitting: Ophthalmology

## 2023-02-27 DIAGNOSIS — I10 Essential (primary) hypertension: Secondary | ICD-10-CM

## 2023-02-27 DIAGNOSIS — D571 Sickle-cell disease without crisis: Secondary | ICD-10-CM

## 2023-02-27 DIAGNOSIS — H25813 Combined forms of age-related cataract, bilateral: Secondary | ICD-10-CM

## 2023-02-27 DIAGNOSIS — H36829 Proliferative sickle-cell retinopathy, unspecified eye: Secondary | ICD-10-CM

## 2023-02-27 DIAGNOSIS — E119 Type 2 diabetes mellitus without complications: Secondary | ICD-10-CM

## 2023-02-27 DIAGNOSIS — H3322 Serous retinal detachment, left eye: Secondary | ICD-10-CM

## 2023-02-27 DIAGNOSIS — H35033 Hypertensive retinopathy, bilateral: Secondary | ICD-10-CM

## 2023-02-27 DIAGNOSIS — Z7985 Long-term (current) use of injectable non-insulin antidiabetic drugs: Secondary | ICD-10-CM

## 2023-02-27 DIAGNOSIS — Z7984 Long term (current) use of oral hypoglycemic drugs: Secondary | ICD-10-CM

## 2023-02-27 NOTE — Progress Notes (Signed)
[facility] - Clinic Note  02/27/2023   CHIEF COMPLAINT Patient presents for Retina Follow Up  HISTORY OF PRESENT ILLNESS: Nicolas [name] is a 47 y.o. male who presents to the clinic today for:  HPI     Retina Follow Up   Patient presents with  Retinal Break/Detachment.  In left eye.  This started 1 day ago.  Duration of 1 day.  Since onset it is stable.  I, the attending physician,  performed the HPI with the patient and updated documentation appropriately.        Comments   1 day retina follow up RD PO OS pt is reporting pain and discomfort it little better today       Last edited by [name], MD on 02/27/2023  1:08 PM.    Pt is here on the referral of Dr. Karleen Hampshire for concern of TRD OS, pt saw him yesterday, but states the left eye vision has been decreased for about a month, pt states he is diabetic and sees Dr. Karleen Hampshire every year and has never been told that he has diabetes in his eyes, pt also has sickle cell disease, he states he had a crisis in December   Referring physician: Dorothyann Peng, MD [address] [place],  [place] [address]  HISTORICAL INFORMATION:  Selected notes from the MEDICAL RECORD NUMBER Referred by Dr. Karleen Hampshire for TRD OS LEE:  Ocular Hx- PMH-   CURRENT MEDICATIONS: No current facility-administered medications for this visit. (Ophthalmic Drugs)   No current outpatient medications on file. (Ophthalmic Drugs)   No current facility-administered medications for this visit. (Other)   Current Outpatient Medications (Other)  Medication Sig   HYDROcodone-acetaminophen (NORCO/VICODIN) 5-325 MG tablet Take 1 tablet by mouth every 4 (four) hours as needed for moderate pain.   Facility-Administered Medications Ordered in Other Visits (Other)  Medication Route   0.9 %  sodium chloride infusion Intravenous   acetaminophen (OFIRMEV) IV 1,000 mg Intravenous   amisulpride (BARHEMSYS) injection 10 mg Intravenous   fentaNYL  (SUBLIMAZE) injection 25-50 mcg Intravenous   insulin aspart (novoLOG) injection 0-14 Units Subcutaneous   REVIEW OF SYSTEMS: ROS   Positive for: Endocrine, Cardiovascular, Eyes Negative for: Constitutional, Gastrointestinal, Neurological, Skin, Genitourinary, Musculoskeletal, HENT, Respiratory, Psychiatric, Allergic/Imm, Heme/Lymph Last edited by Etheleen Mayhew, COT on 02/27/2023  9:44 AM.     ALLERGIES No Known Allergies PAST MEDICAL HISTORY Past Medical History:  Diagnosis Date   Arthritis    arthritis- back & L hip   Depression    situational depression, pt. out of work    Diabetes mellitus (HCC)    HTN (hypertension) 05/10/2015   Osteoarthritis of left hip 06/07/2012   Sickle cell anemia (HCC)    Past Surgical History:  Procedure Laterality Date   IR GENERIC HISTORICAL  09/26/2016   IR FLUORO GUIDE CV LINE RIGHT 09/26/2016 Irish Lack, MD [facility]   IR GENERIC HISTORICAL  09/26/2016   IR US GUIDE VASC ACCESS RIGHT 09/26/2016 Irish Lack, MD [facility]   JOINT REPLACEMENT Right 2012   R hip   TOTAL HIP ARTHROPLASTY  06/07/2012   Procedure: TOTAL HIP ARTHROPLASTY;  Surgeon: Eulas Post, MD;  Location: [facility] OR;  Service: Orthopedics;  Laterality: Left;   FAMILY HISTORY Family History  Problem Relation Age of Onset   Diabetes Mother    Cancer - Other Father    Liver cancer Father    CAD Neg Hx    SOCIAL  HISTORY Social History   Tobacco Use   Smoking status: Never   Smokeless tobacco: Never  Vaping Use   Vaping Use: Never used  Substance Use Topics   Alcohol use: Not Currently    Comment: ocassional    Drug use: Never       OPHTHALMIC EXAM:  Base Eye Exam     Visual Acuity (Snellen - Linear)       Right Left   Dist [place] 20/40 -2 LP   Dist ph [place] NI          Tonometry (Tonopen, 9:51 AM)       Right Left   Pressure 21 35         Tonometry #2 (Tonopen, 10:18 AM)       Right Left   Pressure  19         Pupils        Pupils Dark Light Shape React APD   Right PERRL 3 2 Round Brisk None   Left PERRL 5 5 Round dilated          Visual Fields       Left Right     Full   Restrictions Total superior temporal, inferior temporal, superior nasal, inferior nasal deficiencies          Extraocular Movement       Right Left    Full, Ortho Full, Ortho         Neuro/Psych     Oriented x3: Yes   Mood/Affect: Normal         Dilation     Left eye: 2.5% Phenylephrine @ 9:49 AM           Slit Lamp and Fundus Exam     Slit Lamp Exam       Right Left   Lids/Lashes Dermatochalasis - upper lid Dermatochalasis - upper lid   Conjunctiva/Sclera 2+ Injection, Melanosis 1+ Injection, Melanosis, Subconjunctival hemorrhage, sutures intact.   Cornea Clear Central epi defect   Anterior Chamber deep and clear Deep, early fibrin reaction, 2+cell, 2+pigment   Iris Round and dilated, No NVI Round and dilated, No NVI   Lens 2+ Nuclear sclerosis, 2+ Cortical cataract 2+ Nuclear sclerosis, 2+ Cortical cataract, 2-3+ PC feathering   Anterior Vitreous clear Post vitrectomy, good gas fill.         Fundus Exam       Right Left   Disc Pink and Sharp, +PPP Hazy fused, perfused   C/D Ratio 0.2 0.4   Macula Flat, Blunted foveal reflex, RPE mottling, No heme or edema Grossly attached under gas.   Vessels attenuated, mild tortuosity, fibrotic NV IT, early NV nasal periphery attenuated, Tortuous, AV crossing changes, fibrotic NVE IT periphery with surrounding heme, fibrotic NVE nasal periphery, ?stretch hole at 0800; hazy view   Periphery Attached, fibrotic NV IT, early NV nasal periphery, good segmental PRP changes surrounding. Retinal attached over buckle, good buckle height, good laser over buckle. Tractional fibrosis improved. PRE OP: total TRD greatest nasal and inferior quads; fibrotic NVE nasal and IT periphery; ?stretch hole at 0800           Refraction     Wearing Rx       Sphere Cylinder Axis Add    Right +1.25 +0.25 086 +2.50   Left +1.25 +0.25 093 +2.50           IMAGING AND PROCEDURES  Imaging and Procedures for 02/27/2023  ASSESSMENT/PLAN:   ICD-10-CM   1. Left retinal detachment  H33.22     2. 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 Sickle cell disease without crisis (HCC)  D57.1     4. Essential hypertension  I10     5. Hypertensive retinopathy of both eyes  H35.033     6. Diabetes mellitus type 2 without retinopathy (HCC)  E11.9     7. Long term (current) use of oral hypoglycemic drugs  Z79.84     8. Long-term (current) use of injectable non-insulin antidiabetic drugs  Z79.85     9. Combined forms of age-related cataract of both eyes  H25.813      Retinal detachment, OS - pt reports ~1 month history of decreased vision OS - BCVA 20/60 OS  - exam shows near total tractional detachment w/ ?rhegmatogenous component -- detachment greatest in nasal and inferior quadrants  - focal areas of tractional fibrosis nasal and inferotemporal periphery w/ associated neovascularization  - ?stretch hole at 0800 - pt with history of sickle cell disease ([place]) -- retinopathy likely etiology of traction and fibrosis - POD1 s/p PPV/PFO/EL/FAX/14% C3F8 OS, 05.30.2024             - doing well this morning             - retina attached and in good position -- good buckle height and laser around breaks             - IOP 19             - start   PF 6x/day OS                         zymaxid QID OS                         Atropine BID OS                         Cosopt BID OS                         PSO ung QID/PRN OS              - cont face down positioning x3 days; avoid laying flat on back              - eye shield when sleeping              - post op drop and positioning instructions reviewed              - tylenol/ibuprofen for pain              - Rx given for breakthrough pain  - f/u next Thursday, POV  2,3.  Proliferative retinopathy from Sickle cell disease OU  - examination and FA show areas of neovascularization OU (OS > OD)  - s/p PRP OD (05.23.24), intra op (05.30.24) - cont PF QID OD x7 days  4,5. Hypertensive retinopathy OU - discussed importance of tight BP control - monitor  6-8. Diabetes mellitus, type 2 without retinopathy  - A1c 4.2 on 05.20.24  - FA (05.23.24) with no significant MA OU - The incidence, risk factors for progression, natural history and treatment options for diabetic retinopathy  were discussed with patient.   -  The need for close monitoring of blood glucose, blood pressure, and serum lipids, avoiding cigarette or any type of tobacco, and the need for long term follow up was also discussed with patient. - f/u in 1 year, sooner prn  9. Mixed Cataract OU - The symptoms of cataract, surgical options, and treatments and risks were discussed with patient. - discussed diagnosis and likely progression post PPV - monitor  Ophthalmic Meds Ordered this visit:  No orders of the defined types were placed in this encounter.    Return in about 6 days (around 03/05/2023) for f/u next Thursday, RD OS, DFE, POV.  There are no Patient Instructions on file for this visit.  Explained the diagnoses, plan, and follow up with the patient and they expressed understanding.  Patient expressed understanding of the importance of proper follow up care.   This document serves as a record of services personally performed by Karie Chimera, MD, PhD. It was created on their behalf by Glee Arvin. Manson Passey, OA an ophthalmic technician. The creation of this record is the provider's dictation and/or activities during the visit.    Electronically signed by: Glee Arvin. Manson Passey, [place] 05.31.2024 1:13 PM  Karie Chimera, M.D., Ph.D. Diseases & Surgery of the Retina and Vitreous [facility] 02/27/2023  I have reviewed the above documentation for accuracy and completeness, and I agree  with the above. Karie Chimera, M.D., Ph.D. 02/27/23 1:13 PM    Abbreviations: M myopia (nearsighted); A astigmatism; H hyperopia (farsighted); P presbyopia; Mrx spectacle prescription;  CTL contact lenses; OD right eye; OS left eye; OU both eyes  XT exotropia; ET esotropia; PEK punctate epithelial keratitis; PEE punctate epithelial erosions; DES dry eye syndrome; MGD meibomian gland dysfunction; ATs artificial tears; PFAT's preservative free artificial tears; NSC nuclear sclerotic cataract; PSC posterior subcapsular cataract; ERM epi-retinal membrane; PVD posterior vitreous detachment; RD retinal detachment; DM diabetes mellitus; DR diabetic retinopathy; NPDR non-proliferative diabetic retinopathy; PDR proliferative diabetic retinopathy; CSME clinically significant macular edema; DME diabetic macular edema; dbh dot blot hemorrhages; CWS cotton wool spot; POAG primary open angle glaucoma; C/D cup-to-disc ratio; HVF humphrey visual field; GVF goldmann visual field; OCT optical coherence tomography; IOP intraocular pressure; BRVO Branch retinal vein occlusion; CRVO central retinal vein occlusion; CRAO central retinal artery occlusion; BRAO branch retinal artery occlusion; RT retinal tear; SB scleral buckle; PPV pars plana vitrectomy; VH Vitreous hemorrhage; PRP panretinal laser photocoagulation; IVK intravitreal kenalog; VMT vitreomacular traction; MH Macular hole;  NVD neovascularization of the disc; NVE neovascularization elsewhere; AREDS age related eye disease study; ARMD age related macular degeneration; POAG primary open angle glaucoma; EBMD epithelial/anterior basement membrane dystrophy; ACIOL anterior chamber intraocular lens; IOL intraocular lens; PCIOL posterior chamber intraocular lens; Phaco/IOL phacoemulsification with intraocular lens placement; PRK photorefractive keratectomy; LASIK laser assisted in situ keratomileusis; HTN hypertension; DM diabetes mellitus; COPD chronic obstructive pulmonary  disease

## 2023-02-28 ENCOUNTER — Encounter (HOSPITAL_COMMUNITY): Payer: Self-pay | Admitting: Ophthalmology

## 2023-03-03 NOTE — Progress Notes (Signed)
[facility] - Clinic Note  03/05/2023   CHIEF COMPLAINT Patient presents for Retina Follow Up  HISTORY OF PRESENT ILLNESS: Nicolas [name] is a 47 y.o. male who presents to the clinic today for:  HPI     Retina Follow Up   Patient presents with  Retinal Break/Detachment.  In left eye.  This started 1 week ago.  Duration of 1 week.  Since onset it is gradually improving.  I, the attending physician,  performed the HPI with the patient and updated documentation appropriately.        Comments   1 week RD OS pt is reporting that he can see slight improvement he denies any flashes or floaters       Last edited by [name], MD on 03/05/2023  9:28 PM.    Patient states that he is keeping his head down and using the drops as instructed.   Referring physician: Dorothyann Peng, MD [address] [place],  [place] [address]  HISTORICAL INFORMATION:  Selected notes from the MEDICAL RECORD NUMBER Referred by Dr. Karleen Hampshire for TRD OS LEE:  Ocular Hx- PMH-   CURRENT MEDICATIONS: Current Outpatient Medications (Ophthalmic Drugs)  Medication Sig   bacitracin-polymyxin b (POLYSPORIN) ophthalmic ointment Place into the left eye 4 (four) times daily for 10 days. Place a 1/2 inch ribbon of ointment into the lower eyelid.   prednisoLONE acetate (PRED FORTE) 1 % ophthalmic suspension Place 1 drop into the left eye 4 (four) times daily.   No current facility-administered medications for this visit. (Ophthalmic Drugs)   Current Outpatient Medications (Other)  Medication Sig   amLODipine (NORVASC) 10 MG tablet TAKE 1 TABLET BY MOUTH EVERY DAY (Patient taking differently: Take 10 mg by mouth daily.)   carvedilol (COREG) 6.25 MG tablet TAKE 1 TABLET BY MOUTH TWICE A DAY WITH MEALS (Patient taking differently: Take 6.25 mg by mouth 2 (two) times daily with a meal.)   Cholecalciferol (VITAMIN D3) 2000 units capsule Take 2,000 Units by mouth daily.   doxycycline (MONODOX)  100 MG capsule Take 1 capsule (100 mg total) by mouth 2 (two) times daily.   folic acid (FOLVITE) 1 MG tablet Take 1 tablet (1 mg total) by mouth daily. (Patient taking differently: Take 1 mg by mouth 2 (two) times daily.)   glucose blood test strip Use as instructed (Patient taking differently: 1 each by Other route every other day. Use as instructed)   HYDROcodone-acetaminophen (NORCO/VICODIN) 5-325 MG tablet Take 1 tablet by mouth every 4 (four) hours as needed for moderate pain.   Lancets (ACCU-CHEK MULTICLIX) lancets Use as instructed (Patient taking differently: 1 each by Other route every other day.)   metFORMIN (GLUCOPHAGE) 500 MG tablet TAKE 1 TABLET BY MOUTH TWICE A DAY (Patient taking differently: Take 500 mg by mouth 2 (two) times daily with a meal.)   Multiple Vitamin (ONE-A-DAY MENS PO) Take 1 tablet by mouth daily with breakfast.   OXBRYTA 500 MG TABS tablet Take 1,500 mg by mouth daily.   oxyCODONE-acetaminophen (PERCOCET) 7.5-325 MG tablet Take 1 tablet by mouth every 4 (four) hours as needed for severe pain.   OZEMPIC, 0.25 OR 0.5 MG/DOSE, 2 MG/1.5ML SOPN INJECT 0.5 MG INTO THE SKIN ONCE A WEEK. (Patient taking differently: Inject 0.25 mg into the skin every Friday.)   pravastatin (PRAVACHOL) 20 MG tablet TAKE 1 TABLET BY MOUTH EVERY DAY IN THE EVENING   triamterene-hydrochlorothiazide (MAXZIDE-25) 37.5-25 MG tablet TAKE 1 TABLET BY  MOUTH EVERY DAY   valsartan (DIOVAN) 160 MG tablet Take 1 tablet (160 mg total) by mouth daily.   No current facility-administered medications for this visit. (Other)   REVIEW OF SYSTEMS: ROS   Positive for: Endocrine, Cardiovascular, Eyes Negative for: Constitutional, Gastrointestinal, Neurological, Skin, Genitourinary, Musculoskeletal, HENT, Respiratory, Psychiatric, Allergic/Imm, Heme/Lymph Last edited by Etheleen Mayhew, COT on 03/05/2023  9:22 AM.     ALLERGIES No Known Allergies PAST MEDICAL HISTORY Past Medical History:  Diagnosis  Date   Arthritis    arthritis- back & L hip   Depression    situational depression, pt. out of work    Diabetes mellitus (HCC)    HTN (hypertension) 05/10/2015   Osteoarthritis of left hip 06/07/2012   Sickle cell anemia (HCC)    Past Surgical History:  Procedure Laterality Date   IR GENERIC HISTORICAL  09/26/2016   IR FLUORO GUIDE CV LINE RIGHT 09/26/2016 Irish Lack, MD [facility]   IR GENERIC HISTORICAL  09/26/2016   IR US GUIDE VASC ACCESS RIGHT 09/26/2016 Irish Lack, MD [facility]   JOINT REPLACEMENT Right 2012   R hip   PHOTOCOAGULATION WITH LASER Right 02/26/2023   Procedure: PHOTOCOAGULATION WITH LASER;  Surgeon: [name], MD;  Location: [facility] OR;  Service: Ophthalmology;  Laterality: Right;   TOTAL HIP ARTHROPLASTY  06/07/2012   Procedure: TOTAL HIP ARTHROPLASTY;  Surgeon: Eulas Post, MD;  Location: [facility] OR;  Service: Orthopedics;  Laterality: Left;   VITRECTOMY 25 GAUGE WITH SCLERAL BUCKLE Left 02/26/2023   Procedure: VITRECTOMY 25 GAUGE WITH SCLERAL BUCKLE;  Surgeon: [name], MD;  Location: [facility] OR;  Service: Ophthalmology;  Laterality: Left;   FAMILY HISTORY Family History  Problem Relation Age of Onset   Diabetes Mother    Cancer - Other Father    Liver cancer Father    CAD Neg Hx    SOCIAL HISTORY Social History   Tobacco Use   Smoking status: Never   Smokeless tobacco: Never  Vaping Use   Vaping Use: Never used  Substance Use Topics   Alcohol use: Not Currently    Comment: ocassional    Drug use: Never       OPHTHALMIC EXAM:  Base Eye Exam     Visual Acuity (Snellen - Linear)       Right Left   Dist cc 20/25 -2 20/40 -2   Dist ph cc NI NI    Correction: Glasses         Tonometry (Tonopen, 9:27 AM)       Right Left   Pressure 19 18         Pupils       Pupils Dark Light Shape React APD   Right PERRL 3 2 Round Brisk None   Left PERRL 5 5 Round Brisk None         Visual Fields       Left Right    Restrictions Total superior temporal, inferior temporal, inferior nasal deficiencies; Partial inner superior nasal deficiency          Extraocular Movement       Right Left    Full, Ortho Full, Ortho         Neuro/Psych     Oriented x3: Yes   Mood/Affect: Normal         Dilation     Both eyes: 2.5% Phenylephrine @ 9:27 AM           Slit Lamp and Fundus  Exam     Slit Lamp Exam       Right Left   Lids/Lashes Dermatochalasis - upper lid Dermatochalasis - upper lid   Conjunctiva/Sclera 2+ Injection, Melanosis 1+ Injection, Melanosis, Subconjunctival hemorrhage, sutures intact.   Cornea Clear Central epi defect - closed   Anterior Chamber deep and clear Deep, early fibrin reaction- resolved, .5+cell, .5+pigment   Iris Round and dilated, No NVI dilated, No NVI, focal PS 08:30   Lens 2+ Nuclear sclerosis, 2+ Cortical cataract 2+ Nuclear sclerosis, 2+ Cortical cataract, 2-3+ PC feathering   Anterior Vitreous clear Post vitrectomy, good gas fill.         Fundus Exam       Right Left   Disc Pink and Sharp, +PPP Hazy fused, perfused   C/D Ratio 0.2 0.4   Macula Flat, Blunted foveal reflex, RPE mottling, No heme or edema Hazy view, grossly attached under gas.   Vessels attenuated, mild tortuosity, fibrotic NV IT, early NV nasal periphery attenuated, Tortuous, AV crossing changes, fibrotic NVE IT periphery with surrounding heme, fibrotic NVE nasal periphery, ?stretch hole at 0800; hazy view   Periphery Attached, fibrotic NV IT, early NV nasal periphery, good segmental PRP changes surrounding both areas Retina attached over buckle, good buckle height, good laser over buckle. Tractional fibrosis improved. ?Mild heme inferiorly; PRE-OP: total TRD greatest nasal and inferior quads; fibrotic NVE nasal and IT periphery; ?stretch hole at 0800           Refraction     Wearing Rx       Sphere Cylinder Axis Add   Right +1.25 +0.25 086 +2.50   Left +1.25 +0.25 093 +2.50            IMAGING AND PROCEDURES  Imaging and Procedures for 03/05/2023         ASSESSMENT/PLAN:   ICD-10-CM   1. Left retinal detachment  H33.22     2. 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 Sickle cell disease without crisis (HCC)  D57.1     4. Essential hypertension  I10     5. Hypertensive retinopathy of both eyes  H35.033     6. Diabetes mellitus type 2 without retinopathy (HCC)  E11.9     7. Long term (current) use of oral hypoglycemic drugs  Z79.84     8. Long-term (current) use of injectable non-insulin antidiabetic drugs  Z79.85     9. Combined forms of age-related cataract of both eyes  H25.813      Retinal detachment, OS - at presentation, pt reported ~1 month history of decreased vision OS - exam showed near total tractional detachment w/ ?rhegmatogenous component -- detachment greatest in nasal and inferior quadrants - focal areas of tractional fibrosis nasal and inferotemporal periphery w/ associated neovascularization  - retinal hole at 0800 - pt with history of sickle cell disease ([place]) -- retinopathy likely etiology of traction and fibrosis - POW1 s/p PPV/PFO/EL/FAX/14% C3F8 OS, 05.30.2024             - doing well  - retina attached and in good position -- good buckle height and laser around breaks             - IOP 18             - cont   PF 4x/day OS  zymaxid QID OS -- stop Sunday                         Cosopt BID OS                         PSO ung QID/PRN OS    Stop Atropine             - cont face down positioning 30 min/hr; avoid laying flat on back              - eye shield when sleeping x1 more week             - post op drop and positioning instructions reviewed              - tylenol/ibuprofen for pain              - Rx given for breakthrough pain  - f/u 2-3 weeks, POV  2,3. Proliferative retinopathy from Sickle cell disease OU  - examination and FA show areas of  neovascularization OU (OS > OD)  - s/p PRP OD (05.23.24), intra op (05.30.24) - ok to stop PF OD  4,5. Hypertensive retinopathy OU - discussed importance of tight BP control - monitor  6-8. Diabetes mellitus, type 2 without retinopathy  - A1c 4.2 on 05.20.24  - FA (05.23.24) with no significant MA OU - The incidence, risk factors for progression, natural history and treatment options for diabetic retinopathy  were discussed with patient.   - The need for close monitoring of blood glucose, blood pressure, and serum lipids, avoiding cigarette or any type of tobacco, and the need for long term follow up was also discussed with patient. - f/u in 1 year, sooner prn  9. Mixed Cataract OU - The symptoms of cataract, surgical options, and treatments and risks were discussed with patient. - discussed diagnosis and likely progression post PPV - monitor  Ophthalmic Meds Ordered this visit:  Meds ordered this encounter  Medications   prednisoLONE acetate (PRED FORTE) 1 % ophthalmic suspension    Sig: Place 1 drop into the left eye 4 (four) times daily.    Dispense:  10 mL    Refill:  1   bacitracin-polymyxin b (POLYSPORIN) ophthalmic ointment    Sig: Place into the left eye 4 (four) times daily for 10 days. Place a 1/2 inch ribbon of ointment into the lower eyelid.    Dispense:  3.5 g    Refill:  1     Return in about 3 weeks (around 03/26/2023) for DFE, OCT.  There are no Patient Instructions on file for this visit.  Explained the diagnoses, plan, and follow up with the patient and they expressed understanding.  Patient expressed understanding of the importance of proper follow up care.   This document serves as a record of services personally performed by Karie Chimera, MD, PhD. It was created on their behalf by Glee Arvin. Manson Passey, OA an ophthalmic technician. The creation of this record is the provider's dictation and/or activities during the visit.    Electronically signed by: Glee Arvin. Manson Passey, [place] 06.04.2024 9:34 PM  This document serves as a record of services personally performed by Karie Chimera, MD, PhD. It was created on their behalf by Gerilyn Nestle, COT an ophthalmic technician. The creation of this record is the provider's dictation and/or activities during the visit.    Electronically signed  by:  Gerilyn Nestle, COT  6.6.24 9:34 PM  Karie Chimera, M.D., Ph.D. Diseases & Surgery of the Retina and Vitreous [facility] 03/05/2023  I have reviewed the above documentation for accuracy and completeness, and I agree with the above. Karie Chimera, M.D., Ph.D. 03/05/23 9:38 PM  Abbreviations: M myopia (nearsighted); A astigmatism; H hyperopia (farsighted); P presbyopia; Mrx spectacle prescription;  CTL contact lenses; OD right eye; OS left eye; OU both eyes  XT exotropia; ET esotropia; PEK punctate epithelial keratitis; PEE punctate epithelial erosions; DES dry eye syndrome; MGD meibomian gland dysfunction; ATs artificial tears; PFAT's preservative free artificial tears; NSC nuclear sclerotic cataract; PSC posterior subcapsular cataract; ERM epi-retinal membrane; PVD posterior vitreous detachment; RD retinal detachment; DM diabetes mellitus; DR diabetic retinopathy; NPDR non-proliferative diabetic retinopathy; PDR proliferative diabetic retinopathy; CSME clinically significant macular edema; DME diabetic macular edema; dbh dot blot hemorrhages; CWS cotton wool spot; POAG primary open angle glaucoma; C/D cup-to-disc ratio; HVF humphrey visual field; GVF goldmann visual field; OCT optical coherence tomography; IOP intraocular pressure; BRVO Branch retinal vein occlusion; CRVO central retinal vein occlusion; CRAO central retinal artery occlusion; BRAO branch retinal artery occlusion; RT retinal tear; SB scleral buckle; PPV pars plana vitrectomy; VH Vitreous hemorrhage; PRP panretinal laser photocoagulation; IVK intravitreal kenalog; VMT vitreomacular  traction; MH Macular hole;  NVD neovascularization of the disc; NVE neovascularization elsewhere; AREDS age related eye disease study; ARMD age related macular degeneration; POAG primary open angle glaucoma; EBMD epithelial/anterior basement membrane dystrophy; ACIOL anterior chamber intraocular lens; IOL intraocular lens; PCIOL posterior chamber intraocular lens; Phaco/IOL phacoemulsification with intraocular lens placement; PRK photorefractive keratectomy; LASIK laser assisted in situ keratomileusis; HTN hypertension; DM diabetes mellitus; COPD chronic obstructive pulmonary disease

## 2023-03-04 ENCOUNTER — Ambulatory Visit: Payer: Medicare Other

## 2023-03-05 ENCOUNTER — Encounter (INDEPENDENT_AMBULATORY_CARE_PROVIDER_SITE_OTHER): Payer: Self-pay | Admitting: Ophthalmology

## 2023-03-05 ENCOUNTER — Encounter: Payer: Self-pay | Admitting: Internal Medicine

## 2023-03-05 ENCOUNTER — Ambulatory Visit (INDEPENDENT_AMBULATORY_CARE_PROVIDER_SITE_OTHER): Payer: 59 | Admitting: Ophthalmology

## 2023-03-05 ENCOUNTER — Ambulatory Visit (INDEPENDENT_AMBULATORY_CARE_PROVIDER_SITE_OTHER): Payer: 59 | Admitting: Internal Medicine

## 2023-03-05 VITALS — BP 130/84 | HR 85 | Temp 98.8°F | Ht 68.0 in | Wt 217.0 lb

## 2023-03-05 DIAGNOSIS — H3322 Serous retinal detachment, left eye: Secondary | ICD-10-CM

## 2023-03-05 DIAGNOSIS — N182 Chronic kidney disease, stage 2 (mild): Secondary | ICD-10-CM | POA: Diagnosis not present

## 2023-03-05 DIAGNOSIS — Z0001 Encounter for general adult medical examination with abnormal findings: Secondary | ICD-10-CM

## 2023-03-05 DIAGNOSIS — Z6832 Body mass index (BMI) 32.0-32.9, adult: Secondary | ICD-10-CM

## 2023-03-05 DIAGNOSIS — Z7984 Long term (current) use of oral hypoglycemic drugs: Secondary | ICD-10-CM

## 2023-03-05 DIAGNOSIS — E6609 Other obesity due to excess calories: Secondary | ICD-10-CM

## 2023-03-05 DIAGNOSIS — Z0000 Encounter for general adult medical examination without abnormal findings: Secondary | ICD-10-CM

## 2023-03-05 DIAGNOSIS — E1122 Type 2 diabetes mellitus with diabetic chronic kidney disease: Secondary | ICD-10-CM

## 2023-03-05 DIAGNOSIS — H25813 Combined forms of age-related cataract, bilateral: Secondary | ICD-10-CM

## 2023-03-05 DIAGNOSIS — I129 Hypertensive chronic kidney disease with stage 1 through stage 4 chronic kidney disease, or unspecified chronic kidney disease: Secondary | ICD-10-CM

## 2023-03-05 DIAGNOSIS — I10 Essential (primary) hypertension: Secondary | ICD-10-CM

## 2023-03-05 DIAGNOSIS — H36829 Proliferative sickle-cell retinopathy, unspecified eye: Secondary | ICD-10-CM

## 2023-03-05 DIAGNOSIS — D571 Sickle-cell disease without crisis: Secondary | ICD-10-CM

## 2023-03-05 DIAGNOSIS — Z7985 Long-term (current) use of injectable non-insulin antidiabetic drugs: Secondary | ICD-10-CM

## 2023-03-05 DIAGNOSIS — R351 Nocturia: Secondary | ICD-10-CM

## 2023-03-05 DIAGNOSIS — E119 Type 2 diabetes mellitus without complications: Secondary | ICD-10-CM

## 2023-03-05 DIAGNOSIS — H35033 Hypertensive retinopathy, bilateral: Secondary | ICD-10-CM

## 2023-03-05 MED ORDER — BACITRACIN-POLYMYXIN B 500-10000 UNIT/GM OP OINT: TOPICAL_OINTMENT | Freq: Four times a day (QID) | OPHTHALMIC | 1 refills | Status: AC

## 2023-03-05 MED ORDER — PREDNISOLONE ACETATE 1 % OP SUSP: 1.0000 [drp] | Freq: Four times a day (QID) | OPHTHALMIC | 1 refills | Status: DC

## 2023-03-05 NOTE — Progress Notes (Signed)
Subjective:   Patient ID: Nicolas [name] , male    DOB: October 20, 1975 , 47 y.o.   MRN: [id number]  Chief Complaint  Patient presents with   Annual Exam   Hypertension   Diabetes    HPI  He is here today for a full physical exam.  He has no specific concerns or complaints at this time.  He reports compliance with meds, denies headaches, chest pain and shortness of breath. Does not wish to have prostate exam performed today.   EKG performed on 02/24/2023.  Diabetes He presents for his follow-up diabetic visit. He has type 2 diabetes mellitus. There are no hypoglycemic associated symptoms. Pertinent negatives for diabetes include no blurred vision. There are no hypoglycemic complications. Diabetic complications include nephropathy. Risk factors for coronary artery disease include diabetes mellitus, dyslipidemia, hypertension, male sex, sedentary lifestyle and obesity. Current diabetic treatment includes oral agent (dual therapy). He is following a diabetic diet. He participates in exercise intermittently. His home blood glucose trend is fluctuating minimally. His breakfast blood glucose is taken between 8-9 am. His breakfast blood glucose range is generally 110-130 mg/dl. An ACE inhibitor/angiotensin II receptor blocker is being taken. He does not see a podiatrist.Eye exam is current.  Hypertension This is a chronic problem. The current episode started more than 1 year ago. The problem has been gradually improving since onset. The problem is controlled. Pertinent negatives include no blurred vision. Past treatments include ACE inhibitors, calcium channel blockers and beta blockers. The current treatment provides moderate improvement. Compliance problems include exercise.  Hypertensive end-organ damage includes kidney disease.     Past Medical History:  Diagnosis Date   Arthritis    arthritis- back & L hip   Depression    situational depression, pt. out of work    Diabetes mellitus (HCC)    HTN  (hypertension) 05/10/2015   Osteoarthritis of left hip 06/07/2012   Sickle cell anemia (HCC)      Family History  Problem Relation Age of Onset   Diabetes Mother    Cancer - Other Father    Liver cancer Father    CAD Neg Hx      Current Outpatient Medications:    amLODipine (NORVASC) 10 MG tablet, TAKE 1 TABLET BY MOUTH EVERY DAY (Patient taking differently: Take 10 mg by mouth daily.), Disp: 90 tablet, Rfl: 1   bacitracin-polymyxin b (POLYSPORIN) ophthalmic ointment, Place into the left eye 4 (four) times daily for 10 days. Place a 1/2 inch ribbon of ointment into the lower eyelid., Disp: 3.5 g, Rfl: 1   carvedilol (COREG) 6.25 MG tablet, TAKE 1 TABLET BY MOUTH TWICE A DAY WITH MEALS (Patient taking differently: Take 6.25 mg by mouth 2 (two) times daily with a meal.), Disp: 180 tablet, Rfl: 1   Cholecalciferol (VITAMIN D3) 2000 units capsule, Take 2,000 Units by mouth daily., Disp: , Rfl: 5   doxycycline (MONODOX) 100 MG capsule, Take 1 capsule (100 mg total) by mouth 2 (two) times daily. (Patient not taking: Reported on 03/11/2023), Disp: 14 capsule, Rfl: 0   folic acid (FOLVITE) 1 MG tablet, Take 1 tablet (1 mg total) by mouth daily. (Patient taking differently: Take 1 mg by mouth 2 (two) times daily.), Disp: 30 tablet, Rfl: 11   glucose blood test strip, Use as instructed (Patient taking differently: 1 each by Other route every other day. Use as instructed), Disp: 100 each, Rfl: 12   HYDROcodone-acetaminophen (NORCO/VICODIN) 5-325 MG tablet, Take 1 tablet by mouth every  4 (four) hours as needed for moderate pain., Disp: 20 tablet, Rfl: 0   Lancets (ACCU-CHEK MULTICLIX) lancets, Use as instructed (Patient taking differently: 1 each by Other route every other day.), Disp: 100 each, Rfl: 12   metFORMIN (GLUCOPHAGE) 500 MG tablet, TAKE 1 TABLET BY MOUTH TWICE A DAY (Patient taking differently: Take 500 mg by mouth 2 (two) times daily with a meal.), Disp: 180 tablet, Rfl: 2   Multiple Vitamin  (ONE-A-DAY MENS PO), Take 1 tablet by mouth daily with breakfast., Disp: , Rfl:    OXBRYTA 500 MG TABS tablet, Take 1,500 mg by mouth daily., Disp: , Rfl:    oxyCODONE-acetaminophen (PERCOCET) 7.5-325 MG tablet, Take 1 tablet by mouth every 4 (four) hours as needed for severe pain., Disp: 20 tablet, Rfl: 0   OZEMPIC, 0.25 OR 0.5 MG/DOSE, 2 MG/1.5ML SOPN, INJECT 0.5 MG INTO THE SKIN ONCE A WEEK. (Patient taking differently: Inject 0.25 mg into the skin every Friday.), Disp: 4.5 mL, Rfl: 3   pravastatin (PRAVACHOL) 20 MG tablet, TAKE 1 TABLET BY MOUTH EVERY DAY IN THE EVENING, Disp: 90 tablet, Rfl: 0   prednisoLONE acetate (PRED FORTE) 1 % ophthalmic suspension, Place 1 drop into the left eye 4 (four) times daily., Disp: 10 mL, Rfl: 1   triamterene-hydrochlorothiazide (MAXZIDE-25) 37.5-25 MG tablet, TAKE 1 TABLET BY MOUTH EVERY DAY, Disp: 90 tablet, Rfl: 1   valsartan (DIOVAN) 160 MG tablet, Take 1 tablet (160 mg total) by mouth daily., Disp: 30 tablet, Rfl: 11   No Known Allergies   Men's preventive visit. Patient Health Questionnaire (PHQ-2) is  Flowsheet Row Office Visit from 02/16/2023 in [facility]  PHQ-2 Total Score 0     . Patient is on a healthy diet. Marital status: Married. Relevant history for alcohol use is:  Social History   Substance and Sexual Activity  Alcohol Use Not Currently   Comment: ocassional   . Relevant history for tobacco use is:  Social History   Tobacco Use  Smoking Status Never  Smokeless Tobacco Never  .   Review of Systems  Constitutional: Negative.   HENT: Negative.    Eyes: Negative.  Negative for blurred vision.  Respiratory: Negative.    Cardiovascular: Negative.   Gastrointestinal: Negative.   Endocrine: Negative.   Genitourinary: Negative.   Musculoskeletal: Negative.   Skin: Negative.   Allergic/Immunologic: Negative.   Neurological: Negative.   Hematological: Negative.   Psychiatric/Behavioral:  Negative.       Today's Vitals   03/05/23 1407  BP: 130/84  Pulse: 85  Temp: 98.8 F (37.1 C)  SpO2: 98%  Weight: 217 lb (98.4 kg)  Height: 5\' 8"  (1.727 m)   Body mass index is 32.99 kg/m.  Wt Readings from Last 3 Encounters:  03/11/23 210 lb (95.3 kg)  03/05/23 217 lb (98.4 kg)  02/26/23 215 lb (97.5 kg)    Objective:  Physical Exam Vitals and nursing note reviewed.  Constitutional:      Appearance: Normal appearance.  HENT:     Head: Normocephalic and atraumatic.     Right Ear: Tympanic membrane, ear canal and external ear normal. There is no impacted cerumen.     Left Ear: Tympanic membrane, ear canal and external ear normal. There is no impacted cerumen.  Eyes:     Extraocular Movements: Extraocular movements intact.  Cardiovascular:     Rate and Rhythm: Normal rate and regular rhythm.     Pulses:  Dorsalis pedis pulses are 2+ on the right side and 2+ on the left side.     Heart sounds: Normal heart sounds.  Pulmonary:     Effort: Pulmonary effort is normal.     Breath sounds: Normal breath sounds.  Abdominal:     General: Bowel sounds are normal.     Palpations: Abdomen is soft.     Comments: Rounded, soft  Genitourinary:    Comments: Deferred  Musculoskeletal:     Cervical back: Normal range of motion.  Feet:     Right foot:     Protective Sensation: 5 sites tested.  5 sites sensed.     Skin integrity: Dry skin present.     Toenail Condition: Right toenails are long.     Left foot:     Protective Sensation: 5 sites tested.  5 sites sensed.     Skin integrity: Dry skin present.     Toenail Condition: Left toenails are long.  Skin:    General: Skin is warm.  Neurological:     General: No focal deficit present.     Mental Status: He is alert.  Psychiatric:        Mood and Affect: Mood normal.         Assessment And Plan:    1. Encounter for general adult medical examination w/o abnormal findings Comments: A full exam was performed.  Importance of monthly self breast exams was discussed with the patient. PATIENT IS ADVISED TO GET 30-45 MINUTES REGULAR EXERCISE NO LESS THAN FOUR TO FIVE DAYS PER WEEK - BOTH WEIGHTBEARING EXERCISES AND AEROBIC ARE RECOMMENDED.  PATIENT IS ADVISED TO FOLLOW A HEALTHY DIET WITH AT LEAST SIX FRUITS/VEGGIES PER DAY, DECREASE INTAKE OF RED MEAT, AND TO INCREASE FISH INTAKE TO TWO DAYS PER WEEK.  MEATS/FISH SHOULD NOT BE FRIED, BAKED OR BROILED IS PREFERABLE.  IT IS ALSO IMPORTANT TO CUT BACK ON YOUR SUGAR INTAKE. PLEASE AVOID ANYTHING WITH ADDED SUGAR, CORN SYRUP OR OTHER SWEETENERS. IF YOU MUST USE A SWEETENER, YOU CAN TRY STEVIA. IT IS ALSO IMPORTANT TO AVOID ARTIFICIALLY SWEETENERS AND DIET BEVERAGES. LASTLY, I SUGGEST WEARING SPF 50 SUNSCREEN ON EXPOSED PARTS AND ESPECIALLY WHEN IN THE DIRECT SUNLIGHT FOR AN EXTENDED PERIOD OF TIME.  PLEASE AVOID FAST FOOD RESTAURANTS AND INCREASE YOUR WATER INTAKE.  2. Type 2 diabetes mellitus with stage 2 chronic kidney disease, without long-term current use of insulin (HCC) Comments: Chronic, diabetic foot exam performed. He will c/w metformin twice daily. I would like to start GLP-1 agonist therapy, SGLT2in may cause volume depletion and may trigger a sickle cell crisis. I DISCUSSED WITH THE PATIENT AT LENGTH REGARDING THE GOALS OF GLYCEMIC CONTROL AND POSSIBLE LONG-TERM COMPLICATIONS.  I  ALSO STRESSED THE IMPORTANCE OF COMPLIANCE WITH HOME GLUCOSE MONITORING, DIETARY RESTRICTIONS INCLUDING AVOIDANCE OF SUGARY DRINKS/PROCESSED FOODS,  ALONG WITH REGULAR EXERCISE.  I  ALSO STRESSED THE IMPORTANCE OF ANNUAL EYE EXAMS, SELF FOOT CARE AND COMPLIANCE WITH OFFICE VISITS. - Lipid panel - BMP8+EGFR - Fructosamine  3. Hypertensive nephropathy Comments: Chronic, fair control. He will c/w valsartan 160mg , Maxzide 37.5/25mg  daily, amlodipine 10mg  and carvedilol 6.2mg  twice daily. F/u 4 months.  4. Left retinal detachment Comments: He is s/p surgical repair on 02/26/23.  5.  Sickle cell disease without crisis (HCC) Comments: Chronic, sx currently stable. He is encouraged to stay well hydrated and to avoid known triggers.  6. Nocturia Comments: I will check a PSA. He declines DRE today. - PSA  7.  Class 1 obesity due to excess calories with serious comorbidity and body mass index (BMI) of 32.0 to 32.9 in adult Comments: He is encouraged to strive for BMI less than 30 to decrease cardiac risk. Advised to aim for at least 150 minutes of exercise per week.   Return for 1 year HM, keep next apt as sch.. Patient was given opportunity to ask questions. Patient verbalized understanding of the plan and was able to repeat key elements of the plan. All questions were answered to their satisfaction.   I, [name], MD, have reviewed all documentation for this visit. The documentation on 03/05/23 for the exam, diagnosis, procedures, and orders are all accurate and complete.

## 2023-03-05 NOTE — Patient Instructions (Signed)
Health Maintenance, Male Adopting a healthy lifestyle and getting preventive care are important in promoting health and wellness. Ask your health care provider about: The right schedule for you to have regular tests and exams. Things you can do on your own to prevent diseases and keep yourself healthy. What should I know about diet, weight, and exercise? Eat a healthy diet  Eat a diet that includes plenty of vegetables, fruits, low-fat dairy products, and lean protein. Do not eat a lot of foods that are high in solid fats, added sugars, or sodium. Maintain a healthy weight Body mass index (BMI) is a measurement that can be used to identify possible weight problems. It estimates body fat based on height and weight. Your health care provider can help determine your BMI and help you achieve or maintain a healthy weight. Get regular exercise Get regular exercise. This is one of the most important things you can do for your health. Most adults should: Exercise for at least 150 minutes each week. The exercise should increase your heart rate and make you sweat (moderate-intensity exercise). Do strengthening exercises at least twice a week. This is in addition to the moderate-intensity exercise. Spend less time sitting. Even light physical activity can be beneficial. Watch cholesterol and blood lipids Have your blood tested for lipids and cholesterol at 49 years of age, then have this test every 5 years. You may need to have your cholesterol levels checked more often if: Your lipid or cholesterol levels are high. You are older than 49 years of age. You are at high risk for heart disease. What should I know about cancer screening? Many types of cancers can be detected early and may often be prevented. Depending on your health history and family history, you may need to have cancer screening at various ages. This may include screening for: Colorectal cancer. Prostate cancer. Skin cancer. Lung  cancer. What should I know about heart disease, diabetes, and high blood pressure? Blood pressure and heart disease High blood pressure causes heart disease and increases the risk of stroke. This is more likely to develop in people who have high blood pressure readings or are overweight. Talk with your health care provider about your target blood pressure readings. Have your blood pressure checked: Every 3-5 years if you are 18-39 years of age. Every year if you are 40 years old or older. If you are between the ages of 65 and 75 and are a current or former smoker, ask your health care provider if you should have a one-time screening for abdominal aortic aneurysm (AAA). Diabetes Have regular diabetes screenings. This checks your fasting blood sugar level. Have the screening done: Once every three years after age 45 if you are at a normal weight and have a low risk for diabetes. More often and at a younger age if you are overweight or have a high risk for diabetes. What should I know about preventing infection? Hepatitis B If you have a higher risk for hepatitis B, you should be screened for this virus. Talk with your health care provider to find out if you are at risk for hepatitis B infection. Hepatitis C Blood testing is recommended for: Everyone born from 1945 through 1965. Anyone with known risk factors for hepatitis C. Sexually transmitted infections (STIs) You should be screened each year for STIs, including gonorrhea and chlamydia, if: You are sexually active and are younger than 49 years of age. You are older than 49 years of age and your   health care provider tells you that you are at risk for this type of infection. Your sexual activity has changed since you were last screened, and you are at increased risk for chlamydia or gonorrhea. Ask your health care provider if you are at risk. Ask your health care provider about whether you are at high risk for HIV. Your health care provider  may recommend a prescription medicine to help prevent HIV infection. If you choose to take medicine to prevent HIV, you should first get tested for HIV. You should then be tested every 3 months for as long as you are taking the medicine. Follow these instructions at home: Alcohol use Do not drink alcohol if your health care provider tells you not to drink. If you drink alcohol: Limit how much you have to 0-2 drinks a day. Know how much alcohol is in your drink. In the [place] one drink equals one 12 oz bottle of beer (355 mL), one 5 oz glass of wine (148 mL), or one 1 oz glass of hard liquor (44 mL). Lifestyle Do not use any products that contain nicotine or tobacco. These products include cigarettes, chewing tobacco, and vaping devices, such as e-cigarettes. If you need help quitting, ask your health care provider. Do not use street drugs. Do not share needles. Ask your health care provider for help if you need support or information about quitting drugs. General instructions Schedule regular health, dental, and eye exams. Stay current with your vaccines. Tell your health care provider if: You often feel depressed. You have ever been abused or do not feel safe at home. Summary Adopting a healthy lifestyle and getting preventive care are important in promoting health and wellness. Follow your health care provider's instructions about healthy diet, exercising, and getting tested or screened for diseases. Follow your health care provider's instructions on monitoring your cholesterol and blood pressure. This information is not intended to replace advice given to you by your health care provider. Make sure you discuss any questions you have with your health care provider. Document Revised: 02/04/2021 Document Reviewed: 02/04/2021 Elsevier Patient Education  2024 [employer]  

## 2023-03-06 LAB — BMP8+EGFR
BUN/Creatinine Ratio: 11 (ref 9–20)
BUN: 17 mg/dL (ref 6–24)
CO2: 27 mmol/L (ref 20–29)
Calcium: 10.2 mg/dL (ref 8.7–10.2)
Chloride: 98 mmol/L (ref 96–106)
Creatinine, Ser: 1.5 mg/dL — ABNORMAL HIGH (ref 0.76–1.27)
Glucose: 89 mg/dL (ref 70–99)
Potassium: 4.3 mmol/L (ref 3.5–5.2)
Sodium: 141 mmol/L (ref 134–144)
eGFR: 57 mL/min/{1.73_m2} — ABNORMAL LOW (ref >59)

## 2023-03-06 LAB — FRUCTOSAMINE: Fructosamine: 248 umol/L (ref 0–285)

## 2023-03-06 LAB — LIPID PANEL
Chol/HDL Ratio: 2.8 ratio (ref 0.0–5.0)
Cholesterol, Total: 128 mg/dL (ref 100–199)
HDL: 45 mg/dL (ref >39)
LDL Chol Calc (NIH): 57 mg/dL (ref 0–99)
Triglycerides: 152 mg/dL — ABNORMAL HIGH (ref 0–149)
VLDL Cholesterol Cal: 26 mg/dL (ref 5–40)

## 2023-03-06 LAB — PSA: Prostate Specific Ag, Serum: 1.1 ng/mL (ref 0.0–4.0)

## 2023-03-11 ENCOUNTER — Ambulatory Visit: Payer: Medicare Other

## 2023-03-11 ENCOUNTER — Ambulatory Visit (INDEPENDENT_AMBULATORY_CARE_PROVIDER_SITE_OTHER): Payer: 59

## 2023-03-11 VITALS — Ht 68.0 in | Wt 210.0 lb

## 2023-03-11 DIAGNOSIS — Z0000 Encounter for general adult medical examination without abnormal findings: Secondary | ICD-10-CM

## 2023-03-11 NOTE — Patient Instructions (Signed)
Nicolas [name] , Thank you for taking time to come for your Medicare Wellness Visit. I appreciate your ongoing commitment to your health goals. Please review the following plan we discussed and let me know if I can assist you in the future.   These are the goals we discussed:  Goals      Patient Stated     To stay healthy     Patient Stated     02/08/2020, wants to get out the house more     Patient Stated     02/20/2021, stay healthy     Patient Stated     02/26/2022, no goals     Patient Stated     03/11/2023, no goals        This is a list of the screening recommended for you and due dates:  Health Maintenance  Topic Date Due   COVID-19 Vaccine (4 - 2023-24 season) 05/30/2022   Flu Shot  04/30/2023   Hemoglobin A1C  08/19/2023   Yearly kidney health urinalysis for diabetes  02/16/2024   Complete foot exam   02/16/2024   Eye exam for diabetics  02/19/2024   Yearly kidney function blood test for diabetes  03/04/2024   Medicare Annual Wellness Visit  03/10/2024   Colon Cancer Screening  01/22/2032   DTaP/Tdap/Td vaccine (2 - Td or Tdap) 02/15/2033   Hepatitis C Screening  Completed   HIV Screening  Completed   HPV Vaccine  Aged Out    Advanced directives: Advance directive discussed with you today.   Conditions/risks identified: none  Next appointment: Follow up in one year for your annual wellness visit   Preventive Care 40-64 Years, Male Preventive care refers to lifestyle choices and visits with your health care provider that can promote health and wellness. What does preventive care include? A yearly physical exam. This is also called an annual well check. Dental exams once or twice a year. Routine eye exams. Ask your health care provider how often you should have your eyes checked. Personal lifestyle choices, including: Daily care of your teeth and gums. Regular physical activity. Eating a healthy diet. Avoiding tobacco and drug use. Limiting alcohol  use. Practicing safe sex. Taking low-dose aspirin every day starting at age 46. What happens during an annual well check? The services and screenings done by your health care provider during your annual well check will depend on your age, overall health, lifestyle risk factors, and family history of disease. Counseling  Your health care provider may ask you questions about your: Alcohol use. Tobacco use. Drug use. Emotional well-being. Home and relationship well-being. Sexual activity. Eating habits. Work and work Astron[employer]. Screening  You may have the following tests or measurements: Height, weight, and BMI. Blood pressure. Lipid and cholesterol levels. These may be checked every 5 years, or more frequently if you are over 39 years old. Skin check. Lung cancer screening. You may have this screening every year starting at age 32 if you have a 30-pack-year history of smoking and currently smoke or have quit within the past 15 years. Fecal occult blood test (FOBT) of the stool. You may have this test every year starting at age 83. Flexible sigmoidoscopy or colonoscopy. You may have a sigmoidoscopy every 5 years or a colonoscopy every 10 years starting at age 72. Prostate cancer screening. Recommendations will vary depending on your family history and other risks. Hepatitis C blood test. Hepatitis B blood test. Sexually transmitted disease (STD) testing. Diabetes screening. This  is done by checking your blood sugar (glucose) after you have not eaten for a while (fasting). You may have this done every 1-3 years. Discuss your test results, treatment options, and if necessary, the need for more tests with your health care provider. Vaccines  Your health care provider may recommend certain vaccines, such as: Influenza vaccine. This is recommended every year. Tetanus, diphtheria, and acellular pertussis (Tdap, Td) vaccine. You may need a Td booster every 10 years. Zoster vaccine. You may  need this after age 56. Pneumococcal 13-valent conjugate (PCV13) vaccine. You may need this if you have certain conditions and have not been vaccinated. Pneumococcal polysaccharide (PPSV23) vaccine. You may need one or two doses if you smoke cigarettes or if you have certain conditions. Talk to your health care provider about which screenings and vaccines you need and how often you need them. This information is not intended to replace advice given to you by your health care provider. Make sure you discuss any questions you have with your health care provider. Document Released: 10/12/2015 Document Revised: 06/04/2016 Document Reviewed: 07/17/2015 Elsevier Interactive Patient Education  2017 [employer].  Fall Prevention in the Home Falls can cause injuries. They can happen to people of all ages. There are many things you can do to make your home safe and to help prevent falls. What can I do on the outside of my home? Regularly fix the edges of walkways and driveways and fix any cracks. Remove anything that might make you trip as you walk through a door, such as a raised step or threshold. Trim any bushes or trees on the path to your home. Use bright outdoor lighting. Clear any walking paths of anything that might make someone trip, such as rocks or tools. Regularly check to see if handrails are loose or broken. Make sure that both sides of any steps have handrails. Any raised decks and porches should have guardrails on the edges. Have any leaves, snow, or ice cleared regularly. Use sand or salt on walking paths during winter. Clean up any spills in your garage right away. This includes oil or grease spills. What can I do in the bathroom? Use night lights. Install grab bars by the toilet and in the tub and shower. Do not use towel bars as grab bars. Use non-skid mats or decals in the tub or shower. If you need to sit down in the shower, use a plastic, non-slip stool. Keep the floor dry.  Clean up any water that spills on the floor as soon as it happens. Remove soap buildup in the tub or shower regularly. Attach bath mats securely with double-sided non-slip rug tape. Do not have throw rugs and other things on the floor that can make you trip. What can I do in the bedroom? Use night lights. Make sure that you have a light by your bed that is easy to reach. Do not use any sheets or blankets that are too big for your bed. They should not hang down onto the floor. Have a firm chair that has side arms. You can use this for support while you get dressed. Do not have throw rugs and other things on the floor that can make you trip. What can I do in the kitchen? Clean up any spills right away. Avoid walking on wet floors. Keep items that you use a lot in easy-to-reach places. If you need to reach something above you, use a strong step stool that has a grab bar.  Keep electrical cords out of the way. Do not use floor polish or wax that makes floors slippery. If you must use wax, use non-skid floor wax. Do not have throw rugs and other things on the floor that can make you trip. What can I do with my stairs? Do not leave any items on the stairs. Make sure that there are handrails on both sides of the stairs and use them. Fix handrails that are broken or loose. Make sure that handrails are as long as the stairways. Check any carpeting to make sure that it is firmly attached to the stairs. Fix any carpet that is loose or worn. Avoid having throw rugs at the top or bottom of the stairs. If you do have throw rugs, attach them to the floor with carpet tape. Make sure that you have a light switch at the top of the stairs and the bottom of the stairs. If you do not have them, ask someone to add them for you. What else can I do to help prevent falls? Wear shoes that: Do not have high heels. Have rubber bottoms. Are comfortable and fit you well. Are closed at the toe. Do not wear sandals. If  you use a stepladder: Make sure that it is fully opened. Do not climb a closed stepladder. Make sure that both sides of the stepladder are locked into place. Ask someone to hold it for you, if possible. Clearly mark and make sure that you can see: Any grab bars or handrails. First and last steps. Where the edge of each step is. Use tools that help you move around (mobility aids) if they are needed. These include: Canes. Walkers. Scooters. Crutches. Turn on the lights when you go into a dark area. Replace any light bulbs as soon as they burn out. Set up your furniture so you have a clear path. Avoid moving your furniture around. If any of your floors are uneven, fix them. If there are any pets around you, be aware of where they are. Review your medicines with your doctor. Some medicines can make you feel dizzy. This can increase your chance of falling. Ask your doctor what other things that you can do to help prevent falls. This information is not intended to replace advice given to you by your health care provider. Make sure you discuss any questions you have with your health care provider. Document Released: 07/12/2009 Document Revised: 02/21/2016 Document Reviewed: 10/20/2014 Elsevier Interactive Patient Education  2017 [employer].

## 2023-03-11 NOTE — Progress Notes (Signed)
I connected with  Nicolas [name] on 03/11/23 by a audio enabled telemedicine application and verified that I am speaking with the correct person using two identifiers.  Patient Location: Home  Provider Location: Office/Clinic  I discussed the limitations of evaluation and management by telemedicine. The patient expressed understanding and agreed to proceed. Subjective:   Nicolas [name] is a 47 y.o. male who presents for Medicare Annual/Subsequent preventive examination.  Review of Systems     Cardiac Risk Factors include: advanced age (>25men, >16 women);diabetes mellitus;hypertension;male gender;obesity (BMI >30kg/m2)     Objective:    Today's Vitals   03/11/23 1106 03/11/23 1107  Weight: 210 lb (95.3 kg)   Height: 5\' 8"  (1.727 m)   PainSc:  7    Body mass index is 31.93 kg/m.     03/11/2023   11:11 AM 02/26/2023    9:29 AM 02/25/2023    3:29 PM 10/18/2022    7:00 AM 09/27/2022    2:28 PM 09/25/2022    2:01 PM 02/26/2022    2:44 PM  Advanced Directives  Does Patient Have a Medical Advance Directive? No No No No No No Yes  Type of Advance Directive       Living will  Would patient like information on creating a medical advance directive?  No - Patient declined Yes (MAU/Ambulatory/Procedural Areas - Information given) No - Patient declined No - Patient declined      Current Medications (verified) Outpatient Encounter Medications as of 03/11/2023  Medication Sig   amLODipine (NORVASC) 10 MG tablet TAKE 1 TABLET BY MOUTH EVERY DAY (Patient taking differently: Take 10 mg by mouth daily.)   bacitracin-polymyxin b (POLYSPORIN) ophthalmic ointment Place into the left eye 4 (four) times daily for 10 days. Place a 1/2 inch ribbon of ointment into the lower eyelid.   carvedilol (COREG) 6.25 MG tablet TAKE 1 TABLET BY MOUTH TWICE A DAY WITH MEALS (Patient taking differently: Take 6.25 mg by mouth 2 (two) times daily with a meal.)   Cholecalciferol (VITAMIN D3) 2000 units capsule Take  2,000 Units by mouth daily.   folic acid (FOLVITE) 1 MG tablet Take 1 tablet (1 mg total) by mouth daily. (Patient taking differently: Take 1 mg by mouth 2 (two) times daily.)   glucose blood test strip Use as instructed (Patient taking differently: 1 each by Other route every other day. Use as instructed)   HYDROcodone-acetaminophen (NORCO/VICODIN) 5-325 MG tablet Take 1 tablet by mouth every 4 (four) hours as needed for moderate pain.   Lancets (ACCU-CHEK MULTICLIX) lancets Use as instructed (Patient taking differently: 1 each by Other route every other day.)   metFORMIN (GLUCOPHAGE) 500 MG tablet TAKE 1 TABLET BY MOUTH TWICE A DAY (Patient taking differently: Take 500 mg by mouth 2 (two) times daily with a meal.)   Multiple Vitamin (ONE-A-DAY MENS PO) Take 1 tablet by mouth daily with breakfast.   OXBRYTA 500 MG TABS tablet Take 1,500 mg by mouth daily.   oxyCODONE-acetaminophen (PERCOCET) 7.5-325 MG tablet Take 1 tablet by mouth every 4 (four) hours as needed for severe pain.   OZEMPIC, 0.25 OR 0.5 MG/DOSE, 2 MG/1.5ML SOPN INJECT 0.5 MG INTO THE SKIN ONCE A WEEK. (Patient taking differently: Inject 0.25 mg into the skin every Friday.)   pravastatin (PRAVACHOL) 20 MG tablet TAKE 1 TABLET BY MOUTH EVERY DAY IN THE EVENING   prednisoLONE acetate (PRED FORTE) 1 % ophthalmic suspension Place 1 drop into the left eye 4 (four) times daily.  triamterene-hydrochlorothiazide (MAXZIDE-25) 37.5-25 MG tablet TAKE 1 TABLET BY MOUTH EVERY DAY   valsartan (DIOVAN) 160 MG tablet Take 1 tablet (160 mg total) by mouth daily.   doxycycline (MONODOX) 100 MG capsule Take 1 capsule (100 mg total) by mouth 2 (two) times daily. (Patient not taking: Reported on 03/11/2023)   No facility-administered encounter medications on file as of 03/11/2023.    Allergies (verified) Patient has no known allergies.   History: Past Medical History:  Diagnosis Date   Arthritis    arthritis- back & L hip   Depression     situational depression, pt. out of work    Diabetes mellitus (HCC)    HTN (hypertension) 05/10/2015   Osteoarthritis of left hip 06/07/2012   Sickle cell anemia [facility])    Past Surgical History:  Procedure Laterality Date   IR GENERIC HISTORICAL  09/26/2016   IR FLUORO GUIDE CV LINE RIGHT 09/26/2016 Irish Lack, MD [facility]INTER[facility]   IR GENERIC HISTORICAL  09/26/2016   IR US GUIDE VASC ACCESS RIGHT 09/26/2016 Irish Lack, MD [facility]   JOINT REPLACEMENT Right 2012   R hip   PHOTOCOAGULATION WITH LASER Right 02/26/2023   Procedure: PHOTOCOAGULATION WITH LASER;  Surgeon: [name], MD;  Location: [facility] OR;  Service: Ophthalmology;  Laterality: Right;   TOTAL HIP ARTHROPLASTY  06/07/2012   Procedure: TOTAL HIP ARTHROPLASTY;  Surgeon: Eulas Post, MD;  Location: [facility] OR;  Service: Orthopedics;  Laterality: Left;   VITRECTOMY 25 GAUGE WITH SCLERAL BUCKLE Left 02/26/2023   Procedure: VITRECTOMY 25 GAUGE WITH SCLERAL BUCKLE;  Surgeon: [name], MD;  Location: [facility] OR;  Service: Ophthalmology;  Laterality: Left;   Family History  Problem Relation Age of Onset   Diabetes Mother    Cancer - Other Father    Liver cancer Father    CAD Neg Hx    Social History   Socioeconomic History   Marital status: Married    Spouse name: Afiwavi   Number of children: 2   Years of education: Not on file   Highest education level: Not on file  Occupational History   Occupation: Disabled  Tobacco Use   Smoking status: Never   Smokeless tobacco: Never  Vaping Use   Vaping Use: Never used  Substance and Sexual Activity   Alcohol use: Not Currently    Comment: ocassional    Drug use: Never   Sexual activity: Yes  Other Topics Concern   Not on file  Social History Narrative   Not on file   Social Determinants of Health   Financial Resource Strain: Low Risk  (03/11/2023)   Overall Financial Resource Strain (CARDIA)    Difficulty of Paying Living Expenses: Not hard at all  Food  Insecurity: No Food Insecurity (03/11/2023)   Hunger Vital Sign    Worried About Running Out of Food in the Last Year: Never true    Ran Out of Food in the Last Year: Never true  Transportation Needs: No Transportation Needs (03/11/2023)   PRAPARE - Administrator, Civil Service (Medical): No    Lack of Transportation (Non-Medical): No  Physical Activity: Sufficiently Active (03/11/2023)   Exercise Vital Sign    Days of Exercise per Week: 3 days    Minutes of Exercise per Session: 60 min  Stress: No Stress Concern Present (03/11/2023)   [employer] of Occupational Health - Occupational Stress Questionnaire    Feeling of Stress : Not at all  Social Connections: Not  on file    Tobacco Counseling Counseling given: Not Answered   Clinical Intake:  Pre-visit preparation completed: Yes  Pain : 0-10 Pain Score: 7  Pain Type: Chronic pain Pain Location: Back Pain Orientation: Lower Pain Descriptors / Indicators: Aching Pain Onset: More than a month ago Pain Frequency: Intermittent     Nutritional Status: BMI > 30  Obese Nutritional Risks: None Diabetes: Yes  How often do you need to have someone help you when you read instructions, pamphlets, or other written materials from your doctor or pharmacy?: 1 - Never  Diabetic? Yes Nutrition Risk Assessment:  Has the patient had any N/V/D within the last 2 months?  No  Does the patient have any non-healing wounds?  No  Has the patient had any unintentional weight loss or weight gain?  No   Diabetes:  Is the patient diabetic?  Yes  If diabetic, was a CBG obtained today?  No  Did the patient bring in their glucometer from home?  No  How often do you monitor your CBG's? Once weekly.   Financial Strains and Diabetes Management:  Are you having any financial strains with the device, your supplies or your medication? No .  Does the patient want to be seen by Chronic Care Management for management of their diabetes?   No  Would the patient like to be referred to a Nutritionist or for Diabetic Management?  No   Diabetic Exams:  Diabetic Eye Exam: Completed 02/19/2023 Diabetic Foot Exam: Completed 02/16/2023  Interpreter Needed?: No  Information entered by :: NAllen LPN   Activities of Daily Living    03/11/2023   11:11 AM 02/25/2023    3:33 PM  In your present state of health, do you have any difficulty performing the following activities:  Hearing? 0   Vision? 1   Comment blurry due to surgery   Difficulty concentrating or making decisions? 0   Walking or climbing stairs? 0   Dressing or bathing? 0   Doing errands, shopping? 0 0  Preparing Food and eating ? N   Using the Toilet? N   In the past six months, have you accidently leaked urine? N   Do you have problems with loss of bowel control? N   Managing your Medications? N   Managing your Finances? N   Housekeeping or managing your Housekeeping? N     Patient Care Team: Dorothyann Peng, MD as PCP - General (Internal Medicine) Aura Camps, MD as Consulting Physician (Ophthalmology)  Indicate any recent Medical Services you may have received from other than [facility] providers in the past year (date may be approximate).     Assessment:   This is a routine wellness examination for [facility].  Hearing/Vision screen Vision Screening - Comments:: Regular eye exams, Dr. Karleen Hampshire  Dietary issues and exercise activities discussed: Current Exercise Habits: Home exercise routine, Type of exercise: walking, Time (Minutes): 60, Frequency (Times/Week): 3, Weekly Exercise (Minutes/Week): 180   Goals Addressed             This Visit's Progress    Patient Stated       03/11/2023, no goals       Depression Screen    03/11/2023   11:11 AM 02/16/2023   11:03 AM 10/23/2022    4:20 PM 02/26/2022    2:45 PM 02/22/2022    3:17 PM 02/20/2021    3:01 PM 02/13/2021   10:09 AM  PHQ 2/9 Scores  PHQ - 2 Score 0 0  0 0 0 0 0    Fall Risk    03/11/2023    11:11 AM 02/16/2023   11:02 AM 10/23/2022    4:20 PM 06/10/2022    8:52 AM 02/26/2022    2:45 PM  Fall Risk   Falls in the past year? 0 0 0 0 0  Number falls in past yr: 0 0 0 0 0  Injury with Fall? 0 0 0 0 0  Risk for fall due to : Medication side effect No Fall Risks No Fall Risks No Fall Risks Medication side effect  Follow up Falls prevention discussed;Education provided;Falls evaluation completed Falls evaluation completed Falls evaluation completed Falls evaluation completed Falls evaluation completed;Education provided;Falls prevention discussed    FALL RISK PREVENTION PERTAINING TO THE HOME:  Any stairs in or around the home? No  If so, are there any without handrails? N/a Home free of loose throw rugs in walkways, pet beds, electrical cords, etc? Yes  Adequate lighting in your home to reduce risk of falls? Yes   ASSISTIVE DEVICES UTILIZED TO PREVENT FALLS:  Life alert? No  Use of a cane, walker or w/c? No  Grab bars in the bathroom? Yes  Shower chair or bench in shower? No  Elevated toilet seat or a handicapped toilet? Yes   TIMED UP AND GO:  Was the test performed? No .      Cognitive Function:        03/11/2023   11:13 AM 02/26/2022    2:47 PM 02/20/2021    3:03 PM 02/08/2020   11:52 AM 02/01/2019   12:12 PM  6CIT Screen  What Year? 0 points 0 points 0 points 0 points 0 points  What month? 0 points 0 points 0 points 0 points 0 points  What time? 0 points 0 points 0 points 0 points 0 points  Count back from 20 0 points 0 points 0 points 0 points 0 points  Months in reverse 0 points 0 points 0 points 0 points 0 points  Repeat phrase 0 points 4 points 4 points 2 points 0 points  Total Score 0 points 4 points 4 points 2 points 0 points    Immunizations Immunization History  Administered Date(s) Administered   HIB (PRP-T) 02/08/2015   Hepatitis B, ADULT 08/20/2015, 09/17/2015, 02/18/2016   Meningococcal B Recombinant 08/06/2020, 09/12/2020   Meningococcal  Conjugate 11/09/2014, 02/20/2020, 02/20/2020   PFIZER(Purple Top)SARS-COV-2 Vaccination 01/07/2020, 01/30/2020, 10/16/2020   Pneumococcal Conjugate-13 11/09/2014   Pneumococcal Polysaccharide-23 02/08/2015, 02/20/2020   Tdap 02/16/2023    TDAP status: Up to date  Flu Vaccine status: Declined, Education has been provided regarding the importance of this vaccine but patient still declined. Advised may receive this vaccine at local pharmacy or Health Dept. Aware to provide a copy of the vaccination record if obtained from local pharmacy or Health Dept. Verbalized acceptance and understanding.  Pneumococcal vaccine status: Up to date  Covid-19 vaccine status: Completed vaccines  Qualifies for Shingles Vaccine? No   Zostavax completed  n/a   Shingrix Completed?: n/a  Screening Tests Health Maintenance  Topic Date Due   COVID-19 Vaccine (4 - 2023-24 season) 05/30/2022   Medicare Annual Wellness (AWV)  02/27/2023   INFLUENZA VACCINE  04/30/2023   HEMOGLOBIN A1C  08/19/2023   Diabetic kidney evaluation - Urine ACR  02/16/2024   FOOT EXAM  02/16/2024   OPHTHALMOLOGY EXAM  02/19/2024   Diabetic kidney evaluation - eGFR measurement  03/04/2024   Colonoscopy  01/22/2032  DTaP/Tdap/Td (2 - Td or Tdap) 02/15/2033   Hepatitis C Screening  Completed   HIV Screening  Completed   HPV VACCINES  Aged Out    Health Maintenance  Health Maintenance Due  Topic Date Due   COVID-19 Vaccine (4 - 2023-24 season) 05/30/2022   Medicare Annual Wellness (AWV)  02/27/2023    Colorectal cancer screening: Type of screening: Colonoscopy. Completed 01/21/2022. Repeat every 10 years  Lung Cancer Screening: (Low Dose CT Chest recommended if Age 38-80 years, 30 pack-year currently smoking OR have quit w/in 15years.) does not qualify.   Lung Cancer Screening Referral: no  Additional Screening:  Hepatitis C Screening: does qualify; Completed 02/06/2021  Vision Screening: Recommended annual ophthalmology  exams for early detection of glaucoma and other disorders of the eye. Is the patient up to date with their annual eye exam?  Yes  Who is the provider or what is the name of the office in which the patient attends annual eye exams? Dr. Karleen Hampshire If pt is not established with a provider, would they like to be referred to a provider to establish care? No .   Dental Screening: Recommended annual dental exams for proper oral hygiene  Community Resource Referral / Chronic Care Management: CRR required this visit?  No   CCM required this visit?  No      Plan:     I have personally reviewed and noted the following in the patient's chart:   Medical and social history Use of alcohol, tobacco or illicit drugs  Current medications and supplements including opioid prescriptions. Patient is currently taking opioid prescriptions. Information provided to patient regarding non-opioid alternatives. Patient advised to discuss non-opioid treatment plan with their provider. Functional ability and status Nutritional status Physical activity Advanced directives List of other physicians Hospitalizations, surgeries, and ER visits in previous 12 months Vitals Screenings to include cognitive, depression, and falls Referrals and appointments  In addition, I have reviewed and discussed with patient certain preventive protocols, quality metrics, and best practice recommendations. A written personalized care plan for preventive services as well as general preventive health recommendations were provided to patient.     Barb Merino, LPN   [id number]/61/0960   Nurse Notes: none  Due to this being a virtual visit, the after visit summary with patients personalized plan was offered to patient via mail or my-chart. to pick up at office at next visit

## 2023-03-15 DIAGNOSIS — I129 Hypertensive chronic kidney disease with stage 1 through stage 4 chronic kidney disease, or unspecified chronic kidney disease: Secondary | ICD-10-CM | POA: Insufficient documentation

## 2023-03-15 DIAGNOSIS — H3322 Serous retinal detachment, left eye: Secondary | ICD-10-CM | POA: Insufficient documentation

## 2023-03-19 NOTE — Progress Notes (Signed)
[facility] - Clinic Note  03/25/2023   CHIEF COMPLAINT Patient presents for Retina Follow Up  HISTORY OF PRESENT ILLNESS: Nicolas [name] is a 47 y.o. male who presents to the clinic today for:  HPI     Retina Follow Up   Patient presents with  Retinal Break/Detachment.  In left eye.  This started 4 weeks ago.  Duration of 4 weeks.  Since onset it is stable.        Comments   4 week retina follow up RD OS pt is reporting vision maybe little improved he has had some flashes and floaters       Last edited by Etheleen Mayhew, COT on 03/25/2023  9:45 AM.     Patient states that he has backed off on the face down position. He states that he can see the bubble.   Referring physician: Dorothyann Peng, MD [address] [place],  [place] [address]  HISTORICAL INFORMATION:  Selected notes from the MEDICAL RECORD NUMBER Referred by Dr. Karleen Hampshire for TRD OS LEE:  Ocular Hx- PMH-   CURRENT MEDICATIONS: Current Outpatient Medications (Ophthalmic Drugs)  Medication Sig   prednisoLONE acetate (PRED FORTE) 1 % ophthalmic suspension Place 1 drop into the left eye 4 (four) times daily.   No current facility-administered medications for this visit. (Ophthalmic Drugs)   Current Outpatient Medications (Other)  Medication Sig   amLODipine (NORVASC) 10 MG tablet TAKE 1 TABLET BY MOUTH EVERY DAY (Patient taking differently: Take 10 mg by mouth daily.)   carvedilol (COREG) 6.25 MG tablet TAKE 1 TABLET BY MOUTH TWICE A DAY WITH MEALS (Patient taking differently: Take 6.25 mg by mouth 2 (two) times daily with a meal.)   Cholecalciferol (VITAMIN D3) 2000 units capsule Take 2,000 Units by mouth daily.   doxycycline (MONODOX) 100 MG capsule Take 1 capsule (100 mg total) by mouth 2 (two) times daily. (Patient not taking: Reported on 03/11/2023)   folic acid (FOLVITE) 1 MG tablet Take 1 tablet (1 mg total) by mouth daily. (Patient taking differently: Take 1 mg by mouth  2 (two) times daily.)   glucose blood test strip Use as instructed (Patient taking differently: 1 each by Other route every other day. Use as instructed)   HYDROcodone-acetaminophen (NORCO/VICODIN) 5-325 MG tablet Take 1 tablet by mouth every 4 (four) hours as needed for moderate pain.   Lancets (ACCU-CHEK MULTICLIX) lancets Use as instructed (Patient taking differently: 1 each by Other route every other day.)   metFORMIN (GLUCOPHAGE) 500 MG tablet TAKE 1 TABLET BY MOUTH TWICE A DAY (Patient taking differently: Take 500 mg by mouth 2 (two) times daily with a meal.)   Multiple Vitamin (ONE-A-DAY MENS PO) Take 1 tablet by mouth daily with breakfast.   OXBRYTA 500 MG TABS tablet Take 1,500 mg by mouth daily.   oxyCODONE-acetaminophen (PERCOCET) 7.5-325 MG tablet Take 1 tablet by mouth every 4 (four) hours as needed for severe pain.   OZEMPIC, 0.25 OR 0.5 MG/DOSE, 2 MG/1.5ML SOPN INJECT 0.5 MG INTO THE SKIN ONCE A WEEK. (Patient taking differently: Inject 0.25 mg into the skin every Friday.)   pravastatin (PRAVACHOL) 20 MG tablet TAKE 1 TABLET BY MOUTH EVERY DAY IN THE EVENING   triamterene-hydrochlorothiazide (MAXZIDE-25) 37.5-25 MG tablet TAKE 1 TABLET BY MOUTH EVERY DAY   valsartan (DIOVAN) 160 MG tablet Take 1 tablet (160 mg total) by mouth daily.   No current facility-administered medications for this visit. (Other)   REVIEW  OF SYSTEMS: ROS   Positive for: Endocrine, Cardiovascular, Eyes Negative for: Constitutional, Gastrointestinal, Neurological, Skin, Genitourinary, Musculoskeletal, HENT, Respiratory, Psychiatric, Allergic/Imm, Heme/Lymph Last edited by Etheleen Mayhew, COT on 03/25/2023  9:45 AM.      ALLERGIES No Known Allergies PAST MEDICAL HISTORY Past Medical History:  Diagnosis Date   Arthritis    arthritis- back & L hip   Depression    situational depression, pt. out of work    Diabetes mellitus (HCC)    HTN (hypertension) 05/10/2015   Osteoarthritis of left hip  06/07/2012   Sickle cell anemia (HCC)    Past Surgical History:  Procedure Laterality Date   IR GENERIC HISTORICAL  09/26/2016   IR FLUORO GUIDE CV LINE RIGHT 09/26/2016 Irish Lack, MD [facility]   IR GENERIC HISTORICAL  09/26/2016   IR US GUIDE VASC ACCESS RIGHT 09/26/2016 Irish Lack, MD [facility]   JOINT REPLACEMENT Right 2012   R hip   PHOTOCOAGULATION WITH LASER Right 02/26/2023   Procedure: PHOTOCOAGULATION WITH LASER;  Surgeon: [name], MD;  Location: [facility] OR;  Service: Ophthalmology;  Laterality: Right;   TOTAL HIP ARTHROPLASTY  06/07/2012   Procedure: TOTAL HIP ARTHROPLASTY;  Surgeon: Eulas Post, MD;  Location: [facility] OR;  Service: Orthopedics;  Laterality: Left;   VITRECTOMY 25 GAUGE WITH SCLERAL BUCKLE Left 02/26/2023   Procedure: VITRECTOMY 25 GAUGE WITH SCLERAL BUCKLE;  Surgeon: [name], MD;  Location: [facility] OR;  Service: Ophthalmology;  Laterality: Left;   FAMILY HISTORY Family History  Problem Relation Age of Onset   Diabetes Mother    Cancer - Other Father    Liver cancer Father    CAD Neg Hx    SOCIAL HISTORY Social History   Tobacco Use   Smoking status: Never   Smokeless tobacco: Never  Vaping Use   Vaping Use: Never used  Substance Use Topics   Alcohol use: Not Currently    Comment: ocassional    Drug use: Never       OPHTHALMIC EXAM:  Base Eye Exam     Visual Acuity (Snellen - Linear)       Right Left   Dist cc 20/25 -1 20/80 -2   Dist ph cc NI NI         Tonometry (Tonopen, 9:49 AM)       Right Left   Pressure 18 18         Pupils       Pupils Dark Light Shape React APD   Right PERRL 3 2 Round Brisk None   Left PERRL 5 5 Round           Visual Fields       Left Right     Full   Restrictions Total superior temporal, inferior temporal, inferior nasal deficiencies; Partial inner superior nasal deficiency          Neuro/Psych     Oriented x3: Yes   Mood/Affect: Normal         Dilation      Both eyes: 2.5% Phenylephrine @ 9:51 AM           Slit Lamp and Fundus Exam     Slit Lamp Exam       Right Left   Lids/Lashes Dermatochalasis - upper lid Dermatochalasis - upper lid   Conjunctiva/Sclera 2+ Injection, Melanosis 1+ Injection, Melanosis, Subconjunctival hemorrhage, sutures intact.   Cornea Clear Central epi defect - closed   Anterior Chamber deep and clear  Deep, early fibrin reaction- resolved, .5+cell, .5+pigment   Iris Round and dilated, No NVI dilated, No NVI, focal PS 08:30   Lens 2+ Nuclear sclerosis, 2+ Cortical cataract 2+ Nuclear sclerosis, 2+ Cortical cataract, 2-3+ PC feathering   Anterior Vitreous clear Post vitrectomy, good gas fill.         Fundus Exam       Right Left   Disc Pink and Sharp, +PPP Hazy fused, perfused   C/D Ratio 0.2 0.4   Macula Flat, Blunted foveal reflex, RPE mottling, No heme or edema Hazy view, grossly attached under gas.   Vessels attenuated, mild tortuosity, fibrotic NV IT, early NV nasal periphery attenuated, Tortuous, AV crossing changes, fibrotic NVE IT periphery with surrounding heme, fibrotic NVE nasal periphery, ?stretch hole at 0800; hazy view   Periphery Attached, fibrotic NV IT, early NV nasal periphery, good segmental PRP changes surrounding both areas Retina attached over buckle, good buckle height, good laser over buckle. Tractional fibrosis improved. ?Mild heme inferiorly; PRE-OP: total TRD greatest nasal and inferior quads; fibrotic NVE nasal and IT periphery; ?stretch hole at 0800           Refraction     Wearing Rx       Sphere Cylinder Axis Add   Right +1.25 +0.25 086 +2.50   Left +1.25 +0.25 093 +2.50           IMAGING AND PROCEDURES  Imaging and Procedures for 03/25/2023         ASSESSMENT/PLAN:   ICD-10-CM   1. Left retinal detachment  H33.22     2. 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 Sickle cell disease without crisis (HCC)  D57.1      4. Essential hypertension  I10     5. Hypertensive retinopathy of both eyes  H35.033     6. Diabetes mellitus type 2 without retinopathy (HCC)  E11.9     7. Long term (current) use of oral hypoglycemic drugs  Z79.84     8. Long-term (current) use of injectable non-insulin antidiabetic drugs  Z79.85       Retinal detachment, OS - at presentation, pt reported ~1 month history of decreased vision OS - exam showed near total tractional detachment w/ ?rhegmatogenous component -- detachment greatest in nasal and inferior quadrants - focal areas of tractional fibrosis nasal and inferotemporal periphery w/ associated neovascularization  - retinal hole at 0800 - pt with history of sickle cell disease ([place]) -- retinopathy likely etiology of traction and fibrosis - POW1 s/p PPV/PFO/EL/FAX/14% C3F8 OS, 05.30.2024             - doing well  - retina attached and in good position -- good buckle height and laser around breaks             - IOP 18             - begin taper of all drops;  PF OS TID                          Cosopt OS QD                         PSO ung OS at bedtime/PRN   - cont avoid laying flat on back  or looking up             -  post op drop and positioning instructions reviewed              - tylenol/ibuprofen for pain            - f/u 4 weeks, POV  2,3. Proliferative retinopathy from Sickle cell disease OU  - examination and FA show areas of neovascularization OU (OS > OD)  - s/p PRP OD (05.23.24), intra op (05.30.24) - ok to stop PF OD  4,5. Hypertensive retinopathy OU - discussed importance of tight BP control - monitor  6-8. Diabetes mellitus, type 2 without retinopathy  - A1c 4.2 on 05.20.24  - FA (05.23.24) with no significant MA OU - The incidence, risk factors for progression, natural history and treatment options for diabetic retinopathy  were discussed with patient.   - The need for close monitoring of blood glucose, blood pressure, and serum lipids, avoiding  cigarette or any type of tobacco, and the need for long term follow up was also discussed with patient. - f/u in 1 year, sooner prn  9. Mixed Cataract OU - The symptoms of cataract, surgical options, and treatments and risks were discussed with patient. - discussed diagnosis and likely progression post PPV - monitor  Ophthalmic Meds Ordered this visit:  No orders of the defined types were placed in this encounter.    No follow-ups on file.  There are no Patient Instructions on file for this visit.  Explained the diagnoses, plan, and follow up with the patient and they expressed understanding.  Patient expressed understanding of the importance of proper follow up care.   This document serves as a record of services personally performed by Karie Chimera, MD, PhD. It was created on their behalf by De Blanch, an ophthalmic technician. The creation of this record is the provider's dictation and/or activities during the visit.    Electronically signed by: De Blanch, OA, 03/25/23  10:17 AM  This document serves as a record of services personally performed by Karie Chimera, MD, PhD. It was created on their behalf by Gerilyn Nestle, COT an ophthalmic technician. The creation of this record is the provider's dictation and/or activities during the visit.    Electronically signed by:  Charlette Caffey, COT  03/25/23 10:17 AM    Karie Chimera, M.D., Ph.D. Diseases & Surgery of the Retina and Vitreous [facility] 03/25/2023   Abbreviations: M myopia (nearsighted); A astigmatism; H hyperopia (farsighted); P presbyopia; Mrx spectacle prescription;  CTL contact lenses; OD right eye; OS left eye; OU both eyes  XT exotropia; ET esotropia; PEK punctate epithelial keratitis; PEE punctate epithelial erosions; DES dry eye syndrome; MGD meibomian gland dysfunction; ATs artificial tears; PFAT's preservative free artificial tears; NSC nuclear sclerotic  cataract; PSC posterior subcapsular cataract; ERM epi-retinal membrane; PVD posterior vitreous detachment; RD retinal detachment; DM diabetes mellitus; DR diabetic retinopathy; NPDR non-proliferative diabetic retinopathy; PDR proliferative diabetic retinopathy; CSME clinically significant macular edema; DME diabetic macular edema; dbh dot blot hemorrhages; CWS cotton wool spot; POAG primary open angle glaucoma; C/D cup-to-disc ratio; HVF humphrey visual field; GVF goldmann visual field; OCT optical coherence tomography; IOP intraocular pressure; BRVO Branch retinal vein occlusion; CRVO central retinal vein occlusion; CRAO central retinal artery occlusion; BRAO branch retinal artery occlusion; RT retinal tear; SB scleral buckle; PPV pars plana vitrectomy; VH Vitreous hemorrhage; PRP panretinal laser photocoagulation; IVK intravitreal kenalog; VMT vitreomacular traction; MH Macular hole;  NVD neovascularization of the disc; NVE neovascularization elsewhere; AREDS age related eye  disease study; ARMD age related macular degeneration; POAG primary open angle glaucoma; EBMD epithelial/anterior basement membrane dystrophy; ACIOL anterior chamber intraocular lens; IOL intraocular lens; PCIOL posterior chamber intraocular lens; Phaco/IOL phacoemulsification with intraocular lens placement; [place] photorefractive keratectomy; LASIK laser assisted in situ keratomileusis; HTN hypertension; DM diabetes mellitus; COPD chronic obstructive pulmonary disease

## 2023-03-25 ENCOUNTER — Ambulatory Visit (INDEPENDENT_AMBULATORY_CARE_PROVIDER_SITE_OTHER): Payer: 59 | Admitting: Ophthalmology

## 2023-03-25 ENCOUNTER — Encounter (INDEPENDENT_AMBULATORY_CARE_PROVIDER_SITE_OTHER): Payer: Self-pay | Admitting: Ophthalmology

## 2023-03-25 DIAGNOSIS — Z7985 Long-term (current) use of injectable non-insulin antidiabetic drugs: Secondary | ICD-10-CM

## 2023-03-25 DIAGNOSIS — I10 Essential (primary) hypertension: Secondary | ICD-10-CM

## 2023-03-25 DIAGNOSIS — H35033 Hypertensive retinopathy, bilateral: Secondary | ICD-10-CM

## 2023-03-25 DIAGNOSIS — Z7984 Long term (current) use of oral hypoglycemic drugs: Secondary | ICD-10-CM

## 2023-03-25 DIAGNOSIS — D571 Sickle-cell disease without crisis: Secondary | ICD-10-CM

## 2023-03-25 DIAGNOSIS — E119 Type 2 diabetes mellitus without complications: Secondary | ICD-10-CM

## 2023-03-25 DIAGNOSIS — H3322 Serous retinal detachment, left eye: Secondary | ICD-10-CM

## 2023-03-25 MED ORDER — BACITRACIN-POLYMYXIN B 500-10000 UNIT/GM OP OINT: TOPICAL_OINTMENT | Freq: Every evening | OPHTHALMIC | 0 refills | Status: AC

## 2023-03-26 ENCOUNTER — Encounter (INDEPENDENT_AMBULATORY_CARE_PROVIDER_SITE_OTHER): Payer: Self-pay | Admitting: Ophthalmology

## 2023-04-20 NOTE — Progress Notes (Signed)
[facility] - Clinic Note  04/22/2023   CHIEF COMPLAINT Patient presents for Retina Follow Up  HISTORY OF PRESENT ILLNESS: Nicolas [name] is a 47 y.o. male who presents to the clinic today for:  HPI     Retina Follow Up   Patient presents with  Retinal Break/Detachment.  In left eye.  This started 4 weeks ago.  Duration of 4 weeks.  Since onset it is stable.  I, the attending physician,  performed the HPI with the patient and updated documentation appropriately.        Comments   4 week retina follow RD OS pt is reporting vision seems little better he denies any flashes or floaters       Last edited by [name], MD on 04/24/2023 12:53 PM.    Pt states his gas bubble is very small, vision is okay, he is using PF TID OD and Cosopt qdaily OD  Referring physician: Dorothyann Peng, MD [address] [place],  [place] [address]  HISTORICAL INFORMATION:  Selected notes from the MEDICAL RECORD NUMBER Referred by Dr. Karleen Hampshire for TRD OS LEE:  Ocular Hx- PMH-   CURRENT MEDICATIONS: Current Outpatient Medications (Ophthalmic Drugs)  Medication Sig   dorzolamide-timolol (COSOPT) 2-0.5 % ophthalmic solution Place 1 drop into the left eye daily.   prednisoLONE acetate (PRED FORTE) 1 % ophthalmic suspension Place 1 drop into the left eye 3 (three) times daily.   No current facility-administered medications for this visit. (Ophthalmic Drugs)   Current Outpatient Medications (Other)  Medication Sig   amLODipine (NORVASC) 10 MG tablet TAKE 1 TABLET BY MOUTH EVERY DAY (Patient taking differently: Take 10 mg by mouth daily.)   carvedilol (COREG) 6.25 MG tablet TAKE 1 TABLET BY MOUTH TWICE A DAY WITH MEALS (Patient taking differently: Take 6.25 mg by mouth 2 (two) times daily with a meal.)   Cholecalciferol (VITAMIN D3) 2000 units capsule Take 2,000 Units by mouth daily.   doxycycline (MONODOX) 100 MG capsule Take 1 capsule (100 mg total) by mouth 2 (two)  times daily. (Patient not taking: Reported on 03/11/2023)   folic acid (FOLVITE) 1 MG tablet Take 1 tablet (1 mg total) by mouth daily. (Patient taking differently: Take 1 mg by mouth 2 (two) times daily.)   glucose blood test strip Use as instructed (Patient taking differently: 1 each by Other route every other day. Use as instructed)   HYDROcodone-acetaminophen (NORCO/VICODIN) 5-325 MG tablet Take 1 tablet by mouth every 4 (four) hours as needed for moderate pain.   Lancets (ACCU-CHEK MULTICLIX) lancets Use as instructed (Patient taking differently: 1 each by Other route every other day.)   metFORMIN (GLUCOPHAGE) 500 MG tablet TAKE 1 TABLET BY MOUTH TWICE A DAY (Patient taking differently: Take 500 mg by mouth 2 (two) times daily with a meal.)   Multiple Vitamin (ONE-A-DAY MENS PO) Take 1 tablet by mouth daily with breakfast.   OXBRYTA 500 MG TABS tablet Take 1,500 mg by mouth daily.   oxyCODONE-acetaminophen (PERCOCET) 7.5-325 MG tablet Take 1 tablet by mouth every 4 (four) hours as needed for severe pain.   OZEMPIC, 0.25 OR 0.5 MG/DOSE, 2 MG/1.5ML SOPN INJECT 0.5 MG INTO THE SKIN ONCE A WEEK. (Patient taking differently: Inject 0.25 mg into the skin every Friday.)   pravastatin (PRAVACHOL) 20 MG tablet TAKE 1 TABLET BY MOUTH EVERY DAY IN THE EVENING   triamterene-hydrochlorothiazide (MAXZIDE-25) 37.5-25 MG tablet TAKE 1 TABLET BY MOUTH EVERY DAY  valsartan (DIOVAN) 160 MG tablet Take 1 tablet (160 mg total) by mouth daily.   No current facility-administered medications for this visit. (Other)   REVIEW OF SYSTEMS: ROS   Positive for: Endocrine, Cardiovascular, Eyes Negative for: Constitutional, Gastrointestinal, Neurological, Skin, Genitourinary, Musculoskeletal, HENT, Respiratory, Psychiatric, Allergic/Imm, Heme/Lymph Last edited by Etheleen Mayhew, COT on 04/22/2023  9:44 AM.     ALLERGIES No Known Allergies PAST MEDICAL HISTORY Past Medical History:  Diagnosis Date   Arthritis     arthritis- back & L hip   Depression    situational depression, pt. out of work    Diabetes mellitus (HCC)    HTN (hypertension) 05/10/2015   Osteoarthritis of left hip 06/07/2012   Sickle cell anemia (HCC)    Past Surgical History:  Procedure Laterality Date   IR GENERIC HISTORICAL  09/26/2016   IR FLUORO GUIDE CV LINE RIGHT 09/26/2016 Irish Lack, MD [facility]   IR GENERIC HISTORICAL  09/26/2016   IR US GUIDE VASC ACCESS RIGHT 09/26/2016 Irish Lack, MD [facility]   JOINT REPLACEMENT Right 2012   R hip   PHOTOCOAGULATION WITH LASER Right 02/26/2023   Procedure: PHOTOCOAGULATION WITH LASER;  Surgeon: [name], MD;  Location: [facility] OR;  Service: Ophthalmology;  Laterality: Right;   TOTAL HIP ARTHROPLASTY  06/07/2012   Procedure: TOTAL HIP ARTHROPLASTY;  Surgeon: Eulas Post, MD;  Location: [facility] OR;  Service: Orthopedics;  Laterality: Left;   VITRECTOMY 25 GAUGE WITH SCLERAL BUCKLE Left 02/26/2023   Procedure: VITRECTOMY 25 GAUGE WITH SCLERAL BUCKLE;  Surgeon: [name], MD;  Location: [facility] OR;  Service: Ophthalmology;  Laterality: Left;   FAMILY HISTORY Family History  Problem Relation Age of Onset   Diabetes Mother    Cancer - Other Father    Liver cancer Father    CAD Neg Hx    SOCIAL HISTORY Social History   Tobacco Use   Smoking status: Never   Smokeless tobacco: Never  Vaping Use   Vaping status: Never Used  Substance Use Topics   Alcohol use: Not Currently    Comment: ocassional    Drug use: Never       OPHTHALMIC EXAM:  Base Eye Exam     Visual Acuity (Snellen - Linear)       Right Left   Dist cc 20/25 20/150   Dist ph cc NI 20/100 -2         Tonometry (Tonopen, 9:52 AM)       Right Left   Pressure 17 18         Pupils       Pupils Dark Light Shape React APD   Right PERRL 3 2 Round Brisk None   Left PERRL 5 5 Round Minimal None         Visual Fields       Left Right     Full   Restrictions Partial inner superior  temporal deficiency          Extraocular Movement       Right Left    Full Full         Neuro/Psych     Oriented x3: Yes   Mood/Affect: Normal         Dilation     Both eyes: 2.5% Phenylephrine @ 9:52 AM           Slit Lamp and Fundus Exam     Slit Lamp Exam       Right Left  Lids/Lashes Dermatochalasis - upper lid Dermatochalasis - upper lid   Conjunctiva/Sclera 2+ Injection, Melanosis Melanosis, sutures dissolved   Cornea Clear arcus, trace PEE, fine endo pigment   Anterior Chamber deep and clear Deep, 1+fine pigmnent   Iris Round and dilated, No NVI Irregular dilation, PS at 0100 and 1000   Lens 2+ Nuclear sclerosis, 2+ Cortical cataract 2+ Nuclear sclerosis, 2+ Cortical cataract, 2+ PSC   Anterior Vitreous clear Post vitrectomy, 1-2% gas bubble         Fundus Exam       Right Left   Disc Pink and Sharp, +PPP Pink and sharp   C/D Ratio 0.5 0.4   Macula Flat, Blunted foveal reflex, RPE mottling, No heme or edema Flat, Blunted foveal reflex, Drusen, mild fibrosis ST mac   Vessels attenuated, mild tortuosity, copper wiring, fibrotic NV IT, early NV nasal periphery attenuated, Tortuous, copper wiring, fibrotic NVE IT periphery with surrounding heme, fibrotic NVE nasal periphery, ?stretch hole at 0800; hazy view   Periphery Attached, fibrotic NV IT, early NV nasal periphery, good segmental PRP changes surrounding both areas Retina attached over buckle, good buckle height, good laser over buckle. Tractional fibrosis improved. ?Mild heme inferiorly; PRE-OP: total TRD greatest nasal and inferior quads; fibrotic NVE nasal and IT periphery; ?stretch hole at 0800           Refraction     Wearing Rx       Sphere Cylinder Axis Add   Right +1.25 +0.25 086 +2.50   Left +1.25 +0.25 093 +2.50           IMAGING AND PROCEDURES  Imaging and Procedures for 04/22/2023  OCT, Retina - OU - Both Eyes       Right Eye Quality was good. Central Foveal Thickness:  223. Progression has been stable. Findings include normal foveal contour, no IRF, no SRF, vitreomacular adhesion .   Left Eye Quality was good. Central Foveal Thickness: 287. Progression has improved. Findings include normal foveal contour, no IRF, no SRF, myopic contour, epiretinal membrane, macular pucker (Retina re-attached, patchy ORA greatest centrally, stable ERM with pucker, mild IN schisis caught on widefiled).   Notes *Images captured and stored on drive  Diagnosis / Impression:  OD: NFP, no IRF/SRF OS: Retina re-attached, patchy ORA greatest centrally, stable ERM with pucker, mild IN schisis caught on widefiled  Clinical management:  See below  Abbreviations: NFP - Normal foveal profile. CME - cystoid macular edema. PED - pigment epithelial detachment. IRF - intraretinal fluid. SRF - subretinal fluid. EZ - ellipsoid zone. ERM - epiretinal membrane. ORA - outer retinal atrophy. ORT - outer retinal tubulation. SRHM - subretinal hyper-reflective material. IRHM - intraretinal hyper-reflective material           ASSESSMENT/PLAN:   ICD-10-CM   1. Left retinal detachment  H33.22     2. 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 Sickle cell disease without crisis (HCC)  D57.1     4. Essential hypertension  I10     5. Hypertensive retinopathy of both eyes  H35.033     6. Diabetes mellitus type 2 without retinopathy (HCC)  E11.9     7. Long term (current) use of oral hypoglycemic drugs  Z79.84     8. Long-term (current) use of injectable non-insulin antidiabetic drugs  Z79.85      Retinal detachment, OS - at presentation,  pt reported ~1 month history of decreased vision OS - exam showed near total tractional detachment w/ ?rhegmatogenous component -- detachment greatest in nasal and inferior quadrants - focal areas of tractional fibrosis nasal and inferotemporal periphery w/ associated  neovascularization  - retinal hole at 0800 - pt with history of sickle cell disease ([place]) -- retinopathy likely etiology of traction and fibrosis - POW8 s/p PPV/PFO/EL/FAX/14% C3F8 OS, 05.30.2024             - doing well - retina attached and in good position -- good buckle height and laser around breaks - 1-2% gas bubble             - IOP 18  - cont drops: PF OS TID  Cosopt OS qdaily  - cont avoid laying flat on back  or looking up until gas bubble is completely gone             - post op drop and positioning instructions reviewed              - tylenol/ibuprofen for pain            - f/u 3-4 weeks, POV  2,3. Proliferative retinopathy from Sickle cell disease OU  - examination and FA show areas of neovascularization OU (OS > OD)  - s/p PRP OD (05.23.24), intra op (05.30.24)  - good laser changes in place  - stable  4,5. Hypertensive retinopathy OU - discussed importance of tight BP control - monitor  6-8. Diabetes mellitus, type 2 without retinopathy  - A1c 4.2 on 05.20.24  - FA (05.23.24) with no significant MA OU - The incidence, risk factors for progression, natural history and treatment options for diabetic retinopathy  were discussed with patient.   - The need for close monitoring of blood glucose, blood pressure, and serum lipids, avoiding cigarette or any type of tobacco, and the need for long term follow up was also discussed with patient. - f/u in 1 year, sooner prn  9. Mixed Cataract OU - The symptoms of cataract, surgical options, and treatments and risks were discussed with patient. - discussed diagnosis and likely progression post PPV - will refer to [facility]  Ophthalmic Meds Ordered this visit:  Meds ordered this encounter  Medications   prednisoLONE acetate (PRED FORTE) 1 % ophthalmic suspension    Sig: Place 1 drop into the left eye 3 (three) times daily.    Dispense:  15 mL    Refill:  0   dorzolamide-timolol (COSOPT) 2-0.5 % ophthalmic solution     Sig: Place 1 drop into the left eye daily.    Dispense:  10 mL    Refill:  0     Return for f/u 3-4 weeks, RD OS, DFE, OCT.  There are no Patient Instructions on file for this visit.  Explained the diagnoses, plan, and follow up with the patient and they expressed understanding.  Patient expressed understanding of the importance of proper follow up care.   This document serves as a record of services personally performed by Karie Chimera, MD, PhD. It was created on their behalf by De Blanch, an ophthalmic technician. The creation of this record is the provider's dictation and/or activities during the visit.    Electronically signed by: De Blanch, OA, 04/24/23  12:54 PM  This document serves as a record of services personally performed by Karie Chimera, MD, PhD. It was created on their behalf by Glee Arvin. Manson Passey, OA an  ophthalmic technician. The creation of this record is the provider's dictation and/or activities during the visit.    Electronically signed by: Glee Arvin. Manson Passey, OA 04/24/23 12:54 PM  Karie Chimera, M.D., Ph.D. Diseases & Surgery of the Retina and Vitreous [facility] 04/22/2023  I have reviewed the above documentation for accuracy and completeness, and I agree with the above. Karie Chimera, M.D., Ph.D. 04/24/23 12:56 PM   Abbreviations: M myopia (nearsighted); A astigmatism; H hyperopia (farsighted); P presbyopia; Mrx spectacle prescription;  CTL contact lenses; OD right eye; OS left eye; OU both eyes  XT exotropia; ET esotropia; PEK punctate epithelial keratitis; PEE punctate epithelial erosions; DES dry eye syndrome; MGD meibomian gland dysfunction; ATs artificial tears; PFAT's preservative free artificial tears; NSC nuclear sclerotic cataract; PSC posterior subcapsular cataract; ERM epi-retinal membrane; PVD posterior vitreous detachment; RD retinal detachment; DM diabetes mellitus; DR diabetic retinopathy; NPDR non-proliferative  diabetic retinopathy; PDR proliferative diabetic retinopathy; CSME clinically significant macular edema; DME diabetic macular edema; dbh dot blot hemorrhages; CWS cotton wool spot; POAG primary open angle glaucoma; C/D cup-to-disc ratio; HVF humphrey visual field; GVF goldmann visual field; OCT optical coherence tomography; IOP intraocular pressure; BRVO Branch retinal vein occlusion; CRVO central retinal vein occlusion; CRAO central retinal artery occlusion; BRAO branch retinal artery occlusion; RT retinal tear; SB scleral buckle; PPV pars plana vitrectomy; VH Vitreous hemorrhage; PRP panretinal laser photocoagulation; IVK intravitreal kenalog; VMT vitreomacular traction; MH Macular hole;  NVD neovascularization of the disc; NVE neovascularization elsewhere; AREDS age related eye disease study; ARMD age related macular degeneration; POAG primary open angle glaucoma; EBMD epithelial/anterior basement membrane dystrophy; ACIOL anterior chamber intraocular lens; IOL intraocular lens; PCIOL posterior chamber intraocular lens; Phaco/IOL phacoemulsification with intraocular lens placement; PRK photorefractive keratectomy; LASIK laser assisted in situ keratomileusis; HTN hypertension; DM diabetes mellitus; COPD chronic obstructive pulmonary disease

## 2023-04-22 ENCOUNTER — Ambulatory Visit (INDEPENDENT_AMBULATORY_CARE_PROVIDER_SITE_OTHER): Payer: 59 | Admitting: Ophthalmology

## 2023-04-22 ENCOUNTER — Encounter (INDEPENDENT_AMBULATORY_CARE_PROVIDER_SITE_OTHER): Payer: Self-pay | Admitting: Ophthalmology

## 2023-04-22 DIAGNOSIS — I10 Essential (primary) hypertension: Secondary | ICD-10-CM | POA: Diagnosis not present

## 2023-04-22 DIAGNOSIS — H36829 Proliferative sickle-cell retinopathy, unspecified eye: Secondary | ICD-10-CM

## 2023-04-22 DIAGNOSIS — H35033 Hypertensive retinopathy, bilateral: Secondary | ICD-10-CM

## 2023-04-22 DIAGNOSIS — H3322 Serous retinal detachment, left eye: Secondary | ICD-10-CM

## 2023-04-22 DIAGNOSIS — E119 Type 2 diabetes mellitus without complications: Secondary | ICD-10-CM

## 2023-04-22 DIAGNOSIS — D571 Sickle-cell disease without crisis: Secondary | ICD-10-CM

## 2023-04-22 DIAGNOSIS — Z7985 Long-term (current) use of injectable non-insulin antidiabetic drugs: Secondary | ICD-10-CM

## 2023-04-22 DIAGNOSIS — Z7984 Long term (current) use of oral hypoglycemic drugs: Secondary | ICD-10-CM

## 2023-04-22 MED ORDER — PREDNISOLONE ACETATE 1 % OP SUSP: 1.0000 [drp] | Freq: Three times a day (TID) | OPHTHALMIC | 0 refills | Status: DC

## 2023-04-22 MED ORDER — DORZOLAMIDE HCL-TIMOLOL MAL 2-0.5 % OP SOLN: 1.0000 [drp] | Freq: Every day | OPHTHALMIC | 0 refills | Status: DC

## 2023-04-23 ENCOUNTER — Other Ambulatory Visit: Payer: Self-pay | Admitting: Internal Medicine

## 2023-04-24 ENCOUNTER — Encounter (INDEPENDENT_AMBULATORY_CARE_PROVIDER_SITE_OTHER): Payer: Self-pay | Admitting: Ophthalmology

## 2023-04-28 ENCOUNTER — Other Ambulatory Visit: Payer: Self-pay | Admitting: Internal Medicine

## 2023-05-03 ENCOUNTER — Other Ambulatory Visit: Payer: Self-pay | Admitting: Internal Medicine

## 2023-05-04 DIAGNOSIS — H3522 Other non-diabetic proliferative retinopathy, left eye: Secondary | ICD-10-CM | POA: Diagnosis not present

## 2023-05-04 DIAGNOSIS — H25812 Combined forms of age-related cataract, left eye: Secondary | ICD-10-CM | POA: Diagnosis not present

## 2023-05-04 DIAGNOSIS — H33002 Unspecified retinal detachment with retinal break, left eye: Secondary | ICD-10-CM | POA: Diagnosis not present

## 2023-05-13 NOTE — Progress Notes (Addendum)
[facility] - Clinic Note  05/19/2023   CHIEF COMPLAINT Patient presents for Retina Follow Up  HISTORY OF PRESENT ILLNESS: Nicolas [name] is a 47 y.o. male who presents to the clinic today for:  HPI     Retina Follow Up   Patient presents with  Retinal Break/Detachment.  In left eye.  This started 4 weeks ago.  Duration of 4 weeks.  Since onset it is stable.  I, the attending physician,  performed the HPI with the patient and updated documentation appropriately.        Comments   4 week retina follow up RD OS pt is reporting vision changes noticed he denies any flashes or floaters his last reading was 115       Last edited by [name], MD on 05/19/2023 11:31 PM.      Referring physician: Dorothyann Peng, MD [address] [place],  [place] [address]  HISTORICAL INFORMATION:  Selected notes from the MEDICAL RECORD NUMBER Referred by Dr. Karleen Hampshire for TRD OS LEE:  Ocular Hx- PMH-   CURRENT MEDICATIONS: Current Outpatient Medications (Ophthalmic Drugs)  Medication Sig   dorzolamide-timolol (COSOPT) 2-0.5 % ophthalmic solution Place 1 drop into the left eye daily.   prednisoLONE acetate (PRED FORTE) 1 % ophthalmic suspension Place 1 drop into the left eye 3 (three) times daily.   No current facility-administered medications for this visit. (Ophthalmic Drugs)   Current Outpatient Medications (Other)  Medication Sig   amLODipine (NORVASC) 10 MG tablet Take 1 tablet (10 mg total) by mouth daily.   carvedilol (COREG) 6.25 MG tablet TAKE 1 TABLET BY MOUTH TWICE A DAY WITH MEALS (Patient taking differently: Take 6.25 mg by mouth 2 (two) times daily with a meal.)   Cholecalciferol (VITAMIN D3) 2000 units capsule Take 2,000 Units by mouth daily.   doxycycline (MONODOX) 100 MG capsule Take 1 capsule (100 mg total) by mouth 2 (two) times daily. (Patient not taking: Reported on 03/11/2023)   folic acid (FOLVITE) 1 MG tablet Take 1 tablet (1 mg total) by  mouth daily. (Patient taking differently: Take 1 mg by mouth 2 (two) times daily.)   glucose blood test strip Use as instructed (Patient taking differently: 1 each by Other route every other day. Use as instructed)   HYDROcodone-acetaminophen (NORCO/VICODIN) 5-325 MG tablet Take 1 tablet by mouth every 4 (four) hours as needed for moderate pain.   Lancets (ACCU-CHEK MULTICLIX) lancets Use as instructed (Patient taking differently: 1 each by Other route every other day.)   metFORMIN (GLUCOPHAGE) 500 MG tablet TAKE 1 TABLET BY MOUTH TWICE A DAY (Patient taking differently: Take 500 mg by mouth 2 (two) times daily with a meal.)   Multiple Vitamin (ONE-A-DAY MENS PO) Take 1 tablet by mouth daily with breakfast.   OXBRYTA 500 MG TABS tablet Take 1,500 mg by mouth daily.   oxyCODONE-acetaminophen (PERCOCET) 7.5-325 MG tablet Take 1 tablet by mouth every 4 (four) hours as needed for severe pain.   OZEMPIC, 0.25 OR 0.5 MG/DOSE, 2 MG/1.5ML SOPN INJECT 0.5 MG INTO THE SKIN ONCE A WEEK. (Patient taking differently: Inject 0.25 mg into the skin every Friday.)   pravastatin (PRAVACHOL) 20 MG tablet TAKE 1 TABLET BY MOUTH EVERY DAY IN THE EVENING   triamterene-hydrochlorothiazide (MAXZIDE-25) 37.5-25 MG tablet TAKE 1 TABLET BY MOUTH EVERY DAY   valsartan (DIOVAN) 160 MG tablet Take 1 tablet (160 mg total) by mouth daily.   No current facility-administered medications for this  visit. (Other)   REVIEW OF SYSTEMS: ROS   Positive for: Endocrine, Cardiovascular, Eyes Negative for: Constitutional, Gastrointestinal, Neurological, Skin, Genitourinary, Musculoskeletal, HENT, Respiratory, Psychiatric, Allergic/Imm, Heme/Lymph Last edited by Etheleen Mayhew, COT on 05/19/2023  9:31 AM.     ALLERGIES No Known Allergies PAST MEDICAL HISTORY Past Medical History:  Diagnosis Date   Arthritis    arthritis- back & L hip   Depression    situational depression, pt. out of work    Diabetes mellitus (HCC)    HTN  (hypertension) 05/10/2015   Osteoarthritis of left hip 06/07/2012   Sickle cell anemia (HCC)    Past Surgical History:  Procedure Laterality Date   IR GENERIC HISTORICAL  09/26/2016   IR FLUORO GUIDE CV LINE RIGHT 09/26/2016 Irish Lack, MD [facility]   IR GENERIC HISTORICAL  09/26/2016   IR US GUIDE VASC ACCESS RIGHT 09/26/2016 Irish Lack, MD [facility]   JOINT REPLACEMENT Right 2012   R hip   PHOTOCOAGULATION WITH LASER Right 02/26/2023   Procedure: PHOTOCOAGULATION WITH LASER;  Surgeon: [name], MD;  Location: [facility] OR;  Service: Ophthalmology;  Laterality: Right;   TOTAL HIP ARTHROPLASTY  06/07/2012   Procedure: TOTAL HIP ARTHROPLASTY;  Surgeon: Eulas Post, MD;  Location: [facility] OR;  Service: Orthopedics;  Laterality: Left;   VITRECTOMY 25 GAUGE WITH SCLERAL BUCKLE Left 02/26/2023   Procedure: VITRECTOMY 25 GAUGE WITH SCLERAL BUCKLE;  Surgeon: [name], MD;  Location: [facility] OR;  Service: Ophthalmology;  Laterality: Left;   FAMILY HISTORY Family History  Problem Relation Age of Onset   Diabetes Mother    Cancer - Other Father    Liver cancer Father    CAD Neg Hx    SOCIAL HISTORY Social History   Tobacco Use   Smoking status: Never   Smokeless tobacco: Never  Vaping Use   Vaping status: Never Used  Substance Use Topics   Alcohol use: Not Currently    Comment: ocassional    Drug use: Never       OPHTHALMIC EXAM:  Base Eye Exam     Visual Acuity (Snellen - Linear)       Right Left   Dist cc 20/20 20/100 -2   Dist ph cc  20/80 -3    Correction: Glasses         Tonometry (Tonopen, 9:34 AM)       Right Left   Pressure 15 16         Pupils       Pupils Dark Light Shape React APD   Right PERRL 3 2 Round Brisk None   Left PERRL 5 5 Round Minimal None         Visual Fields       Left Right    Full Full         Extraocular Movement       Right Left    Full, Ortho Full, Ortho         Neuro/Psych     Oriented x3: Yes    Mood/Affect: Normal         Dilation     Both eyes: 2.5% Phenylephrine @ 9:36 AM           Slit Lamp and Fundus Exam     Slit Lamp Exam       Right Left   Lids/Lashes Dermatochalasis - upper lid Dermatochalasis - upper lid   Conjunctiva/Sclera Melanosis Melanosis   Cornea Clear arcus, trace PEE,  Debris in tear film, Well healed cataract wound   Anterior Chamber deep and clear Deep, 1-2+ cell/pigment   Iris Round and dilated, No NVI Round and dilated   Lens 2+ Nuclear sclerosis, 2+ Cortical cataract PC IOL in good position with fine pigment on posterior capsule   Anterior Vitreous clear Post vitrectomy, mild pigment, bubble gone         Fundus Exam       Right Left   Disc Pink and Sharp, +PPP Pink and sharp, mild pallor, sharp rim   C/D Ratio 0.6 0.6   Macula Flat, Blunted foveal reflex, RPE mottling, No heme or edema Flat, Blunted foveal reflex, Drusen, mild fibrosis ST mac., mild ERM inferior mac   Vessels attenuated, mild tortuosity, copper wiring, fibrotic NV IT, early NV nasal periphery attenuated, Tortuous, copper wiring, fibrotic NVE IT periphery with surrounding heme- improved, fibrotic NVE nasal periphery,    Periphery Attached, fibrotic NV IT, early NV nasal periphery, good segmental PRP changes surrounding both areas, no new NV/heme Retina attached over buckle, good buckle height, good laser over buckle. Tractional fibrosis improved, no heme PRE-OP: total TRD greatest nasal and inferior quads; fibrotic NVE nasal and IT periphery; ?stretch hole at 0800           Refraction     Wearing Rx       Sphere Cylinder Axis Add   Right +1.25 +0.25 086 +2.50   Left +1.25 +0.25 093 +2.50           IMAGING AND PROCEDURES  Imaging and Procedures for 05/19/2023  OCT, Retina - OU - Both Eyes       Right Eye Quality was good. Central Foveal Thickness: 220. Progression has been stable. Findings include normal foveal contour, no IRF, no SRF, vitreomacular adhesion  .   Left Eye Quality was good. Central Foveal Thickness: 220. Progression has improved. Findings include normal foveal contour, no IRF, no SRF, myopic contour, epiretinal membrane, macular pucker (Retina re-attached, patchy ORA greatest centrally, stable ERM with pucker, mild IN schisis caught on widefiled).   Notes *Images captured and stored on drive  Diagnosis / Impression:  OD: NFP, no IRF/SRF OS: Retina re-attached, patchy ORA greatest centrally, stable ERM with pucker, mild IN schisis caught on widefiled  Clinical management:  See below  Abbreviations: NFP - Normal foveal profile. CME - cystoid macular edema. PED - pigment epithelial detachment. IRF - intraretinal fluid. SRF - subretinal fluid. EZ - ellipsoid zone. ERM - epiretinal membrane. ORA - outer retinal atrophy. ORT - outer retinal tubulation. SRHM - subretinal hyper-reflective material. IRHM - intraretinal hyper-reflective material           ASSESSMENT/PLAN:   ICD-10-CM   1. Left retinal detachment  H33.22 OCT, Retina - OU - Both Eyes    2. 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 Sickle cell disease without crisis (HCC)  D57.1     4. Essential hypertension  I10     5. Hypertensive retinopathy of both eyes  H35.033     6. Diabetes mellitus type 2 without retinopathy (HCC)  E11.9     7. Long term (current) use of oral hypoglycemic drugs  Z79.84     8. Long-term (current) use of injectable non-insulin antidiabetic drugs  Z79.85     9. Combined forms of age-related cataract of right eye  H25.811     10. Pseudophakia of left eye  Z96.1      Retinal detachment, OS - at presentation, pt reported ~1 month history of decreased vision OS - exam showed near total tractional detachment w/ ?rhegmatogenous component -- detachment greatest in nasal and inferior quadrants - focal areas of tractional fibrosis nasal and inferotemporal periphery w/ associated  neovascularization  - retinal hole at 0800 - pt with history of sickle cell disease ([place]) -- retinopathy likely etiology of traction and fibrosis - POW8 s/p PPV/PFO/EL/FAX/14% C3F8 OS, 05.30.2024             - doing well - retina attached and in good position -- good buckle height and laser around breaks - gas bubble gone             - IOP 16  - BCVA OS - 20/80 from 20/100 -- s/p CEIOL OS w/ Dr. Dione Booze on Friday 08.16.24  - cont post op drops per Dr. Dione Booze PF, Ofloxacin and Ketorolac QID OS Cosopt OS qdaily           - f/u 6-8 weeks -- DFE/OCT  2,3. 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 Hypertensive retinopathy OU - discussed importance of tight BP control - monitor  6-8. Diabetes mellitus, type 2 without retinopathy  - A1c 4.2 on 05.20.24  - FA (05.23.24) with no significant MA OU - The incidence, risk factors for progression, natural history and treatment options for diabetic retinopathy  were discussed with patient.   - The need for close monitoring of blood glucose, blood pressure, and serum lipids, avoiding cigarette or any type of tobacco, and the need for long term follow up was also discussed with patient. - f/u in 1 year, sooner prn  9. Mixed Cataract OD - The symptoms of cataract, surgical options, and treatments and risks were discussed with patient. - discussed diagnosis and likely progression post PPV - will refer to [facility]  10. Pseudophakia OS  - s/p CE/IOL OS (08.16.24)  - IOL in good position, doing well - post op drops per Dr. Dione Booze - tapering PF, Oflox and Ketorolac -- each currently QID OS  - monitor   Ophthalmic Meds Ordered this visit:  No orders of the defined types were placed in this encounter.    Return for 6-8 wks - f/u RD OS, DFE, OCT.  There are no  Patient Instructions on file for this visit.  Explained the diagnoses, plan, and follow up with the patient and they expressed understanding.  Patient expressed understanding of the importance of proper follow up care.   This document serves as a record of services personally performed by Karie Chimera, MD, PhD. It was created on their behalf by Laurey Morale, COT an ophthalmic technician. The creation of this record is the provider's dictation and/or activities during the visit.    Electronically signed by:  Charlette Caffey, COT  05/19/23 11:39 PM  Karie Chimera, M.D., Ph.D. Diseases & Surgery of the Retina and Vitreous [facility]  I have reviewed the above documentation for accuracy and completeness, and I agree with the above. Karie Chimera, M.D., Ph.D. 05/19/23 11:39 PM  Abbreviations: M myopia (nearsighted); A astigmatism; H hyperopia (farsighted); P presbyopia; Mrx spectacle prescription;  CTL contact lenses; OD right eye; OS left  eye; OU both eyes  XT exotropia; ET esotropia; PEK punctate epithelial keratitis; PEE punctate epithelial erosions; DES dry eye syndrome; MGD meibomian gland dysfunction; ATs artificial tears; PFAT's preservative free artificial tears; NSC nuclear sclerotic cataract; PSC posterior subcapsular cataract; ERM epi-retinal membrane; PVD posterior vitreous detachment; RD retinal detachment; DM diabetes mellitus; DR diabetic retinopathy; NPDR non-proliferative diabetic retinopathy; PDR proliferative diabetic retinopathy; CSME clinically significant macular edema; DME diabetic macular edema; dbh dot blot hemorrhages; CWS cotton wool spot; POAG primary open angle glaucoma; C/D cup-to-disc ratio; HVF humphrey visual field; GVF goldmann visual field; OCT optical coherence tomography; IOP intraocular pressure; BRVO Branch retinal vein occlusion; CRVO central retinal vein occlusion; CRAO central retinal artery occlusion; BRAO branch retinal  artery occlusion; RT retinal tear; SB scleral buckle; PPV pars plana vitrectomy; VH Vitreous hemorrhage; PRP panretinal laser photocoagulation; IVK intravitreal kenalog; VMT vitreomacular traction; MH Macular hole;  NVD neovascularization of the disc; NVE neovascularization elsewhere; AREDS age related eye disease study; ARMD age related macular degeneration; POAG primary open angle glaucoma; EBMD epithelial/anterior basement membrane dystrophy; ACIOL anterior chamber intraocular lens; IOL intraocular lens; PCIOL posterior chamber intraocular lens; Phaco/IOL phacoemulsification with intraocular lens placement; PRK photorefractive keratectomy; LASIK laser assisted in situ keratomileusis; HTN hypertension; DM diabetes mellitus; COPD chronic obstructive pulmonary disease

## 2023-05-15 DIAGNOSIS — H25812 Combined forms of age-related cataract, left eye: Secondary | ICD-10-CM | POA: Diagnosis not present

## 2023-05-19 ENCOUNTER — Encounter (INDEPENDENT_AMBULATORY_CARE_PROVIDER_SITE_OTHER): Payer: Self-pay | Admitting: Ophthalmology

## 2023-05-19 ENCOUNTER — Ambulatory Visit (INDEPENDENT_AMBULATORY_CARE_PROVIDER_SITE_OTHER): Payer: 59 | Admitting: Ophthalmology

## 2023-05-19 DIAGNOSIS — Z7984 Long term (current) use of oral hypoglycemic drugs: Secondary | ICD-10-CM

## 2023-05-19 DIAGNOSIS — D571 Sickle-cell disease without crisis: Secondary | ICD-10-CM

## 2023-05-19 DIAGNOSIS — I10 Essential (primary) hypertension: Secondary | ICD-10-CM

## 2023-05-19 DIAGNOSIS — E119 Type 2 diabetes mellitus without complications: Secondary | ICD-10-CM

## 2023-05-19 DIAGNOSIS — H3322 Serous retinal detachment, left eye: Secondary | ICD-10-CM | POA: Diagnosis not present

## 2023-05-19 DIAGNOSIS — H25813 Combined forms of age-related cataract, bilateral: Secondary | ICD-10-CM

## 2023-05-19 DIAGNOSIS — H35033 Hypertensive retinopathy, bilateral: Secondary | ICD-10-CM

## 2023-05-19 DIAGNOSIS — Z961 Presence of intraocular lens: Secondary | ICD-10-CM

## 2023-05-19 DIAGNOSIS — Z7985 Long-term (current) use of injectable non-insulin antidiabetic drugs: Secondary | ICD-10-CM

## 2023-05-19 DIAGNOSIS — H25811 Combined forms of age-related cataract, right eye: Secondary | ICD-10-CM

## 2023-05-28 DIAGNOSIS — E559 Vitamin D deficiency, unspecified: Secondary | ICD-10-CM | POA: Diagnosis not present

## 2023-05-28 DIAGNOSIS — G8929 Other chronic pain: Secondary | ICD-10-CM | POA: Diagnosis not present

## 2023-06-09 DIAGNOSIS — E1122 Type 2 diabetes mellitus with diabetic chronic kidney disease: Secondary | ICD-10-CM | POA: Diagnosis not present

## 2023-06-09 DIAGNOSIS — R809 Proteinuria, unspecified: Secondary | ICD-10-CM | POA: Diagnosis not present

## 2023-06-09 DIAGNOSIS — E559 Vitamin D deficiency, unspecified: Secondary | ICD-10-CM | POA: Diagnosis not present

## 2023-06-09 DIAGNOSIS — I129 Hypertensive chronic kidney disease with stage 1 through stage 4 chronic kidney disease, or unspecified chronic kidney disease: Secondary | ICD-10-CM | POA: Diagnosis not present

## 2023-06-09 DIAGNOSIS — N182 Chronic kidney disease, stage 2 (mild): Secondary | ICD-10-CM | POA: Diagnosis not present

## 2023-06-23 ENCOUNTER — Ambulatory Visit: Payer: 59 | Admitting: Internal Medicine

## 2023-06-23 ENCOUNTER — Encounter: Payer: Self-pay | Admitting: Internal Medicine

## 2023-06-23 VITALS — BP 132/98 | HR 88 | Temp 98.1°F | Ht 68.0 in | Wt 225.0 lb

## 2023-06-23 DIAGNOSIS — E6609 Other obesity due to excess calories: Secondary | ICD-10-CM

## 2023-06-23 DIAGNOSIS — M545 Low back pain, unspecified: Secondary | ICD-10-CM | POA: Diagnosis not present

## 2023-06-23 DIAGNOSIS — N182 Chronic kidney disease, stage 2 (mild): Secondary | ICD-10-CM | POA: Diagnosis not present

## 2023-06-23 DIAGNOSIS — I129 Hypertensive chronic kidney disease with stage 1 through stage 4 chronic kidney disease, or unspecified chronic kidney disease: Secondary | ICD-10-CM | POA: Diagnosis not present

## 2023-06-23 DIAGNOSIS — G8929 Other chronic pain: Secondary | ICD-10-CM

## 2023-06-23 DIAGNOSIS — E1122 Type 2 diabetes mellitus with diabetic chronic kidney disease: Secondary | ICD-10-CM | POA: Diagnosis not present

## 2023-06-23 DIAGNOSIS — Z6834 Body mass index (BMI) 34.0-34.9, adult: Secondary | ICD-10-CM

## 2023-06-23 MED ORDER — TIZANIDINE HCL 4 MG PO TABS: 4.0000 mg | ORAL_TABLET | Freq: Every day | ORAL | 0 refills | Status: DC

## 2023-06-23 NOTE — Progress Notes (Signed)
Nicolas [name], CMA,acting as a Neurosurgeon for Nicolas Aliment, MD.,have documented all relevant documentation on the behalf of Nicolas Aliment, MD,as directed by  Nicolas Aliment, MD while in the presence of Nicolas Aliment, MD.  Subjective:  Patient ID: Nicolas [name] , male    DOB: May 27, 1976 , 47 y.o.   MRN: [id number]  Chief Complaint  Patient presents with   Diabetes   Hypertension    HPI  Patient presents today for a BP and DM check, patient reports compliance with medications. Patient does not have any other questions or concerns at this time. He states his morning sugars run between 92-110.   Diabetes He presents for his follow-up diabetic visit. He has type 2 diabetes mellitus. There are no hypoglycemic associated symptoms. There are no diabetic associated symptoms. Pertinent negatives for diabetes include no blurred vision, no chest pain, no polydipsia, no polyphagia and no polyuria. There are no hypoglycemic complications. Diabetic complications include nephropathy. Risk factors for coronary artery disease include diabetes mellitus, male sex, obesity, hypertension and dyslipidemia. Current diabetic treatment includes oral agent (dual therapy). His weight is stable. He is following a diabetic diet. When asked about meal planning, he reported none. He has not had a previous visit with a dietitian. He participates in exercise intermittently. His home blood glucose trend is fluctuating minimally. His breakfast blood glucose is taken between 8-9 am. His breakfast blood glucose range is generally 110-130 mg/dl. (Reports blood sugar has been good but does not know the readings. ) An ACE inhibitor/angiotensin II receptor blocker is being taken. Eye exam current: he needs to call Dr. Karleen Hampshire for an appt..  Hypertension This is a chronic problem. The current episode started more than 1 year ago. The problem has been gradually improving since onset. The problem is controlled. Pertinent negatives  include no blurred vision, chest pain, palpitations or shortness of breath. Past treatments include ACE inhibitors, calcium channel blockers and beta blockers. The current treatment provides moderate improvement. Compliance problems include exercise.  Hypertensive end-organ damage includes kidney disease.     Past Medical History:  Diagnosis Date   Arthritis    arthritis- back & L hip   Depression    situational depression, pt. out of work    Diabetes mellitus (HCC)    HTN (hypertension) 05/10/2015   Osteoarthritis of left hip 06/07/2012   Sickle cell anemia (HCC)      Family History  Problem Relation Age of Onset   Diabetes Mother    Cancer - Other Father    Liver cancer Father    CAD Neg Hx      Current Outpatient Medications:    amLODipine (NORVASC) 10 MG tablet, Take 1 tablet (10 mg total) by mouth daily., Disp: 90 tablet, Rfl: 1   carvedilol (COREG) 6.25 MG tablet, TAKE 1 TABLET BY MOUTH TWICE A DAY WITH MEALS (Patient taking differently: Take 6.25 mg by mouth 2 (two) times daily with a meal.), Disp: 180 tablet, Rfl: 1   Cholecalciferol (VITAMIN D3) 2000 units capsule, Take 2,000 Units by mouth daily., Disp: , Rfl: 5   dorzolamide-timolol (COSOPT) 2-0.5 % ophthalmic solution, Place 1 drop into the left eye daily., Disp: 10 mL, Rfl: 0   doxycycline (MONODOX) 100 MG capsule, Take 1 capsule (100 mg total) by mouth 2 (two) times daily., Disp: 14 capsule, Rfl: 0   folic acid (FOLVITE) 1 MG tablet, Take 1 tablet (1 mg total) by mouth daily. (Patient taking differently: Take 1  mg by mouth 2 (two) times daily.), Disp: 30 tablet, Rfl: 11   glucose blood test strip, Use as instructed (Patient taking differently: 1 each by Other route every other day. Use as instructed), Disp: 100 each, Rfl: 12   HYDROcodone-acetaminophen (NORCO/VICODIN) 5-325 MG tablet, Take 1 tablet by mouth every 4 (four) hours as needed for moderate pain., Disp: 20 tablet, Rfl: 0   Lancets (ACCU-CHEK MULTICLIX) lancets,  Use as instructed (Patient taking differently: 1 each by Other route every other day.), Disp: 100 each, Rfl: 12   metFORMIN (GLUCOPHAGE) 500 MG tablet, TAKE 1 TABLET BY MOUTH TWICE A DAY (Patient taking differently: Take 500 mg by mouth 2 (two) times daily with a meal.), Disp: 180 tablet, Rfl: 2   Multiple Vitamin (ONE-A-DAY MENS PO), Take 1 tablet by mouth daily with breakfast., Disp: , Rfl:    OXBRYTA 500 MG TABS tablet, Take 1,500 mg by mouth daily., Disp: , Rfl:    oxyCODONE-acetaminophen (PERCOCET) 7.5-325 MG tablet, Take 1 tablet by mouth every 4 (four) hours as needed for severe pain., Disp: 20 tablet, Rfl: 0   OZEMPIC, 0.25 OR 0.5 MG/DOSE, 2 MG/1.5ML SOPN, INJECT 0.5 MG INTO THE SKIN ONCE A WEEK. (Patient taking differently: Inject 0.25 mg into the skin every Friday.), Disp: 4.5 mL, Rfl: 3   pravastatin (PRAVACHOL) 20 MG tablet, TAKE 1 TABLET BY MOUTH EVERY DAY IN THE EVENING, Disp: 90 tablet, Rfl: 1   prednisoLONE acetate (PRED FORTE) 1 % ophthalmic suspension, Place 1 drop into the left eye 3 (three) times daily., Disp: 15 mL, Rfl: 0   tiZANidine (ZANAFLEX) 4 MG tablet, Take 1 tablet (4 mg total) by mouth daily., Disp: 30 tablet, Rfl: 0   triamterene-hydrochlorothiazide (MAXZIDE-25) 37.5-25 MG tablet, TAKE 1 TABLET BY MOUTH EVERY DAY, Disp: 90 tablet, Rfl: 1   valsartan (DIOVAN) 160 MG tablet, Take 1 tablet (160 mg total) by mouth daily., Disp: 30 tablet, Rfl: 11   No Known Allergies   Review of Systems  Constitutional: Negative.   Eyes: Negative.  Negative for blurred vision.  Respiratory: Negative.  Negative for shortness of breath.   Cardiovascular: Negative.  Negative for chest pain and palpitations.  Gastrointestinal: Negative.   Endocrine: Negative for polydipsia, polyphagia and polyuria.  Musculoskeletal: Negative.   Skin: Negative.   Psychiatric/Behavioral: Negative.       Today's Vitals   06/23/23 1500 06/23/23 1525  BP: (!) 142/90 (!) 132/98  Pulse: 88   Temp: 98.1  F (36.7 C)   Weight: 225 lb (102.1 kg)   Height: 5\' 8"  (1.727 m)   PainSc: 7    PainLoc: Back    Body mass index is 34.21 kg/m.  Wt Readings from Last 3 Encounters:  06/23/23 225 lb (102.1 kg)  03/11/23 210 lb (95.3 kg)  03/05/23 217 lb (98.4 kg)   BP Readings from Last 3 Encounters:  06/23/23 (!) 132/98  03/05/23 130/84  02/26/23 131/80      The ASCVD Risk score (Arnett DK, et al., 2019) failed to calculate for the following reasons:   The valid total cholesterol range is 130 to 320 mg/dL  Objective:  Physical Exam Vitals and nursing note reviewed.  Constitutional:      Appearance: Normal appearance. He is obese.  HENT:     Head: Normocephalic and atraumatic.     Nose:     Comments: Masked     Mouth/Throat:     Mouth: Mucous membranes are moist.     Pharynx:  Oropharynx is clear.     Comments: Masked  Eyes:     Extraocular Movements: Extraocular movements intact.     Conjunctiva/sclera: Conjunctivae normal.     Pupils: Pupils are equal, round, and reactive to light.  Cardiovascular:     Rate and Rhythm: Normal rate and regular rhythm.     Pulses: Normal pulses.     Heart sounds: Normal heart sounds.  Pulmonary:     Effort: Pulmonary effort is normal.     Breath sounds: Normal breath sounds.  Genitourinary:    Comments: deferred Musculoskeletal:        General: Tenderness present. Normal range of motion.     Cervical back: Normal range of motion and neck supple.  Skin:    General: Skin is warm and dry.  Neurological:     General: No focal deficit present.     Mental Status: He is alert and oriented to person, place, and time.  Psychiatric:        Mood and Affect: Mood normal.        Behavior: Behavior normal.         Assessment And Plan:  Hypertensive nephropathy Assessment & Plan: Chronic, uncontrolled. No med changes today, exacerbation likely due to pain.  He will c/w valsartan 160mg , Maxzide 37.5/25mg  daily, amlodipine 10mg  and carvedilol 6.2mg   twice daily. He is encouraged to follow a low sodium diet.   Orders: -     CMP14+EGFR -     TSH  Type 2 diabetes mellitus with stage 2 chronic kidney disease, without long-term current use of insulin (HCC) Assessment & Plan: Chronic, he will continue with metformin twice daily. Hesitant to use SGLT2i due to risk of dehydration which could trigger sickle cell crisis. Would also benefit from GLP-1 agonists. Will need to consider oral option if he is not willing to consider weekly injectable option.   Orders: -     CMP14+EGFR -     Hemoglobin A1c -     Fructosamine  Chronic left-sided low back pain without sciatica Assessment & Plan: Chronic, with acute flare. I will send rx tizanidine to use nightly prn. He was also given some stretching exercises to perform daily.    Class 1 obesity due to excess calories with serious comorbidity and body mass index (BMI) of 34.0 to 34.9 in adult Assessment & Plan: He is encouraged to initially strive for BMI less than 30 to decrease cardiac risk. He is advised to exercise no less than 150 minutes per week.     Other orders -     tiZANidine HCl; Take 1 tablet (4 mg total) by mouth daily.  Dispense: 30 tablet; Refill: 0    Return for controlled DM check-4 months.  Patient was given opportunity to ask questions. Patient verbalized understanding of the plan and was able to repeat key elements of the plan. All questions were answered to their satisfaction.    I, Nicolas Aliment, MD, have reviewed all documentation for this visit. The documentation on 06/23/23 for the exam, diagnosis, procedures, and orders are all accurate and complete.   IF YOU HAVE BEEN REFERRED TO A SPECIALIST, IT MAY TAKE 1-2 WEEKS TO SCHEDULE/PROCESS THE REFERRAL. IF YOU HAVE NOT HEARD FROM US/SPECIALIST IN TWO WEEKS, PLEASE GIVE [place] A CALL AT [phone].

## 2023-06-24 LAB — TSH: TSH: 2.56 u[IU]/mL (ref 0.450–4.500)

## 2023-06-24 LAB — CMP14+EGFR
ALT: 35 IU/L (ref 0–44)
AST: 38 IU/L (ref 0–40)
Albumin: 4.4 g/dL (ref 4.1–5.1)
Alkaline Phosphatase: 56 IU/L (ref 44–121)
BUN/Creatinine Ratio: 18 (ref 9–20)
BUN: 22 mg/dL (ref 6–24)
Bilirubin Total: 0.4 mg/dL (ref 0.0–1.2)
CO2: 25 mmol/L (ref 20–29)
Calcium: 9.4 mg/dL (ref 8.7–10.2)
Chloride: 102 mmol/L (ref 96–106)
Creatinine, Ser: 1.23 mg/dL (ref 0.76–1.27)
Globulin, Total: 2.7 g/dL (ref 1.5–4.5)
Glucose: 102 mg/dL — ABNORMAL HIGH (ref 70–99)
Potassium: 4 mmol/L (ref 3.5–5.2)
Sodium: 144 mmol/L (ref 134–144)
Total Protein: 7.1 g/dL (ref 6.0–8.5)
eGFR: 72 mL/min/{1.73_m2} (ref >59)

## 2023-06-24 LAB — HEMOGLOBIN A1C
Est. average glucose Bld gHb Est-mCnc: 74 mg/dL
Hgb A1c MFr Bld: 4.2 % — ABNORMAL LOW (ref 4.8–5.6)

## 2023-06-24 LAB — FRUCTOSAMINE: Fructosamine: 244 umol/L (ref 0–285)

## 2023-06-26 ENCOUNTER — Ambulatory Visit: Payer: Self-pay

## 2023-06-26 NOTE — Progress Notes (Deleted)
Patient presents today for BP check and med check.

## 2023-06-29 NOTE — Assessment & Plan Note (Signed)
Chronic, with acute flare. I will send rx tizanidine to use nightly prn. He was also given some stretching exercises to perform daily.

## 2023-06-29 NOTE — Assessment & Plan Note (Signed)
Chronic, he will continue with metformin twice daily. Hesitant to use SGLT2i due to risk of dehydration which could trigger sickle cell crisis. Would also benefit from GLP-1 agonists. Will need to consider oral option if he is not willing to consider weekly injectable option.

## 2023-06-29 NOTE — Assessment & Plan Note (Signed)
Chronic, uncontrolled. No med changes today, exacerbation likely due to pain.  He will c/w valsartan 160mg , Maxzide 37.5/25mg  daily, amlodipine 10mg  and carvedilol 6.2mg  twice daily. He is encouraged to follow a low sodium diet.

## 2023-06-29 NOTE — Assessment & Plan Note (Signed)
He is encouraged to initially strive for BMI less than 30 to decrease cardiac risk. He is advised to exercise no less than 150 minutes per week.

## 2023-07-14 ENCOUNTER — Ambulatory Visit (INDEPENDENT_AMBULATORY_CARE_PROVIDER_SITE_OTHER): Payer: 59 | Admitting: Ophthalmology

## 2023-07-14 ENCOUNTER — Encounter (INDEPENDENT_AMBULATORY_CARE_PROVIDER_SITE_OTHER): Payer: Self-pay | Admitting: Ophthalmology

## 2023-07-14 ENCOUNTER — Encounter (INDEPENDENT_AMBULATORY_CARE_PROVIDER_SITE_OTHER): Payer: 59 | Admitting: Ophthalmology

## 2023-07-14 DIAGNOSIS — H35033 Hypertensive retinopathy, bilateral: Secondary | ICD-10-CM | POA: Diagnosis not present

## 2023-07-14 DIAGNOSIS — H25811 Combined forms of age-related cataract, right eye: Secondary | ICD-10-CM

## 2023-07-14 DIAGNOSIS — H36829 Proliferative sickle-cell retinopathy, unspecified eye: Secondary | ICD-10-CM

## 2023-07-14 DIAGNOSIS — I10 Essential (primary) hypertension: Secondary | ICD-10-CM

## 2023-07-14 DIAGNOSIS — Z7984 Long term (current) use of oral hypoglycemic drugs: Secondary | ICD-10-CM

## 2023-07-14 DIAGNOSIS — Z7985 Long-term (current) use of injectable non-insulin antidiabetic drugs: Secondary | ICD-10-CM

## 2023-07-14 DIAGNOSIS — D571 Sickle-cell disease without crisis: Secondary | ICD-10-CM

## 2023-07-14 DIAGNOSIS — E119 Type 2 diabetes mellitus without complications: Secondary | ICD-10-CM | POA: Diagnosis not present

## 2023-07-14 DIAGNOSIS — Z961 Presence of intraocular lens: Secondary | ICD-10-CM

## 2023-07-14 DIAGNOSIS — H3322 Serous retinal detachment, left eye: Secondary | ICD-10-CM

## 2023-07-14 NOTE — Progress Notes (Signed)
[facility] - Clinic Note  07/14/2023   CHIEF COMPLAINT Patient presents for Retina Follow Up  HISTORY OF PRESENT ILLNESS: Nicolas [name] is a 47 y.o. male who presents to the clinic today for:  HPI     Retina Follow Up   Patient presents with  Retinal Break/Detachment.  In left eye.  This started 7 weeks ago.  Duration of 7 weeks.  Since onset it is gradually improving.  I, the attending physician,  performed the HPI with the patient and updated documentation appropriately.        Comments   7 week retina follow up RD OS pt is reporting vision seems to be improving little he still see what looks water that runs he denies any flashes or floaters he is not checking bs at this time       Last edited by [name], MD on 07/15/2023 12:38 AM.    Pt states he is off all drops   Referring physician: Dorothyann Peng, MD [address] [place],  [place] [address]  HISTORICAL INFORMATION:  Selected notes from the MEDICAL RECORD NUMBER Referred by Dr. Karleen Hampshire for TRD OS LEE:  Ocular Hx- PMH-   CURRENT MEDICATIONS: Current Outpatient Medications (Ophthalmic Drugs)  Medication Sig   dorzolamide-timolol (COSOPT) 2-0.5 % ophthalmic solution Place 1 drop into the left eye daily.   prednisoLONE acetate (PRED FORTE) 1 % ophthalmic suspension Place 1 drop into the left eye 3 (three) times daily.   No current facility-administered medications for this visit. (Ophthalmic Drugs)   Current Outpatient Medications (Other)  Medication Sig   amLODipine (NORVASC) 10 MG tablet Take 1 tablet (10 mg total) by mouth daily.   carvedilol (COREG) 6.25 MG tablet TAKE 1 TABLET BY MOUTH TWICE A DAY WITH MEALS (Patient taking differently: Take 6.25 mg by mouth 2 (two) times daily with a meal.)   Cholecalciferol (VITAMIN D3) 2000 units capsule Take 2,000 Units by mouth daily.   doxycycline (MONODOX) 100 MG capsule Take 1 capsule (100 mg total) by mouth 2 (two) times daily.    folic acid (FOLVITE) 1 MG tablet Take 1 tablet (1 mg total) by mouth daily. (Patient taking differently: Take 1 mg by mouth 2 (two) times daily.)   glucose blood test strip Use as instructed (Patient taking differently: 1 each by Other route every other day. Use as instructed)   HYDROcodone-acetaminophen (NORCO/VICODIN) 5-325 MG tablet Take 1 tablet by mouth every 4 (four) hours as needed for moderate pain.   Lancets (ACCU-CHEK MULTICLIX) lancets Use as instructed (Patient taking differently: 1 each by Other route every other day.)   metFORMIN (GLUCOPHAGE) 500 MG tablet TAKE 1 TABLET BY MOUTH TWICE A DAY (Patient taking differently: Take 500 mg by mouth 2 (two) times daily with a meal.)   Multiple Vitamin (ONE-A-DAY MENS PO) Take 1 tablet by mouth daily with breakfast.   OXBRYTA 500 MG TABS tablet Take 1,500 mg by mouth daily.   oxyCODONE-acetaminophen (PERCOCET) 7.5-325 MG tablet Take 1 tablet by mouth every 4 (four) hours as needed for severe pain.   OZEMPIC, 0.25 OR 0.5 MG/DOSE, 2 MG/1.5ML SOPN INJECT 0.5 MG INTO THE SKIN ONCE A WEEK. (Patient taking differently: Inject 0.25 mg into the skin every Friday.)   pravastatin (PRAVACHOL) 20 MG tablet TAKE 1 TABLET BY MOUTH EVERY DAY IN THE EVENING   tiZANidine (ZANAFLEX) 4 MG tablet Take 1 tablet (4 mg total) by mouth daily.   triamterene-hydrochlorothiazide (MAXZIDE-25) 37.5-25 MG  tablet TAKE 1 TABLET BY MOUTH EVERY DAY   valsartan (DIOVAN) 160 MG tablet Take 1 tablet (160 mg total) by mouth daily.   No current facility-administered medications for this visit. (Other)   REVIEW OF SYSTEMS: ROS   Positive for: Endocrine, Cardiovascular, Eyes Negative for: Constitutional, Gastrointestinal, Neurological, Skin, Genitourinary, Musculoskeletal, HENT, Respiratory, Psychiatric, Allergic/Imm, Heme/Lymph Last edited by Etheleen Mayhew, COT on 07/14/2023 12:56 PM.      ALLERGIES No Known Allergies PAST MEDICAL HISTORY Past Medical History:   Diagnosis Date   Arthritis    arthritis- back & L hip   Depression    situational depression, pt. out of work    Diabetes mellitus (HCC)    HTN (hypertension) 05/10/2015   Osteoarthritis of left hip 06/07/2012   Sickle cell anemia (HCC)    Past Surgical History:  Procedure Laterality Date   IR GENERIC HISTORICAL  09/26/2016   IR FLUORO GUIDE CV LINE RIGHT 09/26/2016 Irish Lack, MD [facility]   IR GENERIC HISTORICAL  09/26/2016   IR US GUIDE VASC ACCESS RIGHT 09/26/2016 Irish Lack, MD [facility]   JOINT REPLACEMENT Right 2012   R hip   PHOTOCOAGULATION WITH LASER Right 02/26/2023   Procedure: PHOTOCOAGULATION WITH LASER;  Surgeon: [name], MD;  Location[facility] OR;  Service: Ophthalmology;  Laterality: Right;   TOTAL HIP ARTHROPLASTY  06/07/2012   Procedure: TOTAL HIP ARTHROPLASTY;  Surgeon: Eulas Post, MD;  Location: [facility] OR;  Service: Orthopedics;  Laterality: Left;   VITRECTOMY 25 GAUGE WITH SCLERAL BUCKLE Left 02/26/2023   Procedure: VITRECTOMY 25 GAUGE WITH SCLERAL BUCKLE;  Surgeon: [name], MD;  Location[facility] OR;  Service: Ophthalmology;  Laterality: Left;   FAMILY HISTORY Family History  Problem Relation Age of Onset   Diabetes Mother    Cancer - Other Father    Liver cancer Father    CAD Neg Hx    SOCIAL HISTORY Social History   Tobacco Use   Smoking status: Never   Smokeless tobacco: Never  Vaping Use   Vaping status: Never Used  Substance Use Topics   Alcohol use: Not Currently    Comment: ocassional    Drug use: Never       OPHTHALMIC EXAM:  Base Eye Exam     Visual Acuity (Snellen - Linear)       Right Left   Dist cc 20/20 -2 20/100 -2   Dist ph cc  20/80 -1    Correction: Glasses         Tonometry (Tonopen, 1:00 PM)       Right Left   Pressure 18 18         Pupils       Pupils Dark Light Shape React APD   Right PERRL 3 2 Round Brisk None   Left PERRL 5 5 Round Minimal None         Visual Fields        Left Right    Full Full         Extraocular Movement       Right Left    Full, Ortho Full, Ortho         Neuro/Psych     Oriented x3: Yes   Mood/Affect: Normal         Dilation     Left eye: 2.5% Phenylephrine @ 1:01 PM           Slit Lamp and Fundus Exam     Slit  Lamp Exam       Right Left   Lids/Lashes Dermatochalasis - upper lid Dermatochalasis - upper lid   Conjunctiva/Sclera Melanosis Melanosis   Cornea Clear arcus, trace PEE, tear film debris, well healed cataract wound   Anterior Chamber deep, clear, narrow temporal angle Deep, 2+ fine cell/pigment   Iris Round and reactive Round and dilated   Lens 2+ Nuclear sclerosis, 2+ Cortical cataract PC IOL in good position with fine pigment on posterior capsule   Anterior Vitreous clear Post vitrectomy, mild pigment         Fundus Exam       Right Left   Disc Pink and Sharp, +PPP Trace temporal pallor, sharp rim   C/D Ratio 0.6 0.6   Macula Flat, Blunted foveal reflex, RPE mottling, No heme or edema Flat, Blunted foveal reflex, Drusen, mild fibrosis ST mac, mild ERM with striae inferior mac   Vessels attenuated, mild tortuosity, copper wiring, fibrotic NV IT, early NV nasal periphery attenuated, Tortuous, copper wiring, fibrotic NVE IT periphery with surrounding heme improved, fibrotic NVE nasal periphery   Periphery Attached, fibrotic NV IT, early NV nasal periphery, good segmental PRP changes surrounding both areas, no new NV/heme Retina attached over buckle, good buckle height, good laser over buckle, tractional fibrosis improved, no heme PRE-OP: total TRD greatest nasal and inferior quads; fibrotic NVE nasal and IT periphery; ?stretch hole at 0800           Refraction     Wearing Rx       Sphere Cylinder Axis Add   Right +1.25 +0.25 086 +2.50   Left +1.25 +0.25 093 +2.50           IMAGING AND PROCEDURES  Imaging and Procedures for 07/14/2023  OCT, Retina - OU - Both Eyes       Right  Eye Quality was good. Central Foveal Thickness: 212. Progression has been stable. Findings include normal foveal contour, no IRF, no SRF, vitreomacular adhesion .   Left Eye Quality was good. Central Foveal Thickness: 234. Progression has improved. Findings include normal foveal contour, no IRF, no SRF, myopic contour, epiretinal membrane, macular pucker (Retina re-attached, patchy ORA with interval improvement in ellipsoid signal, stable ERM with pucker, mild IN schisis caught on widefield -- slightly improved).   Notes *Images captured and stored on drive  Diagnosis / Impression:  OD: NFP, no IRF/SRF OS: Retina re-attached, patchy ORA with interval improvement in ellipsoid signal, stable ERM with pucker, mild IN schisis caught on widefield -- slightly improved  Clinical management:  See below  Abbreviations: NFP - Normal foveal profile. CME - cystoid macular edema. PED - pigment epithelial detachment. IRF - intraretinal fluid. SRF - subretinal fluid. EZ - ellipsoid zone. ERM - epiretinal membrane. ORA - outer retinal atrophy. ORT - outer retinal tubulation. SRHM - subretinal hyper-reflective material. IRHM - intraretinal hyper-reflective material            ASSESSMENT/PLAN:   ICD-10-CM   1. Left retinal detachment  H33.22 OCT, Retina - OU - Both Eyes    2. 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 Sickle cell disease without crisis (HCC)  D57.1     4. Essential hypertension  I10     5. Hypertensive retinopathy of both eyes  H35.033 OCT, Retina - OU - Both Eyes    6. Diabetes mellitus type 2 without retinopathy (HCC)  E11.9     7. Long term (current) use of oral hypoglycemic drugs  Z79.84     8. Long-term (current) use of injectable non-insulin antidiabetic drugs  Z79.85     9. Combined forms of age-related cataract of right eye  H25.811     10. Pseudophakia of left eye  Z96.1      Retinal  detachment, OS - at presentation, pt reported ~1 month history of decreased vision OS - exam showed near total tractional detachment w/ ?rhegmatogenous component -- detachment greatest in nasal and inferior quadrants - focal areas of tractional fibrosis nasal and inferotemporal periphery w/ associated neovascularization  - retinal hole at 0800 - pt with history of sickle cell disease ([place]) -- retinopathy likely etiology of traction and fibrosis - s/p PPV/PFO/EL/FAX/14% C3F8 OS, 05.30.2024             - doing well - retina attached and in good position -- good buckle height and laser around breaks - gas bubble gone             - IOP 16  - BCVA OS - 20/80 from 20/100 -- s/p CEIOL OS w/ Dr. Dione Booze on 08.16.24  - off all drops now           - f/u 6-9 months -- DFE/OCT  2,3. 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 Hypertensive retinopathy OU - discussed importance of tight BP control - monitor  6-8. Diabetes mellitus, type 2 without retinopathy  - A1c 4.2 on 09.24.24  - FA (05.23.24) with no significant MA OU - The incidence, risk factors for progression, natural history and treatment options for diabetic retinopathy  were discussed with patient.   - The need for close monitoring of blood glucose, blood pressure, and serum lipids, avoiding cigarette or any type of tobacco, and the need for long term follow up was also discussed with patient. - f/u in 1 year, sooner prn  9. Mixed Cataract OD - The symptoms of cataract, surgical options, and treatments and risks were discussed with patient. - discussed diagnosis and progression - under the expert management of Dr. Dione Booze  10. Pseudophakia OS  - s/p CE/IOL OS (08.16.24, Dr. Dione Booze)  - IOL in good position, doing well  - monitor   Ophthalmic Meds Ordered  this visit:  No orders of the defined types were placed in this encounter.    Return for f/u 6-9 months, RD OS, DFE, OCT.  There are no Patient Instructions on file for this visit.  Explained the diagnoses, plan, and follow up with the patient and they expressed understanding.  Patient expressed understanding of the importance of proper follow up care.   This document serves as a record of services personally performed by Karie Chimera, MD, PhD. It was created on their behalf by Glee Arvin. Manson Passey, OA an ophthalmic technician. The creation of this record is the provider's dictation and/or activities during the visit.    Electronically signed by: Glee Arvin. Manson Passey, OA 07/15/23 12:39 AM  Karie Chimera, M.D., Ph.D. Diseases & Surgery of the Retina and Vitreous [facility]  I have reviewed the above documentation for accuracy and completeness, and I agree with the above. Karie Chimera, M.D., Ph.D. 07/15/23 12:41 AM  Abbreviations: M myopia (nearsighted); A astigmatism; H hyperopia (farsighted); P presbyopia; Mrx spectacle prescription;  CTL contact lenses; OD right eye; OS left eye; OU both eyes  XT exotropia; ET esotropia; PEK punctate epithelial keratitis; PEE punctate epithelial erosions; DES dry eye syndrome; MGD meibomian gland dysfunction; ATs artificial tears; PFAT's preservative free artificial tears; NSC nuclear sclerotic cataract; PSC posterior subcapsular cataract; ERM epi-retinal membrane; PVD posterior vitreous detachment; RD retinal detachment; DM diabetes mellitus; DR diabetic retinopathy; NPDR non-proliferative diabetic retinopathy; PDR proliferative diabetic retinopathy; CSME clinically significant macular edema; DME diabetic macular edema; dbh dot blot hemorrhages; CWS cotton wool spot; POAG primary open angle glaucoma; C/D cup-to-disc ratio; HVF humphrey visual field; GVF goldmann visual field; OCT optical coherence tomography; IOP intraocular pressure;  BRVO Branch retinal vein occlusion; CRVO central retinal vein occlusion; CRAO central retinal artery occlusion; BRAO branch retinal artery occlusion; RT retinal tear; SB scleral buckle; PPV pars plana vitrectomy; VH Vitreous hemorrhage; PRP panretinal laser photocoagulation; IVK intravitreal kenalog; VMT vitreomacular traction; MH Macular hole;  NVD neovascularization of the disc; NVE neovascularization elsewhere; AREDS age related eye disease study; ARMD age related macular degeneration; POAG primary open angle glaucoma; EBMD epithelial/anterior basement membrane dystrophy; ACIOL anterior chamber intraocular lens; IOL intraocular lens; PCIOL posterior chamber intraocular lens; Phaco/IOL phacoemulsification with intraocular lens placement; PRK photorefractive keratectomy; LASIK laser assisted in situ keratomileusis; HTN hypertension; DM diabetes mellitus; COPD chronic obstructive pulmonary disease

## 2023-07-15 ENCOUNTER — Other Ambulatory Visit: Payer: Self-pay | Admitting: Internal Medicine

## 2023-07-15 ENCOUNTER — Encounter (INDEPENDENT_AMBULATORY_CARE_PROVIDER_SITE_OTHER): Payer: Self-pay | Admitting: Ophthalmology

## 2023-07-27 ENCOUNTER — Other Ambulatory Visit: Payer: Self-pay

## 2023-07-27 ENCOUNTER — Encounter (HOSPITAL_COMMUNITY): Payer: Self-pay | Admitting: Emergency Medicine

## 2023-07-27 ENCOUNTER — Emergency Department (HOSPITAL_COMMUNITY)
Admission: EM | Admit: 2023-07-27 | Discharge: 2023-07-28 | Disposition: A | Discharge status: Home / Self Care | Payer: 59 | Source: Home / Self Care | Attending: Emergency Medicine | Admitting: Emergency Medicine

## 2023-07-27 DIAGNOSIS — Z833 Family history of diabetes mellitus: Secondary | ICD-10-CM | POA: Diagnosis not present

## 2023-07-27 DIAGNOSIS — N182 Chronic kidney disease, stage 2 (mild): Secondary | ICD-10-CM | POA: Insufficient documentation

## 2023-07-27 DIAGNOSIS — Z7984 Long term (current) use of oral hypoglycemic drugs: Secondary | ICD-10-CM | POA: Insufficient documentation

## 2023-07-27 DIAGNOSIS — I10 Essential (primary) hypertension: Secondary | ICD-10-CM | POA: Diagnosis not present

## 2023-07-27 DIAGNOSIS — I129 Hypertensive chronic kidney disease with stage 1 through stage 4 chronic kidney disease, or unspecified chronic kidney disease: Secondary | ICD-10-CM | POA: Insufficient documentation

## 2023-07-27 DIAGNOSIS — Z794 Long term (current) use of insulin: Secondary | ICD-10-CM | POA: Diagnosis not present

## 2023-07-27 DIAGNOSIS — Z79899 Other long term (current) drug therapy: Secondary | ICD-10-CM | POA: Diagnosis not present

## 2023-07-27 DIAGNOSIS — E1122 Type 2 diabetes mellitus with diabetic chronic kidney disease: Secondary | ICD-10-CM | POA: Insufficient documentation

## 2023-07-27 DIAGNOSIS — E785 Hyperlipidemia, unspecified: Secondary | ICD-10-CM | POA: Diagnosis not present

## 2023-07-27 DIAGNOSIS — G894 Chronic pain syndrome: Secondary | ICD-10-CM | POA: Diagnosis not present

## 2023-07-27 DIAGNOSIS — Z96641 Presence of right artificial hip joint: Secondary | ICD-10-CM | POA: Diagnosis not present

## 2023-07-27 DIAGNOSIS — D638 Anemia in other chronic diseases classified elsewhere: Secondary | ICD-10-CM | POA: Diagnosis not present

## 2023-07-27 DIAGNOSIS — D5700 Hb-SS disease with crisis, unspecified: Secondary | ICD-10-CM | POA: Insufficient documentation

## 2023-07-27 DIAGNOSIS — E876 Hypokalemia: Secondary | ICD-10-CM | POA: Diagnosis not present

## 2023-07-27 DIAGNOSIS — E1169 Type 2 diabetes mellitus with other specified complication: Secondary | ICD-10-CM | POA: Diagnosis not present

## 2023-07-27 DIAGNOSIS — Z7985 Long-term (current) use of injectable non-insulin antidiabetic drugs: Secondary | ICD-10-CM | POA: Diagnosis not present

## 2023-07-27 LAB — COMPREHENSIVE METABOLIC PANEL
ALT: 38 U/L (ref 0–44)
AST: 64 U/L — ABNORMAL HIGH (ref 15–41)
Albumin: 4.6 g/dL (ref 3.5–5.0)
Alkaline Phosphatase: 51 U/L (ref 38–126)
Anion gap: 10 (ref 5–15)
BUN: 21 mg/dL — ABNORMAL HIGH (ref 6–20)
CO2: 29 mmol/L (ref 22–32)
Calcium: 9.2 mg/dL (ref 8.9–10.3)
Chloride: 101 mmol/L (ref 98–111)
Creatinine, Ser: 1.68 mg/dL — ABNORMAL HIGH (ref 0.61–1.24)
GFR, Estimated: 50 mL/min — ABNORMAL LOW (ref >60)
Glucose, Bld: 123 mg/dL — ABNORMAL HIGH (ref 70–99)
Potassium: 4 mmol/L (ref 3.5–5.1)
Sodium: 140 mmol/L (ref 135–145)
Total Bilirubin: 1.3 mg/dL — ABNORMAL HIGH (ref 0.3–1.2)
Total Protein: 8 g/dL (ref 6.5–8.1)

## 2023-07-27 LAB — CBC WITH DIFFERENTIAL/PLATELET
Abs Immature Granulocytes: 0.03 10*3/uL (ref 0.00–0.07)
Basophils Absolute: 0 10*3/uL (ref 0.0–0.1)
Basophils Relative: 0 %
Eosinophils Absolute: 0.3 10*3/uL (ref 0.0–0.5)
Eosinophils Relative: 3 %
HCT: 30.9 % — ABNORMAL LOW (ref 39.0–52.0)
Hemoglobin: 11.4 g/dL — ABNORMAL LOW (ref 13.0–17.0)
Immature Granulocytes: 0 %
Lymphocytes Relative: 16 %
Lymphs Abs: 1.6 10*3/uL (ref 0.7–4.0)
MCH: 30.6 pg (ref 26.0–34.0)
MCHC: 36.9 g/dL — ABNORMAL HIGH (ref 30.0–36.0)
MCV: 82.8 fL (ref 80.0–100.0)
Monocytes Absolute: 0.8 10*3/uL (ref 0.1–1.0)
Monocytes Relative: 8 %
Neutro Abs: 7.2 10*3/uL (ref 1.7–7.7)
Neutrophils Relative %: 73 %
Platelets: 231 10*3/uL (ref 150–400)
RBC: 3.73 MIL/uL — ABNORMAL LOW (ref 4.22–5.81)
RDW: 19.6 % — ABNORMAL HIGH (ref 11.5–15.5)
WBC: 10 10*3/uL (ref 4.0–10.5)
nRBC: 2.9 % — ABNORMAL HIGH (ref 0.0–0.2)

## 2023-07-27 LAB — RETICULOCYTES
Immature Retic Fract: 40.4 % — ABNORMAL HIGH (ref 2.3–15.9)
RBC.: 3.72 MIL/uL — ABNORMAL LOW (ref 4.22–5.81)
Retic Count, Absolute: 171.5 10*3/uL (ref 19.0–186.0)
Retic Ct Pct: 4.6 % — ABNORMAL HIGH (ref 0.4–3.1)

## 2023-07-27 MED ORDER — HYDROMORPHONE HCL 1 MG/ML IJ SOLN
1.0000 mg | INTRAMUSCULAR | Status: AC
Start: 2023-07-28 — End: 2023-07-28
  Administered 2023-07-28: 1 mg via INTRAVENOUS
  Filled 2023-07-27: qty 1

## 2023-07-27 MED ORDER — HYDROMORPHONE HCL 1 MG/ML IJ SOLN
1.0000 mg | INTRAMUSCULAR | Status: AC
  Administered 2023-07-28: 1 mg via INTRAVENOUS
  Filled 2023-07-27: qty 1

## 2023-07-27 MED ORDER — KETOROLAC TROMETHAMINE 15 MG/ML IJ SOLN
15.0000 mg | INTRAMUSCULAR | Status: AC
  Administered 2023-07-27: 15 mg via INTRAVENOUS
  Filled 2023-07-27: qty 1

## 2023-07-27 MED ORDER — HYDROMORPHONE HCL 1 MG/ML IJ SOLN
1.0000 mg | INTRAMUSCULAR | Status: AC
  Administered 2023-07-27: 1 mg via INTRAVENOUS
  Filled 2023-07-27: qty 1

## 2023-07-27 MED ORDER — DEXTROSE-SODIUM CHLORIDE 5-0.45 % IV SOLN: INTRAVENOUS | Status: DC

## 2023-07-27 NOTE — ED Provider Notes (Signed)
EMERGENCY DEPARTMENT AT [facility] Provider Note  CSN: [id number] Arrival date & time: 07/27/23 2136  Chief Complaint(s) Sickle Cell Pain Crisis  HPI Nicolas [name] is a 47 y.o. male     Sickle Cell Pain Crisis Location:  Hip, back, L side and R side Onset quality:  Gradual Duration:  2 days Similar to previous crisis episodes: yes   Timing:  Constant Progression:  Worsening Chronicity:  Recurrent Context: cold exposure   Relieved by:  Nothing Worsened by:  Activity and movement Associated symptoms: no chest pain, no cough, no fever, no nausea, no swelling of legs and no vomiting    Patient has been taking home oxycodone with no relief.  Past Medical History Past Medical History:  Diagnosis Date   Arthritis    arthritis- back & L hip   Depression    situational depression, pt. 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   Acute chest syndrome (HCC)    HAP (hospital-acquired pneumonia)    Sickle cell disease (HCC) 09/22/2016   Sickle cell pain crisis (HCC) 09/22/2016   Elevated troponin 09/22/2016   AKI (acute kidney injury) (HCC) 09/22/2016   Sickle cell crisis (HCC) 09/22/2016   Bilateral shoulder pain 08/20/2015   Avascular necrosis (HCC) 08/20/2015   Acute on chronic anemia 05/11/2015   Plasmodium falciparum malaria    Malaria due to Plasmodium falciparum 05/10/2015   Type 2 diabetes mellitus with stage 2 chronic kidney disease, without long-term current use of insulin (HCC) 05/10/2015   Hypokalemia 05/10/2015   Dehydration 05/10/2015   Leukocytosis 05/10/2015   Elevated LFTs 05/10/2015    Proteinuria 11/09/2014   Vitamin D deficiency 08/02/2014   Chronic pain 08/01/2014   Hypertensive heart disease 07/26/2014   Obesity 07/26/2014   Sickle cell disease, type Long (HCC) 07/26/2014   Low back pain 11/03/2013   DKA, type 2 (HCC) 08/13/2013   Postoperative anemia due to acute blood loss 06/08/2012   Osteoarthritis of left hip 06/07/2012   Home Medication(s) Prior to Admission medications   Medication Sig Start Date End Date Taking? Authorizing Provider  amLODipine (NORVASC) 10 MG tablet Take 1 tablet (10 mg total) by mouth daily. 05/04/23  Yes Dorothyann Peng, MD  carvedilol (COREG) 6.25 MG tablet TAKE 1 TABLET BY MOUTH TWICE A DAY WITH FOOD 07/15/23  Yes Dorothyann Peng, MD  Cholecalciferol (VITAMIN D3) 2000 units capsule Take 2,000 Units by mouth daily. 02/05/16  Yes [provider]  folic acid (FOLVITE) 1 MG tablet Take 1 tablet (1 mg total) by mouth daily. Patient taking differently: Take 1 mg by mouth 2 (two) times daily. 06/10/22  Yes Massie Maroon, FNP  metFORMIN (GLUCOPHAGE) 500 MG tablet Take 1 tablet (500 mg total) by mouth 2 (two) times daily with a meal. 07/15/23  Yes Dorothyann Peng, MD  Multiple Vitamin (ONE-A-DAY MENS PO) Take 1 tablet by mouth daily with breakfast.   Yes [provider]  oxyCODONE-acetaminophen (PERCOCET) 7.5-325 MG tablet Take 1 tablet by mouth every 4 (four) hours as needed for severe pain. 06/26/22  Yes Dorothyann Peng, MD  OZEMPIC, 0.25 OR 0.5  MG/DOSE, 2 MG/1.5ML SOPN INJECT 0.5 MG INTO THE SKIN ONCE A WEEK. Patient taking differently: Inject 0.5 mg into the skin every Friday. 06/03/22  Yes Dorothyann Peng, MD  pravastatin (PRAVACHOL) 20 MG tablet TAKE 1 TABLET BY MOUTH EVERY DAY IN THE EVENING 04/28/23  Yes Dorothyann Peng, MD  tiZANidine (ZANAFLEX) 4 MG tablet Take 1 tablet (4 mg total) by mouth daily. 06/23/23 06/22/24 Yes Dorothyann Peng, MD  triamterene-hydrochlorothiazide [facility]) 37.5-25 MG tablet TAKE 1 TABLET BY MOUTH EVERY DAY  04/23/23  Yes Dorothyann Peng, MD  valsartan (DIOVAN) 160 MG tablet TAKE 1 TABLET BY MOUTH EVERY DAY 07/15/23  Yes Dorothyann Peng, MD  dorzolamide-timolol (COSOPT) 2-0.5 % ophthalmic solution Place 1 drop into the left eye daily. Patient not taking: Reported on 07/27/2023 04/22/23 04/21/24  [name], MD  doxycycline (MONODOX) 100 MG capsule Take 1 capsule (100 mg total) by mouth 2 (two) times daily. Patient not taking: Reported on 07/27/2023 02/16/23   Arnette Felts, FNP  glucose blood test strip Use as instructed Patient taking differently: 1 each by Other route every other day. Use as instructed 08/15/13   Viyuoh, Adeline C, MD  HYDROcodone-acetaminophen (NORCO/VICODIN) 5-325 MG tablet Take 1 tablet by mouth every 4 (four) hours as needed for moderate pain. Patient not taking: Reported on 07/27/2023 02/26/23 02/26/24  [name], MD  Lancets (ACCU-CHEK MULTICLIX) lancets Use as instructed Patient taking differently: 1 each by Other route every other day. 08/15/13   Viyuoh, Rolland Bimler, MD  prednisoLONE acetate (PRED FORTE) 1 % ophthalmic suspension Place 1 drop into the left eye 3 (three) times daily. Patient not taking: Reported on 07/27/2023 04/22/23   [name], MD                                                                                                                                    Allergies Patient has no known allergies.  Review of Systems Review of Systems  Constitutional:  Negative for fever.  Respiratory:  Negative for cough.   Cardiovascular:  Negative for chest pain.  Gastrointestinal:  Negative for nausea and vomiting.   As noted in HPI  Physical Exam Vital Signs  I have reviewed the triage vital signs BP 130/88   Pulse 94   Temp 98 F (36.7 C) (Oral)   Resp (!) 25   Ht 5\' 8"  (1.727 m)   Wt 102.1 kg   SpO2 95%   BMI 34.21 kg/m   Physical Exam Vitals reviewed.  Constitutional:      General: He is not in acute distress.    Appearance: He is  well-developed. He is not diaphoretic.  HENT:     Head: Normocephalic and atraumatic.     Right Ear: External ear normal.     Left Ear: External ear normal.     Nose: Nose normal.     Mouth/Throat:     Mouth: Mucous membranes are moist.  Eyes:     General: No scleral icterus.    Conjunctiva/sclera: Conjunctivae normal.  Neck:     Trachea: Phonation normal.  Cardiovascular:     Rate and Rhythm: Normal rate and regular rhythm.  Pulmonary:     Effort: Pulmonary effort is normal. No respiratory distress.     Breath sounds: No stridor.  Abdominal:     General: There is no distension.  Musculoskeletal:        General: Normal range of motion.     Cervical back: Normal range of motion.     Right lower leg: No edema.     Left lower leg: No edema.  Neurological:     Mental Status: He is alert and oriented to person, place, and time.  Psychiatric:        Behavior: Behavior normal.     ED Results and Treatments Labs (all labs ordered are listed, but only abnormal results are displayed) Labs Reviewed  COMPREHENSIVE METABOLIC PANEL - Abnormal; Notable for the following components:      Result Value   Glucose, Bld 123 (*)    BUN 21 (*)    Creatinine, Ser 1.68 (*)    AST 64 (*)    Total Bilirubin 1.3 (*)    GFR, Estimated 50 (*)    All other components within normal limits  CBC WITH DIFFERENTIAL/PLATELET - Abnormal; Notable for the following components:   RBC 3.73 (*)    Hemoglobin 11.4 (*)    HCT 30.9 (*)    MCHC 36.9 (*)    RDW 19.6 (*)    nRBC 2.9 (*)    All other components within normal limits  RETICULOCYTES - Abnormal; Notable for the following components:   Retic Ct Pct 4.6 (*)    RBC. 3.72 (*)    Immature Retic Fract 40.4 (*)    All other components within normal limits                                                                                                                         EKG  EKG Interpretation Date/Time:    Ventricular Rate:    PR Interval:     QRS Duration:    QT Interval:    QTC Calculation:   R Axis:      Text Interpretation:         Radiology No results found.  Medications Ordered in ED Medications  dextrose 5 % and 0.45 % NaCl infusion ( Intravenous New Bag/Given 07/27/23 2350)  ketorolac (TORADOL) 15 MG/ML injection 15 mg (15 mg Intravenous Given 07/27/23 2351)  HYDROmorphone (DILAUDID) injection 1 mg (1 mg Intravenous Given 07/27/23 2351)  HYDROmorphone (DILAUDID) injection 1 mg (1 mg Intravenous Given 07/28/23 0042)  HYDROmorphone (DILAUDID) injection 1 mg (1 mg Intravenous Given 07/28/23 0127)   Procedures Procedures  (including critical care time) Medical Decision Making / ED Course   Medical Decision Making Amount and/or Complexity of Data Reviewed Labs: ordered. Decision-making details  documented in ED Course.  Risk Prescription drug management. Parenteral controlled substances. Decision regarding hospitalization.    2 days of typical sickle cell pain in her lower back and bilateral hips. Fall or trauma. No lower extremity weakness or loss of sensation.  No swelling.  CBC without leukocytosis.  Stable hemoglobin at 11.4. Mild renal insufficiency.  Mildly elevated bilirubin up from baseline.  Provided with IV fluids and IV pain medicine.  Clinical Course as of 07/28/23 0327  Tue Jul 28, 2023  1610 Reports significant pain relief. Would like to go home to continue treatment with oral meds. [PC]    Clinical Course User Index [PC] Alsace Dowd, Amadeo Garnet, MD    Final Clinical Impression(s) / ED Diagnoses Final diagnoses:  Sickle cell anemia with pain [facility])   The patient appears reasonably screened and/or stabilized for discharge and I doubt any other medical condition or other [facility] requiring further screening, evaluation, or treatment in the ED at this time. I have discussed the findings, Dx and Tx plan with the patient/family who expressed understanding and agree(s) with the plan. Discharge  instructions discussed at length. The patient/family was given strict return precautions who verbalized understanding of the instructions. No further questions at time of discharge.  Disposition: Discharge  Condition: Good  ED Discharge Orders     None        Follow Up: Dorothyann Peng, MD [address] [place] [address] [phone]  Call  to schedule an appointment for close follow up    This chart was dictated using voice recognition software.  Despite best efforts to proofread,  errors can occur which can change the documentation meaning.    Nira Conn, MD 07/28/23 [phone]

## 2023-07-27 NOTE — ED Triage Notes (Signed)
  Patient comes in with bilateral hip pain from sickle cell crisis.  Patient states he has type [place] and usually can maintain pain at home but gets worse in colder weather.  States the pain started yesterday, 10/10, sharp.  Has been taking Oxy Q4 since last night with no relief.

## 2023-07-28 NOTE — ED Notes (Signed)
Pt was given apple juice and saltine and graham crackers per request.  Pt has warm blankets, family is at the bedside

## 2023-07-29 ENCOUNTER — Inpatient Hospital Stay (HOSPITAL_COMMUNITY)
Admission: EM | Admit: 2023-07-29 | Discharge: 2023-08-07 | DRG: 812 | Disposition: A | Discharge status: Home / Self Care | Payer: 59 | Attending: Internal Medicine | Admitting: Internal Medicine

## 2023-07-29 ENCOUNTER — Other Ambulatory Visit: Payer: Self-pay

## 2023-07-29 ENCOUNTER — Encounter (HOSPITAL_COMMUNITY): Payer: Self-pay | Admitting: Emergency Medicine

## 2023-07-29 ENCOUNTER — Other Ambulatory Visit: Payer: Self-pay | Admitting: Internal Medicine

## 2023-07-29 DIAGNOSIS — E785 Hyperlipidemia, unspecified: Secondary | ICD-10-CM | POA: Diagnosis present

## 2023-07-29 DIAGNOSIS — Z794 Long term (current) use of insulin: Secondary | ICD-10-CM | POA: Diagnosis not present

## 2023-07-29 DIAGNOSIS — D5700 Hb-SS disease with crisis, unspecified: Secondary | ICD-10-CM | POA: Diagnosis not present

## 2023-07-29 DIAGNOSIS — I10 Essential (primary) hypertension: Secondary | ICD-10-CM | POA: Diagnosis present

## 2023-07-29 DIAGNOSIS — Z7984 Long term (current) use of oral hypoglycemic drugs: Secondary | ICD-10-CM

## 2023-07-29 DIAGNOSIS — Z7985 Long-term (current) use of injectable non-insulin antidiabetic drugs: Secondary | ICD-10-CM

## 2023-07-29 DIAGNOSIS — Z79899 Other long term (current) drug therapy: Secondary | ICD-10-CM

## 2023-07-29 DIAGNOSIS — Z96641 Presence of right artificial hip joint: Secondary | ICD-10-CM | POA: Diagnosis present

## 2023-07-29 DIAGNOSIS — E876 Hypokalemia: Secondary | ICD-10-CM | POA: Diagnosis present

## 2023-07-29 DIAGNOSIS — G894 Chronic pain syndrome: Secondary | ICD-10-CM | POA: Diagnosis present

## 2023-07-29 DIAGNOSIS — D5709 Hb-ss disease with crisis with other specified complication: Principal | ICD-10-CM

## 2023-07-29 DIAGNOSIS — Z833 Family history of diabetes mellitus: Secondary | ICD-10-CM | POA: Diagnosis not present

## 2023-07-29 DIAGNOSIS — Z6834 Body mass index (BMI) 34.0-34.9, adult: Secondary | ICD-10-CM | POA: Diagnosis not present

## 2023-07-29 DIAGNOSIS — E1169 Type 2 diabetes mellitus with other specified complication: Secondary | ICD-10-CM | POA: Diagnosis present

## 2023-07-29 DIAGNOSIS — E669 Obesity, unspecified: Secondary | ICD-10-CM | POA: Diagnosis present

## 2023-07-29 DIAGNOSIS — D638 Anemia in other chronic diseases classified elsewhere: Secondary | ICD-10-CM | POA: Diagnosis present

## 2023-07-29 LAB — COMPREHENSIVE METABOLIC PANEL
ALT: 31 U/L (ref 0–44)
AST: 37 U/L (ref 15–41)
Albumin: 4.7 g/dL (ref 3.5–5.0)
Alkaline Phosphatase: 54 U/L (ref 38–126)
Anion gap: 12 (ref 5–15)
BUN: 24 mg/dL — ABNORMAL HIGH (ref 6–20)
CO2: 28 mmol/L (ref 22–32)
Calcium: 9.8 mg/dL (ref 8.9–10.3)
Chloride: 103 mmol/L (ref 98–111)
Creatinine, Ser: 1.43 mg/dL — ABNORMAL HIGH (ref 0.61–1.24)
GFR, Estimated: >60 mL/min (ref >60)
Glucose, Bld: 101 mg/dL — ABNORMAL HIGH (ref 70–99)
Potassium: 3.2 mmol/L — ABNORMAL LOW (ref 3.5–5.1)
Sodium: 143 mmol/L (ref 135–145)
Total Bilirubin: 1 mg/dL (ref 0.3–1.2)
Total Protein: 8.6 g/dL — ABNORMAL HIGH (ref 6.5–8.1)

## 2023-07-29 LAB — CBC WITH DIFFERENTIAL/PLATELET
Abs Immature Granulocytes: 0.04 10*3/uL (ref 0.00–0.07)
Basophils Absolute: 0 10*3/uL (ref 0.0–0.1)
Basophils Relative: 0 %
Eosinophils Absolute: 0.4 10*3/uL (ref 0.0–0.5)
Eosinophils Relative: 3 %
HCT: 32.3 % — ABNORMAL LOW (ref 39.0–52.0)
Hemoglobin: 11.7 g/dL — ABNORMAL LOW (ref 13.0–17.0)
Immature Granulocytes: 0 %
Lymphocytes Relative: 10 %
Lymphs Abs: 1.3 10*3/uL (ref 0.7–4.0)
MCH: 30.2 pg (ref 26.0–34.0)
MCHC: 36.2 g/dL — ABNORMAL HIGH (ref 30.0–36.0)
MCV: 83.5 fL (ref 80.0–100.0)
Monocytes Absolute: 0.8 10*3/uL (ref 0.1–1.0)
Monocytes Relative: 7 %
Neutro Abs: 10.1 10*3/uL — ABNORMAL HIGH (ref 1.7–7.7)
Neutrophils Relative %: 80 %
Platelets: 281 10*3/uL (ref 150–400)
RBC: 3.87 MIL/uL — ABNORMAL LOW (ref 4.22–5.81)
RDW: 19.9 % — ABNORMAL HIGH (ref 11.5–15.5)
WBC: 12.6 10*3/uL — ABNORMAL HIGH (ref 4.0–10.5)
nRBC: 1.7 % — ABNORMAL HIGH (ref 0.0–0.2)

## 2023-07-29 LAB — RETICULOCYTES
Immature Retic Fract: 31.7 % — ABNORMAL HIGH (ref 2.3–15.9)
RBC.: 3.81 MIL/uL — ABNORMAL LOW (ref 4.22–5.81)
Retic Count, Absolute: 173.4 10*3/uL (ref 19.0–186.0)
Retic Ct Pct: 4.6 % — ABNORMAL HIGH (ref 0.4–3.1)

## 2023-07-29 MED ORDER — PRAVASTATIN SODIUM 20 MG PO TABS
20.0000 mg | ORAL_TABLET | Freq: Every day | ORAL | Status: DC
  Administered 2023-07-30 – 2023-08-07 (×9): 20 mg via ORAL
  Filled 2023-07-29 (×9): qty 1

## 2023-07-29 MED ORDER — FOLIC ACID 1 MG PO TABS
1.0000 mg | ORAL_TABLET | Freq: Every day | ORAL | Status: DC
  Administered 2023-07-30 – 2023-08-07 (×9): 1 mg via ORAL
  Filled 2023-07-29 (×9): qty 1

## 2023-07-29 MED ORDER — POTASSIUM CHLORIDE CRYS ER 20 MEQ PO TBCR
40.0000 meq | EXTENDED_RELEASE_TABLET | Freq: Two times a day (BID) | ORAL | Status: AC
  Administered 2023-07-30 (×2): 40 meq via ORAL
  Filled 2023-07-29 (×2): qty 2

## 2023-07-29 MED ORDER — KETOROLAC TROMETHAMINE 15 MG/ML IJ SOLN
15.0000 mg | INTRAMUSCULAR | Status: AC
  Administered 2023-07-29: 15 mg via INTRAVENOUS
  Filled 2023-07-29: qty 1

## 2023-07-29 MED ORDER — AMLODIPINE BESYLATE 10 MG PO TABS
10.0000 mg | ORAL_TABLET | Freq: Every day | ORAL | Status: DC
  Administered 2023-07-30 – 2023-08-07 (×9): 10 mg via ORAL
  Filled 2023-07-29 (×8): qty 1
  Filled 2023-07-29: qty 2

## 2023-07-29 MED ORDER — HYDROMORPHONE HCL 2 MG/ML IJ SOLN
2.0000 mg | INTRAMUSCULAR | Status: AC
  Administered 2023-07-30: 2 mg via INTRAVENOUS
  Filled 2023-07-29: qty 1

## 2023-07-29 MED ORDER — HYDROMORPHONE HCL 2 MG/ML IJ SOLN
2.0000 mg | INTRAMUSCULAR | Status: DC
Start: 2023-07-29 — End: 2023-07-29

## 2023-07-29 MED ORDER — PROCHLORPERAZINE EDISYLATE 10 MG/2ML IJ SOLN: 5.0000 mg | Freq: Four times a day (QID) | INTRAMUSCULAR | Status: DC | PRN

## 2023-07-29 MED ORDER — ACETAMINOPHEN 325 MG PO TABS
650.0000 mg | ORAL_TABLET | Freq: Four times a day (QID) | ORAL | Status: DC | PRN
  Administered 2023-07-30 – 2023-08-06 (×12): 650 mg via ORAL
  Filled 2023-07-29 (×13): qty 2

## 2023-07-29 MED ORDER — IRBESARTAN 75 MG PO TABS
37.5000 mg | ORAL_TABLET | Freq: Every day | ORAL | Status: DC
  Administered 2023-07-30 – 2023-08-07 (×9): 37.5 mg via ORAL
  Filled 2023-07-29 (×7): qty 1
  Filled 2023-07-29: qty 0.5
  Filled 2023-07-29: qty 1

## 2023-07-29 MED ORDER — POLYETHYLENE GLYCOL 3350 17 G PO PACK
17.0000 g | PACK | Freq: Every day | ORAL | Status: DC | PRN
  Administered 2023-08-01: 17 g via ORAL
  Filled 2023-07-29: qty 1

## 2023-07-29 MED ORDER — CARVEDILOL 6.25 MG PO TABS
6.2500 mg | ORAL_TABLET | Freq: Two times a day (BID) | ORAL | Status: DC
  Administered 2023-07-30 – 2023-08-07 (×17): 6.25 mg via ORAL
  Filled 2023-07-29 (×10): qty 1
  Filled 2023-07-29: qty 2
  Filled 2023-07-29 (×6): qty 1

## 2023-07-29 MED ORDER — ENOXAPARIN SODIUM 40 MG/0.4ML IJ SOSY
40.0000 mg | PREFILLED_SYRINGE | INTRAMUSCULAR | Status: DC
  Administered 2023-07-30 – 2023-08-07 (×9): 40 mg via SUBCUTANEOUS
  Filled 2023-07-29 (×9): qty 0.4

## 2023-07-29 MED ORDER — HYDROMORPHONE HCL 2 MG/ML IJ SOLN
2.0000 mg | INTRAMUSCULAR | Status: AC
  Administered 2023-07-29: 2 mg via INTRAVENOUS
  Filled 2023-07-29: qty 1

## 2023-07-29 MED ORDER — SODIUM CHLORIDE 0.45 % IV SOLN: INTRAVENOUS | Status: AC

## 2023-07-29 MED ORDER — MELATONIN 5 MG PO TABS
5.0000 mg | ORAL_TABLET | Freq: Every evening | ORAL | Status: DC | PRN
  Administered 2023-08-01 – 2023-08-03 (×3): 5 mg via ORAL
  Filled 2023-07-29 (×3): qty 1

## 2023-07-29 NOTE — ED Triage Notes (Signed)
Patient arrives in wheelchair by POV c/o sickle cell crisis since Sunday. Reports pain across lower back and in right hip. States he was seen Monday however pain has been persistent and not getting any better. Last took oxy around noon today.

## 2023-07-29 NOTE — H&P (Signed)
History and Physical  Hanz Jeck QIO:[id number] DOB: 12/01/1975 DOA: 07/29/2023  Referring physician: Dr. Silverio Lay, EDP  PCP: Dorothyann Peng, MD  Outpatient Specialists: Sickle cell clinic. Patient coming from: Home.  Chief Complaint: Sickle cell pain crisis.  HPI: Nicolas [name] is a 46 y.o. male with medical history significant for sickle cell disease, hypertension, type 2 diabetes, hyperlipidemia, who presented to the ED with complaints of his typical sickle cell pain.  The patient was seen in the ED 2 days ago with the same.  Was discharged home on as needed opioid-based analgesics.  Endorses severe pain across his lower back and right hip.  Pain similar to previous sickle cell crisis.  No reported subjective fevers or chills.  In the ED, requiring several rounds of IV opiate based analgesics.  Due to persistent pain and concern for sickle cell crisis EDP requested admission.  Admitted by [facility], hospitalist service.  ED Course: Temperature 98.9.  BP 144/92, pulse 96, respiratory 16, saturation 95% on room air.  Lab studies notable for serum potassium 3.2, BUN 24, creatinine 1.43 at baseline.  WBC 12.6, hemoglobin 11.1, platelet count 281.  Review of Systems: Review of systems as noted in the HPI. All other systems reviewed and are negative.   Past Medical History:  Diagnosis Date   Arthritis    arthritis- back & L hip   Depression    situational depression, pt. out of work    Diabetes mellitus (HCC)    HTN (hypertension) 05/10/2015   Osteoarthritis of left hip 06/07/2012   Sickle cell anemia [facility])    Past Surgical History:  Procedure Laterality Date   IR GENERIC HISTORICAL  09/26/2016   IR FLUORO GUIDE CV LINE RIGHT 09/26/2016 Irish Lack, MD [facility]-INTERV RAD   IR GENERIC HISTORICAL  09/26/2016   IR US GUIDE VASC ACCESS RIGHT 09/26/2016 Irish Lack, MD [facility]INTER[facility]   JOINT REPLACEMENT Right 2012   R hip   PHOTOCOAGULATION WITH LASER Right 02/26/2023   Procedure:  PHOTOCOAGULATION WITH LASER;  Surgeon: [name], MD;  Location: [facility] OR;  Service: Ophthalmology;  Laterality: Right;   TOTAL HIP ARTHROPLASTY  06/07/2012   Procedure: TOTAL HIP ARTHROPLASTY;  Surgeon: Eulas Post, MD;  Location: [facility] OR;  Service: Orthopedics;  Laterality: Left;   VITRECTOMY 25 GAUGE WITH SCLERAL BUCKLE Left 02/26/2023   Procedure: VITRECTOMY 25 GAUGE WITH SCLERAL BUCKLE;  Surgeon: [name], MD;  Location: [facility] OR;  Service: Ophthalmology;  Laterality: Left;    Social History:  reports that he has never smoked. He has never used smokeless tobacco. He reports that he does not currently use alcohol. He reports that he does not use drugs.   No Known Allergies  Family History  Problem Relation Age of Onset   Diabetes Mother    Cancer - Other Father    Liver cancer Father    CAD Neg Hx       Prior to Admission medications   Medication Sig Start Date End Date Taking? Authorizing Provider  amLODipine (NORVASC) 10 MG tablet Take 1 tablet (10 mg total) by mouth daily. 05/04/23   Dorothyann Peng, MD  carvedilol (COREG) 6.25 MG tablet TAKE 1 TABLET BY MOUTH TWICE A DAY WITH FOOD 07/15/23   Dorothyann Peng, MD  Cholecalciferol (VITAMIN D3) 2000 units capsule Take 2,000 Units by mouth daily. 02/05/16   [provider]  dorzolamide-timolol (COSOPT) 2-0.5 % ophthalmic solution Place 1 drop into the left eye daily. Patient not taking: Reported on 07/27/2023 04/22/23  04/21/24  [name], MD  doxycycline (MONODOX) 100 MG capsule Take 1 capsule (100 mg total) by mouth 2 (two) times daily. Patient not taking: Reported on 07/27/2023 02/16/23   Arnette Felts, FNP  folic acid (FOLVITE) 1 MG tablet Take 1 tablet (1 mg total) by mouth daily. Patient taking differently: Take 1 mg by mouth 2 (two) times daily. 06/10/22   Massie Maroon, FNP  glucose blood test strip Use as instructed Patient taking differently: 1 each by Other route every other day. Use as instructed 08/15/13    Viyuoh, Adeline C, MD  HYDROcodone-acetaminophen (NORCO/VICODIN) 5-325 MG tablet Take 1 tablet by mouth every 4 (four) hours as needed for moderate pain. Patient not taking: Reported on 07/27/2023 02/26/23 02/26/24  [name], MD  Lancets (ACCU-CHEK MULTICLIX) lancets Use as instructed Patient taking differently: 1 each by Other route every other day. 08/15/13   Viyuoh, Rolland Bimler, MD  metFORMIN (GLUCOPHAGE) 500 MG tablet Take 1 tablet (500 mg total) by mouth 2 (two) times daily with a meal. 07/15/23   Dorothyann Peng, MD  Multiple Vitamin (ONE-A-DAY MENS PO) Take 1 tablet by mouth daily with breakfast.    [provider]  oxyCODONE-acetaminophen (PERCOCET) 7.5-325 MG tablet Take 1 tablet by mouth every 4 (four) hours as needed for severe pain. 06/26/22   Dorothyann Peng, MD  pravastatin (PRAVACHOL) 20 MG tablet TAKE 1 TABLET BY MOUTH EVERY DAY IN THE EVENING 04/28/23   Dorothyann Peng, MD  prednisoLONE acetate (PRED FORTE) 1 % ophthalmic suspension Place 1 drop into the left eye 3 (three) times daily. Patient not taking: Reported on 07/27/2023 04/22/23   [name], MD  Semaglutide,0.25 or 0.5MG /DOS, (OZEMPIC, 0.25 OR 0.5 MG/DOSE,) 2 MG/3ML SOPN INJECT 0.5 MG INTO THE SKIN ONCE A WEEK. 07/29/23   Dorothyann Peng, MD  tiZANidine (ZANAFLEX) 4 MG tablet TAKE 1 TABLET BY MOUTH EVERY DAY 07/29/23   Dorothyann Peng, MD  triamterene-hydrochlorothiazide [facility]) 37.5-25 MG tablet TAKE 1 TABLET BY MOUTH EVERY DAY 04/23/23   Dorothyann Peng, MD  valsartan (DIOVAN) 160 MG tablet TAKE 1 TABLET BY MOUTH EVERY DAY 07/15/23   Dorothyann Peng, MD    Physical Exam: BP (!) 144/92 (BP Location: Left Arm)   Pulse 96   Temp 98.9 F (37.2 C) (Oral)   Resp 16   Ht 5\' 8"  (1.727 m)   Wt 102.1 kg   SpO2 95%   BMI 34.21 kg/m   General: 47 y.o. year-old male well developed well nourished in no acute distress.  Alert and oriented x3. Cardiovascular: Regular rate and rhythm with no rubs or gallops.  No  thyromegaly or JVD noted.  No lower extremity edema. 2/4 pulses in all 4 extremities. Respiratory: Clear to auscultation with no wheezes or rales. Good inspiratory effort. Abdomen: Soft nontender nondistended with normal bowel sounds x4 quadrants. Muskuloskeletal: No cyanosis, clubbing or edema noted bilaterally Neuro: CN II-XII intact, strength, sensation, reflexes Skin: No ulcerative lesions noted or rashes Psychiatry: Judgement and insight appear normal. Mood is appropriate for condition and setting          Labs on Admission:  Basic Metabolic Panel: Recent Labs  Lab 07/27/23 2240 07/29/23 1833  NA 140 143  K 4.0 3.2*  CL 101 103  CO2 29 28  GLUCOSE 123* 101*  BUN 21* 24*  CREATININE 1.68* 1.43*  CALCIUM 9.2 9.8   Liver Function Tests: Recent Labs  Lab 07/27/23 2240 07/29/23 1833  AST 64* 37  ALT 38 31  ALKPHOS 51 54  BILITOT 1.3* 1.0  PROT 8.0 8.6*  ALBUMIN 4.6 4.7   No results for input(s): "LIPASE", "AMYLASE" in the last 168 hours. No results for input(s): "AMMONIA" in the last 168 hours. CBC: Recent Labs  Lab 07/27/23 2240 07/29/23 1833  WBC 10.0 12.6*  NEUTROABS 7.2 10.1*  HGB 11.4* 11.7*  HCT 30.9* 32.3*  MCV 82.8 83.5  PLT 231 281   Cardiac Enzymes: No results for input(s): "CKTOTAL", "CKMB", "CKMBINDEX", "TROPONINI" in the last 168 hours.  BNP (last 3 results) No results for input(s): "BNP" in the last 8760 hours.  ProBNP (last 3 results) No results for input(s): "PROBNP" in the last 8760 hours.  CBG: No results for input(s): "GLUCAP" in the last 168 hours.  Radiological Exams on Admission: No results found.  EKG: I independently viewed the EKG done and my findings are as followed: None available at the time of this visit.  Assessment/Plan Present on Admission:  Sickle cell pain crisis (HCC)  Principal Problem:   Sickle cell pain crisis (HCC)  Sickle cell pain crisis Unclear trigger Admitted for pain control IV opioid-based  analgesics IV fluid half-normal saline at 75 cc/h x 1 day  Hypokalemia Serum potassium 3.2 Repleted orally Repeat BMP in the morning Obtain magnesium level in the morning  Essential hypertension Resume home oral antihypertensives Monitor vital signs  Type 2 diabetes with hyperlipidemia Hold off home oral hypoglycemics Not hyperglycemic at this time Hold off subcu insulin  Hyperlipidemia Resume home pravastatin  Obesity BMI 34 Recommend weight loss outpatient with regular physical activity and healthy dieting.   Time: 75 minutes.   DVT prophylaxis: Subcu Lovenox daily.  Code Status: Full code.  Family Communication: None at bedside.  Disposition Plan: Admitted to telemetry unit.  Consults called: None.  Admission status: Inpatient status.   Status is: Inpatient The patient requires at least 2 midnights for further evaluation and treatment of present condition.   Darlin Drop MD [facility] Pager [phone]  If 7PM-7AM, please contact night-coverage [email or web address] Password [email or web address]  07/29/2023, 11:15 PM

## 2023-07-29 NOTE — ED Notes (Signed)
I made charge nurse patient stated "he back is hurting really bad"

## 2023-07-29 NOTE — ED Provider Notes (Signed)
[place] EMERGENCY DEPARTMENT AT [facility] Provider Note   CSN: [id number] Arrival date & time: 07/29/23  1754     History  Chief Complaint  Patient presents with   Sickle Cell Pain Crisis    Nicolas [name] is a 47 y.o. male hx of HTN, sickle cell anemia, here presenting with sickle cell pain crisis.  Patient was seen here 2 days ago.  Patient received IV pain medicine and pain improved initially.  Patient was sent home and several hours later his pain came back.  He states that the pain is mostly in the back and his hips.  He states that he has hip replacement already.  Patient denies any fevers or chest pain or shortness of breath.  Patient states that he is out of his oxycodone at home since his pharmacy ran out of it.  Patient follows up with [facility] sickle cell [facility]   The history is provided by the patient.       Home Medications Prior to Admission medications   Medication Sig Start Date End Date Taking? Authorizing Provider  amLODipine (NORVASC) 10 MG tablet Take 1 tablet (10 mg total) by mouth daily. 05/04/23   Dorothyann Peng, MD  carvedilol (COREG) 6.25 MG tablet TAKE 1 TABLET BY MOUTH TWICE A DAY WITH FOOD 07/15/23   Dorothyann Peng, MD  Cholecalciferol (VITAMIN D3) 2000 units capsule Take 2,000 Units by mouth daily. 02/05/16   [provider]  dorzolamide-timolol (COSOPT) 2-0.5 % ophthalmic solution Place 1 drop into the left eye daily. Patient not taking: Reported on 07/27/2023 04/22/23 04/21/24  [name], MD  doxycycline (MONODOX) 100 MG capsule Take 1 capsule (100 mg total) by mouth 2 (two) times daily. Patient not taking: Reported on 07/27/2023 02/16/23   Arnette Felts, FNP  folic acid (FOLVITE) 1 MG tablet Take 1 tablet (1 mg total) by mouth daily. Patient taking differently: Take 1 mg by mouth 2 (two) times daily. 06/10/22   Massie Maroon, FNP  glucose blood test strip Use as instructed Patient taking differently: 1 each by Other route  every other day. Use as instructed 08/15/13   Viyuoh, Adeline C, MD  HYDROcodone-acetaminophen (NORCO/VICODIN) 5-325 MG tablet Take 1 tablet by mouth every 4 (four) hours as needed for moderate pain. Patient not taking: Reported on 07/27/2023 02/26/23 02/26/24  [name], MD  Lancets (ACCU-CHEK MULTICLIX) lancets Use as instructed Patient taking differently: 1 each by Other route every other day. 08/15/13   Viyuoh, Rolland Bimler, MD  metFORMIN (GLUCOPHAGE) 500 MG tablet Take 1 tablet (500 mg total) by mouth 2 (two) times daily with a meal. 07/15/23   Dorothyann Peng, MD  Multiple Vitamin (ONE-A-DAY MENS PO) Take 1 tablet by mouth daily with breakfast.    [provider]  oxyCODONE-acetaminophen (PERCOCET) 7.5-325 MG tablet Take 1 tablet by mouth every 4 (four) hours as needed for severe pain. 06/26/22   Dorothyann Peng, MD  pravastatin (PRAVACHOL) 20 MG tablet TAKE 1 TABLET BY MOUTH EVERY DAY IN THE EVENING 04/28/23   Dorothyann Peng, MD  prednisoLONE acetate (PRED FORTE) 1 % ophthalmic suspension Place 1 drop into the left eye 3 (three) times daily. Patient not taking: Reported on 07/27/2023 04/22/23   [name], MD  Semaglutide,0.25 or 0.5MG /DOS, (OZEMPIC, 0.25 OR 0.5 MG/DOSE,) 2 MG/3ML SOPN INJECT 0.5 MG INTO THE SKIN ONCE A WEEK. 07/29/23   Dorothyann Peng, MD  tiZANidine (ZANAFLEX) 4 MG tablet TAKE 1 TABLET BY MOUTH EVERY DAY 07/29/23  Dorothyann Peng, MD  triamterene-hydrochlorothiazide [facility]) 37.5-25 MG tablet TAKE 1 TABLET BY MOUTH EVERY DAY 04/23/23   Dorothyann Peng, MD  valsartan (DIOVAN) 160 MG tablet TAKE 1 TABLET BY MOUTH EVERY DAY 07/15/23   Dorothyann Peng, MD      Allergies    Patient has no known allergies.    Review of Systems   Review of Systems  Musculoskeletal:  Positive for back pain.       Bilateral leg pain  All other systems reviewed and are negative.   Physical Exam Updated Vital Signs BP (!) 144/92 (BP Location: Left Arm)   Pulse 96   Temp 98.9 F  (37.2 C) (Oral)   Resp 16   Ht 5\' 8"  (1.727 m)   Wt 102.1 kg   SpO2 95%   BMI 34.21 kg/m  Physical Exam Vitals and nursing note reviewed.  Constitutional:      Comments: Uncomfortable  HENT:     Head: Normocephalic.     Nose: Nose normal.     Mouth/Throat:     Mouth: Mucous membranes are moist.  Eyes:     Extraocular Movements: Extraocular movements intact.     Pupils: Pupils are equal, round, and reactive to light.  Cardiovascular:     Rate and Rhythm: Normal rate and regular rhythm.     Pulses: Normal pulses.     Heart sounds: Normal heart sounds.  Pulmonary:     Effort: Pulmonary effort is normal.     Breath sounds: Normal breath sounds.  Abdominal:     General: Abdomen is flat.     Palpations: Abdomen is soft.  Musculoskeletal:        General: Normal range of motion.     Cervical back: Normal range of motion and neck supple.     Comments: Diffuse lower lumbar tenderness   Neurological:     Mental Status: He is alert.     ED Results / Procedures / Treatments   Labs (all labs ordered are listed, but only abnormal results are displayed) Labs Reviewed  COMPREHENSIVE METABOLIC PANEL - Abnormal; Notable for the following components:      Result Value   Potassium 3.2 (*)    Glucose, Bld 101 (*)    BUN 24 (*)    Creatinine, Ser 1.43 (*)    Total Protein 8.6 (*)    All other components within normal limits  RETICULOCYTES - Abnormal; Notable for the following components:   Retic Ct Pct 4.6 (*)    RBC. 3.81 (*)    Immature Retic Fract 31.7 (*)    All other components within normal limits  CBC WITH DIFFERENTIAL/PLATELET - Abnormal; Notable for the following components:   WBC 12.6 (*)    RBC 3.87 (*)    Hemoglobin 11.7 (*)    HCT 32.3 (*)    MCHC 36.2 (*)    RDW 19.9 (*)    nRBC 1.7 (*)    Neutro Abs 10.1 (*)    All other components within normal limits    EKG None  Radiology No results found.  Procedures Procedures    Medications Ordered in  ED Medications  ketorolac (TORADOL) 15 MG/ML injection 15 mg (has no administration in time range)  HYDROmorphone (DILAUDID) injection 2 mg (has no administration in time range)  HYDROmorphone (DILAUDID) injection 2 mg (has no administration in time range)  HYDROmorphone (DILAUDID) injection 2 mg (has no administration in time range)    ED Course/ Medical Decision Making/  A&P                                 Medical Decision Making Nicolas [name] is a 47 y.o. male here presenting with COVID help pain crisis.  Patient has leg and back pain.  Patient was seen here 2 days ago with similar symptoms.  Will order sickle cell order set with pain medicine and labs.  I do not think he has acute chest syndrome.  11:06 PM I reviewed patient's labs and reticulocyte count is elevated.  Hemoglobin is stable.  Patient received 2 doses of pain meds and still an 8 out of 10 pain.  Hospitalist to admit for sickle cell pain crisis.  Problems Addressed: Sickle cell disease with crisis and other complication [facility]): acute illness or injury  Amount and/or Complexity of Data Reviewed Labs: ordered. Decision-making details documented in ED Course.  Risk Prescription drug management.    Final Clinical Impression(s) / ED Diagnoses Final diagnoses:  None    Rx / DC Orders ED Discharge Orders     None         Charlynne Pander, MD 07/29/23 2307

## 2023-07-29 NOTE — ED Provider Triage Note (Signed)
Emergency Medicine Provider Triage Evaluation Note  Nicolas [name] , a 47 y.o. male  was evaluated in triage.  Pt complains of sickle cell pain.  Review of Systems  Positive:  Negative:   Physical Exam  BP (!) 153/104 (BP Location: Left Arm)   Pulse 100   Temp 98.6 F (37 C) (Oral)   Resp 18   Ht 5\' 8"  (1.727 m)   Wt 102.1 kg   SpO2 99%   BMI 34.21 kg/m  Gen:   Awake, no distress   Resp:  Normal effort  MSK:   Moves extremities without difficulty  Other:    Medical Decision Making  Medically screening exam initiated at 6:20 PM.  Appropriate orders placed.  Nicolas [name] was informed that the remainder of the evaluation will be completed by another provider, this initial triage assessment does not replace that evaluation, and the importance of remaining in the ED until their evaluation is complete.  Sickle cell pain crisis. Low back/right hip pain. Denies recent fall/trauma. Denies numbness/paresthesias in LE.    Denies fever, chest pain, dyspnea, cough, nausea, vomiting, diarrhea, abdominal pain, dysuria, hematuria, hematochezia.    Nicolas [name], [place] 07/29/23 Nicolas [name]

## 2023-07-30 DIAGNOSIS — D5700 Hb-SS disease with crisis, unspecified: Secondary | ICD-10-CM | POA: Diagnosis not present

## 2023-07-30 LAB — CBC
HCT: 29.3 % — ABNORMAL LOW (ref 39.0–52.0)
Hemoglobin: 10.7 g/dL — ABNORMAL LOW (ref 13.0–17.0)
MCH: 30.1 pg (ref 26.0–34.0)
MCHC: 36.5 g/dL — ABNORMAL HIGH (ref 30.0–36.0)
MCV: 82.5 fL (ref 80.0–100.0)
Platelets: 251 10*3/uL (ref 150–400)
RBC: 3.55 MIL/uL — ABNORMAL LOW (ref 4.22–5.81)
RDW: 19.4 % — ABNORMAL HIGH (ref 11.5–15.5)
WBC: 10.7 10*3/uL — ABNORMAL HIGH (ref 4.0–10.5)
nRBC: 2 % — ABNORMAL HIGH (ref 0.0–0.2)

## 2023-07-30 LAB — MAGNESIUM: Magnesium: 2.5 mg/dL — ABNORMAL HIGH (ref 1.7–2.4)

## 2023-07-30 LAB — GLUCOSE, CAPILLARY
Glucose-Capillary: 103 mg/dL — ABNORMAL HIGH (ref 70–99)
Glucose-Capillary: 96 mg/dL (ref 70–99)

## 2023-07-30 LAB — BASIC METABOLIC PANEL
Anion gap: 9 (ref 5–15)
BUN: 26 mg/dL — ABNORMAL HIGH (ref 6–20)
CO2: 30 mmol/L (ref 22–32)
Calcium: 9.3 mg/dL (ref 8.9–10.3)
Chloride: 103 mmol/L (ref 98–111)
Creatinine, Ser: 1.55 mg/dL — ABNORMAL HIGH (ref 0.61–1.24)
GFR, Estimated: 55 mL/min — ABNORMAL LOW (ref >60)
Glucose, Bld: 104 mg/dL — ABNORMAL HIGH (ref 70–99)
Potassium: 3.1 mmol/L — ABNORMAL LOW (ref 3.5–5.1)
Sodium: 142 mmol/L (ref 135–145)

## 2023-07-30 LAB — PHOSPHORUS: Phosphorus: 4 mg/dL (ref 2.5–4.6)

## 2023-07-30 MED ORDER — KETOROLAC TROMETHAMINE 15 MG/ML IJ SOLN
15.0000 mg | INTRAMUSCULAR | Status: AC
  Administered 2023-07-30: 15 mg via INTRAVENOUS
  Filled 2023-07-30: qty 1

## 2023-07-30 MED ORDER — SODIUM CHLORIDE 0.9% FLUSH: 9.0000 mL | INTRAVENOUS | Status: DC | PRN

## 2023-07-30 MED ORDER — OXYCODONE HCL 5 MG PO TABS
5.0000 mg | ORAL_TABLET | Freq: Four times a day (QID) | ORAL | Status: DC | PRN
  Administered 2023-07-30 – 2023-07-31 (×3): 5 mg via ORAL
  Filled 2023-07-30 (×3): qty 1

## 2023-07-30 MED ORDER — DIPHENHYDRAMINE HCL 25 MG PO CAPS
25.0000 mg | ORAL_CAPSULE | ORAL | Status: DC | PRN
  Administered 2023-07-30 – 2023-08-07 (×2): 25 mg via ORAL
  Filled 2023-07-30 (×3): qty 1

## 2023-07-30 MED ORDER — HYDROMORPHONE 1 MG/ML IV SOLN
INTRAVENOUS | Status: DC
  Administered 2023-07-30: 30 mg via INTRAVENOUS
  Administered 2023-07-30: 1.5 mg via INTRAVENOUS
  Administered 2023-07-31: 9.5 mg via INTRAVENOUS
  Administered 2023-07-31: 3 mg via INTRAVENOUS
  Administered 2023-07-31: 2 mg via INTRAVENOUS
  Administered 2023-07-31 (×3): 4 mg via INTRAVENOUS
  Administered 2023-08-01: 1.5 mg via INTRAVENOUS
  Administered 2023-08-01: 30 mg via INTRAVENOUS
  Administered 2023-08-01 (×2): 1.5 mg via INTRAVENOUS
  Administered 2023-08-02: 2.5 mg via INTRAVENOUS
  Administered 2023-08-02: 3.5 mg via INTRAVENOUS
  Administered 2023-08-02: 1 mg via INTRAVENOUS
  Administered 2023-08-02: 2.5 mg via INTRAVENOUS
  Administered 2023-08-02: 30 mg via INTRAVENOUS
  Administered 2023-08-02: 3.5 mg via INTRAVENOUS
  Administered 2023-08-02: 30 mg via INTRAVENOUS
  Administered 2023-08-03: 1.5 mg via INTRAVENOUS
  Administered 2023-08-03 (×2): 30 mg via INTRAVENOUS
  Administered 2023-08-04: 1.5 mg via INTRAVENOUS
  Administered 2023-08-04 (×2): 3.5 mg via INTRAVENOUS
  Administered 2023-08-05: 1.5 mg via INTRAVENOUS
  Administered 2023-08-05: 3 mg via INTRAVENOUS
  Filled 2023-07-30 (×4): qty 30

## 2023-07-30 MED ORDER — HYDROMORPHONE HCL 2 MG/ML IJ SOLN
2.0000 mg | INTRAMUSCULAR | Status: AC
  Administered 2023-07-30 (×3): 2 mg via INTRAVENOUS
  Filled 2023-07-30 (×3): qty 1

## 2023-07-30 MED ORDER — NALOXONE HCL 0.4 MG/ML IJ SOLN: 0.4000 mg | INTRAMUSCULAR | Status: DC | PRN

## 2023-07-30 MED ORDER — HYDROMORPHONE 1 MG/ML IV SOLN
INTRAVENOUS | Status: DC
  Administered 2023-07-30: 8 mg via INTRAVENOUS
  Administered 2023-07-30: 30 mg via INTRAVENOUS
  Filled 2023-07-30 (×3): qty 30

## 2023-07-30 NOTE — Progress Notes (Signed)
Subjective: Nicolas [name] is a 47 year old male with a medical history significant for sickle cell disease, type 2 DM, hyperlipidemia, and essential hypertension was admitted in sickle cell pain crisis.  Patient states that pain intensity is slowly improving.  He rates pain as 7/10 primarily to low back and lower extremities. Patient denies any dizziness, headache, chest pain, urinary symptoms, nausea, vomiting, or diarrhea.    Objective:  Vital signs in last 24 hours:  Vitals:   07/30/23 1209 07/30/23 1522 07/30/23 1615 07/30/23 1625  BP:  108/83  123/87  Pulse:  97  74  Resp: 17 18 16 16   Temp:  98.9 F (37.2 C)  98.4 F (36.9 C)  TempSrc:  Oral  Oral  SpO2: 99% 95% 97% 100%  Weight:      Height:        Intake/Output from previous day:   Intake/Output Summary (Last 24 hours) at 07/30/2023 1721 Last data filed at 07/30/2023 0436 Gross per 24 hour  Intake 96.66 ml  Output --  Net 96.66 ml    Physical Exam: General: Alert, awake, oriented x3, in no acute distress.  HEENT: [place]/AT PEERL, EOMI Neck: Trachea midline,  no masses, no thyromegal,y no JVD, no carotid bruit OROPHARYNX:  Moist, No exudate/ erythema/lesions.  Heart: Regular rate and rhythm, without murmurs, rubs, gallops, PMI non-displaced, no heaves or thrills on palpation.  Lungs: Clear to auscultation, no wheezing or rhonchi noted. No increased vocal fremitus resonant to percussion  Abdomen: Soft, nontender, nondistended, positive bowel sounds, no masses no hepatosplenomegaly noted..  Neuro: No focal neurological deficits noted cranial nerves II through XII grossly intact. DTRs 2+ bilaterally upper and lower extremities. Strength 5 out of 5 in bilateral upper and lower extremities. Musculoskeletal: No warm swelling or erythema around joints, no spinal tenderness noted. Psychiatric: Patient alert and oriented x3, good insight and cognition, good recent to remote recall. Lymph node survey: No cervical axillary or  inguinal lymphadenopathy noted.  Lab Results:  Basic Metabolic Panel:    Component Value Date/Time   NA 142 07/30/2023 0521   NA 144 06/23/2023 1534   K 3.1 (L) 07/30/2023 0521   CL 103 07/30/2023 0521   CO2 30 07/30/2023 0521   BUN 26 (H) 07/30/2023 0521   BUN 22 06/23/2023 1534   CREATININE 1.55 (H) 07/30/2023 0521   GLUCOSE 104 (H) 07/30/2023 0521   CALCIUM 9.3 07/30/2023 0521   CBC:    Component Value Date/Time   WBC 10.7 (H) 07/30/2023 0521   HGB 10.7 (L) 07/30/2023 0521   HGB 12.2 (L) 06/04/2022 1458   HCT 29.3 (L) 07/30/2023 0521   HCT 36.7 (L) 06/04/2022 1458   PLT 251 07/30/2023 0521   PLT 448 06/04/2022 1458   MCV 82.5 07/30/2023 0521   MCV 89 06/04/2022 1458   NEUTROABS 10.1 (H) 07/29/2023 1833   NEUTROABS 5.7 06/04/2022 1458   LYMPHSABS 1.3 07/29/2023 1833   LYMPHSABS 2.6 06/04/2022 1458   MONOABS 0.8 07/29/2023 1833   EOSABS 0.4 07/29/2023 1833   EOSABS 0.2 06/04/2022 1458   BASOSABS 0.0 07/29/2023 1833   BASOSABS 0.1 06/04/2022 1458    No results found for this or any previous visit (from the past 240 hour(s)).  Studies/Results: No results found.  Medications: Scheduled Meds:  amLODipine  10 mg Oral Daily   carvedilol  6.25 mg Oral BID WC   enoxaparin (LOVENOX) injection  40 mg Subcutaneous Q24H   folic acid  1 mg Oral Daily  HYDROmorphone   Intravenous Q4H   irbesartan  37.5 mg Oral Daily   pravastatin  20 mg Oral Daily   Continuous Infusions:  sodium chloride 75 mL/hr at 07/30/23 0436   PRN Meds:.acetaminophen, diphenhydrAMINE, melatonin, naloxone **AND** sodium chloride flush, oxyCODONE, polyethylene glycol, prochlorperazine  Consultants: none  Procedures: none  Antibiotics: none  Assessment/Plan: Principal Problem:   Sickle cell pain crisis (HCC) Sickle cell disease with pain crisis: Continue IV Dilaudid PCA, decrease settings to 0.5 mg, 10-minute lockout, and 3 mg/h Continue home medication of oxycodone, 5 mg every 4 hours  for moderate to severe breakthrough pain. No Toradol due to chronic kidney disease. Continue IV fluids, 0.45% saline at 75 mL/h Monitor vital signs very closely, reevaluate pain scale regularly, and supplemental oxygen as needed  Chronic pain syndrome: Continue home medications  Anemia of chronic disease: Hemoglobin is stable and consistent with patient's baseline.  There is no clinical indication for blood transfusion at this time.  Monitor closely.  Essential hypertension: Stable.  Continue home medications  Hyperlipidemia: Continue home medications.  Heart healthy diet.  Type 2 diabetes mellitus: Serum creatinine elevated above baseline.  Hold metformin.  Consider SSI.  Labs in AM.  Carbohydrate modified/heart healthy diet.   Code Status: Full Code Family Communication: N/A Disposition Plan: Not yet ready for discharge  Nicolas [name] [name]  APRN, MSN, FNP-C [facility] [address] [place], [place] [address] [phone]  If 7PM-7AM, please contact night-coverage.  07/30/2023, 5:21 PM  LOS: 1 day

## 2023-07-31 LAB — CBC
HCT: 25.8 % — ABNORMAL LOW (ref 39.0–52.0)
Hemoglobin: 9.3 g/dL — ABNORMAL LOW (ref 13.0–17.0)
MCH: 29.5 pg (ref 26.0–34.0)
MCHC: 36 g/dL (ref 30.0–36.0)
MCV: 81.9 fL (ref 80.0–100.0)
Platelets: 241 10*3/uL (ref 150–400)
RBC: 3.15 MIL/uL — ABNORMAL LOW (ref 4.22–5.81)
RDW: 19.3 % — ABNORMAL HIGH (ref 11.5–15.5)
WBC: 9.7 10*3/uL (ref 4.0–10.5)
nRBC: 1.2 % — ABNORMAL HIGH (ref 0.0–0.2)

## 2023-07-31 LAB — BASIC METABOLIC PANEL
Anion gap: 12 (ref 5–15)
BUN: 29 mg/dL — ABNORMAL HIGH (ref 6–20)
CO2: 29 mmol/L (ref 22–32)
Calcium: 9.4 mg/dL (ref 8.9–10.3)
Chloride: 103 mmol/L (ref 98–111)
Creatinine, Ser: 1.25 mg/dL — ABNORMAL HIGH (ref 0.61–1.24)
GFR, Estimated: >60 mL/min (ref >60)
Glucose, Bld: 131 mg/dL — ABNORMAL HIGH (ref 70–99)
Potassium: 3.5 mmol/L (ref 3.5–5.1)
Sodium: 144 mmol/L (ref 135–145)

## 2023-07-31 LAB — GLUCOSE, CAPILLARY
Glucose-Capillary: 122 mg/dL — ABNORMAL HIGH (ref 70–99)
Glucose-Capillary: 132 mg/dL — ABNORMAL HIGH (ref 70–99)
Glucose-Capillary: 130 mg/dL — ABNORMAL HIGH (ref 70–99)

## 2023-07-31 MED ORDER — INSULIN ASPART 100 UNIT/ML IJ SOLN
0.0000 [IU] | Freq: Three times a day (TID) | INTRAMUSCULAR | Status: DC
  Administered 2023-07-31: 1 [IU] via SUBCUTANEOUS
  Administered 2023-08-01: 2 [IU] via SUBCUTANEOUS
  Administered 2023-08-01: 1 [IU] via SUBCUTANEOUS
  Administered 2023-08-02: 2 [IU] via SUBCUTANEOUS
  Administered 2023-08-02 – 2023-08-05 (×7): 1 [IU] via SUBCUTANEOUS
  Administered 2023-08-06: 2 [IU] via SUBCUTANEOUS
  Administered 2023-08-07: 1 [IU] via SUBCUTANEOUS

## 2023-07-31 NOTE — Progress Notes (Signed)
   07/31/23 1539  TOC Brief Assessment  Insurance and Status Reviewed  Patient has primary care physician Yes  Home environment has been reviewed Yes  Prior level of function: Independent  Prior/Current Home Services No current home services  Social Determinants of Health Reivew SDOH reviewed no interventions necessary  Readmission risk has been reviewed Yes  Transition of care needs no transition of care needs at this time

## 2023-07-31 NOTE — Progress Notes (Signed)
Patient CO2 alarm has been alarming periodically due to a high CO2 alarm. This RN has  seen  CO2 alarm on PCA be as high as 67. Hollis NP made aware and blood sugar checked and was 132. Patient oxygen level is 100% on 2L and pt denies shortness of breath.

## 2023-07-31 NOTE — Progress Notes (Signed)
Subjective: Nicolas [name] is a 47 year old male with a medical history significant for sickle cell disease, type 2 DM, hyperlipidemia, and essential hypertension was admitted in sickle cell pain crisis.  Patient states that pain intensity is slowly improving.  He rates pain as 7/10 primarily to low back and lower extremities.  Patient denies any dizziness, headache, chest pain, urinary symptoms, nausea, vomiting, or diarrhea.    Objective:  Vital signs in last 24 hours:  Vitals:   07/31/23 1102 07/31/23 1421 07/31/23 1646 07/31/23 1648  BP:  139/89  (!) 165/105  Pulse:  95  97  Resp: 19 14 20    Temp:  98.3 F (36.8 C)    TempSrc:  Oral    SpO2: 97% 100% 100%   Weight:      Height:        Intake/Output from previous day:   Intake/Output Summary (Last 24 hours) at 07/31/2023 1728 Last data filed at 07/31/2023 1000 Gross per 24 hour  Intake 480 ml  Output 300 ml  Net 180 ml    Physical Exam: General: Alert, awake, oriented x3, in no acute distress.  HEENT: /AT PEERL, EOMI Neck: Trachea midline,  no masses, no thyromegal,y no JVD, no carotid bruit OROPHARYNX:  Moist, No exudate/ erythema/lesions.  Heart: Regular rate and rhythm, without murmurs, rubs, gallops, PMI non-displaced, no heaves or thrills on palpation.  Lungs: Clear to auscultation, no wheezing or rhonchi noted. No increased vocal fremitus resonant to percussion  Abdomen: Soft, nontender, nondistended, positive bowel sounds, no masses no hepatosplenomegaly noted..  Neuro: No focal neurological deficits noted cranial nerves II through XII grossly intact. DTRs 2+ bilaterally upper and lower extremities. Strength 5 out of 5 in bilateral upper and lower extremities. Musculoskeletal: No warm swelling or erythema around joints, no spinal tenderness noted. Psychiatric: Patient alert and oriented x3, good insight and cognition, good recent to remote recall. Lymph node survey: No cervical axillary or inguinal  lymphadenopathy noted.  Lab Results:  Basic Metabolic Panel:    Component Value Date/Time   NA 144 07/31/2023 0750   NA 144 06/23/2023 1534   K 3.5 07/31/2023 0750   CL 103 07/31/2023 0750   CO2 29 07/31/2023 0750   BUN 29 (H) 07/31/2023 0750   BUN 22 06/23/2023 1534   CREATININE 1.25 (H) 07/31/2023 0750   GLUCOSE 131 (H) 07/31/2023 0750   CALCIUM 9.4 07/31/2023 0750   CBC:    Component Value Date/Time   WBC 9.7 07/31/2023 0750   HGB 9.3 (L) 07/31/2023 0750   HGB 12.2 (L) 06/04/2022 1458   HCT 25.8 (L) 07/31/2023 0750   HCT 36.7 (L) 06/04/2022 1458   PLT 241 07/31/2023 0750   PLT 448 06/04/2022 1458   MCV 81.9 07/31/2023 0750   MCV 89 06/04/2022 1458   NEUTROABS 10.1 (H) 07/29/2023 1833   NEUTROABS 5.7 06/04/2022 1458   LYMPHSABS 1.3 07/29/2023 1833   LYMPHSABS 2.6 06/04/2022 1458   MONOABS 0.8 07/29/2023 1833   EOSABS 0.4 07/29/2023 1833   EOSABS 0.2 06/04/2022 1458   BASOSABS 0.0 07/29/2023 1833   BASOSABS 0.1 06/04/2022 1458    No results found for this or any previous visit (from the past 240 hour(s)).  Studies/Results: No results found.  Medications: Scheduled Meds:  amLODipine  10 mg Oral Daily   carvedilol  6.25 mg Oral BID WC   enoxaparin (LOVENOX) injection  40 mg Subcutaneous Q24H   folic acid  1 mg Oral Daily   HYDROmorphone  Intravenous Q4H   irbesartan  37.5 mg Oral Daily   pravastatin  20 mg Oral Daily   Continuous Infusions:   PRN Meds:.acetaminophen, diphenhydrAMINE, melatonin, naloxone **AND** sodium chloride flush, oxyCODONE, polyethylene glycol, prochlorperazine  Consultants: none  Procedures: none  Antibiotics: none  Assessment/Plan: Principal Problem:   Sickle cell pain crisis (HCC) Sickle cell disease with pain crisis: Continue IV Dilaudid PCA Continue home medication of oxycodone, 5 mg every 4 hours for moderate to severe breakthrough pain. No Toradol due to chronic kidney disease. Continue IV fluids, 0.45% saline at  75 mL/h Monitor vital signs very closely, reevaluate pain scale regularly, and supplemental oxygen as needed  Chronic pain syndrome: Continue home medications  Anemia of chronic disease: Hemoglobin is stable and consistent with patient's baseline.  There is no clinical indication for blood transfusion at this time.  Monitor closely.  Essential hypertension: Stable.  Continue home medications  Hyperlipidemia: Continue home medications.  Heart healthy diet.  Type 2 diabetes mellitus: Serum creatinine elevated above baseline.  Hold metformin.  SSI.  Labs in AM.  Carbohydrate modified/heart healthy diet.   Code Status: Full Code Family Communication: N/A Disposition Plan: Not yet ready for discharge  Joie Reamer [name]  APRN, MSN, FNP-C [facility] [address] [place], [place] [address] [phone]  If 7PM-7AM, please contact night-coverage.  07/31/2023, 5:28 PM  LOS: 2 days

## 2023-07-31 NOTE — Plan of Care (Signed)

## 2023-08-01 DIAGNOSIS — D5700 Hb-SS disease with crisis, unspecified: Secondary | ICD-10-CM | POA: Diagnosis not present

## 2023-08-01 LAB — GLUCOSE, CAPILLARY
Glucose-Capillary: 184 mg/dL — ABNORMAL HIGH (ref 70–99)
Glucose-Capillary: 141 mg/dL — ABNORMAL HIGH (ref 70–99)
Glucose-Capillary: 138 mg/dL — ABNORMAL HIGH (ref 70–99)

## 2023-08-01 MED ORDER — POLYETHYLENE GLYCOL 3350 17 G PO PACK
17.0000 g | PACK | Freq: Two times a day (BID) | ORAL | Status: DC
  Administered 2023-08-01 – 2023-08-07 (×12): 17 g via ORAL
  Filled 2023-08-01 (×14): qty 1

## 2023-08-01 NOTE — Plan of Care (Signed)
  Problem: Education: Goal: Knowledge of vaso-occlusive preventative measures will improve Outcome: Progressing Goal: Awareness of infection prevention will improve Outcome: Progressing Goal: Awareness of signs and symptoms of anemia will improve Outcome: Progressing Goal: Long-term complications will improve Outcome: Progressing   Problem: Respiratory: Goal: Pulmonary complications will be avoided or minimized Outcome: Progressing Goal: Acute Chest Syndrome will be identified early to prevent complications Outcome: Progressing   Problem: Pain Management: Goal: General experience of comfort will improve Outcome: Progressing   Problem: Safety: Goal: Ability to remain free from injury will improve Outcome: Progressing

## 2023-08-01 NOTE — Progress Notes (Signed)
SICKLE CELL SERVICE PROGRESS NOTE  Nicolas [name] ZOX:[id number] DOB: 10-Feb-1976 DOA: 07/29/2023 PCP: Dorothyann Peng, MD  Assessment/Plan: Principal Problem:   Sickle cell pain crisis (HCC)  Sickle Cell Pain Crisis: Patient on Dilaudid PCA, oral oxycodone.  No Toradol due to chronic kidney disease.  Continue IV fluids.  Will continue to monitor Anemia of chronic disease: Continue monitoring H&H.  Hemoglobin stable at this point Type 2 diabetes: Continue sliding scale insulin.  Continue diabetic diet Chronic pain syndrome: Continue home medications. Hyperlipidemia: Continue home regimen  Code Status: Full code Family Communication: No family at bedside Disposition Plan: Home when ready  [facility]  Pager [phone]. If 7PM-7AM, please contact night-coverage.  08/01/2023, 3:25 PM  LOS: 3 days   Brief narrative: Nicolas [name] is a 47 year old male with a medical history significant for sickle cell disease, type 2 DM, hyperlipidemia, and essential hypertension was admitted in sickle cell pain crisis.  Patient states that pain intensity is slowly improving.  He rates pain as 7/10 primarily to low back and lower extremities.   Patient denies any dizziness, headache, chest pain, urinary symptoms, nausea, vomiting, or diarrhea.  Consultants: None  Procedures: None  Antibiotics: None  HPI/Subjective: Patient still has 7/10 in his back and legs. No fever, no NVD.  Objective: Vitals:   08/01/23 0619 08/01/23 1031 08/01/23 1236 08/01/23 1424  BP:  120/72  136/78  Pulse:  99  (!) 106  Resp: 17 20 20 14   Temp:  100.3 F (37.9 C)  99.4 F (37.4 C)  TempSrc:  Oral  Oral  SpO2: 100% 98% 98% 99%  Weight:      Height:       Weight change:   Intake/Output Summary (Last 24 hours) at 08/01/2023 1525 Last data filed at 08/01/2023 0900 Gross per 24 hour  Intake 440 ml  Output 1925 ml  Net -1485 ml    General: Alert, awake, oriented x3, in no acute distress.  HEENT: [place]/AT PEERL,  EOMI Neck: Trachea midline,  no masses, no thyromegal,y no JVD, no carotid bruit OROPHARYNX:  Moist, No exudate/ erythema/lesions.  Heart: Regular rate and rhythm, without murmurs, rubs, gallops, PMI non-displaced, no heaves or thrills on palpation.  Lungs: Clear to auscultation, no wheezing or rhonchi noted. No increased vocal fremitus resonant to percussion  Abdomen: Soft, nontender, nondistended, positive bowel sounds, no masses no hepatosplenomegaly noted..  Neuro: No focal neurological deficits noted cranial nerves II through XII grossly intact. DTRs 2+ bilaterally upper and lower extremities. Strength 5 out of 5 in bilateral upper and lower extremities. Musculoskeletal: No warm swelling or erythema around joints, no spinal tenderness noted. Psychiatric: Patient alert and oriented x3, good insight and cognition, good recent to remote recall. Lymph node survey: No cervical axillary or inguinal lymphadenopathy noted.   Data Reviewed: Basic Metabolic Panel: Recent Labs  Lab 07/27/23 2240 07/29/23 1833 07/30/23 0521 07/31/23 0750  NA 140 143 142 144  K 4.0 3.2* 3.1* 3.5  CL 101 103 103 103  CO2 29 28 30 29   GLUCOSE 123* 101* 104* 131*  BUN 21* 24* 26* 29*  CREATININE 1.68* 1.43* 1.55* 1.25*  CALCIUM 9.2 9.8 9.3 9.4  MG  --   --  2.5*  --   PHOS  --   --  4.0  --    Liver Function Tests: Recent Labs  Lab 07/27/23 2240 07/29/23 1833  AST 64* 37  ALT 38 31  ALKPHOS 51 54  BILITOT 1.3* 1.0  PROT 8.0 8.6*  ALBUMIN 4.6 4.7   No results for input(s): "LIPASE", "AMYLASE" in the last 168 hours. No results for input(s): "AMMONIA" in the last 168 hours. CBC: Recent Labs  Lab 07/27/23 2240 07/29/23 1833 07/30/23 0521 07/31/23 0750  WBC 10.0 12.6* 10.7* 9.7  NEUTROABS 7.2 10.1*  --   --   HGB 11.4* 11.7* 10.7* 9.3*  HCT 30.9* 32.3* 29.3* 25.8*  MCV 82.8 83.5 82.5 81.9  PLT 231 281 251 241   Cardiac Enzymes: No results for input(s): "CKTOTAL", "CKMB", "CKMBINDEX",  "TROPONINI" in the last 168 hours. BNP (last 3 results) No results for input(s): "BNP" in the last 8760 hours.  ProBNP (last 3 results) No results for input(s): "PROBNP" in the last 8760 hours.  CBG: Recent Labs  Lab 07/31/23 0741 07/31/23 1528 07/31/23 1811 08/01/23 0714 08/01/23 1259  GLUCAP 122* 132* 130* 184* 141*    No results found for this or any previous visit (from the past 240 hour(s)).   Studies: OCT, Retina - OU - Both Eyes  Result Date: 07/15/2023 Right Eye Quality was good. Central Foveal Thickness: 212. Progression has been stable. Findings include normal foveal contour, no IRF, no SRF, vitreomacular adhesion . Left Eye Quality was good. Central Foveal Thickness: 234. Progression has improved. Findings include normal foveal contour, no IRF, no SRF, myopic contour, epiretinal membrane, macular pucker (Retina re-attached, patchy ORA with interval improvement in ellipsoid signal, stable ERM with pucker, mild IN schisis caught on widefield -- slightly improved). Notes *Images captured and stored on drive Diagnosis / Impression: OD: NFP, no IRF/SRF OS: Retina re-attached, patchy ORA with interval improvement in ellipsoid signal, stable ERM with pucker, mild IN schisis caught on widefield -- slightly improved Clinical management: See below Abbreviations: NFP - Normal foveal profile. CME - cystoid macular edema. PED - pigment epithelial detachment. IRF - intraretinal fluid. SRF - subretinal fluid. EZ - ellipsoid zone. ERM - epiretinal membrane. ORA - outer retinal atrophy. ORT - outer retinal tubulation. SRHM - subretinal hyper-reflective material. IRHM - intraretinal hyper-reflective material    Scheduled Meds:  amLODipine  10 mg Oral Daily   carvedilol  6.25 mg Oral BID WC   enoxaparin (LOVENOX) injection  40 mg Subcutaneous Q24H   folic acid  1 mg Oral Daily   HYDROmorphone   Intravenous Q4H   insulin aspart  0-9 Units Subcutaneous TID WC   irbesartan  37.5 mg Oral Daily    polyethylene glycol  17 g Oral BID   pravastatin  20 mg Oral Daily   Continuous Infusions:  Principal Problem:   Sickle cell pain crisis (HCC)

## 2023-08-02 DIAGNOSIS — D5700 Hb-SS disease with crisis, unspecified: Secondary | ICD-10-CM | POA: Diagnosis not present

## 2023-08-02 LAB — CBC
HCT: 23.1 % — ABNORMAL LOW (ref 39.0–52.0)
Hemoglobin: 8.4 g/dL — ABNORMAL LOW (ref 13.0–17.0)
MCH: 29.8 pg (ref 26.0–34.0)
MCHC: 36.4 g/dL — ABNORMAL HIGH (ref 30.0–36.0)
MCV: 81.9 fL (ref 80.0–100.0)
Platelets: 285 10*3/uL (ref 150–400)
RBC: 2.82 MIL/uL — ABNORMAL LOW (ref 4.22–5.81)
RDW: 19.7 % — ABNORMAL HIGH (ref 11.5–15.5)
WBC: 12 10*3/uL — ABNORMAL HIGH (ref 4.0–10.5)
nRBC: 0.3 % — ABNORMAL HIGH (ref 0.0–0.2)

## 2023-08-02 LAB — COMPREHENSIVE METABOLIC PANEL
ALT: 32 U/L (ref 0–44)
AST: 32 U/L (ref 15–41)
Albumin: 3.5 g/dL (ref 3.5–5.0)
Alkaline Phosphatase: 50 U/L (ref 38–126)
Anion gap: 11 (ref 5–15)
BUN: 23 mg/dL — ABNORMAL HIGH (ref 6–20)
CO2: 30 mmol/L (ref 22–32)
Calcium: 9.2 mg/dL (ref 8.9–10.3)
Chloride: 107 mmol/L (ref 98–111)
Creatinine, Ser: 1.2 mg/dL (ref 0.61–1.24)
GFR, Estimated: >60 mL/min (ref >60)
Glucose, Bld: 130 mg/dL — ABNORMAL HIGH (ref 70–99)
Potassium: 3.7 mmol/L (ref 3.5–5.1)
Sodium: 148 mmol/L — ABNORMAL HIGH (ref 135–145)
Total Bilirubin: 1.2 mg/dL (ref 0.3–1.2)
Total Protein: 7.4 g/dL (ref 6.5–8.1)

## 2023-08-02 LAB — GLUCOSE, CAPILLARY
Glucose-Capillary: 128 mg/dL — ABNORMAL HIGH (ref 70–99)
Glucose-Capillary: 127 mg/dL — ABNORMAL HIGH (ref 70–99)
Glucose-Capillary: 166 mg/dL — ABNORMAL HIGH (ref 70–99)

## 2023-08-02 MED ORDER — CYCLOBENZAPRINE HCL 5 MG PO TABS
5.0000 mg | ORAL_TABLET | Freq: Three times a day (TID) | ORAL | Status: DC | PRN
  Administered 2023-08-03 – 2023-08-05 (×6): 5 mg via ORAL
  Filled 2023-08-02 (×6): qty 1

## 2023-08-02 NOTE — Plan of Care (Signed)
  Problem: Education: Goal: Awareness of infection prevention will improve Outcome: Progressing Goal: Awareness of signs and symptoms of anemia will improve Outcome: Progressing   Problem: Bowel/Gastric: Goal: Gut motility will be maintained Outcome: Progressing   Problem: Education: Goal: Knowledge of General Education information will improve Description: Including pain rating scale, medication(s)/side effects and non-pharmacologic comfort measures Outcome: Progressing   Problem: Health Behavior/Discharge Planning: Goal: Ability to manage health-related needs will improve Outcome: Progressing   Problem: Nutrition: Goal: Adequate nutrition will be maintained Outcome: Progressing   Problem: Elimination: Goal: Will not experience complications related to urinary retention Outcome: Progressing   Problem: Safety: Goal: Ability to remain free from injury will improve Outcome: Progressing   Problem: Skin Integrity: Goal: Risk for impaired skin integrity will decrease Outcome: Progressing   Problem: Sensory: Goal: Pain level will decrease with appropriate interventions Outcome: Not Progressing   Problem: Activity: Goal: Risk for activity intolerance will decrease Outcome: Not Progressing   Problem: Coping: Goal: Level of anxiety will decrease Outcome: Not Progressing   Problem: Pain Management: Goal: General experience of comfort will improve Outcome: Not Progressing

## 2023-08-02 NOTE — Progress Notes (Signed)
SICKLE CELL SERVICE PROGRESS NOTE  Real Nicolas [name]:[id number] DOB: 03/26/1976 DOA: 07/29/2023 PCP: Dorothyann Peng, MD  Assessment/Plan: Principal Problem:   Sickle cell pain crisis (HCC)  Sickle Cell Pain Crisis: Patient on Dilaudid PCA, oral oxycodone.  No Toradol due to chronic kidney disease.  Continue IV fluids.  Will continue to monitor.  Patient be given Flexeril for his back pain mainly the right hip area.  Pain is made in the area.  Hopefully he may be discharged in the morning with that. Anemia of chronic disease: Continue monitoring H&H.  Hemoglobin stable at this point Type 2 diabetes: Continue sliding scale insulin.  Continue diabetic diet Chronic pain syndrome: Continue home medications. Hyperlipidemia: Continue home regimen  Code Status: Full code Family Communication: No family at bedside Disposition Plan: Home when ready  [facility]  Pager [phone]. If 7PM-7AM, please contact night-coverage.  08/02/2023, 9:15 PM  LOS: 4 days   Brief narrative: Nicolas [name] is a 47 year old male with a medical history significant for sickle cell disease, type 2 DM, hyperlipidemia, and essential hypertension was admitted in sickle cell pain crisis.  Patient states that pain intensity is slowly improving.  He rates pain as 7/10 primarily to low back and lower extremities.   Patient denies any dizziness, headache, chest pain, urinary symptoms, nausea, vomiting, or diarrhea.  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  He described the pain as more spasms. Objective: Vitals:   08/02/23 1600 08/02/23 1855 08/02/23 1938 08/02/23 2112  BP:  (!) 159/86  (!) 146/74  Pulse:  (!) 111    Resp: 18 18 18 20   Temp:  (!) 103 F (39.4 C)  (!) 103 F (39.4 C)  TempSrc:  Oral  Oral  SpO2: 99% 95% 95% 100%  Weight:      Height:       Weight change:   Intake/Output Summary (Last  24 hours) at 08/02/2023 2115 Last data filed at 08/02/2023 1500 Gross per 24 hour  Intake 602.5 ml  Output 1275 ml  Net -672.5 ml    General: Alert, awake, oriented x3, in no acute distress.  HEENT: [place]/AT PEERL, EOMI Neck: Trachea midline,  no masses, no thyromegal,y no JVD, no carotid bruit OROPHARYNX:  Moist, No exudate/ erythema/lesions.  Heart: Sinus tachycardia, without murmurs, rubs, gallops, PMI non-displaced, no heaves or thrills on palpation.  Lungs: Clear to auscultation, no wheezing or rhonchi noted. No increased vocal fremitus resonant to percussion  Abdomen: Soft, nontender, nondistended, positive bowel sounds, no masses no hepatosplenomegaly noted..  Neuro: No focal neurological deficits noted cranial nerves II through XII grossly intact. DTRs 2+ bilaterally upper and lower extremities. Strength 5 out of 5 in bilateral upper and lower extremities. Musculoskeletal: No warm swelling or erythema around joints, no spinal tenderness noted. Psychiatric: Patient alert and oriented x3, good insight and cognition, good recent to remote recall. Lymph node survey: No cervical axillary or inguinal lymphadenopathy noted.   Data Reviewed: Basic Metabolic Panel: Recent Labs  Lab 07/27/23 2240 07/29/23 1833 07/30/23 0521 07/31/23 0750 08/02/23 1048  NA 140 143 142 144 148*  K 4.0 3.2* 3.1* 3.5 3.7  CL 101 103 103 103 107  CO2 29 28 30 29 30   GLUCOSE 123* 101* 104* 131* 130*  BUN 21* 24* 26* 29* 23*  CREATININE 1.68* 1.43* 1.55* 1.25* 1.20  CALCIUM 9.2 9.8 9.3 9.4 9.2  MG  --   --  2.5*  --   --   PHOS  --   --  4.0  --   --    Liver Function Tests: Recent Labs  Lab 07/27/23 2240 07/29/23 1833 08/02/23 1048  AST 64* 37 32  ALT 38 31 32  ALKPHOS 51 54 50  BILITOT 1.3* 1.0 1.2  PROT 8.0 8.6* 7.4  ALBUMIN 4.6 4.7 3.5   No results for input(s): "LIPASE", "AMYLASE" in the last 168 hours. No results for input(s): "AMMONIA" in the last 168 hours. CBC: Recent Labs  Lab  07/27/23 2240 07/29/23 1833 07/30/23 0521 07/31/23 0750 08/02/23 1048  WBC 10.0 12.6* 10.7* 9.7 12.0*  NEUTROABS 7.2 10.1*  --   --   --   HGB 11.4* 11.7* 10.7* 9.3* 8.4*  HCT 30.9* 32.3* 29.3* 25.8* 23.1*  MCV 82.8 83.5 82.5 81.9 81.9  PLT 231 281 251 241 285   Cardiac Enzymes: No results for input(s): "CKTOTAL", "CKMB", "CKMBINDEX", "TROPONINI" in the last 168 hours. BNP (last 3 results) No results for input(s): "BNP" in the last 8760 hours.  ProBNP (last 3 results) No results for input(s): "PROBNP" in the last 8760 hours.  CBG: Recent Labs  Lab 08/01/23 1259 08/01/23 1703 08/02/23 0711 08/02/23 1146 08/02/23 1610  GLUCAP 141* 138* 128* 127* 166*    No results found for this or any previous visit (from the past 240 hour(s)).   Studies: OCT, Retina - OU - Both Eyes  Result Date: 07/15/2023 Right Eye Quality was good. Central Foveal Thickness: 212. Progression has been stable. Findings include normal foveal contour, no IRF, no SRF, vitreomacular adhesion . Left Eye Quality was good. Central Foveal Thickness: 234. Progression has improved. Findings include normal foveal contour, no IRF, no SRF, myopic contour, epiretinal membrane, macular pucker (Retina re-attached, patchy ORA with interval improvement in ellipsoid signal, stable ERM with pucker, mild IN schisis caught on widefield -- slightly improved). Notes *Images captured and stored on drive Diagnosis / Impression: OD: NFP, no IRF/SRF OS: Retina re-attached, patchy ORA with interval improvement in ellipsoid signal, stable ERM with pucker, mild IN schisis caught on widefield -- slightly improved Clinical management: See below Abbreviations: NFP - Normal foveal profile. CME - cystoid macular edema. PED - pigment epithelial detachment. IRF - intraretinal fluid. SRF - subretinal fluid. EZ - ellipsoid zone. ERM - epiretinal membrane. ORA - outer retinal atrophy. ORT - outer retinal tubulation. SRHM - subretinal hyper-reflective  material. IRHM - intraretinal hyper-reflective material    Scheduled Meds:  amLODipine  10 mg Oral Daily   carvedilol  6.25 mg Oral BID WC   enoxaparin (LOVENOX) injection  40 mg Subcutaneous Q24H   folic acid  1 mg Oral Daily   HYDROmorphone   Intravenous Q4H   insulin aspart  0-9 Units Subcutaneous TID WC   irbesartan  37.5 mg Oral Daily   polyethylene glycol  17 g Oral BID   pravastatin  20 mg Oral Daily   Continuous Infusions:  Principal Problem:   Sickle cell pain crisis (HCC)

## 2023-08-03 ENCOUNTER — Other Ambulatory Visit (INDEPENDENT_AMBULATORY_CARE_PROVIDER_SITE_OTHER): Payer: 59 | Admitting: Pharmacist

## 2023-08-03 DIAGNOSIS — N182 Chronic kidney disease, stage 2 (mild): Secondary | ICD-10-CM

## 2023-08-03 DIAGNOSIS — D5700 Hb-SS disease with crisis, unspecified: Secondary | ICD-10-CM | POA: Diagnosis not present

## 2023-08-03 DIAGNOSIS — E1122 Type 2 diabetes mellitus with diabetic chronic kidney disease: Secondary | ICD-10-CM

## 2023-08-03 LAB — GLUCOSE, CAPILLARY
Glucose-Capillary: 109 mg/dL — ABNORMAL HIGH (ref 70–99)
Glucose-Capillary: 135 mg/dL — ABNORMAL HIGH (ref 70–99)
Glucose-Capillary: 127 mg/dL — ABNORMAL HIGH (ref 70–99)

## 2023-08-03 NOTE — Progress Notes (Signed)
Pharmacy Quality Measure Review  This patient is appearing on a report for being at risk of failing the Glycemic Status Assessment in Diabetes measure this calendar year.   Last A1c was at goal <7% (though A1c inaccurate, fructosamine does also correct to an A1c <7%).   A1c: 06/23/23: A1c 4.2%; frutosamine 244  CPT II code submitted.   Catie Eppie Gibson, PharmD, BCACP, CPP Clinical Pharmacist [facility] [phone]

## 2023-08-03 NOTE — Plan of Care (Signed)
  Problem: Education: Goal: Knowledge of vaso-occlusive preventative measures will improve Outcome: Progressing Goal: Awareness of infection prevention will improve Outcome: Progressing Goal: Awareness of signs and symptoms of anemia will improve Outcome: Progressing Goal: Long-term complications will improve Outcome: Progressing   Problem: Self-Care: Goal: Ability to incorporate actions that prevent/reduce pain crisis will improve Outcome: Progressing   Problem: Bowel/Gastric: Goal: Gut motility will be maintained Outcome: Progressing   Problem: Tissue Perfusion: Goal: Complications related to inadequate tissue perfusion will be avoided or minimized Outcome: Progressing   Problem: Respiratory: Goal: Pulmonary complications will be avoided or minimized Outcome: Progressing Goal: Acute Chest Syndrome will be identified early to prevent complications Outcome: Progressing   Problem: Fluid Volume: Goal: Ability to maintain a balanced intake and output will improve Outcome: Progressing   Problem: Sensory: Goal: Pain level will decrease with appropriate interventions Outcome: Progressing   Problem: Health Behavior: Goal: Postive changes in compliance with treatment and prescription regimens will improve Outcome: Progressing   Problem: Education: Goal: Knowledge of General Education information will improve Description: Including pain rating scale, medication(s)/side effects and non-pharmacologic comfort measures Outcome: Progressing   Problem: Health Behavior/Discharge Planning: Goal: Ability to manage health-related needs will improve Outcome: Progressing   Problem: Clinical Measurements: Goal: Ability to maintain clinical measurements within normal limits will improve Outcome: Progressing Goal: Will remain free from infection Outcome: Progressing Goal: Diagnostic test results will improve Outcome: Progressing Goal: Respiratory complications will improve Outcome:  Progressing Goal: Cardiovascular complication will be avoided Outcome: Progressing   Problem: Activity: Goal: Risk for activity intolerance will decrease Outcome: Progressing   Problem: Nutrition: Goal: Adequate nutrition will be maintained Outcome: Progressing   Problem: Coping: Goal: Level of anxiety will decrease Outcome: Progressing   Problem: Elimination: Goal: Will not experience complications related to bowel motility Outcome: Progressing Goal: Will not experience complications related to urinary retention Outcome: Progressing   Problem: Pain Management: Goal: General experience of comfort will improve Outcome: Progressing   Problem: Safety: Goal: Ability to remain free from injury will improve Outcome: Progressing   Problem: Skin Integrity: Goal: Risk for impaired skin integrity will decrease Outcome: Progressing   Problem: Education: Goal: Ability to describe self-care measures that may prevent or decrease complications (Diabetes Survival Skills Education) will improve Outcome: Progressing Goal: Individualized Educational Video(s) Outcome: Progressing   Problem: Coping: Goal: Ability to adjust to condition or change in health will improve Outcome: Progressing   Problem: Fluid Volume: Goal: Ability to maintain a balanced intake and output will improve Outcome: Progressing   Problem: Health Behavior/Discharge Planning: Goal: Ability to identify and utilize available resources and services will improve Outcome: Progressing Goal: Ability to manage health-related needs will improve Outcome: Progressing   Problem: Metabolic: Goal: Ability to maintain appropriate glucose levels will improve Outcome: Progressing   Problem: Nutritional: Goal: Maintenance of adequate nutrition will improve Outcome: Progressing Goal: Progress toward achieving an optimal weight will improve Outcome: Progressing   Problem: Skin Integrity: Goal: Risk for impaired skin  integrity will decrease Outcome: Progressing   Problem: Tissue Perfusion: Goal: Adequacy of tissue perfusion will improve Outcome: Progressing

## 2023-08-03 NOTE — Plan of Care (Signed)
  Problem: Education: Goal: Awareness of infection prevention will improve Outcome: Progressing   Problem: Fluid Volume: Goal: Ability to maintain a balanced intake and output will improve Outcome: Progressing   Problem: Elimination: Goal: Will not experience complications related to bowel motility Outcome: Progressing Goal: Will not experience complications related to urinary retention Outcome: Progressing   Problem: Education: Goal: Long-term complications will improve Outcome: Not Progressing   Problem: Sensory: Goal: Pain level will decrease with appropriate interventions Outcome: Not Progressing   Problem: Health Behavior: Goal: Postive changes in compliance with treatment and prescription regimens will improve Outcome: Not Progressing   Problem: Education: Goal: Knowledge of General Education information will improve Description: Including pain rating scale, medication(s)/side effects and non-pharmacologic comfort measures Outcome: Not Progressing   Problem: Health Behavior/Discharge Planning: Goal: Ability to manage health-related needs will improve Outcome: Not Progressing   Problem: Activity: Goal: Risk for activity intolerance will decrease Outcome: Not Progressing   Problem: Coping: Goal: Level of anxiety will decrease Outcome: Not Progressing   Problem: Pain Management: Goal: General experience of comfort will improve Outcome: Not Progressing

## 2023-08-04 DIAGNOSIS — D5700 Hb-SS disease with crisis, unspecified: Secondary | ICD-10-CM | POA: Diagnosis not present

## 2023-08-04 LAB — GLUCOSE, CAPILLARY
Glucose-Capillary: 119 mg/dL — ABNORMAL HIGH (ref 70–99)
Glucose-Capillary: 147 mg/dL — ABNORMAL HIGH (ref 70–99)
Glucose-Capillary: 145 mg/dL — ABNORMAL HIGH (ref 70–99)
Glucose-Capillary: 131 mg/dL — ABNORMAL HIGH (ref 70–99)

## 2023-08-04 NOTE — TOC Progression Note (Signed)
Transition of Care [facility]) - Progression Note    Patient Details  Name: Nicolas [name] MRN: [id number] Date of Birth: [birth year]  [facility]) CM/SW Contact  Darleene Cleaver, [place] Phone Number: 08/04/2023, 3:29 PM  Clinical Narrative:     No TOC needs at this time.       Expected Discharge Plan and Services                                               Social Determinants of Health (SDOH) Interventions SDOH Screenings   Food Insecurity: No Food Insecurity (07/30/2023)  Housing: Low Risk  (07/30/2023)  Transportation Needs: No Transportation Needs (07/30/2023)  Utilities: Not At Risk (07/30/2023)  Depression (PHQ2-9): Low Risk  (03/11/2023)  Financial Resource Strain: Low Risk  (03/11/2023)  Physical Activity: Sufficiently Active (03/11/2023)  Social Connections: Unknown (02/09/2022)   Received from [facility]  Stress: No Stress Concern Present (03/11/2023)  Tobacco Use: Low Risk  (07/29/2023)    Readmission Risk Interventions     No data to display

## 2023-08-04 NOTE — Progress Notes (Addendum)
Subjective: Nicolas [name] is a 47 year old male with a medical history significant for sickle cell disease, type 2 DM, hyperlipidemia, and essential hypertension was admitted in sickle cell pain crisis.  Patient states that pain intensity is slowly improving.  He rates pain as 7/10 primarily to low back and lower extremities.  Patient denies any dizziness, headache, chest pain, urinary symptoms, nausea, vomiting, or diarrhea.    Objective:  Vital signs in last 24 hours:  Vitals:   08/03/23 2032 08/03/23 2341 08/04/23 0207 08/04/23 0341  BP: 126/77  136/87   Pulse:   89   Resp: 20 18 18 18   Temp: (!) 102.8 F (39.3 C)  98.9 F (37.2 C)   TempSrc: Oral  Oral   SpO2: 91% 91% 95% 97%  Weight:      Height:        Intake/Output from previous day:   Intake/Output Summary (Last 24 hours) at 08/04/2023 0501 Last data filed at 08/03/2023 1930 Gross per 24 hour  Intake 240 ml  Output 750 ml  Net -510 ml    Physical Exam: General: Alert, awake, oriented x3, in no acute distress.  HEENT: [place]/AT PEERL, EOMI Neck: Trachea midline,  no masses, no thyromegal,y no JVD, no carotid bruit OROPHARYNX:  Moist, No exudate/ erythema/lesions.  Heart: Regular rate and rhythm, without murmurs, rubs, gallops, PMI non-displaced, no heaves or thrills on palpation.  Lungs: Clear to auscultation, no wheezing or rhonchi noted. No increased vocal fremitus resonant to percussion  Abdomen: Soft, nontender, nondistended, positive bowel sounds, no masses no hepatosplenomegaly noted..  Neuro: No focal neurological deficits noted cranial nerves II through XII grossly intact. DTRs 2+ bilaterally upper and lower extremities. Strength 5 out of 5 in bilateral upper and lower extremities. Musculoskeletal: No warm swelling or erythema around joints, no spinal tenderness noted. Psychiatric: Patient alert and oriented x3, good insight and cognition, good recent to remote recall. Lymph node survey: No cervical axillary or  inguinal lymphadenopathy noted.  Lab Results:  Basic Metabolic Panel:    Component Value Date/Time   NA 148 (H) 08/02/2023 1048   NA 144 06/23/2023 1534   K 3.7 08/02/2023 1048   CL 107 08/02/2023 1048   CO2 30 08/02/2023 1048   BUN 23 (H) 08/02/2023 1048   BUN 22 06/23/2023 1534   CREATININE 1.20 08/02/2023 1048   GLUCOSE 130 (H) 08/02/2023 1048   CALCIUM 9.2 08/02/2023 1048   CBC:    Component Value Date/Time   WBC 12.0 (H) 08/02/2023 1048   HGB 8.4 (L) 08/02/2023 1048   HGB 12.2 (L) 06/04/2022 1458   HCT 23.1 (L) 08/02/2023 1048   HCT 36.7 (L) 06/04/2022 1458   PLT 285 08/02/2023 1048   PLT 448 06/04/2022 1458   MCV 81.9 08/02/2023 1048   MCV 89 06/04/2022 1458   NEUTROABS 10.1 (H) 07/29/2023 1833   NEUTROABS 5.7 06/04/2022 1458   LYMPHSABS 1.3 07/29/2023 1833   LYMPHSABS 2.6 06/04/2022 1458   MONOABS 0.8 07/29/2023 1833   EOSABS 0.4 07/29/2023 1833   EOSABS 0.2 06/04/2022 1458   BASOSABS 0.0 07/29/2023 1833   BASOSABS 0.1 06/04/2022 1458    No results found for this or any previous visit (from the past 240 hour(s)).  Studies/Results: No results found.  Medications: Scheduled Meds:  amLODipine  10 mg Oral Daily   carvedilol  6.25 mg Oral BID WC   enoxaparin (LOVENOX) injection  40 mg Subcutaneous Q24H   folic acid  1 mg Oral  Daily   HYDROmorphone   Intravenous Q4H   insulin aspart  0-9 Units Subcutaneous TID WC   irbesartan  37.5 mg Oral Daily   polyethylene glycol  17 g Oral BID   pravastatin  20 mg Oral Daily   Continuous Infusions:   PRN Meds:.acetaminophen, cyclobenzaprine, diphenhydrAMINE, melatonin, naloxone **AND** sodium chloride flush, oxyCODONE, prochlorperazine  Consultants: none  Procedures: none  Antibiotics: none  Assessment/Plan: Principal Problem:   Sickle cell pain crisis (HCC) Sickle cell disease with pain crisis: Continue IV Dilaudid PCA Continue home medication of oxycodone, 5 mg every 4 hours for moderate to severe  breakthrough pain. No Toradol due to chronic kidney disease. Monitor vital signs very closely, reevaluate pain scale regularly, and supplemental oxygen as needed  Chronic pain syndrome: Continue home medications  Anemia of chronic disease: Hemoglobin is stable and consistent with patient's baseline.  There is no clinical indication for blood transfusion at this time.  Monitor closely.  Essential hypertension: Stable.  Continue home medications  Hyperlipidemia: Continue home medications.  Heart healthy diet.  Type 2 diabetes mellitus:  SSI.  Labs in AM.  Carbohydrate modified/heart healthy diet.   Code Status: Full Code Family Communication: N/A Disposition Plan: Not yet ready for discharge  Oneita Allmon [name]  APRN, MSN, FNP-C [facility] [address] [place], [place] [address] [phone]  If 7PM-7AM, please contact night-coverage.  08/04/2023, 5:01 AM  LOS: 6 days

## 2023-08-04 NOTE — Progress Notes (Signed)
Subjective: Nicolas [name] is a 47 year old male with a medical history significant for sickle cell disease, type 2 DM, hyperlipidemia, and essential hypertension was admitted in sickle cell pain crisis.   Patient has no new complaints on today.  He states that he is not ready for discharge at this time.  Patient states that pain intensity is slowly improving.  He rates pain as 7/10 primarily to low back and lower extremities.  Patient denies any dizziness, headache, chest pain, urinary symptoms, nausea, vomiting, or diarrhea.    Objective:  Vital signs in last 24 hours:  Vitals:   08/04/23 0626 08/04/23 0854 08/04/23 1004 08/04/23 1102  BP: (!) 128/92  (!) 152/85   Pulse: 99  93   Resp: 18 13 20 20   Temp: 100 F (37.8 C)  98.3 F (36.8 C)   TempSrc: Oral  Oral   SpO2: 90% 95% 97% 98%  Weight:      Height:        Intake/Output from previous day:   Intake/Output Summary (Last 24 hours) at 08/04/2023 1300 Last data filed at 08/04/2023 1114 Gross per 24 hour  Intake 1080 ml  Output 1025 ml  Net 55 ml    Physical Exam: General: Alert, awake, oriented x3, in no acute distress.  HEENT: [place]/AT [place]ER[place], EOMI Neck: Trachea midline,  no masses, no thyromegal,y no JVD, no carotid bruit OROPHARYNX:  Moist, No exudate/ erythema/lesions.  Heart: Regular rate and rhythm, without murmurs, rubs, gallops, PMI non-displaced, no heaves or thrills on palpation.  Lungs: Clear to auscultation, no wheezing or rhonchi noted. No increased vocal fremitus resonant to percussion  Abdomen: Soft, nontender, nondistended, positive bowel sounds, no masses no hepatosplenomegaly noted..  Neuro: No focal neurological deficits noted cranial nerves II through XII grossly intact. DTRs 2+ bilaterally upper and lower extremities. Strength 5 out of 5 in bilateral upper and lower extremities. Musculoskeletal: No warm swelling or erythema around joints, no spinal tenderness noted. Psychiatric: Patient alert and  oriented x3, good insight and cognition, good recent to remote recall. Lymph node survey: No cervical axillary or inguinal lymphadenopathy noted.  Lab Results:  Basic Metabolic Panel:    Component Value Date/Time   NA 148 (H) 08/02/2023 1048   NA 144 06/23/2023 1534   K 3.7 08/02/2023 1048   CL 107 08/02/2023 1048   CO2 30 08/02/2023 1048   BUN 23 (H) 08/02/2023 1048   BUN 22 06/23/2023 1534   CREATININE 1.20 08/02/2023 1048   GLUCOSE 130 (H) 08/02/2023 1048   CALCIUM 9.2 08/02/2023 1048   CBC:    Component Value Date/Time   WBC 12.0 (H) 08/02/2023 1048   HGB 8.4 (L) 08/02/2023 1048   HGB 12.2 (L) 06/04/2022 1458   HCT 23.1 (L) 08/02/2023 1048   HCT 36.7 (L) 06/04/2022 1458   PLT 285 08/02/2023 1048   PLT 448 06/04/2022 1458   MCV 81.9 08/02/2023 1048   MCV 89 06/04/2022 1458   NEUTROABS 10.1 (H) 07/29/2023 1833   NEUTROABS 5.7 06/04/2022 1458   LYMPHSABS 1.3 07/29/2023 1833   LYMPHSABS 2.6 06/04/2022 1458   MONOABS 0.8 07/29/2023 1833   EOSABS 0.4 07/29/2023 1833   EOSABS 0.2 06/04/2022 1458   BASOSABS 0.0 07/29/2023 1833   BASOSABS 0.1 06/04/2022 1458    No results found for this or any previous visit (from the past 240 hour(s)).  Studies/Results: No results found.  Medications: Scheduled Meds:  amLODipine  10 mg Oral Daily   carvedilol  6.25 mg Oral BID WC   enoxaparin (LOVENOX) injection  40 mg Subcutaneous Q24H   folic acid  1 mg Oral Daily   HYDROmorphone   Intravenous Q4H   insulin aspart  0-9 Units Subcutaneous TID WC   irbesartan  37.5 mg Oral Daily   polyethylene glycol  17 g Oral BID   pravastatin  20 mg Oral Daily   Continuous Infusions:   PRN Meds:.acetaminophen, cyclobenzaprine, diphenhydrAMINE, melatonin, naloxone **AND** sodium chloride flush, oxyCODONE, prochlorperazine  Consultants: none  Procedures: none  Antibiotics: none  Assessment/Plan: Principal Problem:   Sickle cell pain crisis (HCC) Sickle cell disease with pain  crisis: Continue IV Dilaudid PCA Continue home medication of oxycodone, 5 mg every 4 hours for moderate to severe breakthrough pain. No Toradol due to chronic kidney disease. Monitor vital signs very closely, reevaluate pain scale regularly, and supplemental oxygen as needed  Chronic pain syndrome: Continue home medications  Anemia of chronic disease: Hemoglobin is stable and consistent with patient's baseline.  There is no clinical indication for blood transfusion at this time.  Monitor closely.  Essential hypertension: Stable.  Continue home medications  Hyperlipidemia: Continue home medications.  Heart healthy diet.  Type 2 diabetes mellitus:  SSI.  Labs in AM.  Carbohydrate modified/heart healthy diet.   Code Status: Full Code Family Communication: N/A Disposition Plan: Not yet ready for discharge.  Discharge plan for 08/05/2019  Nolon Nations  APRN, MSN, FNP-C Patient Care [facility] [address] [place], [place] [address] [phone]  If 7PM-7AM, please contact night-coverage.  08/04/2023, 1:00 PM  LOS: 6 days

## 2023-08-04 NOTE — Plan of Care (Addendum)
MEWS Progress Note  Patient Details Name: Nicolas [name] MRN: [id number] DOB: [birth year] Today's Date: 08/04/2023   MEWS Flowsheet Documentation:  Assess: MEWS Score Temp: (!) 101.1 F (38.4 C) (tylenol admin) BP: (!) 161/103 MAP (mmHg): 120 Pulse Rate: (!) 110 ECG Heart Rate: (!) 106 Resp: 20 Level of Consciousness: Alert SpO2: 97 % O2 Device: Room Air Patient Activity (if Appropriate): In bed O2 Flow Rate (L/min): 0 L/min FiO2 (%): (!) 0 % Assess: MEWS Score MEWS Temp: 1 MEWS Systolic: 0 MEWS Pulse: 1 MEWS RR: 0 MEWS LOC: 0 MEWS Score: 2 MEWS Score Color: Yellow Assess: SIRS CRITERIA SIRS Temperature : 1 SIRS Respirations : 0 SIRS Pulse: 1 SIRS WBC: 0 SIRS Score Sum : 2 SIRS Temperature : 1 SIRS Pulse: 1 SIRS Respirations : 0 SIRS WBC: 0 SIRS Score Sum : 2        Kem Boroughs Jaramillo 08/04/2023, 6:21 PM   Problem: Education: Goal: Knowledge of vaso-occlusive preventative measures will improve Outcome: Progressing Goal: Awareness of infection prevention will improve Outcome: Progressing Goal: Awareness of signs and symptoms of anemia will improve Outcome: Progressing Goal: Long-term complications will improve Outcome: Progressing   Problem: Self-Care: Goal: Ability to incorporate actions that prevent/reduce pain crisis will improve Outcome: Progressing   Problem: Bowel/Gastric: Goal: Gut motility will be maintained Outcome: Progressing   Problem: Tissue Perfusion: Goal: Complications related to inadequate tissue perfusion will be avoided or minimized Outcome: Progressing   Problem: Respiratory: Goal: Pulmonary complications will be avoided or minimized Outcome: Progressing Goal: Acute Chest Syndrome will be identified early to prevent complications Outcome: Progressing   Problem: Fluid Volume: Goal: Ability to maintain a balanced intake and output will improve Outcome: Progressing   Problem: Sensory: Goal: Pain level will  decrease with appropriate interventions Outcome: Progressing   Problem: Health Behavior: Goal: Postive changes in compliance with treatment and prescription regimens will improve Outcome: Progressing   Problem: Education: Goal: Knowledge of General Education information will improve Description: Including pain rating scale, medication(s)/side effects and non-pharmacologic comfort measures Outcome: Progressing   Problem: Health Behavior/Discharge Planning: Goal: Ability to manage health-related needs will improve Outcome: Progressing   Problem: Clinical Measurements: Goal: Ability to maintain clinical measurements within normal limits will improve Outcome: Progressing Goal: Will remain free from infection Outcome: Progressing Goal: Diagnostic test results will improve Outcome: Progressing Goal: Respiratory complications will improve Outcome: Progressing Goal: Cardiovascular complication will be avoided Outcome: Progressing   Problem: Activity: Goal: Risk for activity intolerance will decrease Outcome: Progressing   Problem: Nutrition: Goal: Adequate nutrition will be maintained Outcome: Progressing   Problem: Coping: Goal: Level of anxiety will decrease Outcome: Progressing   Problem: Elimination: Goal: Will not experience complications related to bowel motility Outcome: Progressing Goal: Will not experience complications related to urinary retention Outcome: Progressing   Problem: Pain Management: Goal: General experience of comfort will improve Outcome: Progressing   Problem: Safety: Goal: Ability to remain free from injury will improve Outcome: Progressing   Problem: Skin Integrity: Goal: Risk for impaired skin integrity will decrease Outcome: Progressing   Problem: Education: Goal: Ability to describe self-care measures that may prevent or decrease complications (Diabetes Survival Skills Education) will improve Outcome: Progressing Goal: Individualized  Educational Video(s) Outcome: Progressing   Problem: Coping: Goal: Ability to adjust to condition or change in health will improve Outcome: Progressing   Problem: Fluid Volume: Goal: Ability to maintain a balanced intake and output will improve Outcome: Progressing   Problem: Health Behavior/Discharge Planning: Goal: Ability  to identify and utilize available resources and services will improve Outcome: Progressing Goal: Ability to manage health-related needs will improve Outcome: Progressing   Problem: Metabolic: Goal: Ability to maintain appropriate glucose levels will improve Outcome: Progressing   Problem: Nutritional: Goal: Maintenance of adequate nutrition will improve Outcome: Progressing Goal: Progress toward achieving an optimal weight will improve Outcome: Progressing   Problem: Skin Integrity: Goal: Risk for impaired skin integrity will decrease Outcome: Progressing   Problem: Tissue Perfusion: Goal: Adequacy of tissue perfusion will improve Outcome: Progressing  MEWS Progress Note  Patient Details Name: Nicolas [name] MRN: [id number] DOB: 08-Aug-1976 Today's Date: 08/04/2023   MEWS Flowsheet Documentation:  Assess: MEWS Score Temp: (!) 101.1 F (38.4 C) (tylenol admin) BP: (!) 161/103 MAP (mmHg): 120 Pulse Rate: (!) 110 ECG Heart Rate: (!) 106 Resp: 20 Level of Consciousness: Alert SpO2: 97 % O2 Device: Room Air Patient Activity (if Appropriate): In bed O2 Flow Rate (L/min): 0 L/min FiO2 (%): (!) 0 % Assess: MEWS Score MEWS Temp: 1 MEWS Systolic: 0 MEWS Pulse: 1 MEWS RR: 0 MEWS LOC: 0 MEWS Score: 2 MEWS Score Color: Yellow Assess: SIRS CRITERIA SIRS Temperature : 1 SIRS Respirations : 0 SIRS Pulse: 1 SIRS WBC: 0 SIRS Score Sum : 2 SIRS Temperature : 1 SIRS Pulse: 1 SIRS Respirations : 0 SIRS WBC: 0 SIRS Score Sum : 2        Kem Boroughs Jaramillo 08/04/2023, 6:21 PM

## 2023-08-05 DIAGNOSIS — D5700 Hb-SS disease with crisis, unspecified: Secondary | ICD-10-CM | POA: Diagnosis not present

## 2023-08-05 LAB — GLUCOSE, CAPILLARY
Glucose-Capillary: 130 mg/dL — ABNORMAL HIGH (ref 70–99)
Glucose-Capillary: 117 mg/dL — ABNORMAL HIGH (ref 70–99)
Glucose-Capillary: 120 mg/dL — ABNORMAL HIGH (ref 70–99)
Glucose-Capillary: 140 mg/dL — ABNORMAL HIGH (ref 70–99)

## 2023-08-05 LAB — CREATININE, SERUM
Creatinine, Ser: 1.27 mg/dL — ABNORMAL HIGH (ref 0.61–1.24)
GFR, Estimated: >60 mL/min (ref >60)

## 2023-08-05 MED ORDER — HYDROMORPHONE 1 MG/ML IV SOLN
INTRAVENOUS | Status: DC
  Administered 2023-08-05: 3 mg via INTRAVENOUS
  Administered 2023-08-05: 30 mg via INTRAVENOUS
  Administered 2023-08-05: 2 mg via INTRAVENOUS
  Administered 2023-08-05: 1 mg via INTRAVENOUS
  Administered 2023-08-06: 1.5 mg via INTRAVENOUS
  Administered 2023-08-06: 2.5 mg via INTRAVENOUS
  Administered 2023-08-06: 1.5 mg via INTRAVENOUS
  Administered 2023-08-06: 1 mg via INTRAVENOUS
  Administered 2023-08-06: 2 mg via INTRAVENOUS
  Administered 2023-08-06: 2.5 mg via INTRAVENOUS
  Administered 2023-08-07: 1.5 mg via INTRAVENOUS
  Administered 2023-08-07: 2.5 mg via INTRAVENOUS
  Administered 2023-08-07: 2 mg via INTRAVENOUS
  Administered 2023-08-07: 0.5 mg via INTRAVENOUS
  Administered 2023-08-07: 2.5 mg via INTRAVENOUS
  Filled 2023-08-05: qty 30

## 2023-08-05 MED ORDER — OXYCODONE HCL 5 MG PO TABS
5.0000 mg | ORAL_TABLET | ORAL | Status: DC
  Administered 2023-08-05 – 2023-08-07 (×12): 5 mg via ORAL
  Filled 2023-08-05 (×12): qty 1

## 2023-08-05 NOTE — Plan of Care (Signed)
  Problem: Education: Goal: Knowledge of vaso-occlusive preventative measures will improve Outcome: Progressing Goal: Awareness of infection prevention will improve Outcome: Progressing Goal: Awareness of signs and symptoms of anemia will improve Outcome: Progressing Goal: Long-term complications will improve Outcome: Progressing   Problem: Self-Care: Goal: Ability to incorporate actions that prevent/reduce pain crisis will improve Outcome: Progressing   Problem: Bowel/Gastric: Goal: Gut motility will be maintained Outcome: Progressing   Problem: Tissue Perfusion: Goal: Complications related to inadequate tissue perfusion will be avoided or minimized Outcome: Progressing   Problem: Respiratory: Goal: Pulmonary complications will be avoided or minimized Outcome: Progressing Goal: Acute Chest Syndrome will be identified early to prevent complications Outcome: Progressing   Problem: Fluid Volume: Goal: Ability to maintain a balanced intake and output will improve Outcome: Progressing   Problem: Sensory: Goal: Pain level will decrease with appropriate interventions Outcome: Progressing   Problem: Health Behavior: Goal: Postive changes in compliance with treatment and prescription regimens will improve Outcome: Progressing   Problem: Education: Goal: Knowledge of General Education information will improve Description: Including pain rating scale, medication(s)/side effects and non-pharmacologic comfort measures Outcome: Progressing   Problem: Health Behavior/Discharge Planning: Goal: Ability to manage health-related needs will improve Outcome: Progressing   Problem: Clinical Measurements: Goal: Ability to maintain clinical measurements within normal limits will improve Outcome: Progressing Goal: Will remain free from infection Outcome: Progressing Goal: Diagnostic test results will improve Outcome: Progressing Goal: Respiratory complications will improve Outcome:  Progressing Goal: Cardiovascular complication will be avoided Outcome: Progressing   Problem: Activity: Goal: Risk for activity intolerance will decrease Outcome: Progressing   Problem: Nutrition: Goal: Adequate nutrition will be maintained Outcome: Progressing   Problem: Coping: Goal: Level of anxiety will decrease Outcome: Progressing   Problem: Elimination: Goal: Will not experience complications related to bowel motility Outcome: Progressing Goal: Will not experience complications related to urinary retention Outcome: Progressing   Problem: Pain Management: Goal: General experience of comfort will improve Outcome: Progressing   Problem: Safety: Goal: Ability to remain free from injury will improve Outcome: Progressing   Problem: Skin Integrity: Goal: Risk for impaired skin integrity will decrease Outcome: Progressing   Problem: Education: Goal: Ability to describe self-care measures that may prevent or decrease complications (Diabetes Survival Skills Education) will improve Outcome: Progressing Goal: Individualized Educational Video(s) Outcome: Progressing   Problem: Coping: Goal: Ability to adjust to condition or change in health will improve Outcome: Progressing   Problem: Fluid Volume: Goal: Ability to maintain a balanced intake and output will improve Outcome: Progressing   Problem: Health Behavior/Discharge Planning: Goal: Ability to identify and utilize available resources and services will improve Outcome: Progressing Goal: Ability to manage health-related needs will improve Outcome: Progressing   Problem: Metabolic: Goal: Ability to maintain appropriate glucose levels will improve Outcome: Progressing   Problem: Nutritional: Goal: Maintenance of adequate nutrition will improve Outcome: Progressing Goal: Progress toward achieving an optimal weight will improve Outcome: Progressing   Problem: Skin Integrity: Goal: Risk for impaired skin  integrity will decrease Outcome: Progressing   Problem: Tissue Perfusion: Goal: Adequacy of tissue perfusion will improve Outcome: Progressing

## 2023-08-05 NOTE — Progress Notes (Signed)
Subjective: Nicolas [name] is a 47 year old male with a medical history significant for sickle cell disease, type 2 DM, hyperlipidemia, and essential hypertension was admitted in sickle cell pain crisis.   Patient has no new complaints on today.  He states that he is not ready for discharge at this time.  Patient states that pain intensity is slowly improving.  He rates pain as 7/10 primarily to low back and lower extremities.  Patient denies any dizziness, headache, chest pain, urinary symptoms, nausea, vomiting, or diarrhea.    Objective:  Vital signs in last 24 hours:  Vitals:   08/05/23 0010 08/05/23 0545 08/05/23 0547 08/05/23 0747  BP: (!) 145/92  (!) 150/88   Pulse: (!) 101  (!) 106   Resp: 19 20 16 16   Temp: (!) 102.4 F (39.1 C)  100.2 F (37.9 C)   TempSrc: Oral  Oral   SpO2: 94% 95% 95% 96%  Weight:      Height:        Intake/Output from previous day:   Intake/Output Summary (Last 24 hours) at 08/05/2023 1009 Last data filed at 08/05/2023 0545 Gross per 24 hour  Intake 480 ml  Output 2000 ml  Net -1520 ml    Physical Exam: General: Alert, awake, oriented x3, in no acute distress.  HEENT: [place]/AT [place], EOMI Neck: Trachea midline,  no masses, no thyromegal,y no JVD, no carotid bruit OROPHARYNX:  Moist, No exudate/ erythema/lesions.  Heart: Regular rate and rhythm, without murmurs, rubs, gallops, PMI non-displaced, no heaves or thrills on palpation.  Lungs: Clear to auscultation, no wheezing or rhonchi noted. No increased vocal fremitus resonant to percussion  Abdomen: Soft, nontender, nondistended, positive bowel sounds, no masses no hepatosplenomegaly noted..  Neuro: No focal neurological deficits noted cranial nerves II through XII grossly intact. DTRs 2+ bilaterally upper and lower extremities. Strength 5 out of 5 in bilateral upper and lower extremities. Musculoskeletal: No warm swelling or erythema around joints, no spinal tenderness noted. Psychiatric:  Patient alert and oriented x3, good insight and cognition, good recent to remote recall. Lymph node survey: No cervical axillary or inguinal lymphadenopathy noted.  Lab Results:  Basic Metabolic Panel:    Component Value Date/Time   NA 148 (H) 08/02/2023 1048   NA 144 06/23/2023 1534   K 3.7 08/02/2023 1048   CL 107 08/02/2023 1048   CO2 30 08/02/2023 1048   BUN 23 (H) 08/02/2023 1048   BUN 22 06/23/2023 1534   CREATININE 1.27 (H) 08/05/2023 0532   GLUCOSE 130 (H) 08/02/2023 1048   CALCIUM 9.2 08/02/2023 1048   CBC:    Component Value Date/Time   WBC 12.0 (H) 08/02/2023 1048   HGB 8.4 (L) 08/02/2023 1048   HGB 12.2 (L) 06/04/2022 1458   HCT 23.1 (L) 08/02/2023 1048   HCT 36.7 (L) 06/04/2022 1458   PLT 285 08/02/2023 1048   PLT 448 06/04/2022 1458   MCV 81.9 08/02/2023 1048   MCV 89 06/04/2022 1458   NEUTROABS 10.1 (H) 07/29/2023 1833   NEUTROABS 5.7 06/04/2022 1458   LYMPHSABS 1.3 07/29/2023 1833   LYMPHSABS 2.6 06/04/2022 1458   MONOABS 0.8 07/29/2023 1833   EOSABS 0.4 07/29/2023 1833   EOSABS 0.2 06/04/2022 1458   BASOSABS 0.0 07/29/2023 1833   BASOSABS 0.1 06/04/2022 1458    No results found for this or any previous visit (from the past 240 hour(s)).  Studies/Results: No results found.  Medications: Scheduled Meds:  amLODipine  10 mg Oral Daily  carvedilol  6.25 mg Oral BID WC   enoxaparin (LOVENOX) injection  40 mg Subcutaneous Q24H   folic acid  1 mg Oral Daily   HYDROmorphone   Intravenous Q4H   insulin aspart  0-9 Units Subcutaneous TID WC   irbesartan  37.5 mg Oral Daily   oxyCODONE  5 mg Oral Q4H while awake   polyethylene glycol  17 g Oral BID   pravastatin  20 mg Oral Daily   Continuous Infusions:   PRN Meds:.acetaminophen, cyclobenzaprine, diphenhydrAMINE, melatonin, naloxone **AND** sodium chloride flush, prochlorperazine  Consultants: none  Procedures: none  Antibiotics: none  Assessment/Plan: Principal Problem:   Sickle cell  pain crisis (HCC) Sickle cell disease with pain crisis: Continue IV Dilaudid PCA, weaning settings decreased  Continue home medication of oxycodone, 5 mg every 4 hours  while awake for moderate to severe breakthrough pain. No Toradol due to chronic kidney disease. Monitor vital signs very closely, reevaluate pain scale regularly, and supplemental oxygen as needed  Chronic pain syndrome: Continue home medications  Anemia of chronic disease: Hemoglobin is stable and consistent with patient's baseline.  There is no clinical indication for blood transfusion at this time.  Monitor closely.  Essential hypertension: Stable.  Continue home medications  Hyperlipidemia: Continue home medications.  Heart healthy diet.  Type 2 diabetes mellitus:  SSI.  Labs in AM.  Carbohydrate modified/heart healthy diet.   Code Status: Full Code Family Communication: N/A Disposition Plan: Not yet ready for discharge.  Discharge plan for 08/06/2019  Nolon Nations  APRN, MSN, FNP-C Patient Care [facility] [address] [place], [place] [address] [phone]  If 7PM-7AM, please contact night-coverage.  08/05/2023, 10:09 AM  LOS: 7 days

## 2023-08-06 DIAGNOSIS — D5700 Hb-SS disease with crisis, unspecified: Secondary | ICD-10-CM | POA: Diagnosis not present

## 2023-08-06 LAB — GLUCOSE, CAPILLARY
Glucose-Capillary: 105 mg/dL — ABNORMAL HIGH (ref 70–99)
Glucose-Capillary: 107 mg/dL — ABNORMAL HIGH (ref 70–99)
Glucose-Capillary: 151 mg/dL — ABNORMAL HIGH (ref 70–99)
Glucose-Capillary: 105 mg/dL — ABNORMAL HIGH (ref 70–99)
Glucose-Capillary: 169 mg/dL — ABNORMAL HIGH (ref 70–99)

## 2023-08-06 NOTE — Plan of Care (Signed)
  Problem: Education: Goal: Knowledge of vaso-occlusive preventative measures will improve Outcome: Progressing Goal: Awareness of infection prevention will improve Outcome: Progressing Goal: Awareness of signs and symptoms of anemia will improve Outcome: Progressing Goal: Long-term complications will improve Outcome: Progressing   Problem: Self-Care: Goal: Ability to incorporate actions that prevent/reduce pain crisis will improve Outcome: Progressing   Problem: Bowel/Gastric: Goal: Gut motility will be maintained Outcome: Progressing   Problem: Tissue Perfusion: Goal: Complications related to inadequate tissue perfusion will be avoided or minimized Outcome: Progressing   Problem: Respiratory: Goal: Pulmonary complications will be avoided or minimized Outcome: Progressing Goal: Acute Chest Syndrome will be identified early to prevent complications Outcome: Progressing   Problem: Fluid Volume: Goal: Ability to maintain a balanced intake and output will improve Outcome: Progressing   Problem: Sensory: Goal: Pain level will decrease with appropriate interventions Outcome: Progressing   Problem: Health Behavior: Goal: Postive changes in compliance with treatment and prescription regimens will improve Outcome: Progressing   Problem: Education: Goal: Knowledge of General Education information will improve Description: Including pain rating scale, medication(s)/side effects and non-pharmacologic comfort measures Outcome: Progressing   Problem: Health Behavior/Discharge Planning: Goal: Ability to manage health-related needs will improve Outcome: Progressing   Problem: Clinical Measurements: Goal: Ability to maintain clinical measurements within normal limits will improve Outcome: Progressing Goal: Will remain free from infection Outcome: Progressing Goal: Diagnostic test results will improve Outcome: Progressing Goal: Respiratory complications will improve Outcome:  Progressing Goal: Cardiovascular complication will be avoided Outcome: Progressing   Problem: Activity: Goal: Risk for activity intolerance will decrease Outcome: Progressing   Problem: Nutrition: Goal: Adequate nutrition will be maintained Outcome: Progressing   Problem: Coping: Goal: Level of anxiety will decrease Outcome: Progressing   Problem: Elimination: Goal: Will not experience complications related to bowel motility Outcome: Progressing Goal: Will not experience complications related to urinary retention Outcome: Progressing   Problem: Pain Management: Goal: General experience of comfort will improve Outcome: Progressing   Problem: Safety: Goal: Ability to remain free from injury will improve Outcome: Progressing   Problem: Skin Integrity: Goal: Risk for impaired skin integrity will decrease Outcome: Progressing   Problem: Education: Goal: Ability to describe self-care measures that may prevent or decrease complications (Diabetes Survival Skills Education) will improve Outcome: Progressing Goal: Individualized Educational Video(s) Outcome: Progressing   Problem: Coping: Goal: Ability to adjust to condition or change in health will improve Outcome: Progressing   Problem: Fluid Volume: Goal: Ability to maintain a balanced intake and output will improve Outcome: Progressing   Problem: Health Behavior/Discharge Planning: Goal: Ability to identify and utilize available resources and services will improve Outcome: Progressing Goal: Ability to manage health-related needs will improve Outcome: Progressing   Problem: Metabolic: Goal: Ability to maintain appropriate glucose levels will improve Outcome: Progressing   Problem: Nutritional: Goal: Maintenance of adequate nutrition will improve Outcome: Progressing Goal: Progress toward achieving an optimal weight will improve Outcome: Progressing   Problem: Skin Integrity: Goal: Risk for impaired skin  integrity will decrease Outcome: Progressing   Problem: Tissue Perfusion: Goal: Adequacy of tissue perfusion will improve Outcome: Progressing

## 2023-08-06 NOTE — Progress Notes (Signed)
Subjective: Nicolas [name] is a 47 year old male with a medical history significant for sickle cell disease, type 2 DM, hyperlipidemia, and essential hypertension was admitted in sickle cell pain crisis.   Patient has no new complaints on today.  He states that he is not ready for discharge at this time.  Patient states that pain intensity is slowly improving.  He rates pain as 7/10 primarily to low back and lower extremities.  Patient denies any dizziness, headache, chest pain, urinary symptoms, nausea, vomiting, or diarrhea.    Objective:  Vital signs in last 24 hours:  Vitals:   08/06/23 0630 08/06/23 0734 08/06/23 1008 08/06/23 1137  BP: (!) 157/90  (!) 148/87   Pulse: 93  (!) 101   Resp: 16 18 16 16   Temp: 98.4 F (36.9 C)  100 F (37.8 C)   TempSrc: Oral  Oral   SpO2: 94% 94% 91% 95%  Weight:      Height:        Intake/Output from previous day:   Intake/Output Summary (Last 24 hours) at 08/06/2023 1213 Last data filed at 08/06/2023 0900 Gross per 24 hour  Intake 240 ml  Output 1950 ml  Net -1710 ml    Physical Exam: General: Alert, awake, oriented x3, in no acute distress.  HEENT: [place]/AT PEERL, EOMI Neck: Trachea midline,  no masses, no thyromegal,y no JVD, no carotid bruit OROPHARYNX:  Moist, No exudate/ erythema/lesions.  Heart: Regular rate and rhythm, without murmurs, rubs, gallops, PMI non-displaced, no heaves or thrills on palpation.  Lungs: Clear to auscultation, no wheezing or rhonchi noted. No increased vocal fremitus resonant to percussion  Abdomen: Soft, nontender, nondistended, positive bowel sounds, no masses no hepatosplenomegaly noted..  Neuro: No focal neurological deficits noted cranial nerves II through XII grossly intact. DTRs 2+ bilaterally upper and lower extremities. Strength 5 out of 5 in bilateral upper and lower extremities. Musculoskeletal: No warm swelling or erythema around joints, no spinal tenderness noted. Psychiatric: Patient alert and  oriented x3, good insight and cognition, good recent to remote recall. Lymph node survey: No cervical axillary or inguinal lymphadenopathy noted.  Lab Results:  Basic Metabolic Panel:    Component Value Date/Time   NA 148 (H) 08/02/2023 1048   NA 144 06/23/2023 1534   K 3.7 08/02/2023 1048   CL 107 08/02/2023 1048   CO2 30 08/02/2023 1048   BUN 23 (H) 08/02/2023 1048   BUN 22 06/23/2023 1534   CREATININE 1.27 (H) 08/05/2023 0532   GLUCOSE 130 (H) 08/02/2023 1048   CALCIUM 9.2 08/02/2023 1048   CBC:    Component Value Date/Time   WBC 12.0 (H) 08/02/2023 1048   HGB 8.4 (L) 08/02/2023 1048   HGB 12.2 (L) 06/04/2022 1458   HCT 23.1 (L) 08/02/2023 1048   HCT 36.7 (L) 06/04/2022 1458   PLT 285 08/02/2023 1048   PLT 448 06/04/2022 1458   MCV 81.9 08/02/2023 1048   MCV 89 06/04/2022 1458   NEUTROABS 10.1 (H) 07/29/2023 1833   NEUTROABS 5.7 06/04/2022 1458   LYMPHSABS 1.3 07/29/2023 1833   LYMPHSABS 2.6 06/04/2022 1458   MONOABS 0.8 07/29/2023 1833   EOSABS 0.4 07/29/2023 1833   EOSABS 0.2 06/04/2022 1458   BASOSABS 0.0 07/29/2023 1833   BASOSABS 0.1 06/04/2022 1458    No results found for this or any previous visit (from the past 240 hour(s)).  Studies/Results: No results found.  Medications: Scheduled Meds:  amLODipine  10 mg Oral Daily  carvedilol  6.25 mg Oral BID WC   enoxaparin (LOVENOX) injection  40 mg Subcutaneous Q24H   folic acid  1 mg Oral Daily   HYDROmorphone   Intravenous Q4H   insulin aspart  0-9 Units Subcutaneous TID WC   irbesartan  37.5 mg Oral Daily   oxyCODONE  5 mg Oral Q4H while awake   polyethylene glycol  17 g Oral BID   pravastatin  20 mg Oral Daily   Continuous Infusions:   PRN Meds:.acetaminophen, cyclobenzaprine, diphenhydrAMINE, melatonin, naloxone **AND** sodium chloride flush, prochlorperazine  Consultants: none  Procedures: none  Antibiotics: none  Assessment/Plan: Principal Problem:   Sickle cell pain crisis  (HCC) Sickle cell disease with pain crisis: Continue IV Dilaudid PCA, weaning settings decreased  Continue home medication of oxycodone, 5 mg every 4 hours  while awake for moderate to severe breakthrough pain. No Toradol due to chronic kidney disease. Monitor vital signs very closely, reevaluate pain scale regularly, and supplemental oxygen as needed  Chronic pain syndrome: Continue home medications  Anemia of chronic disease: Hemoglobin is stable and consistent with patient's baseline.  There is no clinical indication for blood transfusion at this time.  Monitor closely.  Essential hypertension: Stable.  Continue home medications  Hyperlipidemia: Continue home medications.  Heart healthy diet.  Type 2 diabetes mellitus:  SSI.  Labs in AM.  Carbohydrate modified/heart healthy diet.   Code Status: Full Code Family Communication: N/A Disposition Plan: Not yet ready for discharge.  Discharge plan for 08/07/2019  Nolon Nations  APRN, MSN, FNP-C Patient Care [facility] [address] [place], [place] [address] [phone]  If 7PM-7AM, please contact night-coverage.  08/06/2023, 12:13 PM  LOS: 8 days

## 2023-08-07 DIAGNOSIS — D5700 Hb-SS disease with crisis, unspecified: Secondary | ICD-10-CM | POA: Diagnosis not present

## 2023-08-07 LAB — GLUCOSE, CAPILLARY
Glucose-Capillary: 110 mg/dL — ABNORMAL HIGH (ref 70–99)
Glucose-Capillary: 142 mg/dL — ABNORMAL HIGH (ref 70–99)

## 2023-08-07 NOTE — Discharge Summary (Signed)
Physician Discharge Summary  Nicolas [name] WUJ:[id number] DOB: Oct 11, 1975 DOA: 07/29/2023  PCP: Dorothyann Peng, MD  Admit date: 07/29/2023  Discharge date: 08/07/2023  Discharge Diagnoses:  Principal Problem:   Sickle cell pain crisis [facility])   Discharge Condition: Stable  Disposition:  Pt is discharged home in good condition and is to follow up with Dorothyann Peng, MD this week to have labs evaluated. Nicolas [name] is instructed to increase activity slowly and balance with rest for the next few days, and use prescribed medication to complete treatment of pain  Diet: Regular Wt Readings from Last 3 Encounters:  07/29/23 102.1 kg  07/27/23 102.1 kg  06/23/23 102.1 kg    History of present illness:  Nicolas [name] is a 47 year old male with a medical history significant for sickle cell disease, hypertension, type 2 diabetes mellitus, hyperlipidemia, who presented to the ED with complaints of back pain and bilateral hip pain.  The patient was evaluated in the ED 2 days ago with the same complaint.  He was discharged home on as needed opiate based analgesics.  Endorses severe pain across his lower back and hips, right greater than left hip.  Pain similar to previous sickle cell pain crises.  States that changes in weather or seasons trigger his sickle cell pain crisis.  No reported subjective fevers or chills. In the ED, requiring several rounds of IV opiate based analgesics.  Due to persistent pain and concern for sickle cell crisis EDP requested admission.  Admitted by [facility] hospitalist service. ED course: Temperature 98.9 F, BP 144/92, pulse 96, respirations 16, oxygen saturation 95% on room air.  Lab studies notable for serum potassium 3.2, BUN 24, creatinine 1.43 at baseline.  WBCs 12.6, hemoglobin 11.1, platelet count 281,000.  Hospital Course:  Sickle cell disease with pain crisis: Patient was admitted for sickle cell pain crisis and managed appropriately with IVF, IV Dilaudid via PCA   as well as other adjunct therapies per sickle cell pain management protocols.  IV Dilaudid PCA weaned appropriately and patient will resume his home medications.  His pain intensity is 5/10.  He states that he can manage at home current medication regimen.  He will follow-up with his PCP and his hematology team for medication management.  Of note, patient is followed by [facility] hematology.  Type 2 diabetes mellitus: Patient will resume his home pain medication regimen.  He will need to follow-up with PCP for fructosamine to evaluate diabetes further. Recommend carbohydrate modified diet.  Hypertension: Patient's blood pressure remained stable throughout admission.  Will continue home pain medication regimen without any changes.  Follow-up with PCP.  Patient was therefore discharged home today in a hemodynamically stable condition.   Nicolas [name] will follow-up with PCP within 1 week of this discharge. Nicolas [name] was counseled extensively about nonpharmacologic means of pain management, patient verbalized understanding and was appreciative of  the care received during this admission.   We discussed the need for good hydration, monitoring of hydration status, avoidance of heat, cold, stress, and infection triggers. Patient was reminded of the need to seek medical attention immediately if any symptom of bleeding, anemia, or infection occurs.  Discharge Exam: Vitals:   08/07/23 1006 08/07/23 1152  BP: (!) 155/89   Pulse: 99   Resp: (!) 21 19  Temp:    SpO2: 98% 97%   Vitals:   08/07/23 0424 08/07/23 0721 08/07/23 1006 08/07/23 1152  BP: (!) 152/73  (!) 155/89   Pulse: 89  99   Resp: 18  12 (!) 21 19  Temp: 100.3 F (37.9 C)     TempSrc: Oral     SpO2: 95% 97% 98% 97%  Weight:      Height:        General appearance : Awake, alert, not in any distress. Speech Clear. Not toxic looking HEENT: Atraumatic and Normocephalic, pupils equally reactive to light and accomodation Neck: Supple, no JVD.  No cervical lymphadenopathy.  Chest: Good air entry bilaterally, no added sounds  CVS: S1 S2 regular, no murmurs.  Abdomen: Bowel sounds present, Non tender and not distended with no gaurding, rigidity or rebound. Extremities: B/L Lower Ext shows no edema, both legs are warm to touch Neurology: Awake alert, and oriented X 3, CN II-XII intact, Non focal Skin: No Rash  Discharge Instructions  Discharge Instructions     Discharge patient   Complete by: As directed    Discharge disposition: 01-Home or Self Care   Discharge patient date: 08/07/2023      Allergies as of 08/07/2023   No Known Allergies      Medication List     TAKE these medications    accu-chek multiclix lancets Use as instructed What changed:  how much to take how to take this when to take this additional instructions   amLODipine 10 MG tablet Commonly known as: NORVASC Take 1 tablet (10 mg total) by mouth daily.   carvedilol 6.25 MG tablet Commonly known as: COREG TAKE 1 TABLET BY MOUTH TWICE A DAY WITH FOOD   dorzolamide-timolol 2-0.5 % ophthalmic solution Commonly known as: COSOPT Place 1 drop into the left eye daily.   doxycycline 100 MG capsule Commonly known as: MONODOX Take 1 capsule (100 mg total) by mouth 2 (two) times daily.   folic acid 1 MG tablet Commonly known as: FOLVITE Take 1 tablet (1 mg total) by mouth daily. What changed: when to take this   glucose blood test strip Use as instructed What changed:  how much to take how to take this when to take this   HYDROcodone-acetaminophen 5-325 MG tablet Commonly known as: NORCO/VICODIN Take 1 tablet by mouth every 4 (four) hours as needed for moderate pain.   lisinopril 10 MG tablet Commonly known as: ZESTRIL Take 10 mg by mouth daily.   metFORMIN 500 MG tablet Commonly known as: GLUCOPHAGE Take 1 tablet (500 mg total) by mouth 2 (two) times daily with a meal.   ONE-A-DAY MENS PO Take 1 tablet by mouth daily with  breakfast.   oxyCODONE-acetaminophen 7.5-325 MG tablet Commonly known as: PERCOCET Take 1 tablet by mouth every 4 (four) hours as needed for severe pain.   Ozempic (0.25 or 0.5 MG/DOSE) 2 MG/3ML Sopn Generic drug: Semaglutide(0.25 or 0.5MG /DOS) INJECT 0.5 MG INTO THE SKIN ONCE A WEEK. What changed: additional instructions   pravastatin 20 MG tablet Commonly known as: PRAVACHOL TAKE 1 TABLET BY MOUTH EVERY DAY IN THE EVENING   prednisoLONE acetate 1 % ophthalmic suspension Commonly known as: PRED FORTE Place 1 drop into the left eye 3 (three) times daily.   tiZANidine 4 MG tablet Commonly known as: ZANAFLEX TAKE 1 TABLET BY MOUTH EVERY DAY   triamterene-hydrochlorothiazide 37.5-25 MG tablet Commonly known as: MAXZIDE-25 TAKE 1 TABLET BY MOUTH EVERY DAY   valsartan 160 MG tablet Commonly known as: DIOVAN TAKE 1 TABLET BY MOUTH EVERY DAY   Vitamin D3 50 MCG (2000 UT) capsule Take 2,000 Units by mouth daily.        The results  of significant diagnostics from this hospitalization (including imaging, microbiology, ancillary and laboratory) are listed below for reference.    Significant Diagnostic Studies: OCT, Retina - OU - Both Eyes  Result Date: 07/15/2023 Right Eye Quality was good. Central Foveal Thickness: 212. Progression has been stable. Findings include normal foveal contour, no IRF, no SRF, vitreomacular adhesion . Left Eye Quality was good. Central Foveal Thickness: 234. Progression has improved. Findings include normal foveal contour, no IRF, no SRF, myopic contour, epiretinal membrane, macular pucker (Retina re-attached, patchy ORA with interval improvement in ellipsoid signal, stable ERM with pucker, mild IN schisis caught on widefield -- slightly improved). Notes *Images captured and stored on drive Diagnosis / Impression: OD: NFP, no IRF/SRF OS: Retina re-attached, patchy ORA with interval improvement in ellipsoid signal, stable ERM with pucker, mild IN schisis  caught on widefield -- slightly improved Clinical management: See below Abbreviations: NFP - Normal foveal profile. CME - cystoid macular edema. PED - pigment epithelial detachment. IRF - intraretinal fluid. SRF - subretinal fluid. EZ - ellipsoid zone. ERM - epiretinal membrane. ORA - outer retinal atrophy. ORT - outer retinal tubulation. SRHM - subretinal hyper-reflective material. IRHM - intraretinal hyper-reflective material    Microbiology: No results found for this or any previous visit (from the past 240 hour(s)).   Labs: Basic Metabolic Panel: Recent Labs  Lab 08/02/23 1048 08/05/23 0532  NA 148*  --   K 3.7  --   CL 107  --   CO2 30  --   GLUCOSE 130*  --   BUN 23*  --   CREATININE 1.20 1.27*  CALCIUM 9.2  --    Liver Function Tests: Recent Labs  Lab 08/02/23 1048  AST 32  ALT 32  ALKPHOS 50  BILITOT 1.2  PROT 7.4  ALBUMIN 3.5   No results for input(s): "LIPASE", "AMYLASE" in the last 168 hours. No results for input(s): "AMMONIA" in the last 168 hours. CBC: Recent Labs  Lab 08/02/23 1048  WBC 12.0*  HGB 8.4*  HCT 23.1*  MCV 81.9  PLT 285   Cardiac Enzymes: No results for input(s): "CKTOTAL", "CKMB", "CKMBINDEX", "TROPONINI" in the last 168 hours. BNP: Invalid input(s): "POCBNP" CBG: Recent Labs  Lab 08/06/23 1108 08/06/23 1635 08/06/23 2040 08/07/23 0737 08/07/23 1144  GLUCAP 151* 105* 169* 110* 142*    Time coordinating discharge: 30 minutes  Signed: Nolon Nations  APRN, MSN, FNP-C Patient Care [facility] [address] [place], [place] [address] [phone]  [facility] 08/07/2023, 3:11 PM

## 2023-08-07 NOTE — Plan of Care (Signed)
  Problem: Education: Goal: Knowledge of vaso-occlusive preventative measures will improve Outcome: Progressing Goal: Awareness of infection prevention will improve Outcome: Progressing Goal: Awareness of signs and symptoms of anemia will improve Outcome: Progressing Goal: Long-term complications will improve Outcome: Progressing   Problem: Self-Care: Goal: Ability to incorporate actions that prevent/reduce pain crisis will improve Outcome: Progressing   Problem: Bowel/Gastric: Goal: Gut motility will be maintained Outcome: Progressing   Problem: Tissue Perfusion: Goal: Complications related to inadequate tissue perfusion will be avoided or minimized Outcome: Progressing   Problem: Respiratory: Goal: Pulmonary complications will be avoided or minimized Outcome: Progressing Goal: Acute Chest Syndrome will be identified early to prevent complications Outcome: Progressing   Problem: Fluid Volume: Goal: Ability to maintain a balanced intake and output will improve Outcome: Progressing   Problem: Sensory: Goal: Pain level will decrease with appropriate interventions Outcome: Progressing   Problem: Health Behavior: Goal: Postive changes in compliance with treatment and prescription regimens will improve Outcome: Progressing   Problem: Education: Goal: Knowledge of General Education information will improve Description: Including pain rating scale, medication(s)/side effects and non-pharmacologic comfort measures Outcome: Progressing   Problem: Health Behavior/Discharge Planning: Goal: Ability to manage health-related needs will improve Outcome: Progressing   Problem: Clinical Measurements: Goal: Ability to maintain clinical measurements within normal limits will improve Outcome: Progressing Goal: Will remain free from infection Outcome: Progressing Goal: Diagnostic test results will improve Outcome: Progressing Goal: Respiratory complications will improve Outcome:  Progressing Goal: Cardiovascular complication will be avoided Outcome: Progressing   Problem: Activity: Goal: Risk for activity intolerance will decrease Outcome: Progressing   Problem: Nutrition: Goal: Adequate nutrition will be maintained Outcome: Progressing   Problem: Coping: Goal: Level of anxiety will decrease Outcome: Progressing   Problem: Elimination: Goal: Will not experience complications related to bowel motility Outcome: Progressing Goal: Will not experience complications related to urinary retention Outcome: Progressing   Problem: Pain Management: Goal: General experience of comfort will improve Outcome: Progressing   Problem: Safety: Goal: Ability to remain free from injury will improve Outcome: Progressing   Problem: Skin Integrity: Goal: Risk for impaired skin integrity will decrease Outcome: Progressing   Problem: Education: Goal: Ability to describe self-care measures that may prevent or decrease complications (Diabetes Survival Skills Education) will improve Outcome: Progressing Goal: Individualized Educational Video(s) Outcome: Progressing   Problem: Coping: Goal: Ability to adjust to condition or change in health will improve Outcome: Progressing   Problem: Fluid Volume: Goal: Ability to maintain a balanced intake and output will improve Outcome: Progressing   Problem: Health Behavior/Discharge Planning: Goal: Ability to identify and utilize available resources and services will improve Outcome: Progressing Goal: Ability to manage health-related needs will improve Outcome: Progressing   Problem: Metabolic: Goal: Ability to maintain appropriate glucose levels will improve Outcome: Progressing   Problem: Nutritional: Goal: Maintenance of adequate nutrition will improve Outcome: Progressing Goal: Progress toward achieving an optimal weight will improve Outcome: Progressing   Problem: Skin Integrity: Goal: Risk for impaired skin  integrity will decrease Outcome: Progressing   Problem: Tissue Perfusion: Goal: Adequacy of tissue perfusion will improve Outcome: Progressing

## 2023-08-10 ENCOUNTER — Telehealth: Payer: Self-pay

## 2023-08-10 NOTE — Transitions of Care (Post Inpatient/ED Visit) (Signed)
   08/10/2023  Name: Nicolas [name] MRN: [id number] DOB: [birth year]  Today's TOC FU Call Status: Today's TOC FU Call Status:: Unsuccessful Call (1st Attempt) Unsuccessful Call (1st Attempt) Date: 08/10/23  Attempted to reach the patient regarding the most recent Inpatient/ED visit.  Follow Up Plan: Additional outreach attempts will be made to reach the patient to complete the Transitions of Care (Post Inpatient/ED visit) call.     Antionette Fairy, RN,BSN,CCM RN Care Manager Transitions of Care  [place]  Direct Phone: [phone] Toll Free: [phone] Fax: [phone]

## 2023-08-10 NOTE — Transitions of Care (Post Inpatient/ED Visit) (Signed)
08/10/2023  Name: Nicolas [name] MRN: [id number] DOB: July 26, 1976  Today's TOC FU Call Status: Today's TOC FU Call Status:: Successful TOC FU Call Completed TOC FU Call Complete Date: 08/10/23 (Voicemail message received from pt-return call placed to pt) Patient's Name and Date of Birth confirmed.  Transition Care Management Follow-up Telephone Call Date of Discharge: 08/07/23 Discharge Facility: [facility]) Type of Discharge: Inpatient Admission Primary Inpatient Discharge Diagnosis:: "sickle cell disease with crisis and other complications" How have you been since you were released from the hospital?: Better (Pt voices his "pain is going down slowly", current pain 5/10-last took pain meds over 5hrs ago-gettting ready to take another dose, appetite good, BM today) Any questions or concerns?: No  Items Reviewed: Did you receive and understand the discharge instructions provided?: Yes Medications obtained,verified, and reconciled?: Partial Review Completed Reason for Partial Mediation Review: reviewed sx mgmt meds-pt states no changes to meds-he is aware of his meds and did not need to review them all Any new allergies since your discharge?: No Dietary orders reviewed?: Yes Type of Diet Ordered:: low salt/heart healthy Do you have support at home?: Yes People in Home: spouse Name of Support/Comfort Primary Source: [place]  Medications Reviewed Today: Medications Reviewed Today     Reviewed by Charlyn Minerva, RN (Registered Nurse) on 08/10/23 at 1323  Med List Status: <None>   Medication Order Taking? Sig Documenting Provider Last Dose Status Informant  amLODipine (NORVASC) 10 MG tablet [id number]  Take 1 tablet (10 mg total) by mouth daily. Dorothyann Peng, MD  Active Self, Pharmacy Records  carvedilol (COREG) 6.25 MG tablet [id number]  TAKE 1 TABLET BY MOUTH TWICE A DAY WITH FOOD Dorothyann Peng, MD  Active Self, Pharmacy Records  Cholecalciferol (VITAMIN D3) 2000  units capsule [id number]  Take 2,000 Units by mouth daily. [provider]  Active Self, Pharmacy Records           Med Note Nuala Alpha Sep 22, 2016  6:09 AM)    dorzolamide-timolol (COSOPT) 2-0.5 % ophthalmic solution [id number]  Place 1 drop into the left eye daily.  Patient not taking: Reported on 07/27/2023   [name], MD  Active Self, Pharmacy Records  doxycycline (MONODOX) 100 MG capsule [id number]  Take 1 capsule (100 mg total) by mouth 2 (two) times daily.  Patient not taking: Reported on 07/27/2023   Arnette Felts, FNP  Active Self, Pharmacy Records  folic acid (FOLVITE) 1 MG tablet [id number]  Take 1 tablet (1 mg total) by mouth daily.  Patient taking differently: Take 1 mg by mouth 2 (two) times daily.   Massie Maroon, FNP  Active Self, Pharmacy Records  glucose blood test strip [id number]  Use as instructed  Patient taking differently: 1 each by Other route every other day. Use as instructed   Viyuoh, Adeline C, MD  Active Self, Pharmacy Records  HYDROcodone-acetaminophen (NORCO/VICODIN) 5-325 MG tablet [id number]  Take 1 tablet by mouth every 4 (four) hours as needed for moderate pain.  Patient not taking: Reported on 07/27/2023   [name], MD  Active Self, Pharmacy Records  Lancets [facility] MULTICLIX) lancets [id number]  Use as instructed  Patient taking differently: 1 each by Other route every other day.   Kela Millin, MD  Active Self, Pharmacy Records  lisinopril (ZESTRIL) 10 MG tablet [id number]  Take 10 mg by mouth daily.  Patient not taking: Reported on 07/30/2023   [provider]  Active Self, Pharmacy Records  metFORMIN (GLUCOPHAGE) 500 MG tablet [id number]  Take 1 tablet (500 mg total) by mouth 2 (two) times daily with a meal. Dorothyann Peng, MD  Active Self, Pharmacy Records  Multiple Vitamin (ONE-A-DAY MENS PO) [id number]  Take 1 tablet by mouth daily with breakfast. [provider]  Active Self, Pharmacy Records   oxyCODONE-acetaminophen (PERCOCET) 7.5-325 MG tablet [id number] Yes Take 1 tablet by mouth every 4 (four) hours as needed for severe pain. Dorothyann Peng, MD Taking Active Self, Pharmacy Records  pravastatin (PRAVACHOL) 20 MG tablet [id number]  TAKE 1 TABLET BY MOUTH EVERY DAY IN THE Josephine Igo, MD  Active Self, Pharmacy Records  prednisoLONE acetate (PRED FORTE) 1 % ophthalmic suspension [id number]  Place 1 drop into the left eye 3 (three) times daily.  Patient not taking: Reported on 07/27/2023   [name], MD  Active Self, Pharmacy Records  Semaglutide,0.25 or 0.5MG /DOS, (OZEMPIC, 0.25 OR 0.5 MG/DOSE,) 2 MG/3ML SOPN [id number]  INJECT 0.5 MG INTO THE SKIN ONCE A WEEK.  Patient taking differently: Inject 0.5 mg into the skin once a week. Saturdays   Dorothyann Peng, MD  Active Self, Pharmacy Records  tiZANidine [facility]) 4 MG tablet [id number]  TAKE 1 TABLET BY MOUTH EVERY DAY Dorothyann Peng, MD  Active Self, Pharmacy Records  triamterene-hydrochlorothiazide [facility]) 37.5-25 MG tablet [id number]  TAKE 1 TABLET BY MOUTH EVERY DAY Dorothyann Peng, MD  Active Self, Pharmacy Records  valsartan (DIOVAN) 160 MG tablet [id number]  TAKE 1 TABLET BY MOUTH EVERY DAY  Patient not taking: Reported on 07/30/2023   Dorothyann Peng, MD  Active Self, Pharmacy Records            Home Care and Equipment/Supplies: Were Home Health Services Ordered?: NA Any new equipment or medical supplies ordered?: NA  Functional Questionnaire: Do you need assistance with bathing/showering or dressing?: No Do you need assistance with meal preparation?: No Do you need assistance with eating?: No Do you have difficulty maintaining continence: No Do you need assistance with getting out of bed/getting out of a chair/moving?: No Do you have difficulty managing or taking your medications?: No  Follow up appointments reviewed: PCP Follow-up appointment confirmed?: Yes Date of PCP follow-up appointment?:  08/13/23 (Care guide assisted with making appt while on call) Follow-up Provider: Elder Cyphers Follow-up appointment confirmed?: No Reason Specialist Follow-Up Not Confirmed: Patient has Specialist Provider Number and will Call for Appointment (pt voices he is [facility] appt not until Feb-he will call office to see if he needs to be seen sooner) Do you need transportation to your follow-up appointment?: No (pt voices his wife or daughter takes him to appts) Do you understand care options if your condition(s) worsen?: Yes-patient verbalized understanding  SDOH Interventions Today    Flowsheet Row Most Recent Value  SDOH Interventions   Food Insecurity Interventions Intervention Not Indicated  Housing Interventions Intervention Not Indicated  Transportation Interventions Intervention Not Indicated      TOC interventions discussed/reviewed: -Doctor visit discussed/reviewed -PCP -Doctor visits discussed/reviewed-Specialist -Arranged PCP f/u appt within 7 days/Care guide scheduled -Provided Verbal Education: sickle cell, pain mgmt, medications & their functions, nutrition   Antionette Fairy, RN,BSN,CCM RN Care Manager Transitions of Care  [place]  Direct Phone: [phone] Toll Free: [phone] Fax: [phone]

## 2023-08-13 ENCOUNTER — Encounter: Payer: Self-pay | Admitting: Family Medicine

## 2023-08-13 ENCOUNTER — Ambulatory Visit: Payer: 59 | Admitting: Family Medicine

## 2023-08-13 ENCOUNTER — Other Ambulatory Visit: Payer: Self-pay | Admitting: Internal Medicine

## 2023-08-13 VITALS — BP 120/84 | HR 102 | Temp 97.8°F | Ht 68.0 in | Wt 221.0 lb

## 2023-08-13 DIAGNOSIS — E66811 Obesity, class 1: Secondary | ICD-10-CM

## 2023-08-13 DIAGNOSIS — N182 Chronic kidney disease, stage 2 (mild): Secondary | ICD-10-CM | POA: Diagnosis not present

## 2023-08-13 DIAGNOSIS — E6609 Other obesity due to excess calories: Secondary | ICD-10-CM

## 2023-08-13 DIAGNOSIS — R252 Cramp and spasm: Secondary | ICD-10-CM | POA: Diagnosis not present

## 2023-08-13 DIAGNOSIS — I129 Hypertensive chronic kidney disease with stage 1 through stage 4 chronic kidney disease, or unspecified chronic kidney disease: Secondary | ICD-10-CM

## 2023-08-13 DIAGNOSIS — D571 Sickle-cell disease without crisis: Secondary | ICD-10-CM | POA: Diagnosis not present

## 2023-08-13 DIAGNOSIS — D5700 Hb-SS disease with crisis, unspecified: Secondary | ICD-10-CM

## 2023-08-13 DIAGNOSIS — Z6833 Body mass index (BMI) 33.0-33.9, adult: Secondary | ICD-10-CM

## 2023-08-13 MED ORDER — CARVEDILOL 6.25 MG PO TABS: 6.2500 mg | ORAL_TABLET | Freq: Two times a day (BID) | ORAL | 1 refills | Status: DC

## 2023-08-13 MED ORDER — METHOCARBAMOL 750 MG PO TABS
750.0000 mg | ORAL_TABLET | Freq: Three times a day (TID) | ORAL | 0 refills | Status: DC | PRN
Start: 2023-08-13 — End: 2023-11-19

## 2023-08-13 NOTE — Progress Notes (Signed)
I,[name], CMA,acting as a Neurosurgeon for [employer], NP.,have documented all relevant documentation on the behalf of Nicolas Hose, NP,as directed by  Nicolas Hose, NP while in the presence of Nicolas Hose, NP.  Subjective:  Patient ID: Nicolas [name] , male    DOB: 09/25/1976 , 47 y.o.   MRN: [id number]  Chief Complaint  Patient presents with   Hospital F/U    HPI  Patient is a 47 year old male that presents today for a hospital follow up. Patient went to [facility] on 07/29/23 and was admitted for sickle cell anemia crisis. He was discharged on 08/07/23. Patient reports he still feeling some pain, but it is slowly getting better.      Past Medical History:  Diagnosis Date   Arthritis    arthritis- back & L hip   Depression    situational depression, pt. out of work    Diabetes mellitus (HCC)    HTN (hypertension) 05/10/2015   Osteoarthritis of left hip 06/07/2012   Sickle cell anemia (HCC)      Family History  Problem Relation Age of Onset   Diabetes Mother    Cancer - Other Father    Liver cancer Father    CAD Neg Hx      Current Outpatient Medications:    amLODipine (NORVASC) 10 MG tablet, Take 1 tablet (10 mg total) by mouth daily., Disp: 90 tablet, Rfl: 1   Cholecalciferol (VITAMIN D3) 2000 units capsule, Take 2,000 Units by mouth daily., Disp: , Rfl: 5   folic acid (FOLVITE) 1 MG tablet, Take 1 tablet (1 mg total) by mouth daily. (Patient taking differently: Take 1 mg by mouth 2 (two) times daily.), Disp: 30 tablet, Rfl: 11   glucose blood test strip, Use as instructed (Patient taking differently: 1 each by Other route every other day. Use as instructed), Disp: 100 each, Rfl: 12   Lancets (ACCU-CHEK MULTICLIX) lancets, Use as instructed (Patient taking differently: 1 each by Other route every other day.), Disp: 100 each, Rfl: 12   metFORMIN (GLUCOPHAGE) 500 MG tablet, Take 1 tablet (500 mg total) by mouth 2 (two) times daily with a meal., Disp: 180 tablet, Rfl: 1    methocarbamol (ROBAXIN) 750 MG tablet, Take 1 tablet (750 mg total) by mouth every 8 (eight) hours as needed for muscle spasms., Disp: 30 tablet, Rfl: 0   Multiple Vitamin (ONE-A-DAY MENS PO), Take 1 tablet by mouth daily with breakfast., Disp: , Rfl:    oxyCODONE-acetaminophen (PERCOCET) 7.5-325 MG tablet, Take 1 tablet by mouth every 4 (four) hours as needed for severe pain., Disp: 20 tablet, Rfl: 0   pravastatin (PRAVACHOL) 20 MG tablet, TAKE 1 TABLET BY MOUTH EVERY DAY IN THE EVENING, Disp: 90 tablet, Rfl: 1   Semaglutide,0.25 or 0.5MG /DOS, (OZEMPIC, 0.25 OR 0.5 MG/DOSE,) 2 MG/3ML SOPN, INJECT 0.5 MG INTO THE SKIN ONCE A WEEK. (Patient taking differently: Inject 0.5 mg into the skin once a week. Saturdays), Disp: 9 mL, Rfl: 3   triamterene-hydrochlorothiazide (MAXZIDE-25) 37.5-25 MG tablet, TAKE 1 TABLET BY MOUTH EVERY DAY, Disp: 90 tablet, Rfl: 1   valsartan (DIOVAN) 160 MG tablet, TAKE 1 TABLET BY MOUTH EVERY DAY, Disp: 90 tablet, Rfl: 3   carvedilol (COREG) 6.25 MG tablet, Take 1 tablet (6.25 mg total) by mouth 2 (two) times daily with a meal., Disp: 180 tablet, Rfl: 1   tiZANidine (ZANAFLEX) 4 MG tablet, TAKE 1 TABLET BY MOUTH EVERY DAY, Disp: 90 tablet, Rfl: 2  No Known Allergies   Review of Systems  Constitutional: Negative.   HENT: Negative.    Respiratory: Negative.    Cardiovascular: Negative.   Musculoskeletal:  Positive for arthralgias and myalgias.     Today's Vitals   08/13/23 1554 08/13/23 1626  BP: (!) 130/90 120/84  Pulse: (!) 113 (!) 102  Temp: 97.8 F (36.6 C)   Weight: 221 lb (100.2 kg)   Height: 5\' 8"  (1.727 m)   PainSc: 6    PainLoc: Back    Body mass index is 33.6 kg/m.  Wt Readings from Last 3 Encounters:  08/13/23 221 lb (100.2 kg)  07/29/23 225 lb (102.1 kg)  07/27/23 225 lb (102.1 kg)    The ASCVD Risk score (Arnett DK, et al., 2019) failed to calculate for the following reasons:   The valid total cholesterol range is 130 to 320  mg/dL  Objective:  Physical Exam HENT:     Head: Normocephalic.  Eyes:     Pupils: Pupils are equal, round, and reactive to light.  Cardiovascular:     Pulses: Normal pulses.     Heart sounds: Normal heart sounds.  Pulmonary:     Effort: Pulmonary effort is normal.     Breath sounds: Normal breath sounds.  Abdominal:     General: Bowel sounds are normal.  Musculoskeletal:        General: Tenderness present.  Skin:    General: Skin is warm and dry.  Neurological:     Mental Status: He is alert and oriented to person, place, and time.  Psychiatric:        Mood and Affect: Mood normal.         Assessment And Plan:  Benign hypertension with CKD (chronic kidney disease), stage II Assessment & Plan: Encouraged to keep BP well controlled and avoid use of NSAIDs .  Orders: -     Carvedilol; Take 1 tablet (6.25 mg total) by mouth 2 (two) times daily with a meal.  Dispense: 180 tablet; Refill: 1  Muscle cramps -     Methocarbamol; Take 1 tablet (750 mg total) by mouth every 8 (eight) hours as needed for muscle spasms.  Dispense: 30 tablet; Refill: 0  Sickle cell disease without crisis [facility]) Assessment & Plan: Recuperating after a crisis. D/c from [facility]. 11/8   Class 1 obesity due to excess calories with body mass index (BMI) of 33.0 to 33.9 in adult, unspecified whether serious comorbidity present    Return if symptoms worsen or fail to improve.  Patient was given opportunity to ask questions. Patient verbalized understanding of the plan and was able to repeat key elements of the plan. All questions were answered to their satisfaction.    I, Nicolas Hose, NP, have reviewed all documentation for this visit. The documentation on 08/20/23 for the exam, diagnosis, procedures, and orders are all accurate and complete.   IF YOU HAVE BEEN REFERRED TO A SPECIALIST, IT MAY TAKE 1-2 WEEKS TO SCHEDULE/PROCESS THE REFERRAL. IF YOU HAVE NOT HEARD FROM US/SPECIALIST IN TWO  WEEKS, PLEASE GIVE [place] A CALL AT [phone].

## 2023-08-20 DIAGNOSIS — R252 Cramp and spasm: Secondary | ICD-10-CM | POA: Insufficient documentation

## 2023-08-20 DIAGNOSIS — I129 Hypertensive chronic kidney disease with stage 1 through stage 4 chronic kidney disease, or unspecified chronic kidney disease: Secondary | ICD-10-CM | POA: Insufficient documentation

## 2023-08-20 NOTE — Assessment & Plan Note (Signed)
Encouraged to keep BP well controlled and avoid use of NSAIDs

## 2023-08-20 NOTE — Assessment & Plan Note (Addendum)
Recuperating after a crisis. D/c from [facility]. 11/8. On Percocet 7.5/325 TID PRN for pain.

## 2023-09-12 ENCOUNTER — Other Ambulatory Visit: Payer: Self-pay | Admitting: Internal Medicine

## 2023-11-06 ENCOUNTER — Other Ambulatory Visit (INDEPENDENT_AMBULATORY_CARE_PROVIDER_SITE_OTHER): Payer: Self-pay | Admitting: Ophthalmology

## 2023-11-18 ENCOUNTER — Encounter (HOSPITAL_COMMUNITY): Payer: Self-pay

## 2023-11-18 ENCOUNTER — Other Ambulatory Visit: Payer: Self-pay

## 2023-11-18 ENCOUNTER — Inpatient Hospital Stay (HOSPITAL_COMMUNITY)
Admission: EM | Admit: 2023-11-18 | Discharge: 2023-11-26 | DRG: 811 | Disposition: A | Discharge status: Home / Self Care | Payer: 59 | Attending: Family Medicine | Admitting: Family Medicine

## 2023-11-18 ENCOUNTER — Emergency Department (HOSPITAL_COMMUNITY): Payer: 59

## 2023-11-18 DIAGNOSIS — Z800 Family history of malignant neoplasm of digestive organs: Secondary | ICD-10-CM

## 2023-11-18 DIAGNOSIS — Z91148 Patient's other noncompliance with medication regimen for other reason: Secondary | ICD-10-CM | POA: Diagnosis not present

## 2023-11-18 DIAGNOSIS — G894 Chronic pain syndrome: Secondary | ICD-10-CM | POA: Diagnosis present

## 2023-11-18 DIAGNOSIS — Z7985 Long-term (current) use of injectable non-insulin antidiabetic drugs: Secondary | ICD-10-CM

## 2023-11-18 DIAGNOSIS — D5700 Hb-SS disease with crisis, unspecified: Secondary | ICD-10-CM | POA: Diagnosis present

## 2023-11-18 DIAGNOSIS — R109 Unspecified abdominal pain: Secondary | ICD-10-CM | POA: Diagnosis not present

## 2023-11-18 DIAGNOSIS — M545 Low back pain, unspecified: Secondary | ICD-10-CM | POA: Diagnosis not present

## 2023-11-18 DIAGNOSIS — R0989 Other specified symptoms and signs involving the circulatory and respiratory systems: Secondary | ICD-10-CM | POA: Diagnosis not present

## 2023-11-18 DIAGNOSIS — E119 Type 2 diabetes mellitus without complications: Secondary | ICD-10-CM | POA: Diagnosis present

## 2023-11-18 DIAGNOSIS — A419 Sepsis, unspecified organism: Secondary | ICD-10-CM | POA: Diagnosis not present

## 2023-11-18 DIAGNOSIS — Z79899 Other long term (current) drug therapy: Secondary | ICD-10-CM | POA: Diagnosis not present

## 2023-11-18 DIAGNOSIS — Z862 Personal history of diseases of the blood and blood-forming organs and certain disorders involving the immune mechanism: Secondary | ICD-10-CM | POA: Diagnosis not present

## 2023-11-18 DIAGNOSIS — R000 Tachycardia, unspecified: Secondary | ICD-10-CM | POA: Diagnosis not present

## 2023-11-18 DIAGNOSIS — R61 Generalized hyperhidrosis: Secondary | ICD-10-CM | POA: Diagnosis not present

## 2023-11-18 DIAGNOSIS — E876 Hypokalemia: Secondary | ICD-10-CM | POA: Diagnosis present

## 2023-11-18 DIAGNOSIS — K810 Acute cholecystitis: Secondary | ICD-10-CM | POA: Diagnosis not present

## 2023-11-18 DIAGNOSIS — N179 Acute kidney failure, unspecified: Secondary | ICD-10-CM | POA: Diagnosis present

## 2023-11-18 DIAGNOSIS — I10 Essential (primary) hypertension: Secondary | ICD-10-CM | POA: Diagnosis present

## 2023-11-18 DIAGNOSIS — G8929 Other chronic pain: Secondary | ICD-10-CM | POA: Diagnosis present

## 2023-11-18 DIAGNOSIS — Z96642 Presence of left artificial hip joint: Secondary | ICD-10-CM | POA: Diagnosis present

## 2023-11-18 DIAGNOSIS — D57219 Sickle-cell/Hb-C disease with crisis, unspecified: Secondary | ICD-10-CM

## 2023-11-18 DIAGNOSIS — E785 Hyperlipidemia, unspecified: Secondary | ICD-10-CM | POA: Diagnosis present

## 2023-11-18 DIAGNOSIS — D638 Anemia in other chronic diseases classified elsewhere: Secondary | ICD-10-CM | POA: Diagnosis present

## 2023-11-18 DIAGNOSIS — M549 Dorsalgia, unspecified: Secondary | ICD-10-CM | POA: Diagnosis not present

## 2023-11-18 DIAGNOSIS — Z833 Family history of diabetes mellitus: Secondary | ICD-10-CM | POA: Diagnosis not present

## 2023-11-18 DIAGNOSIS — Z7984 Long term (current) use of oral hypoglycemic drugs: Secondary | ICD-10-CM | POA: Diagnosis not present

## 2023-11-18 DIAGNOSIS — K8020 Calculus of gallbladder without cholecystitis without obstruction: Secondary | ICD-10-CM | POA: Diagnosis present

## 2023-11-18 DIAGNOSIS — K828 Other specified diseases of gallbladder: Secondary | ICD-10-CM | POA: Diagnosis not present

## 2023-11-18 DIAGNOSIS — K429 Umbilical hernia without obstruction or gangrene: Secondary | ICD-10-CM | POA: Diagnosis not present

## 2023-11-18 DIAGNOSIS — R509 Fever, unspecified: Secondary | ICD-10-CM | POA: Diagnosis not present

## 2023-11-18 LAB — TYPE AND SCREEN
ABO/RH(D): O NEG
Antibody Screen: NEGATIVE

## 2023-11-18 LAB — COMPREHENSIVE METABOLIC PANEL
ALT: 30 U/L (ref 0–44)
AST: 40 U/L (ref 15–41)
Albumin: 4.2 g/dL (ref 3.5–5.0)
Alkaline Phosphatase: 39 U/L (ref 38–126)
Anion gap: 15 (ref 5–15)
BUN: 10 mg/dL (ref 6–20)
CO2: 27 mmol/L (ref 22–32)
Calcium: 9.3 mg/dL (ref 8.9–10.3)
Chloride: 105 mmol/L (ref 98–111)
Creatinine, Ser: 1.75 mg/dL — ABNORMAL HIGH (ref 0.61–1.24)
GFR, Estimated: 47 mL/min — ABNORMAL LOW (ref >60)
Glucose, Bld: 117 mg/dL — ABNORMAL HIGH (ref 70–99)
Potassium: 2.9 mmol/L — ABNORMAL LOW (ref 3.5–5.1)
Sodium: 147 mmol/L — ABNORMAL HIGH (ref 135–145)
Total Bilirubin: 1.2 mg/dL (ref 0.0–1.2)
Total Protein: 7.2 g/dL (ref 6.5–8.1)

## 2023-11-18 LAB — RETICULOCYTES
Immature Retic Fract: 37.4 % — ABNORMAL HIGH (ref 2.3–15.9)
RBC.: 3.81 MIL/uL — ABNORMAL LOW (ref 4.22–5.81)
Retic Count, Absolute: 200 10*3/uL — ABNORMAL HIGH (ref 19.0–186.0)
Retic Ct Pct: 5.3 % — ABNORMAL HIGH (ref 0.4–3.1)

## 2023-11-18 LAB — CBC WITH DIFFERENTIAL/PLATELET
Abs Immature Granulocytes: 0.05 10*3/uL (ref 0.00–0.07)
Basophils Absolute: 0.1 10*3/uL (ref 0.0–0.1)
Basophils Relative: 0 %
Eosinophils Absolute: 0.3 10*3/uL (ref 0.0–0.5)
Eosinophils Relative: 2 %
HCT: 32.4 % — ABNORMAL LOW (ref 39.0–52.0)
Hemoglobin: 11.7 g/dL — ABNORMAL LOW (ref 13.0–17.0)
Immature Granulocytes: 0 %
Lymphocytes Relative: 16 %
Lymphs Abs: 2 10*3/uL (ref 0.7–4.0)
MCH: 30.1 pg (ref 26.0–34.0)
MCHC: 36.1 g/dL — ABNORMAL HIGH (ref 30.0–36.0)
MCV: 83.3 fL (ref 80.0–100.0)
Monocytes Absolute: 0.9 10*3/uL (ref 0.1–1.0)
Monocytes Relative: 7 %
Neutro Abs: 9.7 10*3/uL — ABNORMAL HIGH (ref 1.7–7.7)
Neutrophils Relative %: 75 %
Platelets: 329 10*3/uL (ref 150–400)
RBC: 3.89 MIL/uL — ABNORMAL LOW (ref 4.22–5.81)
RDW: 17.9 % — ABNORMAL HIGH (ref 11.5–15.5)
WBC: 13 10*3/uL — ABNORMAL HIGH (ref 4.0–10.5)
nRBC: 1.2 % — ABNORMAL HIGH (ref 0.0–0.2)

## 2023-11-18 LAB — I-STAT CHEM 8, ED
BUN: 10 mg/dL (ref 6–20)
Calcium, Ion: 1.04 mmol/L — ABNORMAL LOW (ref 1.15–1.40)
Chloride: 106 mmol/L (ref 98–111)
Creatinine, Ser: 1.9 mg/dL — ABNORMAL HIGH (ref 0.61–1.24)
Glucose, Bld: 117 mg/dL — ABNORMAL HIGH (ref 70–99)
HCT: 37 % — ABNORMAL LOW (ref 39.0–52.0)
Hemoglobin: 12.6 g/dL — ABNORMAL LOW (ref 13.0–17.0)
Potassium: 2.8 mmol/L — ABNORMAL LOW (ref 3.5–5.1)
Sodium: 146 mmol/L — ABNORMAL HIGH (ref 135–145)
TCO2: 29 mmol/L (ref 22–32)

## 2023-11-18 LAB — SEDIMENTATION RATE: Sed Rate: 2 mm/h (ref 0–16)

## 2023-11-18 LAB — C-REACTIVE PROTEIN: CRP: 0.7 mg/dL (ref <1.0)

## 2023-11-18 MED ORDER — HYDROMORPHONE HCL 1 MG/ML IJ SOLN
2.0000 mg | INTRAMUSCULAR | Status: AC
Start: 2023-11-18 — End: 2023-11-18
  Administered 2023-11-18: 2 mg via INTRAVENOUS
  Filled 2023-11-18: qty 2

## 2023-11-18 MED ORDER — IOHEXOL 350 MG/ML SOLN
75.0000 mL | Freq: Once | INTRAVENOUS | Status: AC | PRN
  Administered 2023-11-18: 75 mL via INTRAVENOUS

## 2023-11-18 MED ORDER — KETOROLAC TROMETHAMINE 15 MG/ML IJ SOLN
15.0000 mg | Freq: Once | INTRAMUSCULAR | Status: AC
  Administered 2023-11-18: 15 mg via INTRAVENOUS
  Filled 2023-11-18: qty 1

## 2023-11-18 MED ORDER — ONDANSETRON HCL 4 MG/2ML IJ SOLN: 4.0000 mg | Freq: Four times a day (QID) | INTRAMUSCULAR | Status: DC | PRN

## 2023-11-18 MED ORDER — ENOXAPARIN SODIUM 40 MG/0.4ML IJ SOSY
40.0000 mg | PREFILLED_SYRINGE | INTRAMUSCULAR | Status: DC
  Administered 2023-11-18 – 2023-11-25 (×8): 40 mg via SUBCUTANEOUS
  Filled 2023-11-18 (×8): qty 0.4

## 2023-11-18 MED ORDER — POTASSIUM CHLORIDE 10 MEQ/100ML IV SOLN
10.0000 meq | INTRAVENOUS | Status: AC
  Administered 2023-11-18 (×2): 10 meq via INTRAVENOUS
  Filled 2023-11-18 (×2): qty 100

## 2023-11-18 MED ORDER — SENNOSIDES-DOCUSATE SODIUM 8.6-50 MG PO TABS
1.0000 | ORAL_TABLET | Freq: Two times a day (BID) | ORAL | Status: DC
  Administered 2023-11-18 – 2023-11-25 (×12): 1 via ORAL
  Filled 2023-11-18 (×15): qty 1

## 2023-11-18 MED ORDER — DIPHENHYDRAMINE HCL 25 MG PO CAPS
25.0000 mg | ORAL_CAPSULE | ORAL | Status: DC | PRN
  Administered 2023-11-19 – 2023-11-23 (×2): 25 mg via ORAL
  Filled 2023-11-18 (×2): qty 1

## 2023-11-18 MED ORDER — LACTATED RINGERS IV BOLUS
1000.0000 mL | Freq: Once | INTRAVENOUS | Status: AC
  Administered 2023-11-18: 1000 mL via INTRAVENOUS

## 2023-11-18 MED ORDER — HYDROMORPHONE HCL 1 MG/ML IJ SOLN
2.0000 mg | INTRAMUSCULAR | Status: AC
  Administered 2023-11-18: 2 mg via INTRAVENOUS
  Filled 2023-11-18: qty 2

## 2023-11-18 MED ORDER — POLYETHYLENE GLYCOL 3350 17 G PO PACK
17.0000 g | PACK | Freq: Every day | ORAL | Status: DC | PRN
  Administered 2023-11-23: 17 g via ORAL
  Filled 2023-11-18: qty 1

## 2023-11-18 MED ORDER — CARVEDILOL 6.25 MG PO TABS
6.2500 mg | ORAL_TABLET | Freq: Two times a day (BID) | ORAL | Status: DC
  Administered 2023-11-19 – 2023-11-26 (×15): 6.25 mg via ORAL
  Filled 2023-11-18 (×15): qty 1

## 2023-11-18 MED ORDER — HYDROMORPHONE HCL 1 MG/ML IJ SOLN: 1.0000 mg | INTRAMUSCULAR | Status: DC | PRN

## 2023-11-18 MED ORDER — AMLODIPINE BESYLATE 10 MG PO TABS
10.0000 mg | ORAL_TABLET | Freq: Every day | ORAL | Status: DC
  Administered 2023-11-19 – 2023-11-26 (×8): 10 mg via ORAL
  Filled 2023-11-18 (×8): qty 1

## 2023-11-18 MED ORDER — PRAVASTATIN SODIUM 20 MG PO TABS
20.0000 mg | ORAL_TABLET | Freq: Every day | ORAL | Status: DC
  Administered 2023-11-19 – 2023-11-25 (×7): 20 mg via ORAL
  Filled 2023-11-18 (×7): qty 1

## 2023-11-18 MED ORDER — HYDROMORPHONE 1 MG/ML IV SOLN
INTRAVENOUS | Status: DC
  Administered 2023-11-19: 5 mg via INTRAVENOUS
  Administered 2023-11-19: 30 mg via INTRAVENOUS
  Administered 2023-11-19: 5 mg via INTRAVENOUS
  Administered 2023-11-19 (×2): 2 mg via INTRAVENOUS
  Administered 2023-11-19: 1.5 mg via INTRAVENOUS
  Administered 2023-11-20: 4 mg via INTRAVENOUS
  Administered 2023-11-20: 3 mg via INTRAVENOUS
  Administered 2023-11-20: 1 mg via INTRAVENOUS
  Administered 2023-11-20: 30 mg via INTRAVENOUS
  Administered 2023-11-20: 3 mg via INTRAVENOUS
  Administered 2023-11-20: 2 mg via INTRAVENOUS
  Administered 2023-11-21: 1.5 mg via INTRAVENOUS
  Administered 2023-11-21: 2 mg via INTRAVENOUS
  Administered 2023-11-21: 2.5 mg via INTRAVENOUS
  Administered 2023-11-21: 2 mg via INTRAVENOUS
  Administered 2023-11-21: 3.5 mg via INTRAVENOUS
  Administered 2023-11-22: 2 mg via INTRAVENOUS
  Administered 2023-11-22: 3 mg via INTRAVENOUS
  Administered 2023-11-22: 2.5 mg via INTRAVENOUS
  Administered 2023-11-22: 1 mg via INTRAVENOUS
  Administered 2023-11-22: 30 mg via INTRAVENOUS
  Administered 2023-11-23: 2.5 mg via INTRAVENOUS
  Administered 2023-11-23: 2 mg via INTRAVENOUS
  Administered 2023-11-23: 1 mg via INTRAVENOUS
  Administered 2023-11-23: 4.5 mg via INTRAVENOUS
  Administered 2023-11-24: 2.5 mg via INTRAVENOUS
  Administered 2023-11-24: 1.5 mg via INTRAVENOUS
  Administered 2023-11-24: 3 mg via INTRAVENOUS
  Administered 2023-11-24: 1.29 mg via INTRAVENOUS
  Administered 2023-11-24: 30 mg via INTRAVENOUS
  Administered 2023-11-24: 1 mg via INTRAVENOUS
  Administered 2023-11-24: 2 mg via INTRAVENOUS
  Administered 2023-11-25: 3 mg via INTRAVENOUS
  Administered 2023-11-25: 3.5 mg via INTRAVENOUS
  Administered 2023-11-25: 1 mg via INTRAVENOUS
  Administered 2023-11-25: 0.5 mg via INTRAVENOUS
  Administered 2023-11-25: 3.5 mg via INTRAVENOUS
  Administered 2023-11-26: 4 mg via INTRAVENOUS
  Administered 2023-11-26: 3.5 mg via INTRAVENOUS
  Administered 2023-11-26: 1 mg via INTRAVENOUS
  Administered 2023-11-26: 4 mg via INTRAVENOUS
  Filled 2023-11-18 (×4): qty 30

## 2023-11-18 MED ORDER — HYDROMORPHONE HCL 1 MG/ML IJ SOLN: 2.0000 mg | Freq: Once | INTRAMUSCULAR | Status: DC

## 2023-11-18 MED ORDER — NALOXONE HCL 0.4 MG/ML IJ SOLN: 0.4000 mg | INTRAMUSCULAR | Status: DC | PRN

## 2023-11-18 MED ORDER — POTASSIUM CHLORIDE 20 MEQ PO PACK
40.0000 meq | PACK | Freq: Once | ORAL | Status: AC
  Administered 2023-11-18: 40 meq via ORAL
  Filled 2023-11-18: qty 2

## 2023-11-18 MED ORDER — SODIUM CHLORIDE 0.45 % IV SOLN: INTRAVENOUS | Status: AC

## 2023-11-18 MED ORDER — SODIUM CHLORIDE 0.9% FLUSH: 9.0000 mL | INTRAVENOUS | Status: DC | PRN

## 2023-11-18 NOTE — ED Provider Notes (Signed)
 Care of patient received from prior provider at 3:33 PM, please see their note for complete H/P and care plan.  Received handoff per ED course.  Clinical Course as of 11/18/23 1533  Wed Nov 18, 2023  1532 Stable 48 YOM with a hx of [place]. Called EMS for back pain. CTA pending.  [CC]    Clinical Course User Index [CC] Glyn Ade, MD   CRITICAL CARE Performed by: Glyn Ade   Total critical care time: 35 minutes for 3x doses narcotic IV  Critical care time was exclusive of separately billable procedures and treating other patients.  Critical care was necessary to treat or prevent imminent or life-threatening deterioration.  Critical care was time spent personally by me on the following activities: development of treatment plan with patient and/or surrogate as well as nursing, discussions with consultants, evaluation of patient's response to treatment, examination of patient, obtaining history from patient or surrogate, ordering and performing treatments and interventions, ordering and review of laboratory studies, ordering and review of radiographic studies, pulse oximetry and re-evaluation of patient's condition.   Reassessment: Pain still uncontrolled.  Frequently reassessed.  He required multiple doses of IV medication to get control of his syndrome.  Ultimately required admission to medicine for acute sickle cell pain crisis.  Disposition:   Based on the above findings, I believe this patient is stable for admission.    Patient/family educated about specific findings on our evaluation and explained exact reasons for admission.  Patient/family educated about clinical situation and time was allowed to answer questions.   Admission team communicated with and agreed with need for admission. Patient admitted. Patient ready to move at this time.     Emergency Department Medication Summary:   Medications  amLODipine (NORVASC) tablet 10 mg (has no administration in time  range)  carvedilol (COREG) tablet 6.25 mg (has no administration in time range)  pravastatin (PRAVACHOL) tablet 20 mg (has no administration in time range)  senna-docusate (Senokot-S) tablet 1 tablet (1 tablet Oral Given 11/18/23 2054)  polyethylene glycol (MIRALAX / GLYCOLAX) packet 17 g (has no administration in time range)  naloxone (NARCAN) injection 0.4 mg (has no administration in time range)    And  sodium chloride flush (NS) 0.9 % injection 9 mL (has no administration in time range)  ondansetron (ZOFRAN) injection 4 mg (has no administration in time range)  diphenhydrAMINE (BENADRYL) capsule 25 mg (has no administration in time range)  enoxaparin (LOVENOX) injection 40 mg (40 mg Subcutaneous Given 11/18/23 2053)  HYDROmorphone (DILAUDID) 1 mg/mL PCA injection (has no administration in time range)  0.45 % sodium chloride infusion ( Intravenous New Bag/Given 11/18/23 2052)  HYDROmorphone (DILAUDID) injection 1 mg (has no administration in time range)  HYDROmorphone (DILAUDID) injection 2 mg (2 mg Intravenous Given 11/18/23 1500)  HYDROmorphone (DILAUDID) injection 2 mg (2 mg Intravenous Given 11/18/23 1624)  HYDROmorphone (DILAUDID) injection 2 mg (2 mg Intravenous Given 11/18/23 1701)  ketorolac (TORADOL) 15 MG/ML injection 15 mg (15 mg Intravenous Given 11/18/23 1500)  potassium chloride 10 mEq in 100 mL IVPB (0 mEq Intravenous Stopped 11/18/23 1819)  iohexol (OMNIPAQUE) 350 MG/ML injection 75 mL (75 mLs Intravenous Contrast Given 11/18/23 1552)  lactated ringers bolus 1,000 mL (1,000 mLs Intravenous New Bag/Given 11/18/23 1827)  potassium chloride (KLOR-CON) packet 40 mEq (40 mEq Oral Given 11/18/23 2052)            Glyn Ade, MD 11/18/23 2327

## 2023-11-18 NOTE — ED Notes (Signed)
 Just assumed care of patient. Patient laying in bed c/o back pain laying in the supine position. Patient is A&O times 4. Patient is on full cardiac monitor with pulse ox.

## 2023-11-18 NOTE — ED Provider Notes (Signed)
 [place] EMERGENCY DEPARTMENT AT [facility] Provider Note   CSN: [id number] Arrival date & time: 11/18/23  1400     History  Chief Complaint  Patient presents with   Back Pain    Nicolas [name] is a 48 y.o. male.  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  Patient reports that 3 days ago he started having low back pain.  Says that it goes from left side to right side.  Says that is more severe than his typical sickle cell pain crises.  Describes it as both sharp and burning.  No radiation down his legs.  Denies any bowel or bladder incontinence.  No weakness or numbness of the legs.  No fevers.  No history of indwelling catheters or lines.  Was brought back immediately from EMS triage because he is diaphoretic.  Says he last took his oxycodone last night.  Denies any nausea vomiting or diarrhea.       Home Medications Prior to Admission medications   Medication Sig Start Date End Date Taking? Authorizing Provider  amLODipine (NORVASC) 10 MG tablet Take 1 tablet (10 mg total) by mouth daily. 05/04/23   Dorothyann Peng, MD  carvedilol (COREG) 6.25 MG tablet Take 1 tablet (6.25 mg total) by mouth 2 (two) times daily with a meal. 08/13/23   [name], NP  Cholecalciferol (VITAMIN D3) 2000 units capsule Take 2,000 Units by mouth daily. 02/05/16   [provider]  folic acid (FOLVITE) 1 MG tablet Take 1 tablet (1 mg total) by mouth daily. Patient taking differently: Take 1 mg by mouth 2 (two) times daily. 06/10/22   Massie Maroon, FNP  glucose blood test strip Use as instructed Patient taking differently: 1 each by Other route every other day. Use as instructed 08/15/13   Viyuoh, Adeline C, MD  Lancets (ACCU-CHEK MULTICLIX) lancets Use as instructed Patient taking differently: 1 each by Other route every other day. 08/15/13   Viyuoh, Rolland Bimler, MD  metFORMIN (GLUCOPHAGE) 500 MG tablet Take 1 tablet (500  mg total) by mouth 2 (two) times daily with a meal. 07/15/23   Dorothyann Peng, MD  methocarbamol (ROBAXIN) 750 MG tablet Take 1 tablet (750 mg total) by mouth every 8 (eight) hours as needed for muscle spasms. 08/13/23   [name], NP  Multiple Vitamin (ONE-A-DAY MENS PO) Take 1 tablet by mouth daily with breakfast.    [provider]  oxyCODONE-acetaminophen (PERCOCET) 7.5-325 MG tablet Take 1 tablet by mouth every 4 (four) hours as needed for severe pain. 06/26/22   Dorothyann Peng, MD  pravastatin (PRAVACHOL) 20 MG tablet TAKE 1 TABLET BY MOUTH EVERY DAY IN THE EVENING 09/14/23   Dorothyann Peng, MD  prednisoLONE acetate (PRED FORTE) 1 % ophthalmic suspension Place 1 drop into both eyes daily. 11/17/23   [provider]  Semaglutide,0.25 or 0.5MG /DOS, (OZEMPIC, 0.25 OR 0.5 MG/DOSE,) 2 MG/3ML SOPN INJECT 0.5 MG INTO THE SKIN ONCE A WEEK. Patient taking differently: Inject 0.5 mg into the skin once a week. Saturdays 07/29/23   Dorothyann Peng, MD  tiZANidine (ZANAFLEX) 4 MG tablet TAKE 1 TABLET BY MOUTH EVERY DAY 08/14/23   Dorothyann Peng, MD  triamterene-hydrochlorothiazide [facility]) 37.5-25 MG tablet TAKE 1 TABLET BY MOUTH EVERY DAY 04/23/23   Dorothyann Peng, MD  valsartan (DIOVAN) 160 MG tablet TAKE 1 TABLET BY MOUTH EVERY DAY 07/15/23   Dorothyann Peng, MD      Allergies  Patient has no known allergies.    Review of Systems   Review of Systems  Physical Exam Updated Vital Signs BP (!) 159/138   Pulse 88   Temp 98.4 F (36.9 C)   Resp 20   Ht 5\' 8"  (1.727 m)   Wt 95.3 kg   SpO2 99%   BMI 31.93 kg/m  Physical Exam Vitals and nursing note reviewed.  Constitutional:      General: He is not in acute distress.    Appearance: He is well-developed.  HENT:     Head: Normocephalic and atraumatic.     Right Ear: External ear normal.     Left Ear: External ear normal.     Nose: Nose normal.  Eyes:     Extraocular Movements: Extraocular movements intact.      Conjunctiva/sclera: Conjunctivae normal.     Pupils: Pupils are equal, round, and reactive to light.  Cardiovascular:     Rate and Rhythm: Regular rhythm. Tachycardia present.     Pulses: Normal pulses.     Heart sounds: Normal heart sounds.  Pulmonary:     Effort: Pulmonary effort is normal. No respiratory distress.     Breath sounds: Normal breath sounds.  Abdominal:     General: There is distension.     Palpations: Abdomen is soft. There is no mass.     Tenderness: There is no abdominal tenderness. There is no guarding.  Musculoskeletal:     Cervical back: Normal range of motion and neck supple.     Right lower leg: No edema.     Left lower leg: No edema.     Comments: No spinal midline TTP in cervical, thoracic, or lumbar spine. No stepoffs noted.   Motor: Muscle bulk and tone are normal. Strength is 5/5 in hip flexion, knee flexion and extension, ankle dorsiflexion and plantar flexion bilaterally. Full strength of great toe dorsiflexion bilaterally.  Sensory: Intact sensation to light touch in L2 though S1 dermatomes bilaterally.   Reflexes: Patellar 2+ bilaterally, no ankle clonus bilaterally  Skin:    General: Skin is warm and dry.  Neurological:     Mental Status: He is alert. Mental status is at baseline.  Psychiatric:        Mood and Affect: Mood normal.        Behavior: Behavior normal.     ED Results / Procedures / Treatments   Labs (all labs ordered are listed, but only abnormal results are displayed) Labs Reviewed  CBC WITH DIFFERENTIAL/PLATELET - Abnormal; Notable for the following components:      Result Value   WBC 13.0 (*)    RBC 3.89 (*)    Hemoglobin 11.7 (*)    HCT 32.4 (*)    MCHC 36.1 (*)    RDW 17.9 (*)    nRBC 1.2 (*)    Neutro Abs 9.7 (*)    All other components within normal limits  RETICULOCYTES - Abnormal; Notable for the following components:   Retic Ct Pct 5.3 (*)    RBC. 3.81 (*)    Retic Count, Absolute 200.0 (*)    Immature Retic  Fract 37.4 (*)    All other components within normal limits  COMPREHENSIVE METABOLIC PANEL - Abnormal; Notable for the following components:   Sodium 147 (*)    Potassium 2.9 (*)    Glucose, Bld 117 (*)    Creatinine, Ser 1.75 (*)    GFR, Estimated 47 (*)    All other components  within normal limits  I-STAT CHEM 8, ED - Abnormal; Notable for the following components:   Sodium 146 (*)    Potassium 2.8 (*)    Creatinine, Ser 1.90 (*)    Glucose, Bld 117 (*)    Calcium, Ion 1.04 (*)    Hemoglobin 12.6 (*)    HCT 37.0 (*)    All other components within normal limits  SEDIMENTATION RATE  C-REACTIVE PROTEIN  HIV ANTIBODY (ROUTINE TESTING W REFLEX)  BASIC METABOLIC PANEL  MAGNESIUM  TYPE AND SCREEN    EKG None  Radiology CT Angio Abd/Pel W and/or Wo Contrast Result Date: 11/18/2023 CLINICAL DATA:  Severe flank pain, history of sickle cell disease. EXAM: CTA ABDOMEN AND PELVIS WITHOUT AND WITH CONTRAST TECHNIQUE: Multidetector CT imaging of the abdomen and pelvis was performed using the standard protocol during bolus administration of intravenous contrast. Multiplanar reconstructed images and MIPs were obtained and reviewed to evaluate the vascular anatomy. RADIATION DOSE REDUCTION: This exam was performed according to the departmental dose-optimization program which includes automated exposure control, adjustment of the mA and/or kV according to patient size and/or use of iterative reconstruction technique. CONTRAST:  75mL OMNIPAQUE IOHEXOL 350 MG/ML SOLN COMPARISON:  Prior CT scan of the chest 09/26/2016 FINDINGS: VASCULAR Aorta: Normal caliber aorta without aneurysm, dissection, vasculitis or significant stenosis. Celiac: Patent without evidence of aneurysm, dissection, vasculitis or significant stenosis. SMA: Patent without evidence of aneurysm, dissection, vasculitis or significant stenosis. Renals: Both renal arteries are patent without evidence of aneurysm, dissection, vasculitis,  fibromuscular dysplasia or significant stenosis. IMA: Patent without evidence of aneurysm, dissection, vasculitis or significant stenosis. Inflow: Patent without evidence of aneurysm, dissection, vasculitis or significant stenosis. Proximal Outflow: Bilateral common femoral and visualized portions of the superficial and profunda femoral arteries are patent without evidence of aneurysm, dissection, vasculitis or significant stenosis. Veins: No focal venous abnormality. Review of the MIP images confirms the above findings. NON-VASCULAR Lower chest: No acute abnormality. Chronic cardiomegaly likely related to high cardiac output in the setting of chronic anemia. Hepatobiliary: Normal hepatic contour and morphology. No discrete hepatic lesion. Distended gallbladder with mild hyperenhancement of the mucosa. Small stones visualized. No biliary ductal dilatation. Pancreas: Unremarkable. No pancreatic ductal dilatation or surrounding inflammatory changes. 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 Adrenals/Urinary Tract: Normal adrenal glands. No hydronephrosis, nephrolithiasis or enhancing renal mass. No evidence of renal infarct. The ureters are unremarkable. Limited evaluation of the bladder due to extensive streak artifact from bilateral hip arthroplasty prostheses. Stomach/Bowel: Normal appendix in the right lower quadrant. No focal bowel wall thickening or evidence of obstruction. Lymphatic: No suspicious lymphadenopathy. Reproductive: Limited evaluation due to streak artifact from bilateral hip arthroplasty prostheses. Other: No evidence of ascites.  Fat containing umbilical hernia. Musculoskeletal: Expected H-shaped vertebral body changes throughout the visualized spine. Surgical changes of bilateral hip joint arthroplasty. IMPRESSION: 1. No evidence of arterial or venous vascular abnormality. 2. Diffusely distended gallbladder with mild hyperenhancement of the mucosa and small stones. If  the patient's flank pain is localized to the right, acute cholecystitis would be a consideration. Right upper quadrant ultrasound can further evaluate if clinically warranted. 3. No evidence of kidney stones or renal infarct. 4. Expected sequelae of sickle cell disease including auto infarction of the spleen, H-shaped vertebral body changes and bilateral hip arthroplasty. 5. Fat containing umbilical hernia. Electronically Signed   By: Malachy Moan M.D.   On: 11/18/2023 16:03    Procedures Procedures    Medications Ordered in  ED Medications  amLODipine (NORVASC) tablet 10 mg (has no administration in time range)  carvedilol (COREG) tablet 6.25 mg (has no administration in time range)  pravastatin (PRAVACHOL) tablet 20 mg (has no administration in time range)  senna-docusate (Senokot-S) tablet 1 tablet (has no administration in time range)  polyethylene glycol (MIRALAX / GLYCOLAX) packet 17 g (has no administration in time range)  naloxone (NARCAN) injection 0.4 mg (has no administration in time range)    And  sodium chloride flush (NS) 0.9 % injection 9 mL (has no administration in time range)  ondansetron (ZOFRAN) injection 4 mg (has no administration in time range)  diphenhydrAMINE (BENADRYL) capsule 25 mg (has no administration in time range)  enoxaparin (LOVENOX) injection 40 mg (has no administration in time range)  HYDROmorphone (DILAUDID) 1 mg/mL PCA injection (has no administration in time range)  0.45 % sodium chloride infusion (has no administration in time range)  potassium chloride (KLOR-CON) packet 40 mEq (has no administration in time range)  HYDROmorphone (DILAUDID) injection 1 mg (has no administration in time range)  HYDROmorphone (DILAUDID) injection 2 mg (2 mg Intravenous Given 11/18/23 1500)  HYDROmorphone (DILAUDID) injection 2 mg (2 mg Intravenous Given 11/18/23 1624)  HYDROmorphone (DILAUDID) injection 2 mg (2 mg Intravenous Given 11/18/23 1701)  ketorolac (TORADOL)  15 MG/ML injection 15 mg (15 mg Intravenous Given 11/18/23 1500)  potassium chloride 10 mEq in 100 mL IVPB (0 mEq Intravenous Stopped 11/18/23 1819)  iohexol (OMNIPAQUE) 350 MG/ML injection 75 mL (75 mLs Intravenous Contrast Given 11/18/23 1552)  lactated ringers bolus 1,000 mL (1,000 mLs Intravenous New Bag/Given 11/18/23 1827)    ED Course/ Medical Decision Making/ A&P Clinical Course as of 11/18/23 2005  Wed Nov 18, 2023  1532 Stable 48 YOM with a hx of [place]. Called EMS for back pain. CTA pending.  [CC]    Clinical Course User Index [CC] Glyn Ade, MD                                 Medical Decision Making Amount and/or Complexity of Data Reviewed Labs: ordered. Radiology: ordered.  Risk Prescription drug management. Decision regarding hospitalization.   Nicolas [name] is a 48 y.o. male with comorbidities that complicate the patient evaluation including sickle cell [place] who presents emergency department with lower back pain.   Initial Ddx:  Vaso-occlusive crisis, opiate withdrawal, renal infarct, pyelonephritis, AAA, muscle strain, spinal epidural abscess, herniated disc/spinal cord compression  MDM/Course:  Patient presents to the emergency department with severe back pain.  He was diaphoretic on arrival and in severe pain.  Not having any infectious symptoms or signs of spinal cord compression on history.  Does have a history of sickle cell [place] with pain crises that typically involve his back.  Says that he also has a history of arthritis in his back.  On exam is diaphoretic and very uncomfortable appearing.  Does not have any focal neurologic deficits at this time in his lower extremities.  Was given some Dilaudid and upon re-evaluation looks much improved.  Awaiting CTA of the abdomen pelvis to evaluate for any vascular pathology.  Signed out to the oncoming physician awaiting repeat evaluation and imaging.    This patient presents to the ED for concern of complaints listed  in HPI, this involves an extensive number of treatment options, and is a complaint that carries with it a high risk of complications and morbidity. Disposition including potential  need for admission considered.   Dispo: Pending remainder of workup  Records reviewed Outpatient Clinic Notes I personally reviewed and interpreted cardiac monitoring: normal sinus rhythm  I personally reviewed and interpreted the pt's EKG: see above for interpretation  I have reviewed the patients home medications and made adjustments as needed  Portions of this note were generated with Dragon dictation software. Dictation errors may occur despite best attempts at proofreading.     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

## 2023-11-18 NOTE — H&P (Signed)
 History and Physical    Nicolas [name] LKG:[id number] DOB: 11-May-1976 DOA: 11/18/2023  PCP: Dorothyann Peng, MD   Patient coming from: Home   Chief Complaint: Back and hip pain   HPI: Nicolas [name] is a 48 y.o. male with medical history significant for sickle cell anemia, hypertension, and osteoarthritis who presents with severe pain in his lower back and bilateral hips.  Patient began experiencing severe pain in the bilateral lower back and hips on 11/16/2023.  He denies any preceding trauma or inciting event but notes that he frequently experiences pain at the sites and attributes it to osteoarthritis and sickle cell disease.  He denies abdominal pain, nausea, vomiting, fever, or chills.  ED Course: Upon arrival to the ED, patient is found to be afebrile and saturating well on room air with stable blood pressure.  Labs are most notable for sodium 147, potassium 2.9, and creatinine 1.75.  CT angiography of the abdomen and pelvis is notable for diffusely distended gallbladder with mild enhancement and small stones.  Patient was treated in the ED with 1 L LR, 20 mill equivalents IV potassium, Toradol, and 3 doses of 2 mg IV Dilaudid.  Review of Systems:  All other systems reviewed and apart from HPI, are negative.  Past Medical History:  Diagnosis Date   Arthritis    arthritis- back & L hip   Depression    situational depression, pt. out of work    Diabetes mellitus (HCC)    HTN (hypertension) 05/10/2015   Osteoarthritis of left hip 06/07/2012   Sickle cell anemia [facility])     Past Surgical History:  Procedure Laterality Date   IR GENERIC HISTORICAL  09/26/2016   IR FLUORO GUIDE CV LINE RIGHT 09/26/2016 Irish Lack, MD [facility]INTER[facility] RAD   IR GENERIC HISTORICAL  09/26/2016   IR US GUIDE VASC ACCESS RIGHT 09/26/2016 Irish Lack, MD [facility]   JOINT REPLACEMENT Right 2012   R hip   PHOTOCOAGULATION WITH LASER Right 02/26/2023   Procedure: PHOTOCOAGULATION WITH LASER;  Surgeon:  [name], MD;  Location: [facility] OR;  Service: Ophthalmology;  Laterality: Right;   TOTAL HIP ARTHROPLASTY  06/07/2012   Procedure: TOTAL HIP ARTHROPLASTY;  Surgeon: Eulas Post, MD;  Location: [facility] OR;  Service: Orthopedics;  Laterality: Left;   VITRECTOMY 25 GAUGE WITH SCLERAL BUCKLE Left 02/26/2023   Procedure: VITRECTOMY 25 GAUGE WITH SCLERAL BUCKLE;  Surgeon: [name], MD;  Location: [facility] OR;  Service: Ophthalmology;  Laterality: Left;    Social History:   reports that he has never smoked. He has never used smokeless tobacco. He reports that he does not currently use alcohol. He reports that he does not use drugs.  No Known Allergies  Family History  Problem Relation Age of Onset   Diabetes Mother    Cancer - Other Father    Liver cancer Father    CAD Neg Hx      Prior to Admission medications   Medication Sig Start Date End Date Taking? Authorizing Provider  amLODipine (NORVASC) 10 MG tablet Take 1 tablet (10 mg total) by mouth daily. 05/04/23   Dorothyann Peng, MD  carvedilol (COREG) 6.25 MG tablet Take 1 tablet (6.25 mg total) by mouth 2 (two) times daily with a meal. 08/13/23   [name], NP  Cholecalciferol (VITAMIN D3) 2000 units capsule Take 2,000 Units by mouth daily. 02/05/16   [provider]  folic acid (FOLVITE) 1 MG tablet Take 1 tablet (1 mg total) by mouth daily.  Patient taking differently: Take 1 mg by mouth 2 (two) times daily. 06/10/22   Massie Maroon, FNP  glucose blood test strip Use as instructed Patient taking differently: 1 each by Other route every other day. Use as instructed 08/15/13   Viyuoh, Adeline C, MD  Lancets (ACCU-CHEK MULTICLIX) lancets Use as instructed Patient taking differently: 1 each by Other route every other day. 08/15/13   Viyuoh, Rolland Bimler, MD  metFORMIN (GLUCOPHAGE) 500 MG tablet Take 1 tablet (500 mg total) by mouth 2 (two) times daily with a meal. 07/15/23   Dorothyann Peng, MD  methocarbamol (ROBAXIN) 750 MG tablet  Take 1 tablet (750 mg total) by mouth every 8 (eight) hours as needed for muscle spasms. 08/13/23   [name], NP  Multiple Vitamin (ONE-A-DAY MENS PO) Take 1 tablet by mouth daily with breakfast.    [provider]  oxyCODONE-acetaminophen (PERCOCET) 7.5-325 MG tablet Take 1 tablet by mouth every 4 (four) hours as needed for severe pain. 06/26/22   Dorothyann Peng, MD  pravastatin (PRAVACHOL) 20 MG tablet TAKE 1 TABLET BY MOUTH EVERY DAY IN THE EVENING 09/14/23   Dorothyann Peng, MD  Semaglutide,0.25 or 0.5MG /DOS, (OZEMPIC, 0.25 OR 0.5 MG/DOSE,) 2 MG/3ML SOPN INJECT 0.5 MG INTO THE SKIN ONCE A WEEK. Patient taking differently: Inject 0.5 mg into the skin once a week. Saturdays 07/29/23   Dorothyann Peng, MD  tiZANidine (ZANAFLEX) 4 MG tablet TAKE 1 TABLET BY MOUTH EVERY DAY 08/14/23   Dorothyann Peng, MD  triamterene-hydrochlorothiazide [facility]) 37.5-25 MG tablet TAKE 1 TABLET BY MOUTH EVERY DAY 04/23/23   Dorothyann Peng, MD  valsartan (DIOVAN) 160 MG tablet TAKE 1 TABLET BY MOUTH EVERY DAY 07/15/23   Dorothyann Peng, MD    Physical Exam: Vitals:   11/18/23 1402 11/18/23 1403 11/18/23 1530  BP:  (!) 153/98 (!) 159/138  Pulse:  (!) 107 88  Resp:  18 20  Temp:  98.9 F (37.2 C) 98.6 F (37 C)  TempSrc:  Oral   SpO2:  98% 99%  Weight: 95.3 kg    Height: 5\' 8"  (1.727 m)       Constitutional: NAD, no pallor   Eyes: PERTLA, lids and conjunctivae normal ENMT: Mucous membranes are moist. Posterior pharynx clear of any exudate or lesions.   Neck: supple, no masses  Respiratory: no wheezing, no crackles. No accessory muscle use.  Cardiovascular: S1 & S2 heard, regular rate and rhythm. No extremity edema.   Abdomen: Soft, no tenderness. Bowel sounds active.  Musculoskeletal: no clubbing / cyanosis. No joint deformity upper and lower extremities.   Skin: no significant rashes, lesions, ulcers. Warm, well-perfused. Neurologic: CN 2-12 grossly intact. Moving all extremities. Alert and  oriented.  Psychiatric: Calm. Cooperative.    Labs and Imaging on Admission: I have personally reviewed following labs and imaging studies  CBC: Recent Labs  Lab 11/18/23 1500 11/18/23 1511  WBC 13.0*  --   NEUTROABS 9.7*  --   HGB 11.7* 12.6*  HCT 32.4* 37.0*  MCV 83.3  --   PLT 329  --    Basic Metabolic Panel: Recent Labs  Lab 11/18/23 1500 11/18/23 1511  NA 147* 146*  K 2.9* 2.8*  CL 105 106  CO2 27  --   GLUCOSE 117* 117*  BUN 10 10  CREATININE 1.75* 1.90*  CALCIUM 9.3  --    GFR: Estimated Creatinine Clearance: 52.1 mL/min (A) (by C-G formula based on SCr of 1.9 mg/dL (H)). Liver Function Tests: Recent  Labs  Lab 11/18/23 1500  AST 40  ALT 30  ALKPHOS 39  BILITOT 1.2  PROT 7.2  ALBUMIN 4.2   No results for input(s): "LIPASE", "AMYLASE" in the last 168 hours. No results for input(s): "AMMONIA" in the last 168 hours. Coagulation Profile: No results for input(s): "INR", "PROTIME" in the last 168 hours. Cardiac Enzymes: No results for input(s): "CKTOTAL", "CKMB", "CKMBINDEX", "TROPONINI" in the last 168 hours. BNP (last 3 results) No results for input(s): "PROBNP" in the last 8760 hours. HbA1C: No results for input(s): "HGBA1C" in the last 72 hours. CBG: No results for input(s): "GLUCAP" in the last 168 hours. Lipid Profile: No results for input(s): "CHOL", "HDL", "LDLCALC", "TRIG", "CHOLHDL", "LDLDIRECT" in the last 72 hours. Thyroid Function Tests: No results for input(s): "TSH", "T4TOTAL", "FREET4", "T3FREE", "THYROIDAB" in the last 72 hours. Anemia Panel: Recent Labs    11/18/23 1500  RETICCTPCT 5.3*   Urine analysis:    Component Value Date/Time   COLORURINE YELLOW 09/24/2016 1203   APPEARANCEUR CLEAR 09/24/2016 1203   LABSPEC 1.013 09/24/2016 1203   PHURINE 5.0 09/24/2016 1203   GLUCOSEU NEGATIVE 09/24/2016 1203   HGBUR NEGATIVE 09/24/2016 1203   BILIRUBINUR Negative 02/16/2023 1235   KETONESUR NEGATIVE 09/24/2016 1203   PROTEINUR  Positive (A) 02/16/2023 1235   PROTEINUR 100 (A) 09/24/2016 1203   UROBILINOGEN 0.2 02/16/2023 1235   UROBILINOGEN 1.0 05/11/2015 0219   NITRITE Negative 02/16/2023 1235   NITRITE NEGATIVE 09/24/2016 1203   LEUKOCYTESUR Trace (A) 02/16/2023 1235   Sepsis Labs: @LABRCNTIP (procalcitonin:4,lacticidven:4) )No results found for this or any previous visit (from the past 240 hours).   Radiological Exams on Admission: CT Angio Abd/Pel W and/or Wo Contrast Result Date: 11/18/2023 CLINICAL DATA:  Severe flank pain, history of sickle cell disease. EXAM: CTA ABDOMEN AND PELVIS WITHOUT AND WITH CONTRAST TECHNIQUE: Multidetector CT imaging of the abdomen and pelvis was performed using the standard protocol during bolus administration of intravenous contrast. Multiplanar reconstructed images and MIPs were obtained and reviewed to evaluate the vascular anatomy. RADIATION DOSE REDUCTION: This exam was performed according to the departmental dose-optimization program which includes automated exposure control, adjustment of the mA and/or kV according to patient size and/or use of iterative reconstruction technique. CONTRAST:  75mL OMNIPAQUE IOHEXOL 350 MG/ML SOLN COMPARISON:  Prior CT scan of the chest 09/26/2016 FINDINGS: VASCULAR Aorta: Normal caliber aorta without aneurysm, dissection, vasculitis or significant stenosis. Celiac: Patent without evidence of aneurysm, dissection, vasculitis or significant stenosis. SMA: Patent without evidence of aneurysm, dissection, vasculitis or significant stenosis. Renals: Both renal arteries are patent without evidence of aneurysm, dissection, vasculitis, fibromuscular dysplasia or significant stenosis. IMA: Patent without evidence of aneurysm, dissection, vasculitis or significant stenosis. Inflow: Patent without evidence of aneurysm, dissection, vasculitis or significant stenosis. Proximal Outflow: Bilateral common femoral and visualized portions of the superficial and profunda  femoral arteries are patent without evidence of aneurysm, dissection, vasculitis or significant stenosis. Veins: No focal venous abnormality. Review of the MIP images confirms the above findings. NON-VASCULAR Lower chest: No acute abnormality. Chronic cardiomegaly likely related to high cardiac output in the setting of chronic anemia. Hepatobiliary: Normal hepatic contour and morphology. No discrete hepatic lesion. Distended gallbladder with mild hyperenhancement of the mucosa. Small stones visualized. No biliary ductal dilatation. Pancreas: Unremarkable. No pancreatic ductal dilatation or surrounding inflammatory changes. 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 Adrenals/Urinary Tract: Normal adrenal glands. No hydronephrosis, nephrolithiasis or enhancing renal mass. No evidence of renal infarct. The ureters  are unremarkable. Limited evaluation of the bladder due to extensive streak artifact from bilateral hip arthroplasty prostheses. Stomach/Bowel: Normal appendix in the right lower quadrant. No focal bowel wall thickening or evidence of obstruction. Lymphatic: No suspicious lymphadenopathy. Reproductive: Limited evaluation due to streak artifact from bilateral hip arthroplasty prostheses. Other: No evidence of ascites.  Fat containing umbilical hernia. Musculoskeletal: Expected H-shaped vertebral body changes throughout the visualized spine. Surgical changes of bilateral hip joint arthroplasty. IMPRESSION: 1. No evidence of arterial or venous vascular abnormality. 2. Diffusely distended gallbladder with mild hyperenhancement of the mucosa and small stones. If the patient's flank pain is localized to the right, acute cholecystitis would be a consideration. Right upper quadrant ultrasound can further evaluate if clinically warranted. 3. No evidence of kidney stones or renal infarct. 4. Expected sequelae of sickle cell disease including auto infarction of the spleen, H-shaped vertebral  body changes and bilateral hip arthroplasty. 5. Fat containing umbilical hernia. Electronically Signed   By: Malachy Moan M.D.   On: 11/18/2023 16:03    Assessment/Plan   1. Sickle cell pain crisis  - Start Dilaudid PCA, avoid NSAIDs in light of AKI, hydrate with 0.45% NaCl   2. AKI  - SCr is 1.75, up from baseline of 1.2  - No hydronephrosis on CT in ED  - Continue IVF hydration, hold triamterene-hydrochlorothiazide and metformin, and repeat chem panel in am   3. Hypertension  - Continue Norvasc and Coreg   5. Hypokalemia  - Replacing    DVT prophylaxis: Lovenox  Code Status: Full  Level of Care: Level of care: Med-Surg Family Communication: None present   Disposition Plan:  Patient is from: home  Anticipated d/c is to: home  Anticipated d/c date is: 11/21/23 Patient currently: Pending pain-control, stable renal function and electrolytes  Consults called: None  Admission status: Inpatient     Briscoe Deutscher, MD [facility]  11/18/2023, 7:22 PM

## 2023-11-18 NOTE — ED Triage Notes (Signed)
 Pt here for 10/10 bilateral lumbar back pain that started 3 days ago. Diaphoretic. Hx of sickle cell and arthritis in back. Axox4. EMS 200 mcgs of fentanyl

## 2023-11-18 NOTE — ED Notes (Signed)
[facility] called for transfer 

## 2023-11-19 DIAGNOSIS — D5700 Hb-SS disease with crisis, unspecified: Secondary | ICD-10-CM | POA: Diagnosis not present

## 2023-11-19 LAB — BASIC METABOLIC PANEL
Anion gap: 14 (ref 5–15)
BUN: 11 mg/dL (ref 6–20)
CO2: 26 mmol/L (ref 22–32)
Calcium: 8.9 mg/dL (ref 8.9–10.3)
Chloride: 105 mmol/L (ref 98–111)
Creatinine, Ser: 1.36 mg/dL — ABNORMAL HIGH (ref 0.61–1.24)
GFR, Estimated: >60 mL/min (ref >60)
Glucose, Bld: 97 mg/dL (ref 70–99)
Potassium: 3.2 mmol/L — ABNORMAL LOW (ref 3.5–5.1)
Sodium: 145 mmol/L (ref 135–145)

## 2023-11-19 LAB — GLUCOSE, CAPILLARY: Glucose-Capillary: 135 mg/dL — ABNORMAL HIGH (ref 70–99)

## 2023-11-19 LAB — MAGNESIUM: Magnesium: 1.7 mg/dL (ref 1.7–2.4)

## 2023-11-19 LAB — HIV ANTIBODY (ROUTINE TESTING W REFLEX): HIV Screen 4th Generation wRfx: NONREACTIVE

## 2023-11-19 MED ORDER — CYCLOBENZAPRINE HCL 5 MG PO TABS
5.0000 mg | ORAL_TABLET | Freq: Three times a day (TID) | ORAL | Status: DC | PRN
  Administered 2023-11-19 – 2023-11-23 (×3): 5 mg via ORAL
  Filled 2023-11-19 (×3): qty 1

## 2023-11-19 NOTE — ED Notes (Signed)
 Report called to [facility].

## 2023-11-19 NOTE — ED Notes (Signed)
 Patient transported via [facility]. Patient stable upon transfer.

## 2023-11-19 NOTE — Progress Notes (Signed)
   11/19/23 1045  TOC Brief Assessment  Insurance and Status Reviewed  Patient has primary care physician Yes  Home environment has been reviewed home with spouse  Prior level of function: independent  Prior/Current Home Services No current home services  Social Drivers of Health Review SDOH reviewed no interventions necessary  Readmission risk has been reviewed Yes  Transition of care needs no transition of care needs at this time

## 2023-11-19 NOTE — Plan of Care (Signed)
  Problem: Education: Goal: Awareness of infection prevention will improve Outcome: Progressing   Problem: Self-Care: Goal: Ability to incorporate actions that prevent/reduce pain crisis will improve Outcome: Progressing   Problem: Bowel/Gastric: Goal: Gut motility will be maintained Outcome: Progressing   Problem: Tissue Perfusion: Goal: Complications related to inadequate tissue perfusion will be avoided or minimized Outcome: Progressing   Problem: Respiratory: Goal: Pulmonary complications will be avoided or minimized Outcome: Progressing   Problem: Sensory: Goal: Pain level will decrease with appropriate interventions Outcome: Progressing   Problem: Education: Goal: Knowledge of General Education information will improve Description: Including pain rating scale, medication(s)/side effects and non-pharmacologic comfort measures Outcome: Progressing   Problem: Clinical Measurements: Goal: Ability to maintain clinical measurements within normal limits will improve Outcome: Progressing Goal: Will remain free from infection Outcome: Progressing Goal: Diagnostic test results will improve Outcome: Progressing Goal: Respiratory complications will improve Outcome: Progressing Goal: Cardiovascular complication will be avoided Outcome: Progressing   Problem: Activity: Goal: Risk for activity intolerance will decrease Outcome: Progressing   Problem: Nutrition: Goal: Adequate nutrition will be maintained Outcome: Progressing   Problem: Elimination: Goal: Will not experience complications related to bowel motility Outcome: Progressing Goal: Will not experience complications related to urinary retention Outcome: Progressing   Problem: Pain Managment: Goal: General experience of comfort will improve and/or be controlled Outcome: Progressing   Problem: Safety: Goal: Ability to remain free from injury will improve Outcome: Progressing   Problem: Skin Integrity: Goal:  Risk for impaired skin integrity will decrease Outcome: Progressing

## 2023-11-19 NOTE — ED Notes (Signed)
 [facility] here to transport patient to [employer]

## 2023-11-19 NOTE — Progress Notes (Signed)
 SICKLE CELL SERVICE PROGRESS NOTE  Nicolas [name] EXB:[id number] DOB: December 12, 1975 DOA: 11/18/2023 PCP: Dorothyann Peng, MD  Assessment/Plan: Principal Problem:   Sickle cell pain crisis (HCC) Active Problems:   Hypokalemia   AKI (acute kidney injury) (HCC)   Chronic pain   HTN (hypertension)  Sickle cell pain crisis: Patient currently on Dilaudid PCA, No Toradol and IV fluids.  No oral on board.  Continue current treatment. Anemia of chronic disease, continue to monitor H&H. Essential hypertension: Continue amlodipine and Coreg AKI: Continue to monitor. Hypokalemia: Replete potassium Chronic pain syndrome: continue home regimen.  Code Status: Full code Family Communication: No family at bedside Disposition Plan: Home  [facility]  Pager [phone]. If 7PM-7AM, please contact night-coverage.  11/19/2023, 11:52 PM  LOS: 1 day   Brief narrative: Nicolas [name] is a 48 y.o. male with medical history significant for sickle cell anemia, hypertension, and osteoarthritis who presents with severe pain in his lower back and bilateral hips.   Patient began experiencing severe pain in the bilateral lower back and hips on 11/16/2023.  He denies any preceding trauma or inciting event but notes that he frequently experiences pain at the sites and attributes it to osteoarthritis and sickle cell disease.  He denies abdominal pain, nausea, vomiting, fever, or chills.    Consultants: None  Procedures: None  Antibiotics: None  HPI/Subjective: Patient's pain is at 9 out of 10.  On Dilaudid PCA.  No Toradol at the moment.  Objective: Vitals:   11/19/23 1521 11/19/23 1938 11/19/23 1942 11/19/23 2015  BP:    (!) 150/87  Pulse:    93  Resp: 18 18 18 16   Temp:    (!) 100.8 F (38.2 C)  TempSrc:    Oral  SpO2: 98% 99% 99% 100%  Weight:      Height:       Weight change:   Intake/Output Summary (Last 24 hours) at 11/19/2023 2352 Last data filed at 11/19/2023 1945 Gross per 24 hour  Intake  858.19 ml  Output 900 ml  Net -41.81 ml    General: Alert, awake, oriented x3, in no acute distress.  HEENT: [place]/AT PEERL, EOMI Neck: Trachea midline,  no masses, no thyromegal,y no JVD, no carotid bruit OROPHARYNX:  Moist, No exudate/ erythema/lesions.  Heart: Regular rate and rhythm, without murmurs, rubs, gallops, PMI non-displaced, no heaves or thrills on palpation.  Lungs: Clear to auscultation, no wheezing or rhonchi noted. No increased vocal fremitus resonant to percussion  Abdomen: Soft, nontender, nondistended, positive bowel sounds, no masses no hepatosplenomegaly noted..  Neuro: No focal neurological deficits noted cranial nerves II through XII grossly intact. DTRs 2+ bilaterally upper and lower extremities. Strength 5 out of 5 in bilateral upper and lower extremities. Musculoskeletal: No warm swelling or erythema around joints, no spinal tenderness noted. Psychiatric: Patient alert and oriented x3, good insight and cognition, good recent to remote recall. Lymph node survey: No cervical axillary or inguinal lymphadenopathy noted.   Data Reviewed: Basic Metabolic Panel: Recent Labs  Lab 11/18/23 1500 11/18/23 1511 11/19/23 0342  NA 147* 146* 145  K 2.9* 2.8* 3.2*  CL 105 106 105  CO2 27  --  26  GLUCOSE 117* 117* 97  BUN 10 10 11   CREATININE 1.75* 1.90* 1.36*  CALCIUM 9.3  --  8.9  MG  --   --  1.7   Liver Function Tests: Recent Labs  Lab 11/18/23 1500  AST 40  ALT 30  ALKPHOS 39  BILITOT 1.2  PROT 7.2  ALBUMIN 4.2   No results for input(s): "LIPASE", "AMYLASE" in the last 168 hours. No results for input(s): "AMMONIA" in the last 168 hours. CBC: Recent Labs  Lab 11/18/23 1500 11/18/23 1511  WBC 13.0*  --   NEUTROABS 9.7*  --   HGB 11.7* 12.6*  HCT 32.4* 37.0*  MCV 83.3  --   PLT 329  --    Cardiac Enzymes: No results for input(s): "CKTOTAL", "CKMB", "CKMBINDEX", "TROPONINI" in the last 168 hours. BNP (last 3 results) No results for input(s):  "BNP" in the last 8760 hours.  ProBNP (last 3 results) No results for input(s): "PROBNP" in the last 8760 hours.  CBG: Recent Labs  Lab 11/19/23 1653  GLUCAP 135*    No results found for this or any previous visit (from the past 240 hours).   Studies: CT Angio Abd/Pel W and/or Wo Contrast Result Date: 11/18/2023 CLINICAL DATA:  Severe flank pain, history of sickle cell disease. EXAM: CTA ABDOMEN AND PELVIS WITHOUT AND WITH CONTRAST TECHNIQUE: Multidetector CT imaging of the abdomen and pelvis was performed using the standard protocol during bolus administration of intravenous contrast. Multiplanar reconstructed images and MIPs were obtained and reviewed to evaluate the vascular anatomy. RADIATION DOSE REDUCTION: This exam was performed according to the departmental dose-optimization program which includes automated exposure control, adjustment of the mA and/or kV according to patient size and/or use of iterative reconstruction technique. CONTRAST:  75mL OMNIPAQUE IOHEXOL 350 MG/ML SOLN COMPARISON:  Prior CT scan of the chest 09/26/2016 FINDINGS: VASCULAR Aorta: Normal caliber aorta without aneurysm, dissection, vasculitis or significant stenosis. Celiac: Patent without evidence of aneurysm, dissection, vasculitis or significant stenosis. SMA: Patent without evidence of aneurysm, dissection, vasculitis or significant stenosis. Renals: Both renal arteries are patent without evidence of aneurysm, dissection, vasculitis, fibromuscular dysplasia or significant stenosis. IMA: Patent without evidence of aneurysm, dissection, vasculitis or significant stenosis. Inflow: Patent without evidence of aneurysm, dissection, vasculitis or significant stenosis. Proximal Outflow: Bilateral common femoral and visualized portions of the superficial and profunda femoral arteries are patent without evidence of aneurysm, dissection, vasculitis or significant stenosis. Veins: No focal venous abnormality. Review of the MIP  images confirms the above findings. NON-VASCULAR Lower chest: No acute abnormality. Chronic cardiomegaly likely related to high cardiac output in the setting of chronic anemia. Hepatobiliary: Normal hepatic contour and morphology. No discrete hepatic lesion. Distended gallbladder with mild hyperenhancement of the mucosa. Small stones visualized. No biliary ductal dilatation. Pancreas: Unremarkable. No pancreatic ductal dilatation or surrounding inflammatory changes. 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 Adrenals/Urinary Tract: Normal adrenal glands. No hydronephrosis, nephrolithiasis or enhancing renal mass. No evidence of renal infarct. The ureters are unremarkable. Limited evaluation of the bladder due to extensive streak artifact from bilateral hip arthroplasty prostheses. Stomach/Bowel: Normal appendix in the right lower quadrant. No focal bowel wall thickening or evidence of obstruction. Lymphatic: No suspicious lymphadenopathy. Reproductive: Limited evaluation due to streak artifact from bilateral hip arthroplasty prostheses. Other: No evidence of ascites.  Fat containing umbilical hernia. Musculoskeletal: Expected H-shaped vertebral body changes throughout the visualized spine. Surgical changes of bilateral hip joint arthroplasty. IMPRESSION: 1. No evidence of arterial or venous vascular abnormality. 2. Diffusely distended gallbladder with mild hyperenhancement of the mucosa and small stones. If the patient's flank pain is localized to the right, acute cholecystitis would be a consideration. Right upper quadrant ultrasound can further evaluate if clinically warranted. 3. No evidence of kidney stones or renal infarct. 4. Expected sequelae  of sickle cell disease including auto infarction of the spleen, H-shaped vertebral body changes and bilateral hip arthroplasty. 5. Fat containing umbilical hernia. Electronically Signed   By: Malachy Moan M.D.   On: 11/18/2023 16:03     Scheduled Meds:  amLODipine  10 mg Oral Daily   carvedilol  6.25 mg Oral BID WC   enoxaparin (LOVENOX) injection  40 mg Subcutaneous Q24H   HYDROmorphone   Intravenous Q4H   pravastatin  20 mg Oral q1800   senna-docusate  1 tablet Oral BID   Continuous Infusions:  Principal Problem:   Sickle cell pain crisis (HCC) Active Problems:   Hypokalemia   AKI (acute kidney injury) (HCC)   Chronic pain   HTN (hypertension)

## 2023-11-20 ENCOUNTER — Ambulatory Visit: Payer: 59 | Admitting: Family Medicine

## 2023-11-20 DIAGNOSIS — D5700 Hb-SS disease with crisis, unspecified: Secondary | ICD-10-CM | POA: Diagnosis not present

## 2023-11-20 LAB — CBC WITH DIFFERENTIAL/PLATELET
Abs Immature Granulocytes: 0.1 10*3/uL — ABNORMAL HIGH (ref 0.00–0.07)
Basophils Absolute: 0 10*3/uL (ref 0.0–0.1)
Basophils Relative: 0 %
Eosinophils Absolute: 0.3 10*3/uL (ref 0.0–0.5)
Eosinophils Relative: 2 %
HCT: 27.8 % — ABNORMAL LOW (ref 39.0–52.0)
Hemoglobin: 9.6 g/dL — ABNORMAL LOW (ref 13.0–17.0)
Immature Granulocytes: 1 %
Lymphocytes Relative: 11 %
Lymphs Abs: 1.4 10*3/uL (ref 0.7–4.0)
MCH: 29.4 pg (ref 26.0–34.0)
MCHC: 34.5 g/dL (ref 30.0–36.0)
MCV: 85 fL (ref 80.0–100.0)
Monocytes Absolute: 1 10*3/uL (ref 0.1–1.0)
Monocytes Relative: 8 %
Neutro Abs: 9.5 10*3/uL — ABNORMAL HIGH (ref 1.7–7.7)
Neutrophils Relative %: 78 %
Platelets: 276 10*3/uL (ref 150–400)
RBC: 3.27 MIL/uL — ABNORMAL LOW (ref 4.22–5.81)
RDW: 17.2 % — ABNORMAL HIGH (ref 11.5–15.5)
WBC: 12.2 10*3/uL — ABNORMAL HIGH (ref 4.0–10.5)
nRBC: 1.1 % — ABNORMAL HIGH (ref 0.0–0.2)

## 2023-11-20 LAB — COMPREHENSIVE METABOLIC PANEL
ALT: 24 U/L (ref 0–44)
AST: 40 U/L (ref 15–41)
Albumin: 3.5 g/dL (ref 3.5–5.0)
Alkaline Phosphatase: 42 U/L (ref 38–126)
Anion gap: 9 (ref 5–15)
BUN: 12 mg/dL (ref 6–20)
CO2: 28 mmol/L (ref 22–32)
Calcium: 8.6 mg/dL — ABNORMAL LOW (ref 8.9–10.3)
Chloride: 103 mmol/L (ref 98–111)
Creatinine, Ser: 1.39 mg/dL — ABNORMAL HIGH (ref 0.61–1.24)
GFR, Estimated: >60 mL/min (ref >60)
Glucose, Bld: 156 mg/dL — ABNORMAL HIGH (ref 70–99)
Potassium: 3.3 mmol/L — ABNORMAL LOW (ref 3.5–5.1)
Sodium: 140 mmol/L (ref 135–145)
Total Bilirubin: 1 mg/dL (ref 0.0–1.2)
Total Protein: 7.2 g/dL (ref 6.5–8.1)

## 2023-11-20 MED ORDER — TRIAMTERENE-HCTZ 37.5-25 MG PO TABS
1.0000 | ORAL_TABLET | Freq: Every day | ORAL | Status: DC
  Administered 2023-11-21 – 2023-11-26 (×6): 1 via ORAL
  Filled 2023-11-20 (×6): qty 1

## 2023-11-20 MED ORDER — IRBESARTAN 150 MG PO TABS
150.0000 mg | ORAL_TABLET | Freq: Every day | ORAL | Status: DC
  Administered 2023-11-21 – 2023-11-26 (×6): 150 mg via ORAL
  Filled 2023-11-20 (×6): qty 1

## 2023-11-20 MED ORDER — ACETAMINOPHEN 500 MG PO TABS
1000.0000 mg | ORAL_TABLET | Freq: Once | ORAL | Status: AC
  Administered 2023-11-20: 1000 mg via ORAL
  Filled 2023-11-20: qty 2

## 2023-11-20 NOTE — Plan of Care (Signed)
   Problem: Elimination: Goal: Will not experience complications related to urinary retention Outcome: Progressing   Problem: Pain Managment: Goal: General experience of comfort will improve and/or be controlled Outcome: Progressing   Problem: Safety: Goal: Ability to remain free from injury will improve Outcome: Progressing

## 2023-11-20 NOTE — Progress Notes (Addendum)
 SICKLE CELL SERVICE PROGRESS NOTE  Nicolas [name] WGN:[id number] DOB: 1975-10-07 DOA: 11/18/2023 PCP: Dorothyann Peng, MD  Assessment/Plan: Principal Problem:   Sickle cell pain crisis (HCC) Active Problems:   Hypokalemia   AKI (acute kidney injury) (HCC)   Chronic pain   HTN (hypertension)  Sickle cell pain crisis: Patient currently on Dilaudid PCA, No Toradol and IV fluids.  No oral on board.  Continue current treatment.  Will add Toradol.  Also add oral agent today. Anemia of chronic disease: Hemoglobin is 9.6.  Continue to monitor H&H. Essential hypertension: Continue amlodipine and Coreg AKI: Continue to monitor. Hypokalemia: Replete potassium Chronic pain syndrome: continue home regimen. Leukocytosis: White count now is 12.2.  Slowly improving.  Continue to monitor. Insulin-dependent diabetes: Sliding scale insulin.  Continue to monitor.  Code Status: Full code Family Communication: No family at bedside Disposition Plan: Home  [facility]  Pager [phone]. If 7PM-7AM, please contact night-coverage.  11/20/2023, 11:50 PM  LOS: 2 days   Brief narrative: Nicolas [name] is a 48 y.o. male with medical history significant for sickle cell anemia, hypertension, and osteoarthritis who presents with severe pain in his lower back and bilateral hips.   Patient began experiencing severe pain in the bilateral lower back and hips on 11/16/2023.  He denies any preceding trauma or inciting event but notes that he frequently experiences pain at the sites and attributes it to osteoarthritis and sickle cell disease.  He denies abdominal pain, nausea, vomiting, fever, or chills.    Consultants: None  Procedures: None  Antibiotics: None  HPI/Subjective: Patient's pain is at 7 out of 10.  On Dilaudid PCA.  Pain has slowly improved.  Objective: Vitals:   11/20/23 1628 11/20/23 1737 11/20/23 2000 11/20/23 2033  BP:  (!) 153/99    Pulse:  93    Resp: 18 17 16 16   Temp:  99.8 F (37.7  C)    TempSrc:  Oral    SpO2: 98% 100% 96% 96%  Weight:      Height:       Weight change:   Intake/Output Summary (Last 24 hours) at 11/20/2023 2350 Last data filed at 11/20/2023 1700 Gross per 24 hour  Intake 1010 ml  Output 1100 ml  Net -90 ml    General: Alert, awake, oriented x3, in no acute distress.  HEENT: [place]/AT PEERL, EOMI Neck: Trachea midline,  no masses, no thyromegal,y no JVD, no carotid bruit OROPHARYNX:  Moist, No exudate/ erythema/lesions.  Heart: Regular rate and rhythm, without murmurs, rubs, gallops, PMI non-displaced, no heaves or thrills on palpation.  Lungs: Clear to auscultation, no wheezing or rhonchi noted. No increased vocal fremitus resonant to percussion  Abdomen: Soft, nontender, nondistended, positive bowel sounds, no masses no hepatosplenomegaly noted..  Neuro: No focal neurological deficits noted cranial nerves II through XII grossly intact. DTRs 2+ bilaterally upper and lower extremities. Strength 5 out of 5 in bilateral upper and lower extremities. Musculoskeletal: No warm swelling or erythema around joints, no spinal tenderness noted. Psychiatric: Patient alert and oriented x3, good insight and cognition, good recent to remote recall. Lymph node survey: No cervical axillary or inguinal lymphadenopathy noted.   Data Reviewed: Basic Metabolic Panel: Recent Labs  Lab 11/18/23 1500 11/18/23 1511 11/19/23 0342 11/20/23 1031  NA 147* 146* 145 140  K 2.9* 2.8* 3.2* 3.3*  CL 105 106 105 103  CO2 27  --  26 28  GLUCOSE 117* 117* 97 156*  BUN 10 10 11 12   CREATININE  1.75* 1.90* 1.36* 1.39*  CALCIUM 9.3  --  8.9 8.6*  MG  --   --  1.7  --    Liver Function Tests: Recent Labs  Lab 11/18/23 1500 11/20/23 1031  AST 40 40  ALT 30 24  ALKPHOS 39 42  BILITOT 1.2 1.0  PROT 7.2 7.2  ALBUMIN 4.2 3.5   No results for input(s): "LIPASE", "AMYLASE" in the last 168 hours. No results for input(s): "AMMONIA" in the last 168 hours. CBC: Recent Labs   Lab 11/18/23 1500 11/18/23 1511 11/20/23 1031  WBC 13.0*  --  12.2*  NEUTROABS 9.7*  --  9.5*  HGB 11.7* 12.6* 9.6*  HCT 32.4* 37.0* 27.8*  MCV 83.3  --  85.0  PLT 329  --  276   Cardiac Enzymes: No results for input(s): "CKTOTAL", "CKMB", "CKMBINDEX", "TROPONINI" in the last 168 hours. BNP (last 3 results) No results for input(s): "BNP" in the last 8760 hours.  ProBNP (last 3 results) No results for input(s): "PROBNP" in the last 8760 hours.  CBG: Recent Labs  Lab 11/19/23 1653  GLUCAP 135*    No results found for this or any previous visit (from the past 240 hours).   Studies: CT Angio Abd/Pel W and/or Wo Contrast Result Date: 11/18/2023 CLINICAL DATA:  Severe flank pain, history of sickle cell disease. EXAM: CTA ABDOMEN AND PELVIS WITHOUT AND WITH CONTRAST TECHNIQUE: Multidetector CT imaging of the abdomen and pelvis was performed using the standard protocol during bolus administration of intravenous contrast. Multiplanar reconstructed images and MIPs were obtained and reviewed to evaluate the vascular anatomy. RADIATION DOSE REDUCTION: This exam was performed according to the departmental dose-optimization program which includes automated exposure control, adjustment of the mA and/or kV according to patient size and/or use of iterative reconstruction technique. CONTRAST:  75mL OMNIPAQUE IOHEXOL 350 MG/ML SOLN COMPARISON:  Prior CT scan of the chest 09/26/2016 FINDINGS: VASCULAR Aorta: Normal caliber aorta without aneurysm, dissection, vasculitis or significant stenosis. Celiac: Patent without evidence of aneurysm, dissection, vasculitis or significant stenosis. SMA: Patent without evidence of aneurysm, dissection, vasculitis or significant stenosis. Renals: Both renal arteries are patent without evidence of aneurysm, dissection, vasculitis, fibromuscular dysplasia or significant stenosis. IMA: Patent without evidence of aneurysm, dissection, vasculitis or significant stenosis.  Inflow: Patent without evidence of aneurysm, dissection, vasculitis or significant stenosis. Proximal Outflow: Bilateral common femoral and visualized portions of the superficial and profunda femoral arteries are patent without evidence of aneurysm, dissection, vasculitis or significant stenosis. Veins: No focal venous abnormality. Review of the MIP images confirms the above findings. NON-VASCULAR Lower chest: No acute abnormality. Chronic cardiomegaly likely related to high cardiac output in the setting of chronic anemia. Hepatobiliary: Normal hepatic contour and morphology. No discrete hepatic lesion. Distended gallbladder with mild hyperenhancement of the mucosa. Small stones visualized. No biliary ductal dilatation. Pancreas: Unremarkable. No pancreatic ductal dilatation or surrounding inflammatory changes. 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 Adrenals/Urinary Tract: Normal adrenal glands. No hydronephrosis, nephrolithiasis or enhancing renal mass. No evidence of renal infarct. The ureters are unremarkable. Limited evaluation of the bladder due to extensive streak artifact from bilateral hip arthroplasty prostheses. Stomach/Bowel: Normal appendix in the right lower quadrant. No focal bowel wall thickening or evidence of obstruction. Lymphatic: No suspicious lymphadenopathy. Reproductive: Limited evaluation due to streak artifact from bilateral hip arthroplasty prostheses. Other: No evidence of ascites.  Fat containing umbilical hernia. Musculoskeletal: Expected H-shaped vertebral body changes throughout the visualized spine. Surgical changes of bilateral  hip joint arthroplasty. IMPRESSION: 1. No evidence of arterial or venous vascular abnormality. 2. Diffusely distended gallbladder with mild hyperenhancement of the mucosa and small stones. If the patient's flank pain is localized to the right, acute cholecystitis would be a consideration. Right upper quadrant ultrasound can  further evaluate if clinically warranted. 3. No evidence of kidney stones or renal infarct. 4. Expected sequelae of sickle cell disease including auto infarction of the spleen, H-shaped vertebral body changes and bilateral hip arthroplasty. 5. Fat containing umbilical hernia. Electronically Signed   By: Malachy Moan M.D.   On: 11/18/2023 16:03    Scheduled Meds:  amLODipine  10 mg Oral Daily   carvedilol  6.25 mg Oral BID WC   enoxaparin (LOVENOX) injection  40 mg Subcutaneous Q24H   HYDROmorphone   Intravenous Q4H   pravastatin  20 mg Oral q1800   senna-docusate  1 tablet Oral BID   Continuous Infusions:  Principal Problem:   Sickle cell pain crisis (HCC) Active Problems:   Hypokalemia   AKI (acute kidney injury) (HCC)   Chronic pain   HTN (hypertension)

## 2023-11-21 DIAGNOSIS — D5700 Hb-SS disease with crisis, unspecified: Secondary | ICD-10-CM | POA: Diagnosis not present

## 2023-11-21 MED ORDER — OXYCODONE-ACETAMINOPHEN 5-325 MG PO TABS
1.0000 | ORAL_TABLET | Freq: Four times a day (QID) | ORAL | Status: DC | PRN
  Administered 2023-11-22 – 2023-11-24 (×2): 1 via ORAL
  Filled 2023-11-21 (×3): qty 1

## 2023-11-21 MED ORDER — OXYCODONE HCL 5 MG PO TABS
5.0000 mg | ORAL_TABLET | Freq: Four times a day (QID) | ORAL | Status: DC | PRN
  Administered 2023-11-22 – 2023-11-24 (×2): 5 mg via ORAL
  Filled 2023-11-21 (×2): qty 1

## 2023-11-21 MED ORDER — ACETAMINOPHEN 500 MG PO TABS
1000.0000 mg | ORAL_TABLET | Freq: Once | ORAL | Status: AC
  Administered 2023-11-21: 1000 mg via ORAL
  Filled 2023-11-21: qty 2

## 2023-11-21 NOTE — Progress Notes (Signed)
 Subjective: Nicolas [name] is a 48 year old male with a medical history significant for sickle cell anemia, hypertension, type 2 diabetes mellitus, and osteoarthritis that was admitted for sickle cell pain crisis. Patient continues to have significant pain primarily to low back and lower extremities. He denies any headache, chest pain, urinary symptoms, nausea, vomiting, or diarrhea.  Objective:  Vital signs in last 24 hours:  Vitals:   11/21/23 0554 11/21/23 0755 11/21/23 0959 11/21/23 1132  BP: (!) 177/89  (!) 135/90   Pulse: (!) 105  99   Resp: 16 18 17 16   Temp: (!) 100.4 F (38 C)  98.3 F (36.8 C)   TempSrc: Oral     SpO2: 96%  100%   Weight:      Height:        Intake/Output from previous day:   Intake/Output Summary (Last 24 hours) at 11/21/2023 1253 Last data filed at 11/21/2023 1018 Gross per 24 hour  Intake 1120 ml  Output 1050 ml  Net 70 ml    Physical Exam: General: Alert, awake, oriented x3, in no acute distress.  HEENT: [place]/AT PEERL, EOMI Neck: Trachea midline,  no masses, no thyromegal,y no JVD, no carotid bruit OROPHARYNX:  Moist, No exudate/ erythema/lesions.  Heart: Regular rate and rhythm, without murmurs, rubs, gallops, PMI non-displaced, no heaves or thrills on palpation.  Lungs: Clear to auscultation, no wheezing or rhonchi noted. No increased vocal fremitus resonant to percussion  Abdomen: Soft, nontender, nondistended, positive bowel sounds, no masses no hepatosplenomegaly noted..  Neuro: No focal neurological deficits noted cranial nerves II through XII grossly intact. DTRs 2+ bilaterally upper and lower extremities. Strength 5 out of 5 in bilateral upper and lower extremities. Musculoskeletal: No warm swelling or erythema around joints, no spinal tenderness noted. Psychiatric: Patient alert and oriented x3, good insight and cognition, good recent to remote recall. Lymph node survey: No cervical axillary or inguinal lymphadenopathy noted.  Lab  Results:  Basic Metabolic Panel:    Component Value Date/Time   NA 140 11/20/2023 1031   NA 144 06/23/2023 1534   K 3.3 (L) 11/20/2023 1031   CL 103 11/20/2023 1031   CO2 28 11/20/2023 1031   BUN 12 11/20/2023 1031   BUN 22 06/23/2023 1534   CREATININE 1.39 (H) 11/20/2023 1031   GLUCOSE 156 (H) 11/20/2023 1031   CALCIUM 8.6 (L) 11/20/2023 1031   CBC:    Component Value Date/Time   WBC 12.2 (H) 11/20/2023 1031   HGB 9.6 (L) 11/20/2023 1031   HGB 12.2 (L) 06/04/2022 1458   HCT 27.8 (L) 11/20/2023 1031   HCT 36.7 (L) 06/04/2022 1458   PLT 276 11/20/2023 1031   PLT 448 06/04/2022 1458   MCV 85.0 11/20/2023 1031   MCV 89 06/04/2022 1458   NEUTROABS 9.5 (H) 11/20/2023 1031   NEUTROABS 5.7 06/04/2022 1458   LYMPHSABS 1.4 11/20/2023 1031   LYMPHSABS 2.6 06/04/2022 1458   MONOABS 1.0 11/20/2023 1031   EOSABS 0.3 11/20/2023 1031   EOSABS 0.2 06/04/2022 1458   BASOSABS 0.0 11/20/2023 1031   BASOSABS 0.1 06/04/2022 1458    No results found for this or any previous visit (from the past 240 hours).  Studies/Results: No results found.  Medications: Scheduled Meds:  amLODipine  10 mg Oral Daily   carvedilol  6.25 mg Oral BID WC   enoxaparin (LOVENOX) injection  40 mg Subcutaneous Q24H   HYDROmorphone   Intravenous Q4H   irbesartan  150 mg Oral Daily   pravastatin  20 mg Oral q1800   senna-docusate  1 tablet Oral BID   triamterene-hydrochlorothiazide  1 tablet Oral Daily   Continuous Infusions: PRN Meds:.cyclobenzaprine, diphenhydrAMINE, naloxone **AND** sodium chloride flush, ondansetron (ZOFRAN) IV, polyethylene glycol  Consultants: none  Procedures: none  Antibiotics: none  Assessment/Plan: Principal Problem:   Sickle cell pain crisis (HCC) Active Problems:   Hypokalemia   AKI (acute kidney injury) (HCC)   Chronic pain   HTN (hypertension)  Sickle cell disease with pain crisis: Weaning IV Dilaudid PCA Restart patient's home medications.  Percocet 10-325  mg every 6 hours as needed for severe breakthrough pain Monitor vital signs very closely, reevaluate pain scale regularly, and supplemental oxygen as needed  Hypertension: Stable.  Continue home medications.  Monitor closely.  Hyperlipidemia: Stable.  Continue home medications.  Type 2 diabetes mellitus: Monitor closely.  Sensitive scale insulin.  Leukocytosis: Stable.  Improving.  Monitor closely.  Labs in AM.  Hypokalemia: Replete potassium.  Labs in AM.  Acute kidney injury: Improving.  Continue to monitor closely.  Awaiting labs for today.    Code Status: Full Code Family Communication: N/A Disposition Plan: Not yet ready for discharge  Nolon Nations  DNP, APRN, FNP-C [facility] [address] [place], [place] [address] [phone]  If 7PM-7AM, please contact night-coverage.  11/21/2023, 12:53 PM  LOS: 3 days

## 2023-11-21 NOTE — Plan of Care (Signed)
   Problem: Elimination: Goal: Will not experience complications related to urinary retention Outcome: Progressing   Problem: Pain Managment: Goal: General experience of comfort will improve and/or be controlled Outcome: Progressing   Problem: Safety: Goal: Ability to remain free from injury will improve Outcome: Progressing

## 2023-11-22 ENCOUNTER — Inpatient Hospital Stay (HOSPITAL_COMMUNITY): Payer: 59

## 2023-11-22 DIAGNOSIS — D5700 Hb-SS disease with crisis, unspecified: Secondary | ICD-10-CM | POA: Diagnosis not present

## 2023-11-22 LAB — BASIC METABOLIC PANEL
Anion gap: 10 (ref 5–15)
BUN: 17 mg/dL (ref 6–20)
CO2: 30 mmol/L (ref 22–32)
Calcium: 9 mg/dL (ref 8.9–10.3)
Chloride: 101 mmol/L (ref 98–111)
Creatinine, Ser: 1.74 mg/dL — ABNORMAL HIGH (ref 0.61–1.24)
GFR, Estimated: 47 mL/min — ABNORMAL LOW (ref >60)
Glucose, Bld: 165 mg/dL — ABNORMAL HIGH (ref 70–99)
Potassium: 3 mmol/L — ABNORMAL LOW (ref 3.5–5.1)
Sodium: 141 mmol/L (ref 135–145)

## 2023-11-22 LAB — CBC
HCT: 24.9 % — ABNORMAL LOW (ref 39.0–52.0)
Hemoglobin: 8.6 g/dL — ABNORMAL LOW (ref 13.0–17.0)
MCH: 29.3 pg (ref 26.0–34.0)
MCHC: 34.5 g/dL (ref 30.0–36.0)
MCV: 84.7 fL (ref 80.0–100.0)
Platelets: 273 10*3/uL (ref 150–400)
RBC: 2.94 MIL/uL — ABNORMAL LOW (ref 4.22–5.81)
RDW: 17.1 % — ABNORMAL HIGH (ref 11.5–15.5)
WBC: 13.2 10*3/uL — ABNORMAL HIGH (ref 4.0–10.5)
nRBC: 0.3 % — ABNORMAL HIGH (ref 0.0–0.2)

## 2023-11-22 LAB — URINALYSIS, COMPLETE (UACMP) WITH MICROSCOPIC
Bacteria, UA: NONE SEEN
Bilirubin Urine: NEGATIVE
Glucose, UA: NEGATIVE mg/dL
Ketones, ur: NEGATIVE mg/dL
Leukocytes,Ua: NEGATIVE
Nitrite: NEGATIVE
Protein, ur: 100 mg/dL — AB
Specific Gravity, Urine: 1.012 (ref 1.005–1.030)
pH: 5 (ref 5.0–8.0)

## 2023-11-22 LAB — RESP PANEL BY RT-PCR (RSV, FLU A&B, COVID)  RVPGX2
Influenza A by PCR: NEGATIVE
Influenza B by PCR: NEGATIVE
Resp Syncytial Virus by PCR: NEGATIVE
SARS Coronavirus 2 by RT PCR: NEGATIVE

## 2023-11-22 LAB — LACTIC ACID, PLASMA: Lactic Acid, Venous: 1.3 mmol/L (ref 0.5–1.9)

## 2023-11-22 MED ORDER — ACETAMINOPHEN 325 MG PO TABS
650.0000 mg | ORAL_TABLET | ORAL | Status: DC | PRN
  Administered 2023-11-22 – 2023-11-23 (×3): 650 mg via ORAL
  Filled 2023-11-22 (×4): qty 2

## 2023-11-22 MED ORDER — ACETAMINOPHEN 325 MG PO TABS
650.0000 mg | ORAL_TABLET | Freq: Once | ORAL | Status: AC
  Administered 2023-11-22: 650 mg via ORAL
  Filled 2023-11-22: qty 2

## 2023-11-22 MED ORDER — SODIUM CHLORIDE 0.9 % IV SOLN
1.0000 g | INTRAVENOUS | Status: DC
Start: 2023-11-22 — End: 2023-11-26
  Administered 2023-11-22 – 2023-11-25 (×4): 1 g via INTRAVENOUS
  Filled 2023-11-22 (×5): qty 10

## 2023-11-22 NOTE — Progress Notes (Signed)
 Morning vitals temp 101.2, b/p 171/79, HR 104. NP notified for more Tylenol for fever. No new concerns but wanted a second opinion regarding pt care. Pt physically appeared uncomfortable, and like he was struggling to breath. Rapid Response nurse and NP did come to patient beside. Order for Tylenol and chest x-ray order. Patient was encouraged by NP and RR nurse to use incentive spirometer. Will pass on to day shift nurse concerns and current status.

## 2023-11-22 NOTE — Plan of Care (Signed)

## 2023-11-22 NOTE — Plan of Care (Signed)
 Problem: Education: Goal: Knowledge of vaso-occlusive preventative measures will improve Outcome: Progressing   Problem: Self-Care: Goal: Ability to incorporate actions that prevent/reduce pain crisis will improve Outcome: Progressing   Problem: Respiratory: Goal: Pulmonary complications will be avoided or minimized Outcome: Progressing   Problem: Clinical Measurements: Goal: Ability to maintain clinical measurements within normal limits will improve Outcome: Progressing   Problem: Pain Managment: Goal: General experience of comfort will improve and/or be controlled Outcome: Progressing   Problem: Safety: Goal: Ability to remain free from injury will improve Outcome: Progressing   Haydee Salter, RN 11/22/23 2:27 PM

## 2023-11-22 NOTE — Progress Notes (Addendum)
     Patient Name: Nicolas [name]           DOB: 02-16-1976  MRN: [id number]      Admission Date: 11/18/2023  Attending Provider: Quentin Angst, MD  Primary Diagnosis: Sickle cell pain crisis (HCC)   Level of care: Med-Surg   OVERNIGHT PROGRESS REPORT   Febrile, 89 F Denies chills, cough, sputum production, nausea, vomiting, diarrhea, or urinary symptoms. Prior WBC 12.2.  Plan:  Obtaining UA, blood cultures, chest xray, lactic acid, CMP, CBC, respiratory panel. Hold off on antibiotics.  Monitor fever curve. Tylenol for fever Incentive spirometry    Anthoney Harada, DNP, ACNPC- AG [facility]

## 2023-11-22 NOTE — Progress Notes (Signed)
   11/22/23 1315  Assess: MEWS Score  Temp (!) 102.8 F (39.3 C)  BP (!) 162/96  MAP (mmHg) 117  Pulse Rate (!) 102  Resp 16  SpO2 96 %  O2 Device Nasal Cannula  Assess: MEWS Score  MEWS Temp 2  MEWS Systolic 0  MEWS Pulse 1  MEWS RR 0  MEWS LOC 0  MEWS Score 3  MEWS Score Color Yellow  Assess: if the MEWS score is Yellow or Red  Were vital signs accurate and taken at a resting state? Yes  Does the patient meet 2 or more of the SIRS criteria? Yes  Does the patient have a confirmed or suspected source of infection? No  MEWS guidelines implemented  Yes, yellow  Treat  MEWS Interventions Considered administering scheduled or prn medications/treatments as ordered  Take Vital Signs  Increase Vital Sign Frequency  Yellow: Q2hr x1, continue Q4hrs until patient remains green for 12hrs  Escalate  MEWS: Escalate Yellow: Discuss with charge nurse and consider notifying provider and/or RRT  Notify: Charge Nurse/RN  Name of Charge Nurse/RN Notified Katy Apo, RN  Provider Notification  Provider Name/Title Dr. Leander Rams NP  Date Provider Notified 11/22/23  Time Provider Notified 1327  Method of Notification Page (electronic communication)  Notification Reason Change in status (Notified of change in VS and requested tylenol)  Provider response See new orders  Date of Provider Response 11/22/23  Time of Provider Response 1331  Assess: SIRS CRITERIA  SIRS Temperature  1  SIRS Respirations  0  SIRS Pulse 1  SIRS WBC 0  SIRS Score Sum  2   Haydee Salter, RN 11/22/23

## 2023-11-22 NOTE — Progress Notes (Cosign Needed Addendum)
 Subjective: Nicolas [name] is a 48 year old male with a medical history significant for sickle cell anemia, hypertension, type 2 diabetes mellitus, and osteoarthritis that was admitted for sickle cell pain crisis. No new complaints on today.  Patient continues to have significant pain primarily to low back and lower extremities. Patient febrile overnight, with max temp of 101.2. Chest xray shows low lung volumes without acute cardiopulmonary process. Blood cultures negative thus far.  He denies any headache, chest pain, urinary symptoms, nausea, vomiting, or diarrhea.  Objective:  Vital signs in last 24 hours:  Vitals:   11/22/23 0602 11/22/23 0640 11/22/23 0830 11/22/23 1028  BP: (!) 171/79 (!) 142/89  (!) 148/83  Pulse: (!) 104 (!) 105  93  Resp: 17 18 18 17   Temp: (!) 101.2 F (38.4 C) 100.1 F (37.8 C)  99 F (37.2 C)  TempSrc:  Oral  Oral  SpO2: 99% 99% 99% 98%  Weight:      Height:        Intake/Output from previous day:   Intake/Output Summary (Last 24 hours) at 11/22/2023 1108 Last data filed at 11/22/2023 0946 Gross per 24 hour  Intake 240 ml  Output 1450 ml  Net -1210 ml    Physical Exam: General: Alert, awake, oriented x3, in no acute distress.  HEENT: [place]/AT PEERL, EOMI Neck: Trachea midline,  no masses, no thyromegal,y no JVD, no carotid bruit OROPHARYNX:  Moist, No exudate/ erythema/lesions.  Heart: Regular rate and rhythm, without murmurs, rubs, gallops, PMI non-displaced, no heaves or thrills on palpation.  Lungs: Clear to auscultation, no wheezing or rhonchi noted. No increased vocal fremitus resonant to percussion  Abdomen: Soft, nontender, nondistended, positive bowel sounds, no masses no hepatosplenomegaly noted..  Neuro: No focal neurological deficits noted cranial nerves II through XII grossly intact. DTRs 2+ bilaterally upper and lower extremities. Strength 5 out of 5 in bilateral upper and lower extremities. Musculoskeletal: No warm swelling or  erythema around joints, no spinal tenderness noted. Psychiatric: Patient alert and oriented x3, good insight and cognition, good recent to remote recall. Lymph node survey: No cervical axillary or inguinal lymphadenopathy noted.  Lab Results:  Basic Metabolic Panel:    Component Value Date/Time   NA 141 11/22/2023 0201   NA 144 06/23/2023 1534   K 3.0 (L) 11/22/2023 0201   CL 101 11/22/2023 0201   CO2 30 11/22/2023 0201   BUN 17 11/22/2023 0201   BUN 22 06/23/2023 1534   CREATININE 1.74 (H) 11/22/2023 0201   GLUCOSE 165 (H) 11/22/2023 0201   CALCIUM 9.0 11/22/2023 0201   CBC:    Component Value Date/Time   WBC 13.2 (H) 11/22/2023 0201   HGB 8.6 (L) 11/22/2023 0201   HGB 12.2 (L) 06/04/2022 1458   HCT 24.9 (L) 11/22/2023 0201   HCT 36.7 (L) 06/04/2022 1458   PLT 273 11/22/2023 0201   PLT 448 06/04/2022 1458   MCV 84.7 11/22/2023 0201   MCV 89 06/04/2022 1458   NEUTROABS 9.5 (H) 11/20/2023 1031   NEUTROABS 5.7 06/04/2022 1458   LYMPHSABS 1.4 11/20/2023 1031   LYMPHSABS 2.6 06/04/2022 1458   MONOABS 1.0 11/20/2023 1031   EOSABS 0.3 11/20/2023 1031   EOSABS 0.2 06/04/2022 1458   BASOSABS 0.0 11/20/2023 1031   BASOSABS 0.1 06/04/2022 1458    Recent Results (from the past 240 hours)  Resp panel by RT-PCR (RSV, Flu A&B, Covid) Anterior Nasal Swab     Status: None   Collection Time: 11/22/23  1:00 AM  Specimen: Anterior Nasal Swab  Result Value Ref Range Status   SARS Coronavirus 2 by RT PCR NEGATIVE NEGATIVE Final    Comment: (NOTE) SARS-CoV-2 target nucleic acids are NOT DETECTED.  The SARS-CoV-2 RNA is generally detectable in upper respiratory specimens during the acute phase of infection. The lowest concentration of SARS-CoV-2 viral copies this assay can detect is 138 copies/mL. A negative result does not preclude SARS-Cov-2 infection and should not be used as the sole basis for treatment or other patient management decisions. A negative result may occur with   improper specimen collection/handling, submission of specimen other than nasopharyngeal swab, presence of viral mutation(s) within the areas targeted by this assay, and inadequate number of viral copies(<138 copies/mL). A negative result must be combined with clinical observations, patient history, and epidemiological information. The expected result is Negative.  Fact Sheet for Patients:  BloggerCourse.com  Fact Sheet for Healthcare Providers:  SeriousBroker.it  This test is no t yet approved or cleared by the [place] FDA and  has been authorized for detection and/or diagnosis of SARS-CoV-2 by FDA under an Emergency Use Authorization (EUA). This EUA will remain  in effect (meaning this test can be used) for the duration of the COVID-19 declaration under Section 564(b)(1) of the Act, 21 U.S.C.section 360bbb-3(b)(1), unless the authorization is terminated  or revoked sooner.       Influenza A by PCR NEGATIVE NEGATIVE Final   Influenza B by PCR NEGATIVE NEGATIVE Final    Comment: (NOTE) The Xpert Xpress SARS-CoV-2/FLU/RSV plus assay is intended as an aid in the diagnosis of influenza from Nasopharyngeal swab specimens and should not be used as a sole basis for treatment. Nasal washings and aspirates are unacceptable for Xpert Xpress SARS-CoV-2/FLU/RSV testing.  Fact Sheet for Patients: BloggerCourse.com  Fact Sheet for Healthcare Providers: SeriousBroker.it  This test is not yet approved or cleared by the [place] FDA and has been authorized for detection and/or diagnosis of SARS-CoV-2 by FDA under an Emergency Use Authorization (EUA). This EUA will remain in effect (meaning this test can be used) for the duration of the COVID-19 declaration under Section 564(b)(1) of the Act, 21 U.S.C. section 360bbb-3(b)(1), unless the authorization is terminated or revoked.      Resp Syncytial Virus by PCR NEGATIVE NEGATIVE Final    Comment: (NOTE) Fact Sheet for Patients: BloggerCourse.com  Fact Sheet for Healthcare Providers: SeriousBroker.it  This test is not yet approved or cleared by the [place] FDA and has been authorized for detection and/or diagnosis of SARS-CoV-2 by FDA under an Emergency Use Authorization (EUA). This EUA will remain in effect (meaning this test can be used) for the duration of the COVID-19 declaration under Section 564(b)(1) of the Act, 21 U.S.C. section 360bbb-3(b)(1), unless the authorization is terminated or revoked.  Performed at [facility], [address], [place], [place] [address]     Studies/Results: DG Chest Port 1 View Result Date: 11/22/2023 CLINICAL DATA:  History of sickle cell anemia with fever EXAM: PORTABLE CHEST 1 VIEW COMPARISON:  Chest radiograph dated 10/14/2022 FINDINGS: Low lung volumes with bronchovascular crowding. No focal consolidations. No pleural effusion or pneumothorax. The heart size and mediastinal contours are within normal limits. 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 IMPRESSION: Low lung volumes with bronchovascular crowding. No focal consolidations. Electronically Signed   By: Agustin Cree M.D.   On: 11/22/2023 10:21    Medications: Scheduled Meds:  amLODipine  10 mg Oral Daily   carvedilol  6.25 mg Oral BID WC   enoxaparin (LOVENOX) injection  40 mg Subcutaneous Q24H   HYDROmorphone   Intravenous Q4H   irbesartan  150 mg Oral Daily   pravastatin  20 mg Oral q1800   senna-docusate  1 tablet Oral BID   triamterene-hydrochlorothiazide  1 tablet Oral Daily   Continuous Infusions: PRN Meds:.cyclobenzaprine, diphenhydrAMINE, naloxone **AND** sodium chloride flush, ondansetron (ZOFRAN) IV, oxyCODONE-acetaminophen **AND** oxyCODONE, polyethylene  glycol  Consultants: none  Procedures: none  Antibiotics: none  Assessment/Plan: Principal Problem:   Sickle cell pain crisis (HCC) Active Problems:   Hypokalemia   AKI (acute kidney injury) (HCC)   Chronic pain   HTN (hypertension)  Sickle cell disease with pain crisis: Weaning IV Dilaudid PCA  Percocet 10-325 mg every 6 hours as needed for severe breakthrough pain Monitor vital signs very closely, reevaluate pain scale regularly, and supplemental oxygen as needed  SIRS:  Initiate IV antibiotics.  Chest xray shows no acute cardiopulmonary process. Blood cultures negative. Continue to monitor closely.   Hypertension: Stable.  Continue home medications.  Monitor closely.  Hyperlipidemia: Stable.  Continue home medications.  Type 2 diabetes mellitus: Monitor closely.  Sensitive scale insulin.  Leukocytosis: Stable.  Improving.  Monitor closely.  Labs in AM.  Hypokalemia: Replete potassium.  Labs in AM.  Acute kidney injury: Improving.  Continue to monitor closely.  Awaiting labs for today.    Code Status: Full Code Family Communication: N/A Disposition Plan: Not yet ready for discharge  Nolon Nations  DNP, APRN, FNP-C [facility] [address] [place], [place] [address] [phone]  If 7PM-7AM, please contact night-coverage.  11/22/2023, 11:08 AM  LOS: 4 days

## 2023-11-22 NOTE — Progress Notes (Signed)
 Patient encouraged this shift to cough, deep breathe, and use incentive spirometer. He sat up in his chair and ambulated for a short distance in his room today. He was too weak to ambulate into hallway. Medical team notified with vital sign changes and new orders for antibiotic. Nursing staff will continue to monitor patient closely. Haydee Salter, RN 11/22/23 8:01 PM

## 2023-11-23 LAB — CBC WITH DIFFERENTIAL/PLATELET
Abs Immature Granulocytes: 0.07 10*3/uL (ref 0.00–0.07)
Basophils Absolute: 0 10*3/uL (ref 0.0–0.1)
Basophils Relative: 0 %
Eosinophils Absolute: 0.2 10*3/uL (ref 0.0–0.5)
Eosinophils Relative: 1 %
HCT: 24 % — ABNORMAL LOW (ref 39.0–52.0)
Hemoglobin: 8.4 g/dL — ABNORMAL LOW (ref 13.0–17.0)
Immature Granulocytes: 1 %
Lymphocytes Relative: 7 %
Lymphs Abs: 0.9 10*3/uL (ref 0.7–4.0)
MCH: 29.4 pg (ref 26.0–34.0)
MCHC: 35 g/dL (ref 30.0–36.0)
MCV: 83.9 fL (ref 80.0–100.0)
Monocytes Absolute: 1 10*3/uL (ref 0.1–1.0)
Monocytes Relative: 8 %
Neutro Abs: 10.1 10*3/uL — ABNORMAL HIGH (ref 1.7–7.7)
Neutrophils Relative %: 83 %
Platelets: 286 10*3/uL (ref 150–400)
RBC: 2.86 MIL/uL — ABNORMAL LOW (ref 4.22–5.81)
RDW: 17.7 % — ABNORMAL HIGH (ref 11.5–15.5)
WBC: 12.3 10*3/uL — ABNORMAL HIGH (ref 4.0–10.5)
nRBC: 0.2 % (ref 0.0–0.2)

## 2023-11-23 LAB — COMPREHENSIVE METABOLIC PANEL
ALT: 32 U/L (ref 0–44)
AST: 72 U/L — ABNORMAL HIGH (ref 15–41)
Albumin: 3.1 g/dL — ABNORMAL LOW (ref 3.5–5.0)
Alkaline Phosphatase: 45 U/L (ref 38–126)
Anion gap: 11 (ref 5–15)
BUN: 22 mg/dL — ABNORMAL HIGH (ref 6–20)
CO2: 29 mmol/L (ref 22–32)
Calcium: 9 mg/dL (ref 8.9–10.3)
Chloride: 101 mmol/L (ref 98–111)
Creatinine, Ser: 1.41 mg/dL — ABNORMAL HIGH (ref 0.61–1.24)
GFR, Estimated: >60 mL/min (ref >60)
Glucose, Bld: 171 mg/dL — ABNORMAL HIGH (ref 70–99)
Potassium: 3.3 mmol/L — ABNORMAL LOW (ref 3.5–5.1)
Sodium: 141 mmol/L (ref 135–145)
Total Bilirubin: 1.1 mg/dL (ref 0.0–1.2)
Total Protein: 7.3 g/dL (ref 6.5–8.1)

## 2023-11-23 NOTE — Plan of Care (Signed)

## 2023-11-23 NOTE — Progress Notes (Signed)
 SICKLE CELL SERVICE PROGRESS NOTE  Nicolas [name] ZOX:[id number] DOB: 1976/08/06 DOA: 11/18/2023 PCP: Dorothyann Peng, MD  Assessment/Plan: Principal Problem:   Sickle cell pain crisis (HCC) Active Problems:   Hypokalemia   AKI (acute kidney injury) (HCC)   Chronic pain   HTN (hypertension)  Sickle cell pain crisis: Patient currently on Dilaudid PCA, No Toradol and IV fluids.  Patient's pain is at 7 out of 10 today.  Continue current regimen Anemia of chronic disease: Hemoglobin is 9.6.  Continue to monitor H&H. Essential hypertension: Continue amlodipine and Coreg AKI: Continue to monitor. Hypokalemia: Potassium is 3.3 continue to replete potassium Chronic pain syndrome: continue home regimen. Leukocytosis: White count now is 12.2.  Slowly improving.  Continue to monitor. Insulin-dependent diabetes: Sliding scale insulin.  Continue to monitor.  Code Status: Full code Family Communication: No family at bedside Disposition Plan: Home  [facility]  Pager [phone]. If 7PM-7AM, please contact night-coverage.  11/23/2023, 8:23 AM  LOS: 5 days   Brief narrative: Nicolas [name] is a 48 y.o. male with medical history significant for sickle cell anemia, hypertension, and osteoarthritis who presents with severe pain in his lower back and bilateral hips.   Patient began experiencing severe pain in the bilateral lower back and hips on 11/16/2023.  He denies any preceding trauma or inciting event but notes that he frequently experiences pain at the sites and attributes it to osteoarthritis and sickle cell disease.  He denies abdominal pain, nausea, vomiting, fever, or chills.    Consultants: None  Procedures: None  Antibiotics: None  HPI/Subjective: Patient's pain is at 7 out of 10.  On Dilaudid PCA.  Pain has slowly improved.  Also potassium 3.3  Objective: Vitals:   11/23/23 0100 11/23/23 0345 11/23/23 0425 11/23/23 0801  BP:   (!) 166/93   Pulse:   91   Resp:  17 16 16    Temp: 100.1 F (37.8 C)  98.5 F (36.9 C)   TempSrc: Oral  Oral   SpO2:  96% 98% 100%  Weight:      Height:       Weight change:   Intake/Output Summary (Last 24 hours) at 11/23/2023 0823 Last data filed at 11/23/2023 0801 Gross per 24 hour  Intake 820 ml  Output 1675 ml  Net -855 ml    General: Alert, awake, oriented x3, in no acute distress.  HEENT: [place]/AT PEERL, EOMI Neck: Trachea midline,  no masses, no thyromegal,y no JVD, no carotid bruit OROPHARYNX:  Moist, No exudate/ erythema/lesions.  Heart: Regular rate and rhythm, without murmurs, rubs, gallops, PMI non-displaced, no heaves or thrills on palpation.  Lungs: Clear to auscultation, no wheezing or rhonchi noted. No increased vocal fremitus resonant to percussion  Abdomen: Soft, nontender, nondistended, positive bowel sounds, no masses no hepatosplenomegaly noted..  Neuro: No focal neurological deficits noted cranial nerves II through XII grossly intact. DTRs 2+ bilaterally upper and lower extremities. Strength 5 out of 5 in bilateral upper and lower extremities. Musculoskeletal: No warm swelling or erythema around joints, no spinal tenderness noted. Psychiatric: Patient alert and oriented x3, good insight and cognition, good recent to remote recall. Lymph node survey: No cervical axillary or inguinal lymphadenopathy noted.   Data Reviewed: Basic Metabolic Panel: Recent Labs  Lab 11/18/23 1500 11/18/23 1511 11/19/23 0342 11/20/23 1031 11/22/23 0201  NA 147* 146* 145 140 141  K 2.9* 2.8* 3.2* 3.3* 3.0*  CL 105 106 105 103 101  CO2 27  --  26 28 30  GLUCOSE 117* 117* 97 156* 165*  BUN 10 10 11 12 17   CREATININE 1.75* 1.90* 1.36* 1.39* 1.74*  CALCIUM 9.3  --  8.9 8.6* 9.0  MG  --   --  1.7  --   --    Liver Function Tests: Recent Labs  Lab 11/18/23 1500 11/20/23 1031  AST 40 40  ALT 30 24  ALKPHOS 39 42  BILITOT 1.2 1.0  PROT 7.2 7.2  ALBUMIN 4.2 3.5   No results for input(s): "LIPASE", "AMYLASE" in  the last 168 hours. No results for input(s): "AMMONIA" in the last 168 hours. CBC: Recent Labs  Lab 11/18/23 1500 11/18/23 1511 11/20/23 1031 11/22/23 0201  WBC 13.0*  --  12.2* 13.2*  NEUTROABS 9.7*  --  9.5*  --   HGB 11.7* 12.6* 9.6* 8.6*  HCT 32.4* 37.0* 27.8* 24.9*  MCV 83.3  --  85.0 84.7  PLT 329  --  276 273   Cardiac Enzymes: No results for input(s): "CKTOTAL", "CKMB", "CKMBINDEX", "TROPONINI" in the last 168 hours. BNP (last 3 results) No results for input(s): "BNP" in the last 8760 hours.  ProBNP (last 3 results) No results for input(s): "PROBNP" in the last 8760 hours.  CBG: Recent Labs  Lab 11/19/23 1653  GLUCAP 135*    Recent Results (from the past 240 hours)  Resp panel by RT-PCR (RSV, Flu A&B, Covid) Anterior Nasal Swab     Status: None   Collection Time: 11/22/23  1:00 AM   Specimen: Anterior Nasal Swab  Result Value Ref Range Status   SARS Coronavirus 2 by RT PCR NEGATIVE NEGATIVE Final    Comment: (NOTE) SARS-CoV-2 target nucleic acids are NOT DETECTED.  The SARS-CoV-2 RNA is generally detectable in upper respiratory specimens during the acute phase of infection. The lowest concentration of SARS-CoV-2 viral copies this assay can detect is 138 copies/mL. A negative result does not preclude SARS-Cov-2 infection and should not be used as the sole basis for treatment or other patient management decisions. A negative result may occur with  improper specimen collection/handling, submission of specimen other than nasopharyngeal swab, presence of viral mutation(s) within the areas targeted by this assay, and inadequate number of viral copies(<138 copies/mL). A negative result must be combined with clinical observations, patient history, and epidemiological information. The expected result is Negative.  Fact Sheet for Patients:  BloggerCourse.com  Fact Sheet for Healthcare Providers:   SeriousBroker.it  This test is no t yet approved or cleared by the [place] FDA and  has been authorized for detection and/or diagnosis of SARS-CoV-2 by FDA under an Emergency Use Authorization (EUA). This EUA will remain  in effect (meaning this test can be used) for the duration of the COVID-19 declaration under Section 564(b)(1) of the Act, 21 U.S.C.section 360bbb-3(b)(1), unless the authorization is terminated  or revoked sooner.       Influenza A by PCR NEGATIVE NEGATIVE Final   Influenza B by PCR NEGATIVE NEGATIVE Final    Comment: (NOTE) The Xpert Xpress SARS-CoV-2/FLU/RSV plus assay is intended as an aid in the diagnosis of influenza from Nasopharyngeal swab specimens and should not be used as a sole basis for treatment. Nasal washings and aspirates are unacceptable for Xpert Xpress SARS-CoV-2/FLU/RSV testing.  Fact Sheet for Patients: BloggerCourse.com  Fact Sheet for Healthcare Providers: SeriousBroker.it  This test is not yet approved or cleared by the [place] FDA and has been authorized for detection and/or diagnosis of SARS-CoV-2 by FDA under an Emergency  Use Authorization (EUA). This EUA will remain in effect (meaning this test can be used) for the duration of the COVID-19 declaration under Section 564(b)(1) of the Act, 21 U.S.C. section 360bbb-3(b)(1), unless the authorization is terminated or revoked.     Resp Syncytial Virus by PCR NEGATIVE NEGATIVE Final    Comment: (NOTE) Fact Sheet for Patients: BloggerCourse.com  Fact Sheet for Healthcare Providers: SeriousBroker.it  This test is not yet approved or cleared by the [place] FDA and has been authorized for detection and/or diagnosis of SARS-CoV-2 by FDA under an Emergency Use Authorization (EUA). This EUA will remain in effect (meaning this test can be used) for  the duration of the COVID-19 declaration under Section 564(b)(1) of the Act, 21 U.S.C. section 360bbb-3(b)(1), unless the authorization is terminated or revoked.  Performed at [facility], [address], [place], [place] [address]   Culture, blood (x 2)     Status: None (Preliminary result)   Collection Time: 11/22/23  2:01 AM   Specimen: BLOOD RIGHT HAND  Result Value Ref Range Status   Specimen Description   Final    BLOOD RIGHT HAND Performed at [facility], [address] [place], [place] [address]    Special Requests   Final    BOTTLES DRAWN AEROBIC ONLY Blood Culture results may not be optimal due to an inadequate volume of blood received in culture bottles Performed at [facility], [address], [place], [place] [address]    Culture   Final    NO GROWTH < 24 HOURS Performed at [facility], [address], [place], [place] [address]    Report Status PENDING  Incomplete  Culture, blood (x 2)     Status: None (Preliminary result)   Collection Time: 11/22/23  2:01 AM   Specimen: BLOOD RIGHT HAND  Result Value Ref Range Status   Specimen Description   Final    BLOOD RIGHT HAND Performed at [facility], [address] [place], [place] [address]    Special Requests   Final    BOTTLES DRAWN AEROBIC ONLY Blood Culture results may not be optimal due to an inadequate volume of blood received in culture bottles Performed at [facility], [address], [place], [place] [address]    Culture   Final    NO GROWTH < 24 HOURS Performed at [facility], [address], [place], [place] [address]    Report Status PENDING  Incomplete     Studies: DG Chest Port 1 View Result Date: 11/22/2023 CLINICAL DATA:  History of sickle cell anemia with fever EXAM: PORTABLE CHEST 1 VIEW COMPARISON:  Chest radiograph dated 10/14/2022 FINDINGS: Low lung volumes with bronchovascular crowding. No focal  consolidations. No pleural effusion or pneumothorax. The heart size and mediastinal contours are within normal limits. 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 IMPRESSION: Low lung volumes with bronchovascular crowding. No focal consolidations. Electronically Signed   By: Agustin Cree M.D.   On: 11/22/2023 10:21   CT Angio Abd/Pel W and/or Wo Contrast Result Date: 11/18/2023 CLINICAL DATA:  Severe flank pain, history of sickle cell disease. EXAM: CTA ABDOMEN AND PELVIS WITHOUT AND WITH CONTRAST TECHNIQUE: Multidetector CT imaging of the abdomen and pelvis was performed using the standard protocol during bolus administration of intravenous contrast. Multiplanar reconstructed images and MIPs were obtained and reviewed to evaluate the vascular anatomy. RADIATION DOSE REDUCTION: This exam was performed  according to the departmental dose-optimization program which includes automated exposure control, adjustment of the mA and/or kV according to patient size and/or use of iterative reconstruction technique. CONTRAST:  75mL OMNIPAQUE IOHEXOL 350 MG/ML SOLN COMPARISON:  Prior CT scan of the chest 09/26/2016 FINDINGS: VASCULAR Aorta: Normal caliber aorta without aneurysm, dissection, vasculitis or significant stenosis. Celiac: Patent without evidence of aneurysm, dissection, vasculitis or significant stenosis. SMA: Patent without evidence of aneurysm, dissection, vasculitis or significant stenosis. Renals: Both renal arteries are patent without evidence of aneurysm, dissection, vasculitis, fibromuscular dysplasia or significant stenosis. IMA: Patent without evidence of aneurysm, dissection, vasculitis or significant stenosis. Inflow: Patent without evidence of aneurysm, dissection, vasculitis or significant stenosis. Proximal Outflow: Bilateral common femoral and visualized portions of the superficial and profunda femoral arteries are patent without evidence of aneurysm, dissection, vasculitis or  significant stenosis. Veins: No focal venous abnormality. Review of the MIP images confirms the above findings. NON-VASCULAR Lower chest: No acute abnormality. Chronic cardiomegaly likely related to high cardiac output in the setting of chronic anemia. Hepatobiliary: Normal hepatic contour and morphology. No discrete hepatic lesion. Distended gallbladder with mild hyperenhancement of the mucosa. Small stones visualized. No biliary ductal dilatation. Pancreas: Unremarkable. No pancreatic ductal dilatation or surrounding inflammatory changes. 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 Adrenals/Urinary Tract: Normal adrenal glands. No hydronephrosis, nephrolithiasis or enhancing renal mass. No evidence of renal infarct. The ureters are unremarkable. Limited evaluation of the bladder due to extensive streak artifact from bilateral hip arthroplasty prostheses. Stomach/Bowel: Normal appendix in the right lower quadrant. No focal bowel wall thickening or evidence of obstruction. Lymphatic: No suspicious lymphadenopathy. Reproductive: Limited evaluation due to streak artifact from bilateral hip arthroplasty prostheses. Other: No evidence of ascites.  Fat containing umbilical hernia. Musculoskeletal: Expected H-shaped vertebral body changes throughout the visualized spine. Surgical changes of bilateral hip joint arthroplasty. IMPRESSION: 1. No evidence of arterial or venous vascular abnormality. 2. Diffusely distended gallbladder with mild hyperenhancement of the mucosa and small stones. If the patient's flank pain is localized to the right, acute cholecystitis would be a consideration. Right upper quadrant ultrasound can further evaluate if clinically warranted. 3. No evidence of kidney stones or renal infarct. 4. Expected sequelae of sickle cell disease including auto infarction of the spleen, H-shaped vertebral body changes and bilateral hip arthroplasty. 5. Fat containing umbilical hernia.  Electronically Signed   By: Malachy Moan M.D.   On: 11/18/2023 16:03    Scheduled Meds:  amLODipine  10 mg Oral Daily   carvedilol  6.25 mg Oral BID WC   enoxaparin (LOVENOX) injection  40 mg Subcutaneous Q24H   HYDROmorphone   Intravenous Q4H   irbesartan  150 mg Oral Daily   pravastatin  20 mg Oral q1800   senna-docusate  1 tablet Oral BID   triamterene-hydrochlorothiazide  1 tablet Oral Daily   Continuous Infusions:  cefTRIAXone (ROCEPHIN)  IV 1 g (11/22/23 1742)    Principal Problem:   Sickle cell pain crisis (HCC) Active Problems:   Hypokalemia   AKI (acute kidney injury) (HCC)   Chronic pain   HTN (hypertension)

## 2023-11-24 DIAGNOSIS — D5700 Hb-SS disease with crisis, unspecified: Secondary | ICD-10-CM | POA: Diagnosis not present

## 2023-11-24 MED ORDER — OXYCODONE-ACETAMINOPHEN 5-325 MG PO TABS
2.0000 | ORAL_TABLET | Freq: Four times a day (QID) | ORAL | Status: DC
  Administered 2023-11-24 – 2023-11-26 (×8): 2 via ORAL
  Filled 2023-11-24 (×8): qty 2

## 2023-11-24 NOTE — Progress Notes (Signed)
 SICKLE CELL SERVICE PROGRESS NOTE  Nicolas [name] ZOX:[id number] DOB: Sep 15, 1976 DOA: 11/18/2023 PCP: Dorothyann Peng, MD  Assessment/Plan: Principal Problem:   Sickle cell pain crisis (HCC) Active Problems:   Hypokalemia   AKI (acute kidney injury) (HCC)   Chronic pain   HTN (hypertension)  Sickle cell pain crisis: Patient currently on Dilaudid PCA, No Toradol and IV fluids.  Patient's pain is still at 7 out of 10 today.  Will schedule his oral medications rather than as needed.  Continue current regimen Anemia of chronic disease: Hemoglobin is 9.6.  Continue to monitor H&H. Essential hypertension: Continue amlodipine and Coreg AKI: Continue to monitor. Hypokalemia: Potassium is 3.3 continue to replete potassium.  Will recheck in the morning. Chronic pain syndrome: continue home regimen. Leukocytosis: White count now is 12.2.  Slowly improving.  Continue to monitor. Insulin-dependent diabetes: Sliding scale insulin.  Continue to monitor.  Code Status: Full code Family Communication: No family at bedside Disposition Plan: Home  [facility]  Pager [phone]. If 7PM-7AM, please contact night-coverage.  11/24/2023, 4:57 PM  LOS: 6 days   Brief narrative: Nicolas [name] is a 48 y.o. male with medical history significant for sickle cell anemia, hypertension, and osteoarthritis who presents with severe pain in his lower back and bilateral hips.   Patient began experiencing severe pain in the bilateral lower back and hips on 11/16/2023.  He denies any preceding trauma or inciting event but notes that he frequently experiences pain at the sites and attributes it to osteoarthritis and sickle cell disease.  He denies abdominal pain, nausea, vomiting, fever, or chills.    Consultants: None  Procedures: None  Antibiotics: None  HPI/Subjective: Patient's pain is at 7 out of 10.  On Dilaudid PCA.  Pain has slowly improved.  Also potassium 3.3 yesterday but  repleted.  Objective: Vitals:   11/24/23 1104 11/24/23 1325 11/24/23 1349 11/24/23 1515  BP:  (!) 143/87    Pulse:  90    Resp: 20 16 13 17   Temp:  98.5 F (36.9 C)    TempSrc:  Oral    SpO2: 97% 96% 99% 98%  Weight:      Height:       Weight change:   Intake/Output Summary (Last 24 hours) at 11/24/2023 1657 Last data filed at 11/24/2023 1506 Gross per 24 hour  Intake 480 ml  Output 1150 ml  Net -670 ml    General: Alert, awake, oriented x3, in no acute distress.  HEENT: [place]/AT PEERL, EOMI Neck: Trachea midline,  no masses, no thyromegal,y no JVD, no carotid bruit OROPHARYNX:  Moist, No exudate/ erythema/lesions.  Heart: Regular rate and rhythm, without murmurs, rubs, gallops, PMI non-displaced, no heaves or thrills on palpation.  Lungs: Clear to auscultation, no wheezing or rhonchi noted. No increased vocal fremitus resonant to percussion  Abdomen: Soft, nontender, nondistended, positive bowel sounds, no masses no hepatosplenomegaly noted..  Neuro: No focal neurological deficits noted cranial nerves II through XII grossly intact. DTRs 2+ bilaterally upper and lower extremities. Strength 5 out of 5 in bilateral upper and lower extremities. Musculoskeletal: No warm swelling or erythema around joints, no spinal tenderness noted. Psychiatric: Patient alert and oriented x3, good insight and cognition, good recent to remote recall. Lymph node survey: No cervical axillary or inguinal lymphadenopathy noted.   Data Reviewed: Basic Metabolic Panel: Recent Labs  Lab 11/18/23 1500 11/18/23 1511 11/19/23 0342 11/20/23 1031 11/22/23 0201 11/23/23 0920  NA 147* 146* 145 140 141 141  K 2.9* 2.8*  3.2* 3.3* 3.0* 3.3*  CL 105 106 105 103 101 101  CO2 27  --  26 28 30 29   GLUCOSE 117* 117* 97 156* 165* 171*  BUN 10 10 11 12 17  22*  CREATININE 1.75* 1.90* 1.36* 1.39* 1.74* 1.41*  CALCIUM 9.3  --  8.9 8.6* 9.0 9.0  MG  --   --  1.7  --   --   --    Liver Function Tests: Recent Labs   Lab 11/18/23 1500 11/20/23 1031 11/23/23 0920  AST 40 40 72*  ALT 30 24 32  ALKPHOS 39 42 45  BILITOT 1.2 1.0 1.1  PROT 7.2 7.2 7.3  ALBUMIN 4.2 3.5 3.1*   No results for input(s): "LIPASE", "AMYLASE" in the last 168 hours. No results for input(s): "AMMONIA" in the last 168 hours. CBC: Recent Labs  Lab 11/18/23 1500 11/18/23 1511 11/20/23 1031 11/22/23 0201 11/23/23 0920  WBC 13.0*  --  12.2* 13.2* 12.3*  NEUTROABS 9.7*  --  9.5*  --  10.1*  HGB 11.7* 12.6* 9.6* 8.6* 8.4*  HCT 32.4* 37.0* 27.8* 24.9* 24.0*  MCV 83.3  --  85.0 84.7 83.9  PLT 329  --  276 273 286   Cardiac Enzymes: No results for input(s): "CKTOTAL", "CKMB", "CKMBINDEX", "TROPONINI" in the last 168 hours. BNP (last 3 results) No results for input(s): "BNP" in the last 8760 hours.  ProBNP (last 3 results) No results for input(s): "PROBNP" in the last 8760 hours.  CBG: Recent Labs  Lab 11/19/23 1653  GLUCAP 135*    Recent Results (from the past 240 hours)  Resp panel by RT-PCR (RSV, Flu A&B, Covid) Anterior Nasal Swab     Status: None   Collection Time: 11/22/23  1:00 AM   Specimen: Anterior Nasal Swab  Result Value Ref Range Status   SARS Coronavirus 2 by RT PCR NEGATIVE NEGATIVE Final    Comment: (NOTE) SARS-CoV-2 target nucleic acids are NOT DETECTED.  The SARS-CoV-2 RNA is generally detectable in upper respiratory specimens during the acute phase of infection. The lowest concentration of SARS-CoV-2 viral copies this assay can detect is 138 copies/mL. A negative result does not preclude SARS-Cov-2 infection and should not be used as the sole basis for treatment or other patient management decisions. A negative result may occur with  improper specimen collection/handling, submission of specimen other than nasopharyngeal swab, presence of viral mutation(s) within the areas targeted by this assay, and inadequate number of viral copies(<138 copies/mL). A negative result must be combined  with clinical observations, patient history, and epidemiological information. The expected result is Negative.  Fact Sheet for Patients:  BloggerCourse.com  Fact Sheet for Healthcare Providers:  SeriousBroker.it  This test is no t yet approved or cleared by the [place] FDA and  has been authorized for detection and/or diagnosis of SARS-CoV-2 by FDA under an Emergency Use Authorization (EUA). This EUA will remain  in effect (meaning this test can be used) for the duration of the COVID-19 declaration under Section 564(b)(1) of the Act, 21 U.S.C.section 360bbb-3(b)(1), unless the authorization is terminated  or revoked sooner.       Influenza A by PCR NEGATIVE NEGATIVE Final   Influenza B by PCR NEGATIVE NEGATIVE Final    Comment: (NOTE) The Xpert Xpress SARS-CoV-2/FLU/RSV plus assay is intended as an aid in the diagnosis of influenza from Nasopharyngeal swab specimens and should not be used as a sole basis for treatment. Nasal washings and aspirates are unacceptable for  Xpert Xpress SARS-CoV-2/FLU/RSV testing.  Fact Sheet for Patients: BloggerCourse.com  Fact Sheet for Healthcare Providers: SeriousBroker.it  This test is not yet approved or cleared by the [place] FDA and has been authorized for detection and/or diagnosis of SARS-CoV-2 by FDA under an Emergency Use Authorization (EUA). This EUA will remain in effect (meaning this test can be used) for the duration of the COVID-19 declaration under Section 564(b)(1) of the Act, 21 U.S.C. section 360bbb-3(b)(1), unless the authorization is terminated or revoked.     Resp Syncytial Virus by PCR NEGATIVE NEGATIVE Final    Comment: (NOTE) Fact Sheet for Patients: BloggerCourse.com  Fact Sheet for Healthcare Providers: SeriousBroker.it  This test is not yet approved  or cleared by the [place] FDA and has been authorized for detection and/or diagnosis of SARS-CoV-2 by FDA under an Emergency Use Authorization (EUA). This EUA will remain in effect (meaning this test can be used) for the duration of the COVID-19 declaration under Section 564(b)(1) of the Act, 21 U.S.C. section 360bbb-3(b)(1), unless the authorization is terminated or revoked.  Performed at [facility], [address] [place], [place] [address]   Culture, blood (x 2)     Status: None (Preliminary result)   Collection Time: 11/22/23  2:01 AM   Specimen: BLOOD RIGHT HAND  Result Value Ref Range Status   Specimen Description   Final    BLOOD RIGHT HAND Performed at [facility], [address], [place], [place] [address]    Special Requests   Final    BOTTLES DRAWN AEROBIC ONLY Blood Culture results may not be optimal due to an inadequate volume of blood received in culture bottles Performed at [facility], [address] [place], [place] [address]    Culture   Final    NO GROWTH 2 DAYS Performed at [facility], [address] [place], [place] [address]    Report Status PENDING  Incomplete  Culture, blood (x 2)     Status: None (Preliminary result)   Collection Time: 11/22/23  2:01 AM   Specimen: BLOOD RIGHT HAND  Result Value Ref Range Status   Specimen Description   Final    BLOOD RIGHT HAND Performed at [facility], [address] [place], [place] [address]    Special Requests   Final    BOTTLES DRAWN AEROBIC ONLY Blood Culture results may not be optimal due to an inadequate volume of blood received in culture bottles Performed at [facility], [address], [place], [place] [address]    Culture   Final    NO GROWTH 2 DAYS Performed at [facility], [address] [place], [place] [address]    Report Status PENDING  Incomplete     Studies: DG Chest Port 1 View Result Date:  11/22/2023 CLINICAL DATA:  History of sickle cell anemia with fever EXAM: PORTABLE CHEST 1 VIEW COMPARISON:  Chest radiograph dated 10/14/2022 FINDINGS: Low lung volumes with bronchovascular crowding. No focal consolidations. No pleural effusion or pneumothorax. The heart size and mediastinal contours are within normal limits. 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 IMPRESSION: Low lung volumes with bronchovascular crowding. No focal consolidations. Electronically Signed   By: Agustin Cree M.D.   On: 11/22/2023 10:21   CT Angio Abd/Pel W and/or Wo Contrast Result Date: 11/18/2023 CLINICAL DATA:  Severe flank pain, history of sickle cell disease. EXAM: CTA ABDOMEN AND PELVIS WITHOUT AND  WITH CONTRAST TECHNIQUE: Multidetector CT imaging of the abdomen and pelvis was performed using the standard protocol during bolus administration of intravenous contrast. Multiplanar reconstructed images and MIPs were obtained and reviewed to evaluate the vascular anatomy. RADIATION DOSE REDUCTION: This exam was performed according to the departmental dose-optimization program which includes automated exposure control, adjustment of the mA and/or kV according to patient size and/or use of iterative reconstruction technique. CONTRAST:  75mL OMNIPAQUE IOHEXOL 350 MG/ML SOLN COMPARISON:  Prior CT scan of the chest 09/26/2016 FINDINGS: VASCULAR Aorta: Normal caliber aorta without aneurysm, dissection, vasculitis or significant stenosis. Celiac: Patent without evidence of aneurysm, dissection, vasculitis or significant stenosis. SMA: Patent without evidence of aneurysm, dissection, vasculitis or significant stenosis. Renals: Both renal arteries are patent without evidence of aneurysm, dissection, vasculitis, fibromuscular dysplasia or significant stenosis. IMA: Patent without evidence of aneurysm, dissection, vasculitis or significant stenosis. Inflow: Patent without evidence of aneurysm, dissection, vasculitis or  significant stenosis. Proximal Outflow: Bilateral common femoral and visualized portions of the superficial and profunda femoral arteries are patent without evidence of aneurysm, dissection, vasculitis or significant stenosis. Veins: No focal venous abnormality. Review of the MIP images confirms the above findings. NON-VASCULAR Lower chest: No acute abnormality. Chronic cardiomegaly likely related to high cardiac output in the setting of chronic anemia. Hepatobiliary: Normal hepatic contour and morphology. No discrete hepatic lesion. Distended gallbladder with mild hyperenhancement of the mucosa. Small stones visualized. No biliary ductal dilatation. Pancreas: Unremarkable. No pancreatic ductal dilatation or surrounding inflammatory changes. 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 Adrenals/Urinary Tract: Normal adrenal glands. No hydronephrosis, nephrolithiasis or enhancing renal mass. No evidence of renal infarct. The ureters are unremarkable. Limited evaluation of the bladder due to extensive streak artifact from bilateral hip arthroplasty prostheses. Stomach/Bowel: Normal appendix in the right lower quadrant. No focal bowel wall thickening or evidence of obstruction. Lymphatic: No suspicious lymphadenopathy. Reproductive: Limited evaluation due to streak artifact from bilateral hip arthroplasty prostheses. Other: No evidence of ascites.  Fat containing umbilical hernia. Musculoskeletal: Expected H-shaped vertebral body changes throughout the visualized spine. Surgical changes of bilateral hip joint arthroplasty. IMPRESSION: 1. No evidence of arterial or venous vascular abnormality. 2. Diffusely distended gallbladder with mild hyperenhancement of the mucosa and small stones. If the patient's flank pain is localized to the right, acute cholecystitis would be a consideration. Right upper quadrant ultrasound can further evaluate if clinically warranted. 3. No evidence of kidney stones  or renal infarct. 4. Expected sequelae of sickle cell disease including auto infarction of the spleen, H-shaped vertebral body changes and bilateral hip arthroplasty. 5. Fat containing umbilical hernia. Electronically Signed   By: Malachy Moan M.D.   On: 11/18/2023 16:03    Scheduled Meds:  amLODipine  10 mg Oral Daily   carvedilol  6.25 mg Oral BID WC   enoxaparin (LOVENOX) injection  40 mg Subcutaneous Q24H   HYDROmorphone   Intravenous Q4H   irbesartan  150 mg Oral Daily   pravastatin  20 mg Oral q1800   senna-docusate  1 tablet Oral BID   triamterene-hydrochlorothiazide  1 tablet Oral Daily   Continuous Infusions:  cefTRIAXone (ROCEPHIN)  IV 1 g (11/24/23 1519)    Principal Problem:   Sickle cell pain crisis (HCC) Active Problems:   Hypokalemia   AKI (acute kidney injury) (HCC)   Chronic pain   HTN (hypertension)

## 2023-11-24 NOTE — Evaluation (Signed)
 Physical Therapy Evaluation Patient Details Name: Nicolas [name] MRN: [id number] DOB: 1976/07/11 Today's Date: 11/24/2023  History of Present Illness  Nicolas [name] is a 48 y.o. male with medical history significant for sickle cell anemia, hypertension, and osteoarthritis who presents with severe pain in his lower back and bilateral hips admitted 11/18/23 with sickle cell crisis.  Clinical Impression  Pt admitted with above diagnosis. Pt requires HHA and increased time to come to sit EOB min A. He is able to maintain balance, moving hands from walker to bed fro beginning STS. Mod A STS with 2WW and advances to the chair with increased time and cues for turning and positioning, hand placement. Pt prefers to stay in the chair for a bit, has callbell and PCA in reach. Pt currently with functional limitations due to the deficits listed below (see PT Problem List). Pt will benefit from acute skilled PT to increase their independence and safety with mobility to allow discharge.           If plan is discharge home, recommend the following: A lot of help with walking and/or transfers;A lot of help with bathing/dressing/bathroom;Assist for transportation;Two people to help with bathing/dressing/bathroom;Two people to help with walking and/or transfers   Can travel by private vehicle        Equipment Recommendations None recommended by PT  Recommendations for Other Services  OT consult    Functional Status Assessment       Precautions / Restrictions Precautions Precautions: Fall Recall of Precautions/Restrictions: Intact Restrictions Weight Bearing Restrictions Per Provider Order: No Other Position/Activity Restrictions: inc joint pain associated with crisis      Mobility  Bed Mobility Overal bed mobility: Needs Assistance Bed Mobility: Supine to Sit     Supine to sit: Min assist     General bed mobility comments: HHA, inc time    Transfers Overall transfer level: Needs  assistance Equipment used: Rolling walker (2 wheels) Transfers: Sit to/from Stand, Bed to chair/wheelchair/BSC Sit to Stand: Mod assist   Step pivot transfers: Contact guard assist       General transfer comment: inc time, cues for positioning    Ambulation/Gait                  Stairs            Wheelchair Mobility     Tilt Bed    Modified Rankin (Stroke Patients Only)       Balance Overall balance assessment: Needs assistance Sitting-balance support: Feet supported Sitting balance-Leahy Scale: Fair Sitting balance - Comments: hands on either walker or bed sitting EOB, able to transition between these surfaces   Standing balance support: Bilateral upper extremity supported, During functional activity, Reliant on assistive device for balance Standing balance-Leahy Scale: Poor                               Pertinent Vitals/Pain Pain Assessment Pain Assessment: 0-10 Pain Score: 7     Home Living Family/patient expects to be discharged to:: Private residence Living Arrangements: Spouse/significant other;Children Available Help at Discharge: Family;Available PRN/intermittently Type of Home: House Home Access: Stairs to enter Entrance Stairs-Rails: None Entrance Stairs-Number of Steps: 2   Home Layout: One level Home Equipment: Cane - single Librarian, academic (2 wheels)      Prior Function Prior Level of Function : Independent/Modified Independent             Mobility Comments: moves slow  due to sickle cell crisis and inc pain in R shoulder, bilateral lumbar spine ADLs Comments: dtr helps drive sometimes     Extremity/Trunk Assessment        Lower Extremity Assessment Lower Extremity Assessment: Generalized weakness       Communication   Communication Communication: No apparent difficulties    Cognition Arousal: Alert Behavior During Therapy: WFL for tasks assessed/performed                              Following commands: Intact       Cueing Cueing Techniques: Verbal cues     General Comments General comments (skin integrity, edema, etc.): Pt has decreased activity tolerance at this time and is able to transfer to chair with increased time, he expressed appreciation for allowing him to determine rate of activity    Exercises     Assessment/Plan    PT Assessment Patient needs continued PT services  PT Problem List         PT Treatment Interventions DME instruction;Balance training;Functional mobility training;Patient/family education;Gait training;Therapeutic activities;Stair training;Therapeutic exercise;Manual techniques    PT Goals (Current goals can be found in the Care Plan section)  Acute Rehab PT Goals Patient Stated Goal: return home PT Goal Formulation: With patient Time For Goal Achievement: 12/08/23 Potential to Achieve Goals: Good    Frequency Min 1X/week     Co-evaluation               AM-PAC PT "6 Clicks" Mobility  Outcome Measure Help needed turning from your back to your side while in a flat bed without using bedrails?: A Lot Help needed moving from lying on your back to sitting on the side of a flat bed without using bedrails?: A Lot Help needed moving to and from a bed to a chair (including a wheelchair)?: A Little Help needed standing up from a chair using your arms (e.g., wheelchair or bedside chair)?: A Lot Help needed to walk in hospital room?: A Little Help needed climbing 3-5 steps with a railing? : Total 6 Click Score: 13    End of Session Equipment Utilized During Treatment: Gait belt;Oxygen (2L/m via [place]) Activity Tolerance: Patient limited by pain;Patient limited by fatigue Patient left: in chair;with call bell/phone within reach Nurse Communication: Mobility status PT Visit Diagnosis: Unsteadiness on feet (R26.81);Muscle weakness (generalized) (M62.81);Pain;Difficulty in walking, not elsewhere classified (R26.2)    Time:  1334-1400 PT Time Calculation (min) (ACUTE ONLY): 26 min   Charges:   PT Evaluation $PT Eval Low Complexity: 1 Low PT Treatments $Therapeutic Activity: 8-22 mins PT General Charges $$ ACUTE PT VISIT: 1 Visit         Nicolas [name], PT Acute Rehabilitation Services Office: [phone] 11/24/2023   [name] 11/24/2023, 3:03 PM

## 2023-11-25 ENCOUNTER — Ambulatory Visit: Payer: 59 | Admitting: Internal Medicine

## 2023-11-25 LAB — COMPREHENSIVE METABOLIC PANEL
ALT: 57 U/L — ABNORMAL HIGH (ref 0–44)
AST: 91 U/L — ABNORMAL HIGH (ref 15–41)
Albumin: 2.7 g/dL — ABNORMAL LOW (ref 3.5–5.0)
Alkaline Phosphatase: 115 U/L (ref 38–126)
Anion gap: 11 (ref 5–15)
BUN: 23 mg/dL — ABNORMAL HIGH (ref 6–20)
CO2: 31 mmol/L (ref 22–32)
Calcium: 8.8 mg/dL — ABNORMAL LOW (ref 8.9–10.3)
Chloride: 98 mmol/L (ref 98–111)
Creatinine, Ser: 1.38 mg/dL — ABNORMAL HIGH (ref 0.61–1.24)
GFR, Estimated: >60 mL/min (ref >60)
Glucose, Bld: 149 mg/dL — ABNORMAL HIGH (ref 70–99)
Potassium: 3.4 mmol/L — ABNORMAL LOW (ref 3.5–5.1)
Sodium: 140 mmol/L (ref 135–145)
Total Bilirubin: 0.8 mg/dL (ref 0.0–1.2)
Total Protein: 7.3 g/dL (ref 6.5–8.1)

## 2023-11-25 LAB — CBC WITH DIFFERENTIAL/PLATELET
Abs Immature Granulocytes: 0.04 10*3/uL (ref 0.00–0.07)
Basophils Absolute: 0 10*3/uL (ref 0.0–0.1)
Basophils Relative: 1 %
Eosinophils Absolute: 0.3 10*3/uL (ref 0.0–0.5)
Eosinophils Relative: 3 %
HCT: 23.9 % — ABNORMAL LOW (ref 39.0–52.0)
Hemoglobin: 8.4 g/dL — ABNORMAL LOW (ref 13.0–17.0)
Immature Granulocytes: 1 %
Lymphocytes Relative: 19 %
Lymphs Abs: 1.5 10*3/uL (ref 0.7–4.0)
MCH: 28.7 pg (ref 26.0–34.0)
MCHC: 35.1 g/dL (ref 30.0–36.0)
MCV: 81.6 fL (ref 80.0–100.0)
Monocytes Absolute: 0.8 10*3/uL (ref 0.1–1.0)
Monocytes Relative: 11 %
Neutro Abs: 5.3 10*3/uL (ref 1.7–7.7)
Neutrophils Relative %: 65 %
Platelets: 389 10*3/uL (ref 150–400)
RBC: 2.93 MIL/uL — ABNORMAL LOW (ref 4.22–5.81)
RDW: 18.6 % — ABNORMAL HIGH (ref 11.5–15.5)
WBC: 8 10*3/uL (ref 4.0–10.5)
nRBC: 0.4 % — ABNORMAL HIGH (ref 0.0–0.2)

## 2023-11-25 NOTE — Progress Notes (Signed)
 Subjective: Patient continues to report improved generalized pain of 6 out of 10.  Lower back, bilateral lower extremity and hips.  Objective:  Patient is not in distress alert and oriented.  Assessment completed pain is gradually improving he reports a pain of 6 out of 10 today.  He denies  fever, chills, nausea or vomiting.  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  HEENT: [place]/AT PEERL, EOMI Neck: Trachea midline,  no masses, no thyromegal,y no JVD, no carotid bruit OROPHARYNX:  Moist, No exudate/ erythema/lesions.  Heart: Regular rate and rhythm, without murmurs, rubs, gallops, PMI non-displaced, no heaves or thrills on palpation.  Lungs: Clear to auscultation, no wheezing or rhonchi noted. No increased vocal fremitus resonant to percussion  Abdomen: Soft, nontender, nondistended, positive bowel sounds, no masses no hepatosplenomegaly noted..  Neuro: No focal neurological deficits noted cranial nerves II through XII grossly intact. DTRs 2+ bilaterally upper and lower extremities. Strength 5 out of 5 in bilateral upper and lower extremities. Musculoskeletal: No warm swelling or erythema around joints, no spinal tenderness noted. Psychiatric: Patient alert and oriented x3, good insight and cognition, good recent to remote recall. Lymph node survey: No cervical axillary or inguinal lymphadenopathy noted.  Lab Results:  Basic Metabolic Panel:    Component Value Date/Time   NA 140 11/25/2023 0559   NA 144 06/23/2023 1534   K  3.4 (L) 11/25/2023 0559   CL 98 11/25/2023 0559   CO2 31 11/25/2023 0559   BUN 23 (H) 11/25/2023 0559   BUN 22 06/23/2023 1534   CREATININE 1.38 (H) 11/25/2023 0559   GLUCOSE 149 (H) 11/25/2023 0559   CALCIUM 8.8 (L) 11/25/2023 0559   CBC:    Component Value Date/Time   WBC 8.0 11/25/2023 0559   HGB 8.4 (L) 11/25/2023 0559   HGB 12.2 (L) 06/04/2022 1458   HCT 23.9 (L) 11/25/2023 0559   HCT 36.7 (L) 06/04/2022 1458   PLT 389 11/25/2023 0559   PLT 448 06/04/2022 1458   MCV 81.6 11/25/2023 0559   MCV 89 06/04/2022 1458   NEUTROABS 5.3 11/25/2023 0559   NEUTROABS 5.7 06/04/2022 1458   LYMPHSABS 1.5 11/25/2023 0559   LYMPHSABS 2.6 06/04/2022 1458   MONOABS 0.8 11/25/2023 0559   EOSABS 0.3 11/25/2023 0559   EOSABS 0.2 06/04/2022 1458   BASOSABS 0.0 11/25/2023 0559   BASOSABS 0.1 06/04/2022 1458    Recent Results (from the past 240 hours)  Resp panel by RT-PCR (RSV, Flu A&B, Covid) Anterior Nasal Swab     Status: None   Collection Time: 11/22/23  1:00 AM   Specimen: Anterior Nasal Swab  Result Value Ref Range Status   SARS Coronavirus 2 by RT PCR NEGATIVE NEGATIVE Final    Comment: (NOTE) SARS-CoV-2 target nucleic acids are NOT DETECTED.  The SARS-CoV-2 RNA is generally detectable in upper respiratory specimens during the  acute phase of infection. The lowest concentration of SARS-CoV-2 viral copies this assay can detect is 138 copies/mL. A negative result does not preclude SARS-Cov-2 infection and should not be used as the sole basis for treatment or other patient management decisions. A negative result may occur with  improper specimen collection/handling, submission of specimen other than nasopharyngeal swab, presence of viral mutation(s) within the areas targeted by this assay, and inadequate number of viral copies(<138 copies/mL). A negative result must be combined with clinical observations, patient history, and epidemiological information. The expected result is  Negative.  Fact Sheet for Patients:  BloggerCourse.com  Fact Sheet for Healthcare Providers:  SeriousBroker.it  This test is no t yet approved or cleared by the [place] FDA and  has been authorized for detection and/or diagnosis of SARS-CoV-2 by FDA under an Emergency Use Authorization (EUA). This EUA will remain  in effect (meaning this test can be used) for the duration of the COVID-19 declaration under Section 564(b)(1) of the Act, 21 U.S.C.section 360bbb-3(b)(1), unless the authorization is terminated  or revoked sooner.       Influenza A by PCR NEGATIVE NEGATIVE Final   Influenza B by PCR NEGATIVE NEGATIVE Final    Comment: (NOTE) The Xpert Xpress SARS-CoV-2/FLU/RSV plus assay is intended as an aid in the diagnosis of influenza from Nasopharyngeal swab specimens and should not be used as a sole basis for treatment. Nasal washings and aspirates are unacceptable for Xpert Xpress SARS-CoV-2/FLU/RSV testing.  Fact Sheet for Patients: BloggerCourse.com  Fact Sheet for Healthcare Providers: SeriousBroker.it  This test is not yet approved or cleared by the [place] FDA and has been authorized for detection and/or diagnosis of SARS-CoV-2 by FDA under an Emergency Use Authorization (EUA). This EUA will remain in effect (meaning this test can be used) for the duration of the COVID-19 declaration under Section 564(b)(1) of the Act, 21 U.S.C. section 360bbb-3(b)(1), unless the authorization is terminated or revoked.     Resp Syncytial Virus by PCR NEGATIVE NEGATIVE Final    Comment: (NOTE) Fact Sheet for Patients: BloggerCourse.com  Fact Sheet for Healthcare Providers: SeriousBroker.it  This test is not yet approved or cleared by the [place] FDA and has been authorized for detection and/or diagnosis of  SARS-CoV-2 by FDA under an Emergency Use Authorization (EUA). This EUA will remain in effect (meaning this test can be used) for the duration of the COVID-19 declaration under Section 564(b)(1) of the Act, 21 U.S.C. section 360bbb-3(b)(1), unless the authorization is terminated or revoked.  Performed at [facility], [address] [place], [place] [address]   Culture, blood (x 2)     Status: None   Collection Time: 11/22/23  2:01 AM   Specimen: BLOOD RIGHT HAND  Result Value Ref Range Status   Specimen Description   Final    BLOOD RIGHT HAND Performed at [facility], [address], [place], [place] [address]    Special Requests   Final    BOTTLES DRAWN AEROBIC ONLY Blood Culture results may not be optimal due to an inadequate volume of blood received in culture bottles Performed at [facility], [address], [place], [place] [address]    Culture   Final    NO GROWTH 5 DAYS Performed at [facility], [address] [place], [place] [address]    Report Status 11/27/2023 FINAL  Final  Culture, blood (x 2)     Status: None   Collection  Time: 11/22/23  2:01 AM   Specimen: BLOOD RIGHT HAND  Result Value Ref Range Status   Specimen Description   Final    BLOOD RIGHT HAND Performed at [facility], [address], [place], [place] [address]    Special Requests   Final    BOTTLES DRAWN AEROBIC ONLY Blood Culture results may not be optimal due to an inadequate volume of blood received in culture bottles Performed at [facility], [address], [place], [place] [address]    Culture   Final    NO GROWTH 5 DAYS Performed at [facility], [address], [place], [place] [address]    Report Status 11/27/2023 FINAL  Final    Studies/Results: No results found.  Medications: Scheduled Meds: Continuous Infusions: PRN  Meds:.  Consultants: None  Procedures: None  Antibiotics: None  Assessment/Plan: Principal Problem:   Sickle cell pain crisis (HCC) Active Problems:   Hypokalemia   AKI (acute kidney injury) (HCC)   Chronic pain   HTN (hypertension)   Hb Sickle Cell Disease with Pain crisis: Continue IVF the 0.45% Saline @ 125 mls/hour, continue weight based Dilaudid PCA, IV Toradol 15 mg Q 6 H, Monitor vitals very closely, Re-evaluate pain scale regularly, 2 L of Oxygen by Fults. Leukocytosis:  Anemia of Chronic Disease:  Chronic pain Syndrome:  Medication non-compliance  Code Status: Full Code Family Communication: N/A Disposition Plan: Not yet ready for discharge  Daryll Drown NP  If 7PM-7AM, please contact night-coverage.  11/27/2023, 3:42 PM  LOS: 8 days

## 2023-11-25 NOTE — Plan of Care (Signed)

## 2023-11-26 NOTE — Plan of Care (Signed)

## 2023-11-26 NOTE — Progress Notes (Signed)
 Physical Therapy Treatment Patient Details Name: Nicolas [name] MRN: [id number] DOB: 1976-03-23 Today's Date: 11/26/2023   History of Present Illness Nicolas [name] is a 48 y.o. male with medical history significant for sickle cell anemia, hypertension, and osteoarthritis who presents with severe pain in his lower back and bilateral hips admitted 11/18/23 with sickle cell crisis.    PT Comments  Pt preapring for dc, requests assistance with EOB tasks such as dressing and then amb to reclining chair until ride arrives. Initially, pt inquires about continuum of care for post acute dc and f/u/ Pt encouraged to follow MD recs and f/u with PCP regarding sickle cell crisis, is he feels he is not returning to PLOF as anticipated, OPPT could prove beneficial and he could work with PCP regarding referral. Pt is able to complete upper and lower body dressing and balance sitting EOB, comes to stand and amb to reclining chair with no hands on assist, Vcs only. Pt denies further questions, comments, or concerns at this time.    If plan is discharge home, recommend the following: Assist for transportation;A little help with walking and/or transfers;A little help with bathing/dressing/bathroom;Assistance with cooking/housework   Can travel by private vehicle        Equipment Recommendations  None recommended by PT    Recommendations for Other Services       Precautions / Restrictions Precautions Precautions: Fall Recall of Precautions/Restrictions: Intact Restrictions Weight Bearing Restrictions Per Provider Order: No Other Position/Activity Restrictions: inc joint pain associated with crisis     Mobility  Bed Mobility Overal bed mobility: Modified Independent Bed Mobility: Supine to Sit     Supine to sit: Modified independent (Device/Increase time)     General bed mobility comments: inc time, HOB elevated    Transfers Overall transfer level: Modified independent Equipment used: Rolling  walker (2 wheels) Transfers: Sit to/from Stand Sit to Stand: Supervision (cues for hand placement)   Step pivot transfers: Supervision       General transfer comment: moves slow due to pain in joints    Ambulation/Gait Ambulation/Gait assistance: Supervision Gait Distance (Feet): 20 Feet Assistive device: Rolling walker (2 wheels) Gait Pattern/deviations: Step-to pattern, Trunk flexed (lifts walker between LE advancements) Gait velocity: dec     General Gait Details: dec spped, lifts walker to advance, good safe directional changes with wide BOS   Stairs             Wheelchair Mobility     Tilt Bed    Modified Rankin (Stroke Patients Only)       Balance Overall balance assessment: Needs assistance Sitting-balance support: Feet supported Sitting balance-Leahy Scale: Good Sitting balance - Comments: sitting EOB able to don lower and upper garments with inc time, minor cues for donning while sitting   Standing balance support: During functional activity Standing balance-Leahy Scale: Good Standing balance comment: attempts to place RLE through pantleg in standing. cues to sit and place both feet through then stand to pull up rest of the way                            Communication    Cognition Arousal: Alert Behavior During Therapy: [facility] for tasks assessed/performed                                    Cueing    Exercises  General Comments General comments (skin integrity, edema, etc.): inc time for tasks, cues for safe techniques, pt eager to return to PLOF      Pertinent Vitals/Pain Pain Assessment Pain Assessment: 0-10 Pain Score: 5  Pain Location: elbows (BUE) Pain Descriptors / Indicators: Aching, Constant    Home Living                          Prior Function            PT Goals (current goals can now be found in the care plan section) Acute Rehab PT Goals Patient Stated Goal: return home PT Goal  Formulation: With patient Time For Goal Achievement: 12/08/23 Potential to Achieve Goals: Good Progress towards PT goals: Progressing toward goals    Frequency    Min 1X/week      PT Plan      Co-evaluation              AM-PAC PT "6 Clicks" Mobility   Outcome Measure  Help needed turning from your back to your side while in a flat bed without using bedrails?: A Little Help needed moving from lying on your back to sitting on the side of a flat bed without using bedrails?: A Little Help needed moving to and from a bed to a chair (including a wheelchair)?: A Little Help needed standing up from a chair using your arms (e.g., wheelchair or bedside chair)?: None Help needed to walk in hospital room?: None Help needed climbing 3-5 steps with a railing? : A Lot 6 Click Score: 19    End of Session Equipment Utilized During Treatment: Gait belt Activity Tolerance: Patient limited by pain;Patient limited by fatigue Patient left: in chair;with call bell/phone within reach;with chair alarm set Nurse Communication: Mobility status PT Visit Diagnosis: Unsteadiness on feet (R26.81);Muscle weakness (generalized) (M62.81);Pain;Difficulty in walking, not elsewhere classified (R26.2) Pain - Right/Left: Right Pain - part of body: Shoulder;Arm;Knee;Leg     Time: 1330-1400 PT Time Calculation (min) (ACUTE ONLY): 30 min  Charges:    $Gait Training: 8-22 mins $Therapeutic Activity: 8-22 mins PT General Charges $$ ACUTE PT VISIT: 1 Visit                     Madaline Guthrie, PT Acute Rehabilitation Services Office: [phone] 11/26/2023    [name] 11/26/2023, 2:09 PM

## 2023-11-26 NOTE — Discharge Summary (Signed)
 Physician Discharge Summary  Nicolas [name] NWG:[id number] DOB: [birth year] DOA: 11/18/2023  PCP: Dorothyann Peng, MD  Admit date: 11/18/2023  Discharge date: 11/26/2023  Discharge Diagnoses:  Principal Problem:   Sickle cell pain crisis (HCC) Active Problems:   Hypokalemia   AKI (acute kidney injury) (HCC)   Chronic pain   HTN (hypertension)   Discharge Condition: Stable  Disposition:   Follow-up Information     Dorothyann Peng, MD. Schedule an appointment as soon as possible for a visit in 1 week(s).   Specialty: Internal Medicine Contact information: [address] [place] [address] [phone]                Pt is discharged home in good condition and is to follow up with Dorothyann Peng, MD this week to have labs evaluated. Nicolas [name] is instructed to increase activity slowly and balance with rest for the next few days, and use prescribed medication to complete treatment of pain  Diet: Regular Wt Readings from Last 3 Encounters:  11/18/23 95.3 kg  08/13/23 100.2 kg  07/29/23 102.1 kg    History of present illness:    Hospital Course:  Patient was admitted for sickle cell pain crisis and managed appropriately with IVF, IV Dilaudid via PCA and IV Toradol, as well as other adjunct therapies per sickle cell pain management protocols.  Patient was therefore discharged home today in a hemodynamically stable condition.   Nicolas [name] will follow-up with PCP within 1 week of this discharge. Nicolas [name] was counseled extensively about nonpharmacologic means of pain management, patient verbalized understanding and was appreciative of  the care received during this admission.   We discussed the need for good hydration, monitoring of hydration status, avoidance of heat, cold, stress, and infection triggers. We discussed the need to be adherent with taking Hydrea and other home medications. Patient was reminded of the need to seek medical attention immediately if any  symptom of bleeding, anemia, or infection occurs.  Discharge Exam: Vitals:   11/26/23 0814 11/26/23 1004  BP:  (!) 162/97  Pulse:  100  Resp: 20 16  Temp:  98.8 F (37.1 C)  SpO2: 100% 98%   Vitals:   11/26/23 0416 11/26/23 0603 11/26/23 0814 11/26/23 1004  BP:  135/82  (!) 162/97  Pulse:  85  100  Resp: 14 14 20 16   Temp:  98.7 F (37.1 C)  98.8 F (37.1 C)  TempSrc:  Oral  Oral  SpO2: 97% 100% 100% 98%  Weight:      Height:        General appearance : Awake, alert, not in any distress. Speech Clear. Not toxic looking HEENT: Atraumatic and Normocephalic, pupils equally reactive to light and accomodation Neck: Supple, no JVD. No cervical lymphadenopathy.  Chest: Good air entry bilaterally, no added sounds  CVS: S1 S2 regular, no murmurs.  Abdomen: Bowel sounds present, Non tender and not distended with no gaurding, rigidity or rebound. Extremities: B/L Lower Ext shows no edema, both legs are warm to touch Neurology: Awake alert, and oriented X 3, CN II-XII intact, Non focal Skin: No Rash  Discharge Instructions  Discharge Instructions     Diet - low sodium heart healthy   Complete by: As directed    Increase activity slowly   Complete by: As directed       Allergies as of 11/26/2023   No Known Allergies      Medication List     TAKE these medications  accu-chek multiclix lancets Use as instructed What changed:  how much to take how to take this when to take this additional instructions   amLODipine 10 MG tablet Commonly known as: NORVASC Take 1 tablet (10 mg total) by mouth daily.   carvedilol 6.25 MG tablet Commonly known as: COREG Take 1 tablet (6.25 mg total) by mouth 2 (two) times daily with a meal.   folic acid 1 MG tablet Commonly known as: FOLVITE Take 1 tablet (1 mg total) by mouth daily. What changed: when to take this   glucose blood test strip Use as instructed What changed:  how much to take how to take this when to take  this   metFORMIN 500 MG tablet Commonly known as: GLUCOPHAGE Take 1 tablet (500 mg total) by mouth 2 (two) times daily with a meal.   ONE-A-DAY MENS PO Take 1 tablet by mouth daily with breakfast.   oxyCODONE-acetaminophen 7.5-325 MG tablet Commonly known as: PERCOCET Take 1 tablet by mouth every 4 (four) hours as needed for severe pain.   Ozempic (0.25 or 0.5 MG/DOSE) 2 MG/3ML Sopn Generic drug: Semaglutide(0.25 or 0.5MG /DOS) INJECT 0.5 MG INTO THE SKIN ONCE A WEEK. What changed: additional instructions   pravastatin 20 MG tablet Commonly known as: PRAVACHOL TAKE 1 TABLET BY MOUTH EVERY DAY IN THE EVENING What changed:  how much to take how to take this   prednisoLONE acetate 1 % ophthalmic suspension Commonly known as: PRED FORTE Place 1 drop into both eyes daily.   tiZANidine 4 MG tablet Commonly known as: ZANAFLEX TAKE 1 TABLET BY MOUTH EVERY DAY   triamterene-hydrochlorothiazide 37.5-25 MG tablet Commonly known as: MAXZIDE-25 TAKE 1 TABLET BY MOUTH EVERY DAY   valsartan 160 MG tablet Commonly known as: DIOVAN TAKE 1 TABLET BY MOUTH EVERY DAY   Vitamin D3 50 MCG (2000 UT) capsule Take 2,000 Units by mouth daily.        The results of significant diagnostics from this hospitalization (including imaging, microbiology, ancillary and laboratory) are listed below for reference.    Significant Diagnostic Studies: DG Chest Port 1 View Result Date: 11/22/2023 CLINICAL DATA:  History of sickle cell anemia with fever EXAM: PORTABLE CHEST 1 VIEW COMPARISON:  Chest radiograph dated 10/14/2022 FINDINGS: Low lung volumes with bronchovascular crowding. No focal consolidations. No pleural effusion or pneumothorax. The heart size and mediastinal contours are within normal limits. 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 IMPRESSION: Low lung volumes with bronchovascular crowding. No focal consolidations. Electronically Signed   By: Agustin Cree M.D.   On:  11/22/2023 10:21   CT Angio Abd/Pel W and/or Wo Contrast Result Date: 11/18/2023 CLINICAL DATA:  Severe flank pain, history of sickle cell disease. EXAM: CTA ABDOMEN AND PELVIS WITHOUT AND WITH CONTRAST TECHNIQUE: Multidetector CT imaging of the abdomen and pelvis was performed using the standard protocol during bolus administration of intravenous contrast. Multiplanar reconstructed images and MIPs were obtained and reviewed to evaluate the vascular anatomy. RADIATION DOSE REDUCTION: This exam was performed according to the departmental dose-optimization program which includes automated exposure control, adjustment of the mA and/or kV according to patient size and/or use of iterative reconstruction technique. CONTRAST:  75mL OMNIPAQUE IOHEXOL 350 MG/ML SOLN COMPARISON:  Prior CT scan of the chest 09/26/2016 FINDINGS: VASCULAR Aorta: Normal caliber aorta without aneurysm, dissection, vasculitis or significant stenosis. Celiac: Patent without evidence of aneurysm, dissection, vasculitis or significant stenosis. SMA: Patent without evidence of aneurysm, dissection, vasculitis or significant stenosis. Renals: Both renal  arteries are patent without evidence of aneurysm, dissection, vasculitis, fibromuscular dysplasia or significant stenosis. IMA: Patent without evidence of aneurysm, dissection, vasculitis or significant stenosis. Inflow: Patent without evidence of aneurysm, dissection, vasculitis or significant stenosis. Proximal Outflow: Bilateral common femoral and visualized portions of the superficial and profunda femoral arteries are patent without evidence of aneurysm, dissection, vasculitis or significant stenosis. Veins: No focal venous abnormality. Review of the MIP images confirms the above findings. NON-VASCULAR Lower chest: No acute abnormality. Chronic cardiomegaly likely related to high cardiac output in the setting of chronic anemia. Hepatobiliary: Normal hepatic contour and morphology. No discrete  hepatic lesion. Distended gallbladder with mild hyperenhancement of the mucosa. Small stones visualized. No biliary ductal dilatation. Pancreas: Unremarkable. No pancreatic ductal dilatation or surrounding inflammatory changes. 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 Adrenals/Urinary Tract: Normal adrenal glands. No hydronephrosis, nephrolithiasis or enhancing renal mass. No evidence of renal infarct. The ureters are unremarkable. Limited evaluation of the bladder due to extensive streak artifact from bilateral hip arthroplasty prostheses. Stomach/Bowel: Normal appendix in the right lower quadrant. No focal bowel wall thickening or evidence of obstruction. Lymphatic: No suspicious lymphadenopathy. Reproductive: Limited evaluation due to streak artifact from bilateral hip arthroplasty prostheses. Other: No evidence of ascites.  Fat containing umbilical hernia. Musculoskeletal: Expected H-shaped vertebral body changes throughout the visualized spine. Surgical changes of bilateral hip joint arthroplasty. IMPRESSION: 1. No evidence of arterial or venous vascular abnormality. 2. Diffusely distended gallbladder with mild hyperenhancement of the mucosa and small stones. If the patient's flank pain is localized to the right, acute cholecystitis would be a consideration. Right upper quadrant ultrasound can further evaluate if clinically warranted. 3. No evidence of kidney stones or renal infarct. 4. Expected sequelae of sickle cell disease including auto infarction of the spleen, H-shaped vertebral body changes and bilateral hip arthroplasty. 5. Fat containing umbilical hernia. Electronically Signed   By: Malachy Moan M.D.   On: 11/18/2023 16:03    Microbiology: Recent Results (from the past 240 hours)  Resp panel by RT-PCR (RSV, Flu A&B, Covid) Anterior Nasal Swab     Status: None   Collection Time: 11/22/23  1:00 AM   Specimen: Anterior Nasal Swab  Result Value Ref Range Status    SARS Coronavirus 2 by RT PCR NEGATIVE NEGATIVE Final    Comment: (NOTE) SARS-CoV-2 target nucleic acids are NOT DETECTED.  The SARS-CoV-2 RNA is generally detectable in upper respiratory specimens during the acute phase of infection. The lowest concentration of SARS-CoV-2 viral copies this assay can detect is 138 copies/mL. A negative result does not preclude SARS-Cov-2 infection and should not be used as the sole basis for treatment or other patient management decisions. A negative result may occur with  improper specimen collection/handling, submission of specimen other than nasopharyngeal swab, presence of viral mutation(s) within the areas targeted by this assay, and inadequate number of viral copies(<138 copies/mL). A negative result must be combined with clinical observations, patient history, and epidemiological information. The expected result is Negative.  Fact Sheet for Patients:  BloggerCourse.com  Fact Sheet for Healthcare Providers:  SeriousBroker.it  This test is no t yet approved or cleared by the [place] FDA and  has been authorized for detection and/or diagnosis of SARS-CoV-2 by FDA under an Emergency Use Authorization (EUA). This EUA will remain  in effect (meaning this test can be used) for the duration of the COVID-19 declaration under Section 564(b)(1) of the Act, 21 U.S.C.section 360bbb-3(b)(1), unless the authorization is  terminated  or revoked sooner.       Influenza A by PCR NEGATIVE NEGATIVE Final   Influenza B by PCR NEGATIVE NEGATIVE Final    Comment: (NOTE) The Xpert Xpress SARS-CoV-2/FLU/RSV plus assay is intended as an aid in the diagnosis of influenza from Nasopharyngeal swab specimens and should not be used as a sole basis for treatment. Nasal washings and aspirates are unacceptable for Xpert Xpress SARS-CoV-2/FLU/RSV testing.  Fact Sheet for  Patients: BloggerCourse.com  Fact Sheet for Healthcare Providers: SeriousBroker.it  This test is not yet approved or cleared by the [place] FDA and has been authorized for detection and/or diagnosis of SARS-CoV-2 by FDA under an Emergency Use Authorization (EUA). This EUA will remain in effect (meaning this test can be used) for the duration of the COVID-19 declaration under Section 564(b)(1) of the Act, 21 U.S.C. section 360bbb-3(b)(1), unless the authorization is terminated or revoked.     Resp Syncytial Virus by PCR NEGATIVE NEGATIVE Final    Comment: (NOTE) Fact Sheet for Patients: BloggerCourse.com  Fact Sheet for Healthcare Providers: SeriousBroker.it  This test is not yet approved or cleared by the [place] FDA and has been authorized for detection and/or diagnosis of SARS-CoV-2 by FDA under an Emergency Use Authorization (EUA). This EUA will remain in effect (meaning this test can be used) for the duration of the COVID-19 declaration under Section 564(b)(1) of the Act, 21 U.S.C. section 360bbb-3(b)(1), unless the authorization is terminated or revoked.  Performed at [facility], [address] [place], [place] [address]   Culture, blood (x 2)     Status: None (Preliminary result)   Collection Time: 11/22/23  2:01 AM   Specimen: BLOOD RIGHT HAND  Result Value Ref Range Status   Specimen Description   Final    BLOOD RIGHT HAND Performed at [facility], [address] [place], [place] [address]    Special Requests   Final    BOTTLES DRAWN AEROBIC ONLY Blood Culture results may not be optimal due to an inadequate volume of blood received in culture bottles Performed at [facility], [address] [place], [place] [address]    Culture   Final    NO GROWTH 4 DAYS Performed at [facility], [address] [place], [place] [address]    Report Status PENDING  Incomplete  Culture, blood (x 2)     Status: None (Preliminary result)   Collection Time: 11/22/23  2:01 AM   Specimen: BLOOD RIGHT HAND  Result Value Ref Range Status   Specimen Description   Final    BLOOD RIGHT HAND Performed at [facility], [address], [place], [place] [address]    Special Requests   Final    BOTTLES DRAWN AEROBIC ONLY Blood Culture results may not be optimal due to an inadequate volume of blood received in culture bottles Performed at [facility], [address], [place], [place] [address]    Culture   Final    NO GROWTH 4 DAYS Performed at [facility], [address], [place], [place] [address]    Report Status PENDING  Incomplete     Labs: Basic Metabolic Panel: Recent Labs  Lab 11/20/23 1031 11/22/23 0201 11/23/23 0920 11/25/23 0559  NA 140 141 141 140  K 3.3* 3.0* 3.3* 3.4*  CL 103 101 101 98  CO2 28 30 29 31   GLUCOSE 156* 165* 171* 149*  BUN 12 17 22* 23*  CREATININE 1.39* 1.74* 1.41* 1.38*  CALCIUM 8.6* 9.0 9.0 8.8*   Liver Function Tests: Recent Labs  Lab 11/20/23 1031 11/23/23 0920 11/25/23 0559  AST 40 72* 91*  ALT 24 32 57*  ALKPHOS 42 45 115  BILITOT 1.0 1.1 0.8  PROT 7.2 7.3 7.3  ALBUMIN 3.5 3.1* 2.7*   No results for input(s): "LIPASE", "AMYLASE" in the last 168 hours. No results for input(s): "AMMONIA" in the last 168 hours. CBC: Recent Labs  Lab 11/20/23 1031 11/22/23 0201 11/23/23 0920 11/25/23 0559  WBC 12.2* 13.2* 12.3* 8.0  NEUTROABS 9.5*  --  10.1* 5.3  HGB 9.6* 8.6* 8.4* 8.4*  HCT 27.8* 24.9* 24.0* 23.9*  MCV 85.0 84.7 83.9 81.6  PLT 276 273 286 389   Cardiac Enzymes: No results for input(s): "CKTOTAL", "CKMB", "CKMBINDEX", "TROPONINI" in the last 168 hours. BNP: Invalid input(s): "POCBNP" CBG: Recent Labs  Lab 11/19/23 1653  GLUCAP 135*    Time coordinating discharge: 50 minutes  Signed:  Daryll Drown NP  [facility] 11/26/2023, 12:26 PM

## 2023-11-27 LAB — CULTURE, BLOOD (ROUTINE X 2)
Culture: NO GROWTH
Culture: NO GROWTH

## 2023-12-10 ENCOUNTER — Ambulatory Visit: Payer: Self-pay | Admitting: Family Medicine

## 2023-12-10 ENCOUNTER — Encounter: Payer: Self-pay | Admitting: Family Medicine

## 2023-12-10 VITALS — BP 120/70 | HR 93 | Temp 98.1°F | Wt 220.0 lb

## 2023-12-10 DIAGNOSIS — G8929 Other chronic pain: Secondary | ICD-10-CM

## 2023-12-10 DIAGNOSIS — N182 Chronic kidney disease, stage 2 (mild): Secondary | ICD-10-CM

## 2023-12-10 DIAGNOSIS — I129 Hypertensive chronic kidney disease with stage 1 through stage 4 chronic kidney disease, or unspecified chronic kidney disease: Secondary | ICD-10-CM

## 2023-12-10 DIAGNOSIS — M545 Low back pain, unspecified: Secondary | ICD-10-CM

## 2023-12-10 DIAGNOSIS — D571 Sickle-cell disease without crisis: Secondary | ICD-10-CM

## 2023-12-10 MED ORDER — OXYCODONE-ACETAMINOPHEN 7.5-325 MG PO TABS: 1.0000 | ORAL_TABLET | ORAL | 0 refills | Status: AC | PRN

## 2023-12-10 NOTE — Progress Notes (Signed)
 I,[name], CMA,acting as a Neurosurgeon for [employer], NP.,have documented all relevant documentation on the behalf of [name], NP,as directed by  [name], NP while in the presence of [name], NP.  Subjective:  Patient ID: Nicolas [name] , male    DOB: 1976/09/17 , 48 y.o.   MRN: [id number]  Chief Complaint  Patient presents with   HOSPITAL F/U    HPI  Patient is a 48 year old male with medical diagnosis of sickle cell anemia, Type 2 diabetes and hypertensive nephropathy presents today for a hospital follow up. He was admitted on  2/19 and discharged on 11/26/23 for exacerbation of sickle cell anemia, he reports that he was in excruciating pains especially in his pain and hips. He states that since his discharge home, the pain has been moderate and he has no pain medication. His appointment at the [facility] [place] is in 2 weeks.  Patient was advised that  he will get a short term supply of his medication today until his appointment at [facility].     Past Medical History:  Diagnosis Date   Arthritis    arthritis- back & L hip   Depression    situational depression, pt. out of work    Diabetes mellitus (HCC)    HTN (hypertension) 05/10/2015   Osteoarthritis of left hip 06/07/2012   Sickle cell anemia (HCC)      Family History  Problem Relation Age of Onset   Diabetes Mother    Cancer - Other Father    Liver cancer Father    CAD Neg Hx      Current Outpatient Medications:    carvedilol (COREG) 6.25 MG tablet, Take 1 tablet (6.25 mg total) by mouth 2 (two) times daily with a meal., Disp: 180 tablet, Rfl: 1   Cholecalciferol (VITAMIN D3) 2000 units capsule, Take 2,000 Units by mouth daily., Disp: , Rfl: 5   folic acid (FOLVITE) 1 MG tablet, Take 1 tablet (1 mg total) by mouth daily. (Patient taking differently: Take 1 mg by mouth 2 (two) times daily.), Disp: 30 tablet, Rfl: 11   glucose blood test strip, Use as instructed (Patient taking  differently: 1 each by Other route every other day. Use as instructed), Disp: 100 each, Rfl: 12   Lancets (ACCU-CHEK MULTICLIX) lancets, Use as instructed (Patient taking differently: 1 each by Other route every other day.), Disp: 100 each, Rfl: 12   metFORMIN (GLUCOPHAGE) 500 MG tablet, Take 1 tablet (500 mg total) by mouth 2 (two) times daily with a meal., Disp: 180 tablet, Rfl: 1   Multiple Vitamin (ONE-A-DAY MENS PO), Take 1 tablet by mouth daily with breakfast., Disp: , Rfl:    oxyCODONE-acetaminophen (PERCOCET) 7.5-325 MG tablet, Take 1 tablet by mouth every 4 (four) hours as needed for severe pain (pain score 7-10)., Disp: 20 tablet, Rfl: 0   pravastatin (PRAVACHOL) 20 MG tablet, TAKE 1 TABLET BY MOUTH EVERY DAY IN THE EVENING (Patient taking differently: Take 20 mg by mouth every evening.), Disp: 90 tablet, Rfl: 2   Semaglutide,0.25 or 0.5MG /DOS, (OZEMPIC, 0.25 OR 0.5 MG/DOSE,) 2 MG/3ML SOPN, INJECT 0.5 MG INTO THE SKIN ONCE A WEEK. (Patient taking differently: Inject 0.5 mg into the skin once a week. Saturdays), Disp: 9 mL, Rfl: 3   tiZANidine (ZANAFLEX) 4 MG tablet, TAKE 1 TABLET BY MOUTH EVERY DAY, Disp: 90 tablet, Rfl: 2   valsartan (DIOVAN) 160 MG tablet, TAKE 1 TABLET BY MOUTH EVERY  DAY, Disp: 90 tablet, Rfl: 3   amLODipine (NORVASC) 10 MG tablet, TAKE 1 TABLET BY MOUTH EVERY DAY, Disp: 30 tablet, Rfl: 5   triamterene-hydrochlorothiazide (MAXZIDE-25) 37.5-25 MG tablet, TAKE 1 TABLET BY MOUTH EVERY DAY, Disp: 30 tablet, Rfl: 5   No Known Allergies   Review of Systems  Constitutional: Negative.   HENT: Negative.    Eyes: Negative.   Respiratory: Negative.    Cardiovascular: Negative.   Gastrointestinal: Negative.   Musculoskeletal:  Positive for arthralgias and back pain.  Skin: Negative.   Neurological: Negative.   Hematological: Negative.   Psychiatric/Behavioral: Negative.       Today's Vitals   12/10/23 0954  BP: 120/70  Pulse: 93  Temp: 98.1 F (36.7 C)  TempSrc:  Oral  Weight: 220 lb (99.8 kg)  PainSc: 7   PainLoc: Back   Body mass index is 33.45 kg/m.  Wt Readings from Last 3 Encounters:  12/10/23 220 lb (99.8 kg)  11/18/23 210 lb (95.3 kg)  08/13/23 221 lb (100.2 kg)    The ASCVD Risk score (Arnett DK, et al., 2019) failed to calculate for the following reasons:   The valid total cholesterol range is 130 to 320 mg/dL  Objective:  Physical Exam HENT:     Head: Normocephalic.  Cardiovascular:     Rate and Rhythm: Normal rate and regular rhythm.  Pulmonary:     Effort: Pulmonary effort is normal.     Breath sounds: Normal breath sounds.  Abdominal:     General: Bowel sounds are normal.  Musculoskeletal:        General: Tenderness present.  Skin:    General: Skin is warm and dry.  Neurological:     Mental Status: He is alert and oriented to person, place, and time. Mental status is at baseline.  Psychiatric:        Mood and Affect: Mood normal.        Behavior: Behavior normal.         Assessment And Plan:  Sickle cell disease without crisis (HCC) Assessment & Plan: Followed by [facility] Marcy Panning, [place]   Hypertensive nephropathy Assessment & Plan: Chronic.Continue current treatment valsartan 160mg , Maxzide 37.5/25mg  daily, amlodipine 10mg  and carvedilol 6.25 mg twice daily.    Chronic bilateral low back pain, unspecified whether sciatica present Assessment & Plan: Chronic. Take pain medication as prescribed.  Orders: -     oxyCODONE-Acetaminophen; Take 1 tablet by mouth every 4 (four) hours as needed for severe pain (pain score 7-10).  Dispense: 20 tablet; Refill: 0    Return if symptoms worsen or fail to improve, for keep next appt.  Patient was given opportunity to ask questions. Patient verbalized understanding of the plan and was able to repeat key elements of the plan. All questions were answered to their satisfaction.    I, [name], NP, have reviewed all documentation for this visit.  The documentation on 12/15/2023 for the exam, diagnosis, procedures, and orders are all accurate and complete.   IF YOU HAVE BEEN REFERRED TO A SPECIALIST, IT MAY TAKE 1-2 WEEKS TO SCHEDULE/PROCESS THE REFERRAL. IF YOU HAVE NOT HEARD FROM US/SPECIALIST IN TWO WEEKS, PLEASE GIVE [place] A CALL AT [phone].

## 2023-12-13 ENCOUNTER — Other Ambulatory Visit: Payer: Self-pay | Admitting: Internal Medicine

## 2023-12-15 NOTE — Assessment & Plan Note (Addendum)
 Chronic.Continue current treatment valsartan 160mg , Maxzide 37.5/25mg  daily, amlodipine 10mg  and carvedilol 6.25 mg twice daily.

## 2023-12-15 NOTE — Assessment & Plan Note (Signed)
 Followed by [facility], [place]

## 2023-12-15 NOTE — Assessment & Plan Note (Signed)
 Chronic. Take pain medication as prescribed.

## 2023-12-16 NOTE — Assessment & Plan Note (Signed)
Encouraged to keep BP well controlled and avoid use of NSAIDs

## 2023-12-21 DIAGNOSIS — E559 Vitamin D deficiency, unspecified: Secondary | ICD-10-CM | POA: Diagnosis not present

## 2023-12-21 DIAGNOSIS — N182 Chronic kidney disease, stage 2 (mild): Secondary | ICD-10-CM | POA: Diagnosis not present

## 2023-12-30 DIAGNOSIS — R809 Proteinuria, unspecified: Secondary | ICD-10-CM | POA: Diagnosis not present

## 2023-12-30 DIAGNOSIS — I129 Hypertensive chronic kidney disease with stage 1 through stage 4 chronic kidney disease, or unspecified chronic kidney disease: Secondary | ICD-10-CM | POA: Diagnosis not present

## 2023-12-30 DIAGNOSIS — E870 Hyperosmolality and hypernatremia: Secondary | ICD-10-CM | POA: Diagnosis not present

## 2023-12-30 DIAGNOSIS — N1831 Chronic kidney disease, stage 3a: Secondary | ICD-10-CM | POA: Diagnosis not present

## 2023-12-31 ENCOUNTER — Other Ambulatory Visit: Payer: Self-pay | Admitting: Family Medicine

## 2024-01-07 DIAGNOSIS — Q8901 Asplenia (congenital): Secondary | ICD-10-CM | POA: Diagnosis not present

## 2024-01-07 DIAGNOSIS — G8929 Other chronic pain: Secondary | ICD-10-CM | POA: Diagnosis not present

## 2024-01-07 DIAGNOSIS — E559 Vitamin D deficiency, unspecified: Secondary | ICD-10-CM | POA: Diagnosis not present

## 2024-02-11 ENCOUNTER — Encounter: Payer: Self-pay | Admitting: Internal Medicine

## 2024-02-11 NOTE — Progress Notes (Signed)
SCANNED DOCUMENT

## 2024-02-25 NOTE — Progress Notes (Incomplete)
 [facility] - Clinic Note  02/26/2024   CHIEF COMPLAINT Patient presents for No chief complaint on file.  HISTORY OF PRESENT ILLNESS: Nicolas [name] is a 48 y.o. male who presents to the clinic today for:   Pt states he is off all drops   Referring physician: Cleave Curling, MD [address] [place],  [place] [address]  HISTORICAL INFORMATION:  Selected notes from the MEDICAL RECORD NUMBER Referred by Dr. Jolena Nay for TRD OS LEE:  Ocular Hx- PMH-   CURRENT MEDICATIONS: No current outpatient medications on file. (Ophthalmic Drugs)   No current facility-administered medications for this visit. (Ophthalmic Drugs)   Current Outpatient Medications (Other)  Medication Sig   amLODipine  (NORVASC ) 10 MG tablet TAKE 1 TABLET BY MOUTH EVERY DAY   carvedilol  (COREG ) 6.25 MG tablet Take 1 tablet (6.25 mg total) by mouth 2 (two) times daily with a meal.   Cholecalciferol  (VITAMIN D3) 2000 units capsule Take 2,000 Units by mouth daily.   folic acid  (FOLVITE ) 1 MG tablet Take 1 tablet (1 mg total) by mouth daily. (Patient taking differently: Take 1 mg by mouth 2 (two) times daily.)   glucose blood test strip Use as instructed (Patient taking differently: 1 each by Other route every other day. Use as instructed)   Lancets (ACCU-CHEK MULTICLIX) lancets Use as instructed (Patient taking differently: 1 each by Other route every other day.)   metFORMIN (GLUCOPHAGE) 500 MG tablet Take 1 tablet (500 mg total) by mouth 2 (two) times daily with a meal.   Multiple Vitamin (ONE-A-DAY MENS PO) Take 1 tablet by mouth daily with breakfast.   oxyCODONE -acetaminophen  (PERCOCET) 7.5-325 MG tablet Take 1 tablet by mouth every 4 (four) hours as needed for severe pain (pain score 7-10).   pravastatin  (PRAVACHOL ) 20 MG tablet TAKE 1 TABLET BY MOUTH EVERY DAY IN THE EVENING (Patient taking differently: Take 20 mg by mouth every evening.)   Semaglutide ,0.25 or 0.5MG /DOS, (OZEMPIC ,  0.25 OR 0.5 MG/DOSE,) 2 MG/3ML SOPN INJECT 0.5 MG INTO THE SKIN ONCE A WEEK. (Patient taking differently: Inject 0.5 mg into the skin once a week. Saturdays)   tiZANidine  (ZANAFLEX ) 4 MG tablet TAKE 1 TABLET BY MOUTH EVERY DAY   triamterene -hydrochlorothiazide  (MAXZIDE -25) 37.5-25 MG tablet TAKE 1 TABLET BY MOUTH EVERY DAY   valsartan  (DIOVAN ) 160 MG tablet TAKE 1 TABLET BY MOUTH EVERY DAY   No current facility-administered medications for this visit. (Other)   REVIEW OF SYSTEMS:    ALLERGIES No Known Allergies PAST MEDICAL HISTORY Past Medical History:  Diagnosis Date   Arthritis    arthritis- back & L hip   Depression    situational depression, pt. out of work    Diabetes mellitus (HCC)    HTN (hypertension) 05/10/2015   Osteoarthritis of left hip 06/07/2012   Sickle cell anemia (HCC)    Past Surgical History:  Procedure Laterality Date   IR GENERIC HISTORICAL  09/26/2016   IR FLUORO GUIDE CV LINE RIGHT 09/26/2016 Erica Hau, MD [facility]   IR GENERIC HISTORICAL  09/26/2016   IR US  GUIDE VASC ACCESS RIGHT 09/26/2016 Erica Hau, MD [facility]   JOINT REPLACEMENT Right 2012   R hip   PHOTOCOAGULATION WITH LASER Right 02/26/2023   Procedure: PHOTOCOAGULATION WITH LASER;  Surgeon: Ronelle Coffee, MD;  Location: [facility] OR;  Service: Ophthalmology;  Laterality: Right;   TOTAL HIP ARTHROPLASTY  06/07/2012   Procedure: TOTAL HIP ARTHROPLASTY;  Surgeon: Neville Barbone, MD;  Location: [facility] OR;  Service: Orthopedics;  Laterality: Left;   VITRECTOMY 25 GAUGE WITH SCLERAL BUCKLE Left 02/26/2023   Procedure: VITRECTOMY 25 GAUGE WITH SCLERAL BUCKLE;  Surgeon: Ronelle Coffee, MD;  Location: [facility] OR;  Service: Ophthalmology;  Laterality: Left;   FAMILY HISTORY Family History  Problem Relation Age of Onset   Diabetes Mother    Cancer - Other Father    Liver cancer Father    CAD Neg Hx    SOCIAL HISTORY Social History   Tobacco Use   Smoking status: Never   Smokeless tobacco:  Never  Vaping Use   Vaping status: Never Used  Substance Use Topics   Alcohol use: Not Currently    Comment: ocassional    Drug use: Never       OPHTHALMIC EXAM:  Not recorded    IMAGING AND PROCEDURES  Imaging and Procedures for 02/26/2024          ASSESSMENT/PLAN: No diagnosis found.  Retinal detachment, OS - at presentation, pt reported ~1 month history of decreased vision OS - exam showed near total tractional detachment w/ ?rhegmatogenous component -- detachment greatest in nasal and inferior quadrants - focal areas of tractional fibrosis nasal and inferotemporal periphery w/ associated neovascularization  - retinal hole at 0800 - pt with history of sickle cell disease ([place]) -- retinopathy likely etiology of traction and fibrosis - s/p PPV/PFO/EL/FAX/14% C3F8 OS, 05.30.2024             - doing well - retina attached and in good position -- good buckle height and laser around breaks - gas bubble gone             - IOP 16  - BCVA OS - 20/80 from 20/100 -- s/p CEIOL OS w/ Dr. Candi Chafe on 08.16.24  - off all drops now           - f/u 6-9 months -- DFE/OCT  2,3. 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 Hypertensive retinopathy OU - discussed importance of tight BP control - monitor  6-8. Diabetes mellitus, type 2 without retinopathy  - A1c 4.2 on 09.24.24  - FA (05.23.24) with no significant MA OU - The incidence, risk factors for progression, natural history and treatment options for diabetic retinopathy  were discussed with patient.   - The need for close monitoring of blood glucose, blood pressure, and serum lipids, avoiding cigarette or any type of tobacco, and the need for long term follow up was also discussed with patient. - f/u in 1 year, sooner prn  9. Mixed Cataract OD - The  symptoms of cataract, surgical options, and treatments and risks were discussed with patient. - discussed diagnosis and progression - under the expert management of Dr. Candi Chafe  10. Pseudophakia OS  - s/p CE/IOL OS (08.16.24, Dr. Candi Chafe)  - IOL in good position, doing well  - monitor   Ophthalmic Meds Ordered this visit:  No orders of the defined types were placed in this encounter.    No follow-ups on file.  There are no Patient Instructions on file for this visit.  This document serves as a record of services personally performed by Jeanice Millard, MD, PhD. It was created on their behalf by Eller Gut COT, an ophthalmic technician. The creation of this  record is the provider's dictation and/or activities during the visit.    Electronically signed by: Eller Gut COT 05.29.2025  9:33 AM   Abbreviations: M myopia (nearsighted); A astigmatism; H hyperopia (farsighted); P presbyopia; Mrx spectacle prescription;  CTL contact lenses; OD right eye; OS left eye; OU both eyes  XT exotropia; ET esotropia; PEK punctate epithelial keratitis; PEE punctate epithelial erosions; DES dry eye syndrome; MGD meibomian gland dysfunction; ATs artificial tears; PFAT's preservative free artificial tears; NSC nuclear sclerotic cataract; PSC posterior subcapsular cataract; ERM epi-retinal membrane; PVD posterior vitreous detachment; RD retinal detachment; DM diabetes mellitus; DR diabetic retinopathy; NPDR non-proliferative diabetic retinopathy; PDR proliferative diabetic retinopathy; CSME clinically significant macular edema; DME diabetic macular edema; dbh dot blot hemorrhages; CWS cotton wool spot; POAG primary open angle glaucoma; C/D cup-to-disc ratio; HVF humphrey visual field; GVF goldmann visual field; OCT optical coherence tomography; IOP intraocular pressure; BRVO Branch retinal vein occlusion; CRVO central retinal vein occlusion; CRAO central retinal artery occlusion; BRAO branch retinal  artery occlusion; RT retinal tear; SB scleral buckle; PPV pars plana vitrectomy; VH Vitreous hemorrhage; PRP panretinal laser photocoagulation; IVK intravitreal kenalog ; VMT vitreomacular traction; MH Macular hole;  NVD neovascularization of the disc; NVE neovascularization elsewhere; AREDS age related eye disease study; ARMD age related macular degeneration; POAG primary open angle glaucoma; EBMD epithelial/anterior basement membrane dystrophy; ACIOL anterior chamber intraocular lens; IOL intraocular lens; PCIOL posterior chamber intraocular lens; Phaco/IOL phacoemulsification with intraocular lens placement; PRK photorefractive keratectomy; LASIK laser assisted in situ keratomileusis; HTN hypertension; DM diabetes mellitus; COPD chronic obstructive pulmonary disease

## 2024-02-26 ENCOUNTER — Encounter (INDEPENDENT_AMBULATORY_CARE_PROVIDER_SITE_OTHER): Payer: Self-pay | Admitting: Ophthalmology

## 2024-02-26 ENCOUNTER — Ambulatory Visit (INDEPENDENT_AMBULATORY_CARE_PROVIDER_SITE_OTHER): Admitting: Ophthalmology

## 2024-02-26 DIAGNOSIS — Z7984 Long term (current) use of oral hypoglycemic drugs: Secondary | ICD-10-CM | POA: Diagnosis not present

## 2024-02-26 DIAGNOSIS — Z961 Presence of intraocular lens: Secondary | ICD-10-CM | POA: Diagnosis not present

## 2024-02-26 DIAGNOSIS — Z7985 Long-term (current) use of injectable non-insulin antidiabetic drugs: Secondary | ICD-10-CM

## 2024-02-26 DIAGNOSIS — H25811 Combined forms of age-related cataract, right eye: Secondary | ICD-10-CM | POA: Diagnosis not present

## 2024-02-26 DIAGNOSIS — I10 Essential (primary) hypertension: Secondary | ICD-10-CM | POA: Diagnosis not present

## 2024-02-26 DIAGNOSIS — D571 Sickle-cell disease without crisis: Secondary | ICD-10-CM

## 2024-02-26 DIAGNOSIS — E119 Type 2 diabetes mellitus without complications: Secondary | ICD-10-CM | POA: Diagnosis not present

## 2024-02-26 DIAGNOSIS — H35033 Hypertensive retinopathy, bilateral: Secondary | ICD-10-CM

## 2024-02-26 DIAGNOSIS — H3322 Serous retinal detachment, left eye: Secondary | ICD-10-CM

## 2024-02-26 DIAGNOSIS — H36829 Proliferative sickle-cell retinopathy, unspecified eye: Secondary | ICD-10-CM | POA: Diagnosis not present

## 2024-02-26 LAB — HM DIABETES EYE EXAM

## 2024-02-26 NOTE — Progress Notes (Addendum)
 [facility] - Clinic Note  02/26/2024   CHIEF COMPLAINT Patient presents for Retina Follow Up  HISTORY OF PRESENT ILLNESS: Nicolas [name] is a 48 y.o. male who presents to the clinic today for:  HPI     Retina Follow Up   Patient presents with  Other.  In left eye.  This started 7 months ago.  I, the attending physician,  performed the HPI with the patient and updated documentation appropriately.        Comments   Patient here for 7 months retina follow up for RD OS. Patient states vision so, so. Better than before. No eye pain. Not using drops.      Last edited by Ronelle Coffee, MD on 02/26/2024 11:55 AM.    Pt states he is off all drops   Referring physician: Cleave Curling, MD [address] [place],  [place] [address]  HISTORICAL INFORMATION:  Selected notes from the MEDICAL RECORD NUMBER Referred by Dr. Jolena Nay for TRD OS LEE:  Ocular Hx- PMH-   CURRENT MEDICATIONS: No current outpatient medications on file. (Ophthalmic Drugs)   No current facility-administered medications for this visit. (Ophthalmic Drugs)   Current Outpatient Medications (Other)  Medication Sig   amLODipine  (NORVASC ) 10 MG tablet TAKE 1 TABLET BY MOUTH EVERY DAY   carvedilol  (COREG ) 6.25 MG tablet Take 1 tablet (6.25 mg total) by mouth 2 (two) times daily with a meal.   Cholecalciferol  (VITAMIN D3) 2000 units capsule Take 2,000 Units by mouth daily.   folic acid  (FOLVITE ) 1 MG tablet Take 1 tablet (1 mg total) by mouth daily. (Patient taking differently: Take 1 mg by mouth 2 (two) times daily.)   glucose blood test strip Use as instructed (Patient taking differently: 1 each by Other route every other day. Use as instructed)   Lancets (ACCU-CHEK MULTICLIX) lancets Use as instructed (Patient taking differently: 1 each by Other route every other day.)   metFORMIN (GLUCOPHAGE) 500 MG tablet Take 1 tablet (500 mg total) by mouth 2 (two) times daily with a meal.    Multiple Vitamin (ONE-A-DAY MENS PO) Take 1 tablet by mouth daily with breakfast.   oxyCODONE -acetaminophen  (PERCOCET) 7.5-325 MG tablet Take 1 tablet by mouth every 4 (four) hours as needed for severe pain (pain score 7-10).   pravastatin  (PRAVACHOL ) 20 MG tablet TAKE 1 TABLET BY MOUTH EVERY DAY IN THE EVENING (Patient taking differently: Take 20 mg by mouth every evening.)   Semaglutide ,0.25 or 0.5MG /DOS, (OZEMPIC , 0.25 OR 0.5 MG/DOSE,) 2 MG/3ML SOPN INJECT 0.5 MG INTO THE SKIN ONCE A WEEK. (Patient taking differently: Inject 0.5 mg into the skin once a week. Saturdays)   tiZANidine  (ZANAFLEX ) 4 MG tablet TAKE 1 TABLET BY MOUTH EVERY DAY   triamterene -hydrochlorothiazide  (MAXZIDE -25) 37.5-25 MG tablet TAKE 1 TABLET BY MOUTH EVERY DAY   valsartan  (DIOVAN ) 160 MG tablet TAKE 1 TABLET BY MOUTH EVERY DAY   No current facility-administered medications for this visit. (Other)   REVIEW OF SYSTEMS: ROS   Positive for: Endocrine, Cardiovascular, Eyes Negative for: Constitutional, Gastrointestinal, Neurological, Skin, Genitourinary, Musculoskeletal, HENT, Respiratory, Psychiatric, Allergic/Imm, Heme/Lymph Last edited by Sylvan Evener, COA on 02/26/2024  8:35 AM.       ALLERGIES No Known Allergies PAST MEDICAL HISTORY Past Medical History:  Diagnosis Date   Arthritis    arthritis- back & L hip   Depression    situational depression, pt. out of work    Diabetes mellitus (HCC)  HTN (hypertension) 05/10/2015   Osteoarthritis of left hip 06/07/2012   Sickle cell anemia [facility])    Past Surgical History:  Procedure Laterality Date   IR GENERIC HISTORICAL  09/26/2016   IR FLUORO GUIDE CV LINE RIGHT 09/26/2016 Erica Hau, MD [facility]   IR GENERIC HISTORICAL  09/26/2016   IR US  GUIDE VASC ACCESS RIGHT 09/26/2016 Erica Hau, MD [facility]   JOINT REPLACEMENT Right 2012   R hip   PHOTOCOAGULATION WITH LASER Right 02/26/2023   Procedure: PHOTOCOAGULATION WITH LASER;  Surgeon:  Ronelle Coffee, MD;  Location: [facility] OR;  Service: Ophthalmology;  Laterality: Right;   TOTAL HIP ARTHROPLASTY  06/07/2012   Procedure: TOTAL HIP ARTHROPLASTY;  Surgeon: Neville Barbone, MD;  Location: [facility] OR;  Service: Orthopedics;  Laterality: Left;   VITRECTOMY 25 GAUGE WITH SCLERAL BUCKLE Left 02/26/2023   Procedure: VITRECTOMY 25 GAUGE WITH SCLERAL BUCKLE;  Surgeon: Ronelle Coffee, MD;  Location: [facility] OR;  Service: Ophthalmology;  Laterality: Left;   FAMILY HISTORY Family History  Problem Relation Age of Onset   Diabetes Mother    Cancer - Other Father    Liver cancer Father    CAD Neg Hx    SOCIAL HISTORY Social History   Tobacco Use   Smoking status: Never   Smokeless tobacco: Never  Vaping Use   Vaping status: Never Used  Substance Use Topics   Alcohol use: Not Currently    Comment: ocassional    Drug use: Never       OPHTHALMIC EXAM:  Base Eye Exam     Visual Acuity (Snellen - Linear)       Right Left   Dist [place] 20/20 -1 20/100 -2   Dist ph [place]  20/70    Correction: Glasses         Tonometry (Tonopen, 8:31 AM)       Right Left   Pressure 20 17         Pupils       Dark Light Shape React APD   Right 3 2 Round Brisk None   Left 5 5 Round Minimal None         Visual Fields (Counting fingers)       Left Right    Full Full         Extraocular Movement       Right Left    Full, Ortho Full, Ortho         Neuro/Psych     Oriented x3: Yes   Mood/Affect: Normal         Dilation     Both eyes: 1.0% Mydriacyl , 2.5% Phenylephrine  @ 8:31 AM           Slit Lamp and Fundus Exam     Slit Lamp Exam       Right Left   Lids/Lashes Dermatochalasis - upper lid Dermatochalasis - upper lid   Conjunctiva/Sclera Melanosis Melanosis   Cornea arcus, mild tear film debris arcus, trace PEE, tear film debris, well healed cataract wound   Anterior Chamber deep, clear, narrow temporal angle Deep, 1/2+ fine cell/pigment   Iris Round and reactive  Round and dilated   Lens 2+ Nuclear sclerosis, 2+ Cortical cataract PC IOL in good position with fine pigment on posterior capsule, mild PCO   Anterior Vitreous clear Post vitrectomy, residual anterior hyaloid         Fundus Exam       Right Left   Disc Pink and  Sharp, +PPP Trace temporal pallor, sharp rim, mild PPP   C/D Ratio 0.6 0.6   Macula Flat, Blunted foveal reflex, RPE mottling, No heme or edema Flat, Blunted foveal reflex, [place], mild fibrosis ST mac, mild ERM with striae inferior mac   Vessels attenuated, mild tortuosity, copper wiring, fibrotic NV IT, early NV nasal periphery attenuated, Tortuous, copper wiring, fibrotic NVE IT periphery with surrounding heme improved, fibrotic NVE nasal periphery   Periphery Attached, fibrotic NV IT, early NV nasal periphery, good segmental PRP changes surrounding both areas, no new NV/heme Retina attached over buckle, good buckle height, good laser over buckle, tractional fibrosis improved, no heme PRE-OP: total TRD greatest nasal and inferior quads; fibrotic NVE nasal and IT periphery; ?stretch hole at 0800           Refraction     Wearing Rx       Sphere Cylinder Axis Add   Right +1.25 +0.25 086 +2.50   Left +1.25 +0.25 093 +2.50           IMAGING AND PROCEDURES  Imaging and Procedures for 02/26/2024  OCT, Retina - OU - Both Eyes        Right Eye Quality was good. Central Foveal Thickness: 206. Progression has been stable. Findings include normal foveal contour, no IRF, no SRF, vitreomacular adhesion .   Left Eye Quality was good. Central Foveal Thickness: 190. Progression has improved. Findings include normal foveal contour, no IRF, no SRF, myopic contour, epiretinal membrane, macular pucker (Retina re-attached, patchy ORA with interval improvement in ellipsoid signal, stable ERM with pucker, mild IN schisis caught on widefield -- improved, focal cystic changes IT fovea).   Notes  *Images captured and stored on  drive  Diagnosis / Impression:  OD: NFP, no IRF/SRF OS: Retina re-attached, patchy ORA with interval improvement in ellipsoid signal, stable ERM with pucker, mild IN schisis caught on widefield -- slightly improved  Clinical management:  See below  Abbreviations: NFP - Normal foveal profile. CME - cystoid macular edema. PED - pigment epithelial detachment. IRF - intraretinal fluid. SRF - subretinal fluid. EZ - ellipsoid zone. ERM - epiretinal membrane. ORA - outer retinal atrophy. ORT - outer retinal tubulation. SRHM - subretinal hyper-reflective material. IRHM - intraretinal hyper-reflective material      Color Fundus Photography Optos - OU - Both Eyes        Right Eye Progression has improved. Disc findings include normal observations. Macula : epiretinal membrane. Vessels : tortuous vessels, attenuated, Neovascularization (Regressed, fibrotic seafan IT periphery). Periphery : neovascularization, RPE abnormality (Regressed, fibrotic seafan IT periphery; pigmented laser scars nasal and IT periphery).   Left Eye Progression has improved. Vessels : tortuous vessels, attenuated (No neovascularization). Periphery : RPE abnormality.   Notes  **Images stored on drive**  Impression: OD: Regressed, fibrotic seafan IT periphery; pigmented laser scars nasal and IT periphery OS: s/p scleral buckle w/ retina attached over buckle; good laser changes over buckle; fibrosis and hemorrhages improved           ASSESSMENT/PLAN:   ICD-10-CM   1. Left retinal detachment  H33.22 OCT, Retina - OU - Both Eyes    Color Fundus Photography Optos - OU - Both Eyes    2. 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 Sickle cell disease without crisis (HCC)  D57.1     4. Essential hypertension  I10     5. Hypertensive retinopathy of both  eyes  H35.033     6. Diabetes mellitus type 2 without retinopathy (HCC)  E11.9     7. Long term (current) use of oral  hypoglycemic drugs  Z79.84     8. Long-term (current) use of injectable non-insulin  antidiabetic drugs  Z79.85     9. Combined forms of age-related cataract of right eye  H25.811     10. Pseudophakia of left eye  Z96.1      Retinal detachment, OS - at presentation, pt reported ~1 month history of decreased vision OS - exam showed near total tractional detachment w/ ?rhegmatogenous component -- detachment greatest in nasal and inferior quadrants - focal areas of tractional fibrosis nasal and inferotemporal periphery w/ associated neovascularization  - retinal hole at 0800 - pt with history of sickle cell disease ([place]) -- retinopathy likely etiology of traction and fibrosis - s/p PPV/PFO/EL/FAX/14% C3F8 OS, 05.30.2024             - doing well - retina attached and in good position -- good buckle height and laser around breaks             - IOP 17  - BCVA OS - 20/70 improved from 20/80 -- s/p CEIOL OS w/ Dr. Candi Chafe on 08.16.24  - off all drops now           - f/u 6-9 months DFE, OCT, FA (transit OS)  2,3. 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 Hypertensive retinopathy OU - discussed importance of tight BP control - monitor  6-8. Diabetes mellitus, type 2 without retinopathy  - A1c 4.2 on 09.24.24  - FA (05.23.24) with no significant MA OU - The incidence, risk factors for progression, natural history and treatment options for diabetic retinopathy  were discussed with patient.   - The need for close monitoring of blood glucose, blood pressure, and serum lipids, avoiding cigarette or any type of tobacco, and the need for long term follow up was also discussed with patient. - f/u in 1 year, sooner prn  9. Mixed Cataract OD -  The symptoms of cataract, surgical options, and treatments and risks were discussed with patient. - discussed diagnosis and progression - under the expert management of Dr. Candi Chafe  10. Pseudophakia OS  - s/p CE/IOL OS (08.16.24, Dr. Candi Chafe)  - IOL in good position, doing well  - monitor  Ophthalmic Meds Ordered this visit:  No orders of the defined types were placed in this encounter.    Return for 6-9 months - f/u sickle cell retinopathy - DFE, OCT, possible FA.  There are no Patient Instructions on file for this visit.  This document serves as a record of services personally performed by Jeanice Millard, MD, PhD. It was created on their behalf by Eller Gut COT, an ophthalmic technician. The creation of this record is the provider's dictation and/or activities during the visit.    Electronically signed by: Eller Gut COT 05.29.2025  12:11 PM  This document serves as a record of services personally performed by Jeanice Millard, MD, PhD. It was created on their behalf by Diona Franklin, COMT. The creation of this record is  the provider's dictation and/or activities during the visit.  Electronically signed by: Diona Franklin, COMT 02/26/24 12:11 PM  Jeanice Millard, M.D., Ph.D. Diseases & Surgery of the Retina and Vitreous [facility] 02/26/2024   I have reviewed the above documentation for accuracy and completeness, and I agree with the above. Jeanice Millard, M.D., Ph.D. 02/26/24 12:11 PM    Abbreviations: M myopia (nearsighted); A astigmatism; H hyperopia (farsighted); P presbyopia; Mrx spectacle prescription;  CTL contact lenses; OD right eye; OS left eye; OU both eyes  XT exotropia; ET esotropia; PEK punctate epithelial keratitis; PEE punctate epithelial erosions; DES dry eye syndrome; MGD meibomian gland dysfunction; ATs artificial tears; PFAT's preservative free artificial tears; NSC nuclear sclerotic cataract; PSC posterior subcapsular  cataract; ERM epi-retinal membrane; PVD posterior vitreous detachment; RD retinal detachment; DM diabetes mellitus; DR diabetic retinopathy; NPDR non-proliferative diabetic retinopathy; PDR proliferative diabetic retinopathy; CSME clinically significant macular edema; DME diabetic macular edema; dbh dot blot hemorrhages; CWS cotton wool spot; POAG primary open angle glaucoma; C/D cup-to-disc ratio; HVF humphrey visual field; GVF goldmann visual field; OCT optical coherence tomography; IOP intraocular pressure; BRVO Branch retinal vein occlusion; CRVO central retinal vein occlusion; CRAO central retinal artery occlusion; BRAO branch retinal artery occlusion; RT retinal tear; SB scleral buckle; PPV pars plana vitrectomy; VH Vitreous hemorrhage; PRP panretinal laser photocoagulation; IVK intravitreal kenalog ; VMT vitreomacular traction; MH Macular hole;  NVD neovascularization of the disc; NVE neovascularization elsewhere; AREDS age related eye disease study; ARMD age related macular degeneration; POAG primary open angle glaucoma; EBMD epithelial/anterior basement membrane dystrophy; ACIOL anterior chamber intraocular lens; IOL intraocular lens; PCIOL posterior chamber intraocular lens; Phaco/IOL phacoemulsification with intraocular lens placement; PRK photorefractive keratectomy; LASIK laser assisted in situ keratomileusis; HTN hypertension; DM diabetes mellitus; COPD chronic obstructive pulmonary disease

## 2024-02-29 ENCOUNTER — Encounter (INDEPENDENT_AMBULATORY_CARE_PROVIDER_SITE_OTHER): Admitting: Ophthalmology

## 2024-03-08 ENCOUNTER — Encounter: Payer: 59 | Admitting: Internal Medicine

## 2024-03-08 NOTE — Progress Notes (Deleted)
 I,[name], CMA,acting as a Neurosurgeon for Smiley Dung, MD.,have documented all relevant documentation on the behalf of Smiley Dung, MD,as directed by  Smiley Dung, MD while in the presence of Smiley Dung, MD.  Subjective:   Patient ID: Nicolas [name] , male    DOB: 1975-10-27 , 48 y.o.   MRN: [id number]  No chief complaint on file.   HPI  He is here today for a full physical exam.  He has no specific concerns or complaints at this time.  He reports compliance with meds, denies headaches, chest pain and shortness of breath. Does not wish to have prostate exam performed today.   E  Diabetes He presents for his follow-up diabetic visit. He has type 2 diabetes mellitus. There are no hypoglycemic associated symptoms. Pertinent negatives for diabetes include no blurred vision. There are no hypoglycemic complications. Diabetic complications include nephropathy. Risk factors for coronary artery disease include diabetes mellitus, dyslipidemia, hypertension, male sex, sedentary lifestyle and obesity. Current diabetic treatment includes oral agent (dual therapy). He is following a diabetic diet. He participates in exercise intermittently. His home blood glucose trend is fluctuating minimally. His breakfast blood glucose is taken between 8-9 am. His breakfast blood glucose range is generally 110-130 mg/dl. An ACE inhibitor/angiotensin II receptor blocker is being taken. He does not see a podiatrist.Eye exam is current.  Hypertension This is a chronic problem. The current episode started more than 1 year ago. The problem has been gradually improving since onset. The problem is controlled. Pertinent negatives include no blurred vision. Past treatments include ACE inhibitors, calcium  channel blockers and beta blockers. The current treatment provides moderate improvement. Compliance problems include exercise.  Hypertensive end-organ damage includes kidney disease.    Past Medical  History:  Diagnosis Date  . Arthritis    arthritis- back & L hip  . Depression    situational depression, pt. out of work   . Diabetes mellitus (HCC)   . HTN (hypertension) 05/10/2015  . Osteoarthritis of left hip 06/07/2012  . Sickle cell anemia (HCC)      Family History  Problem Relation Age of Onset  . Diabetes Mother   . Cancer - Other Father   . Liver cancer Father   . CAD Neg Hx      Current Outpatient Medications:  .  amLODipine  (NORVASC ) 10 MG tablet, TAKE 1 TABLET BY MOUTH EVERY DAY, Disp: 30 tablet, Rfl: 5 .  carvedilol  (COREG ) 6.25 MG tablet, Take 1 tablet (6.25 mg total) by mouth 2 (two) times daily with a meal., Disp: 180 tablet, Rfl: 1 .  Cholecalciferol  (VITAMIN D3) 2000 units capsule, Take 2,000 Units by mouth daily., Disp: , Rfl: 5 .  folic acid  (FOLVITE ) 1 MG tablet, Take 1 tablet (1 mg total) by mouth daily. (Patient taking differently: Take 1 mg by mouth 2 (two) times daily.), Disp: 30 tablet, Rfl: 11 .  glucose blood test strip, Use as instructed (Patient taking differently: 1 each by Other route every other day. Use as instructed), Disp: 100 each, Rfl: 12 .  Lancets (ACCU-CHEK MULTICLIX) lancets, Use as instructed (Patient taking differently: 1 each by Other route every other day.), Disp: 100 each, Rfl: 12 .  metFORMIN (GLUCOPHAGE) 500 MG tablet, Take 1 tablet (500 mg total) by mouth 2 (two) times daily with a meal., Disp: 180 tablet, Rfl: 1 .  Multiple Vitamin (ONE-A-DAY MENS PO), Take 1 tablet by mouth daily with breakfast., Disp: , Rfl:  .  oxyCODONE -acetaminophen  (PERCOCET) 7.5-325 MG tablet, Take 1 tablet by mouth every 4 (four) hours as needed for severe pain (pain score 7-10)., Disp: 20 tablet, Rfl: 0 .  pravastatin  (PRAVACHOL ) 20 MG tablet, TAKE 1 TABLET BY MOUTH EVERY DAY IN THE EVENING (Patient taking differently: Take 20 mg by mouth every evening.), Disp: 90 tablet, Rfl: 2 .  Semaglutide ,0.25 or 0.5MG /DOS, (OZEMPIC , 0.25 OR 0.5 MG/DOSE,) 2 MG/3ML SOPN,  INJECT 0.5 MG INTO THE SKIN ONCE A WEEK. (Patient taking differently: Inject 0.5 mg into the skin once a week. Saturdays), Disp: 9 mL, Rfl: 3 .  tiZANidine  (ZANAFLEX ) 4 MG tablet, TAKE 1 TABLET BY MOUTH EVERY DAY, Disp: 90 tablet, Rfl: 2 .  triamterene -hydrochlorothiazide  (MAXZIDE -25) 37.5-25 MG tablet, TAKE 1 TABLET BY MOUTH EVERY DAY, Disp: 30 tablet, Rfl: 5 .  valsartan  (DIOVAN ) 160 MG tablet, TAKE 1 TABLET BY MOUTH EVERY DAY, Disp: 90 tablet, Rfl: 3   No Known Allergies   Men's preventive visit. Patient Health Questionnaire (PHQ-2) is  Flowsheet Row Clinical Support from 03/11/2023 in [facility]  PHQ-2 Total Score 0     . Patient is on a *** diet. Marital status: Married. Relevant history for alcohol use is:  Social History   Substance and Sexual Activity  Alcohol Use Not Currently   Comment: ocassional   . Relevant history for tobacco use is:  Social History   Tobacco Use  Smoking Status Never  Smokeless Tobacco Never  .   Review of Systems  Eyes:  Negative for blurred vision.    There were no vitals filed for this visit. There is no height or weight on file to calculate BMI.  Wt Readings from Last 3 Encounters:  12/10/23 220 lb (99.8 kg)  11/18/23 210 lb (95.3 kg)  08/13/23 221 lb (100.2 kg)    Objective:  Physical Exam      Assessment And Plan:    Encounter for general adult medical examination w/o abnormal findings     No follow-ups on file. Patient was given opportunity to ask questions. Patient verbalized understanding of the plan and was able to repeat key elements of the plan. All questions were answered to their satisfaction.   Smiley Dung, MD  I, Smiley Dung, MD, have reviewed all documentation for this visit. The documentation on 03/08/24 for the exam, diagnosis, procedures, and orders are all accurate and complete.

## 2024-03-09 ENCOUNTER — Encounter: Admitting: Family Medicine

## 2024-03-09 DIAGNOSIS — Z961 Presence of intraocular lens: Secondary | ICD-10-CM | POA: Diagnosis not present

## 2024-03-09 DIAGNOSIS — H25811 Combined forms of age-related cataract, right eye: Secondary | ICD-10-CM | POA: Diagnosis not present

## 2024-03-09 DIAGNOSIS — H35033 Hypertensive retinopathy, bilateral: Secondary | ICD-10-CM | POA: Diagnosis not present

## 2024-03-09 DIAGNOSIS — E119 Type 2 diabetes mellitus without complications: Secondary | ICD-10-CM | POA: Diagnosis not present

## 2024-03-11 ENCOUNTER — Ambulatory Visit: Admitting: Family Medicine

## 2024-03-11 ENCOUNTER — Encounter: Payer: Self-pay | Admitting: Family Medicine

## 2024-03-11 VITALS — BP 130/80 | HR 85 | Temp 98.9°F | Ht 68.0 in | Wt 227.0 lb

## 2024-03-11 DIAGNOSIS — Z0000 Encounter for general adult medical examination without abnormal findings: Secondary | ICD-10-CM | POA: Diagnosis not present

## 2024-03-11 DIAGNOSIS — E1129 Type 2 diabetes mellitus with other diabetic kidney complication: Secondary | ICD-10-CM | POA: Diagnosis not present

## 2024-03-11 DIAGNOSIS — E1122 Type 2 diabetes mellitus with diabetic chronic kidney disease: Secondary | ICD-10-CM | POA: Diagnosis not present

## 2024-03-11 DIAGNOSIS — E6609 Other obesity due to excess calories: Secondary | ICD-10-CM

## 2024-03-11 DIAGNOSIS — N182 Chronic kidney disease, stage 2 (mild): Secondary | ICD-10-CM | POA: Diagnosis not present

## 2024-03-11 DIAGNOSIS — D571 Sickle-cell disease without crisis: Secondary | ICD-10-CM | POA: Diagnosis not present

## 2024-03-11 DIAGNOSIS — I129 Hypertensive chronic kidney disease with stage 1 through stage 4 chronic kidney disease, or unspecified chronic kidney disease: Secondary | ICD-10-CM | POA: Diagnosis not present

## 2024-03-11 DIAGNOSIS — E782 Mixed hyperlipidemia: Secondary | ICD-10-CM | POA: Diagnosis not present

## 2024-03-11 DIAGNOSIS — E66811 Obesity, class 1: Secondary | ICD-10-CM

## 2024-03-11 DIAGNOSIS — Z6834 Body mass index (BMI) 34.0-34.9, adult: Secondary | ICD-10-CM

## 2024-03-11 DIAGNOSIS — R351 Nocturia: Secondary | ICD-10-CM

## 2024-03-11 MED ORDER — AMLODIPINE BESYLATE 10 MG PO TABS: 10.0000 mg | ORAL_TABLET | Freq: Every day | ORAL | 2 refills | Status: AC

## 2024-03-11 MED ORDER — PRAVASTATIN SODIUM 20 MG PO TABS: 20.0000 mg | ORAL_TABLET | Freq: Every evening | ORAL | 2 refills | Status: AC

## 2024-03-11 MED ORDER — CARVEDILOL 6.25 MG PO TABS: 6.2500 mg | ORAL_TABLET | Freq: Two times a day (BID) | ORAL | 2 refills | Status: AC

## 2024-03-11 MED ORDER — TRIAMTERENE-HCTZ 37.5-25 MG PO TABS
1.0000 | ORAL_TABLET | Freq: Every day | ORAL | 2 refills | Status: AC
Start: 2024-03-11 — End: ?

## 2024-03-11 MED ORDER — METFORMIN HCL 500 MG PO TABS: 500.0000 mg | ORAL_TABLET | Freq: Two times a day (BID) | ORAL | 2 refills | Status: AC

## 2024-03-11 NOTE — Assessment & Plan Note (Signed)
 Chronic. No crisis. Goes to Sickle cell clinic at [facility]

## 2024-03-11 NOTE — Assessment & Plan Note (Signed)
Encouraged to keep BP well controlled and avoid use of NSAIDs

## 2024-03-11 NOTE — Assessment & Plan Note (Signed)
 He is encouraged to strive for BMI less than 30 to decrease cardiac risk. Advised to aim for at least 150 minutes of exercise per week.

## 2024-03-11 NOTE — Progress Notes (Signed)
 I,[name], CMA,acting as a Neurosurgeon for [employer], NP.,have documented all relevant documentation on the behalf of Melodie Spry, NP,as directed by  Melodie Spry, NP while in the presence of Melodie Spry, NP.  Subjective:   Patient ID: Nicolas [name] , male    DOB: 1976/05/06 , 48 y.o.   MRN: [id number]  Chief Complaint  Patient presents with   Annual Exam    HPI  Patient  is a 48 year old male who present  today for his annual  physical exam.  Patient has a diagnosis of diabetes mellitus , hypertension, hyperlipidemia and sickle cell disease. He has no specific concerns or complaints at this time.  He reports compliance with his medications, denies headaches, chest pain and shortness of breath.  Diabetes He presents for his follow-up diabetic visit. He has type 2 diabetes mellitus. There are no hypoglycemic associated symptoms. Pertinent negatives for diabetes include no blurred vision. There are no hypoglycemic complications. Diabetic complications include nephropathy. Risk factors for coronary artery disease include diabetes mellitus, dyslipidemia, hypertension, male sex, sedentary lifestyle and obesity. Current diabetic treatment includes oral agent (dual therapy). He is following a diabetic diet. He participates in exercise intermittently. His home blood glucose trend is fluctuating minimally. His breakfast blood glucose is taken between 8-9 am. His breakfast blood glucose range is generally 110-130 mg/dl. An ACE inhibitor/angiotensin II receptor blocker is being taken. He does not see a podiatrist.Eye exam is current.  Hypertension This is a chronic problem. The current episode started more than 1 year ago. The problem has been gradually improving since onset. The problem is controlled. Pertinent negatives include no blurred vision. Past treatments include ACE inhibitors, calcium  channel blockers and beta blockers. The current treatment provides moderate improvement. Compliance  problems include exercise.  Hypertensive end-organ damage includes kidney disease.     Past Medical History:  Diagnosis Date   Arthritis    arthritis- back & L hip   Depression    situational depression, pt. out of work    Diabetes mellitus (HCC)    HTN (hypertension) 05/10/2015   Osteoarthritis of left hip 06/07/2012   Sickle cell anemia (HCC)      Family History  Problem Relation Age of Onset   Diabetes Mother    Cancer - Other Father    Liver cancer Father    CAD Neg Hx      Current Outpatient Medications:    Cholecalciferol  (VITAMIN D3) 2000 units capsule, Take 2,000 Units by mouth daily., Disp: , Rfl: 5   folic acid  (FOLVITE ) 1 MG tablet, Take 1 tablet (1 mg total) by mouth daily., Disp: 30 tablet, Rfl: 11   glucose blood test strip, Use as instructed, Disp: 100 each, Rfl: 12   hydroxyurea (HYDREA) 500 MG capsule, Take 1,000 mg by mouth daily., Disp: , Rfl:    Lancets (ACCU-CHEK MULTICLIX) lancets, Use as instructed, Disp: 100 each, Rfl: 12   Multiple Vitamin (ONE-A-DAY MENS PO), Take 1 tablet by mouth daily with breakfast., Disp: , Rfl:    oxyCODONE -acetaminophen  (PERCOCET) 7.5-325 MG tablet, Take 1 tablet by mouth every 4 (four) hours as needed for severe pain (pain score 7-10)., Disp: 20 tablet, Rfl: 0   Semaglutide ,0.25 or 0.5MG /DOS, (OZEMPIC , 0.25 OR 0.5 MG/DOSE,) 2 MG/3ML SOPN, INJECT 0.5 MG INTO THE SKIN ONCE A WEEK., Disp: 9 mL, Rfl: 3   tiZANidine  (ZANAFLEX ) 4 MG tablet, TAKE 1 TABLET BY MOUTH EVERY DAY, Disp: 90 tablet, Rfl: 2   amLODipine  (NORVASC )  10 MG tablet, Take 1 tablet (10 mg total) by mouth daily., Disp: 90 tablet, Rfl: 2   carvedilol  (COREG ) 6.25 MG tablet, Take 1 tablet (6.25 mg total) by mouth 2 (two) times daily with a meal., Disp: 180 tablet, Rfl: 2   metFORMIN (GLUCOPHAGE) 500 MG tablet, Take 1 tablet (500 mg total) by mouth 2 (two) times daily with a meal., Disp: 180 tablet, Rfl: 2   pravastatin  (PRAVACHOL ) 20 MG tablet, Take 1 tablet (20 mg total) by  mouth every evening., Disp: 90 tablet, Rfl: 2   triamterene -hydrochlorothiazide  (MAXZIDE -25) 37.5-25 MG tablet, Take 1 tablet by mouth daily., Disp: 90 tablet, Rfl: 2   No Known Allergies    Flowsheet Row Office Visit from 03/11/2024 in [facility]  PHQ-2 Total Score 0  . Social History   Substance and Sexual Activity  Alcohol Use Not Currently   Comment: ocassional    Social History   Tobacco Use  Smoking Status Never  Smokeless Tobacco Never  .   Review of Systems  Constitutional: Negative.   HENT: Negative.    Eyes: Negative.  Negative for blurred vision.  Respiratory: Negative.    Cardiovascular: Negative.   Gastrointestinal: Negative.   Genitourinary: Negative.   Musculoskeletal: Negative.   Skin: Negative.   Neurological: Negative.   Hematological: Negative.   Psychiatric/Behavioral: Negative.       Today's Vitals   03/11/24 0948  BP: 130/80  Pulse: 85  Temp: 98.9 F (37.2 C)  TempSrc: Oral  Weight: 227 lb (103 kg)  Height: 5' 8 (1.727 m)  PainSc: 6   PainLoc: Back   Body mass index is 34.52 kg/m.  Wt Readings from Last 3 Encounters:  03/11/24 227 lb (103 kg)  12/10/23 220 lb (99.8 kg)  11/18/23 210 lb (95.3 kg)    Objective:  Physical Exam HENT:     Head: Normocephalic.   Cardiovascular:     Rate and Rhythm: Normal rate.   Neurological:     Mental Status: He is alert.         Assessment And Plan:    Encounter for general adult medical examination w/o abnormal findings  Sickle cell disease without crisis [facility]) Assessment & Plan: Chronic. No crisis. Goes to Sickle cell clinic at [facility]   Benign hypertension with CKD (chronic kidney disease), stage II Assessment & Plan: Encouraged to keep BP well controlled and avoid use of NSAIDs   Orders: -     CMP14+EGFR -     Microalbumin / creatinine urine ratio -     Lipid panel -     EKG 12-Lead -     Carvedilol ; Take 1 tablet (6.25 mg total) by mouth 2  (two) times daily with a meal.  Dispense: 180 tablet; Refill: 2 -     Triamterene -HCTZ; Take 1 tablet by mouth daily.  Dispense: 90 tablet; Refill: 2 -     amLODIPine  Besylate; Take 1 tablet (10 mg total) by mouth daily.  Dispense: 90 tablet; Refill: 2  Type 2 diabetes mellitus with stage 2 chronic kidney disease, without long-term current use of insulin  (HCC) -     Hemoglobin A1c -     metFORMIN HCl; Take 1 tablet (500 mg total) by mouth 2 (two) times daily with a meal.  Dispense: 180 tablet; Refill: 2  Nocturia -     PSA  Mixed hyperlipidemia -     Pravastatin  Sodium; Take 1 tablet (20 mg total) by mouth every  evening.  Dispense: 90 tablet; Refill: 2  Class 1 obesity due to excess calories with serious comorbidity and body mass index (BMI) of 34.0 to 34.9 in adult Assessment & Plan: He is encouraged to strive for BMI less than 30 to decrease cardiac risk. Advised to aim for at least 150 minutes of exercise per week.       Return for 1 year physical, 3 months diabetes. Patient was given opportunity to ask questions. Patient verbalized understanding of the plan and was able to repeat key elements of the plan. All questions were answered to their satisfaction.   I, Melodie Spry, NP, have reviewed all documentation for this visit. The documentation on 03/11/2024 for the exam, diagnosis, procedures, and orders are all accurate and complete.

## 2024-03-11 NOTE — Patient Instructions (Signed)
 Health Maintenance, Male  Adopting a healthy lifestyle and getting preventive care are important in promoting health and wellness. Ask your health care provider about:  The right schedule for you to have regular tests and exams.  Things you can do on your own to prevent diseases and keep yourself healthy.  What should I know about diet, weight, and exercise?  Eat a healthy diet    Eat a diet that includes plenty of vegetables, fruits, low-fat dairy products, and lean protein.  Do not eat a lot of foods that are high in solid fats, added sugars, or sodium.  Maintain a healthy weight  Body mass index (BMI) is a measurement that can be used to identify possible weight problems. It estimates body fat based on height and weight. Your health care provider can help determine your BMI and help you achieve or maintain a healthy weight.  Get regular exercise  Get regular exercise. This is one of the most important things you can do for your health. Most adults should:  Exercise for at least 150 minutes each week. The exercise should increase your heart rate and make you sweat (moderate-intensity exercise).  Do strengthening exercises at least twice a week. This is in addition to the moderate-intensity exercise.  Spend less time sitting. Even light physical activity can be beneficial.  Watch cholesterol and blood lipids  Have your blood tested for lipids and cholesterol at 50 years of age, then have this test every 5 years.  You may need to have your cholesterol levels checked more often if:  Your lipid or cholesterol levels are high.  You are older than 50 years of age.  You are at high risk for heart disease.  What should I know about cancer screening?  Many types of cancers can be detected early and may often be prevented. Depending on your health history and family history, you may need to have cancer screening at various ages. This may include screening for:  Colorectal cancer.  Prostate cancer.  Skin cancer.  Lung  cancer.  What should I know about heart disease, diabetes, and high blood pressure?  Blood pressure and heart disease  High blood pressure causes heart disease and increases the risk of stroke. This is more likely to develop in people who have high blood pressure readings or are overweight.  Talk with your health care provider about your target blood pressure readings.  Have your blood pressure checked:  Every 3-5 years if you are 9-95 years of age.  Every year if you are 85 years old or older.  If you are between the ages of 29 and 29 and are a current or former smoker, ask your health care provider if you should have a one-time screening for abdominal aortic aneurysm (AAA).  Diabetes  Have regular diabetes screenings. This checks your fasting blood sugar level. Have the screening done:  Once every three years after age 23 if you are at a normal weight and have a low risk for diabetes.  More often and at a younger age if you are overweight or have a high risk for diabetes.  What should I know about preventing infection?  Hepatitis B  If you have a higher risk for hepatitis B, you should be screened for this virus. Talk with your health care provider to find out if you are at risk for hepatitis B infection.  Hepatitis C  Blood testing is recommended for:  Everyone born from [birth year] through 1967.  Anyone  with known risk factors for hepatitis C.  Sexually transmitted infections (STIs)  You should be screened each year for STIs, including gonorrhea and chlamydia, if:  You are sexually active and are younger than 50 years of age.  You are older than 50 years of age and your health care provider tells you that you are at risk for this type of infection.  Your sexual activity has changed since you were last screened, and you are at increased risk for chlamydia or gonorrhea. Ask your health care provider if you are at risk.  Ask your health care provider about whether you are at high risk for HIV. Your health care provider  may recommend a prescription medicine to help prevent HIV infection. If you choose to take medicine to prevent HIV, you should first get tested for HIV. You should then be tested every 3 months for as long as you are taking the medicine.  Follow these instructions at home:  Alcohol use  Do not drink alcohol if your health care provider tells you not to drink.  If you drink alcohol:  Limit how much you have to 0-2 drinks a day.  Know how much alcohol is in your drink. In the [place] one drink equals one 12 oz bottle of beer (355 mL), one 5 oz glass of wine (148 mL), or one 1 oz glass of hard liquor (44 mL).  Lifestyle  Do not use any products that contain nicotine or tobacco. These products include cigarettes, chewing tobacco, and vaping devices, such as e-cigarettes. If you need help quitting, ask your health care provider.  Do not use street drugs.  Do not share needles.  Ask your health care provider for help if you need support or information about quitting drugs.  General instructions  Schedule regular health, dental, and eye exams.  Stay current with your vaccines.  Tell your health care provider if:  You often feel depressed.  You have ever been abused or do not feel safe at home.  Summary  Adopting a healthy lifestyle and getting preventive care are important in promoting health and wellness.  Follow your health care provider's instructions about healthy diet, exercising, and getting tested or screened for diseases.  Follow your health care provider's instructions on monitoring your cholesterol and blood pressure.  This information is not intended to replace advice given to you by your health care provider. Make sure you discuss any questions you have with your health care provider.  Document Revised: 02/04/2021 Document Reviewed: 02/04/2021  Elsevier Patient Education  2024 [employer].

## 2024-03-14 ENCOUNTER — Ambulatory Visit: Payer: Self-pay | Admitting: Internal Medicine

## 2024-03-14 LAB — CMP14+EGFR
ALT: 22 IU/L (ref 0–44)
AST: 35 IU/L (ref 0–40)
Albumin: 4.7 g/dL (ref 4.1–5.1)
Alkaline Phosphatase: 61 IU/L (ref 44–121)
BUN/Creatinine Ratio: 14 (ref 9–20)
BUN: 19 mg/dL (ref 6–24)
Bilirubin Total: 1.1 mg/dL (ref 0.0–1.2)
CO2: 27 mmol/L (ref 20–29)
Calcium: 9.7 mg/dL (ref 8.7–10.2)
Chloride: 100 mmol/L (ref 96–106)
Creatinine, Ser: 1.4 mg/dL — ABNORMAL HIGH (ref 0.76–1.27)
Globulin, Total: 2.6 g/dL (ref 1.5–4.5)
Glucose: 110 mg/dL — ABNORMAL HIGH (ref 70–99)
Potassium: 3.8 mmol/L (ref 3.5–5.2)
Sodium: 143 mmol/L (ref 134–144)
Total Protein: 7.3 g/dL (ref 6.0–8.5)
eGFR: 61 mL/min/{1.73_m2} (ref >59)

## 2024-03-14 LAB — LIPID PANEL
Chol/HDL Ratio: 2.6 ratio (ref 0.0–5.0)
Cholesterol, Total: 100 mg/dL (ref 100–199)
HDL: 38 mg/dL — ABNORMAL LOW (ref >39)
LDL Chol Calc (NIH): 33 mg/dL (ref 0–99)
Triglycerides: 177 mg/dL — ABNORMAL HIGH (ref 0–149)
VLDL Cholesterol Cal: 29 mg/dL (ref 5–40)

## 2024-03-14 LAB — HEMOGLOBIN A1C
Est. average glucose Bld gHb Est-mCnc: <74 mg/dL
Hgb A1c MFr Bld: <4.2 % — ABNORMAL LOW (ref 4.8–5.6)

## 2024-03-14 LAB — PSA: Prostate Specific Ag, Serum: 1.5 ng/mL (ref 0.0–4.0)

## 2024-03-14 LAB — MICROALBUMIN / CREATININE URINE RATIO
Creatinine, Urine: 193.6 mg/dL
Microalb/Creat Ratio: 207 mg/g{creat} — ABNORMAL HIGH (ref 0–29)
Microalbumin, Urine: 400.9 ug/mL

## 2024-03-16 ENCOUNTER — Ambulatory Visit: Payer: 59

## 2024-03-16 VITALS — BP 136/80 | HR 86 | Temp 98.6°F | Ht 68.0 in | Wt 230.2 lb

## 2024-03-16 DIAGNOSIS — Z23 Encounter for immunization: Secondary | ICD-10-CM | POA: Diagnosis not present

## 2024-03-16 DIAGNOSIS — Z0000 Encounter for general adult medical examination without abnormal findings: Secondary | ICD-10-CM | POA: Diagnosis not present

## 2024-03-16 NOTE — Progress Notes (Signed)
 Subjective:   Nicolas [name] is a 48 y.o. who presents for a Medicare Wellness preventive visit.  As a reminder, Annual Wellness Visits don't include a physical exam, and some assessments may be limited, especially if this visit is performed virtually. We may recommend an in-person follow-up visit with your provider if needed.  Visit Complete: In person    Persons Participating in Visit: Patient.  AWV Questionnaire: No: Patient Medicare AWV questionnaire was not completed prior to this visit.  Cardiac Risk Factors include: advanced age (>10men, >56 women);diabetes mellitus;hypertension;male gender     Objective:    Today's Vitals   03/16/24 1059 03/16/24 1100 03/16/24 1118  BP: (!) 148/80  136/80  Pulse: 86    Temp: 98.6 F (37 C)    TempSrc: Oral    SpO2: 96%    Weight: 230 lb 3.2 oz (104.4 kg)    Height: 5' 8 (1.727 m)    PainSc:  6     Body mass index is 35 kg/m.     03/16/2024   11:05 AM 11/19/2023    1:30 AM 07/30/2023    6:00 PM 07/29/2023    6:03 PM 07/27/2023   10:03 PM 03/11/2023   11:11 AM 02/26/2023    9:29 AM  Advanced Directives  Does Patient Have a Medical Advance Directive? No No No No No No No  Would patient like information on creating a medical advance directive? No - Patient declined No - Patient declined No - Patient declined  No - Patient declined  No - Patient declined    Current Medications (verified) Outpatient Encounter Medications as of 03/16/2024  Medication Sig   amLODipine  (NORVASC ) 10 MG tablet Take 1 tablet (10 mg total) by mouth daily.   carvedilol  (COREG ) 6.25 MG tablet Take 1 tablet (6.25 mg total) by mouth 2 (two) times daily with a meal.   Cholecalciferol  (VITAMIN D3) 2000 units capsule Take 2,000 Units by mouth daily.   folic acid  (FOLVITE ) 1 MG tablet Take 1 tablet (1 mg total) by mouth daily.   glucose blood test strip Use as instructed   hydroxyurea (HYDREA) 500 MG capsule Take 1,000 mg by mouth daily.   Lancets  (ACCU-CHEK MULTICLIX) lancets Use as instructed   metFORMIN (GLUCOPHAGE) 500 MG tablet Take 1 tablet (500 mg total) by mouth 2 (two) times daily with a meal.   Multiple Vitamin (ONE-A-DAY MENS PO) Take 1 tablet by mouth daily with breakfast.   oxyCODONE -acetaminophen  (PERCOCET) 7.5-325 MG tablet Take 1 tablet by mouth every 4 (four) hours as needed for severe pain (pain score 7-10).   pravastatin  (PRAVACHOL ) 20 MG tablet Take 1 tablet (20 mg total) by mouth every evening.   Semaglutide ,0.25 or 0.5MG /DOS, (OZEMPIC , 0.25 OR 0.5 MG/DOSE,) 2 MG/3ML SOPN INJECT 0.5 MG INTO THE SKIN ONCE A WEEK.   tiZANidine  (ZANAFLEX ) 4 MG tablet TAKE 1 TABLET BY MOUTH EVERY DAY   triamterene -hydrochlorothiazide  (MAXZIDE -25) 37.5-25 MG tablet Take 1 tablet by mouth daily.   No facility-administered encounter medications on file as of 03/16/2024.    Allergies (verified) Patient has no known allergies.   History: Past Medical History:  Diagnosis Date   Arthritis    arthritis- back & L hip   Depression    situational depression, pt. out of work    Diabetes mellitus (HCC)    HTN (hypertension) 05/10/2015   Osteoarthritis of left hip 06/07/2012   Sickle cell anemia [facility])    Past Surgical History:  Procedure Laterality Date  IR GENERIC HISTORICAL  09/26/2016   IR FLUORO GUIDE CV LINE RIGHT 09/26/2016 Erica Hau, MD [facility]   IR GENERIC HISTORICAL  09/26/2016   IR US  GUIDE VASC ACCESS RIGHT 09/26/2016 Erica Hau, MD [facility]   JOINT REPLACEMENT Right 2012   R hip   PHOTOCOAGULATION WITH LASER Right 02/26/2023   Procedure: PHOTOCOAGULATION WITH LASER;  Surgeon: Ronelle Coffee, MD;  Location: [facility] OR;  Service: Ophthalmology;  Laterality: Right;   TOTAL HIP ARTHROPLASTY  06/07/2012   Procedure: TOTAL HIP ARTHROPLASTY;  Surgeon: Neville Barbone, MD;  Location: [facility] OR;  Service: Orthopedics;  Laterality: Left;   VITRECTOMY 25 GAUGE WITH SCLERAL BUCKLE Left 02/26/2023   Procedure: VITRECTOMY 25 GAUGE  WITH SCLERAL BUCKLE;  Surgeon: Ronelle Coffee, MD;  [facility] OR;  Service: Ophthalmology;  Laterality: Left;   Family History  Problem Relation Age of Onset   Diabetes Mother    Cancer - Other Father    Liver cancer Father    CAD Neg Hx    Social History   Socioeconomic History   Marital status: Married    Spouse name: Afiwavi   Number of children: 2   Years of education: Not on file   Highest education level: Not on file  Occupational History   Occupation: Disabled  Tobacco Use   Smoking status: Never   Smokeless tobacco: Never  Vaping Use   Vaping status: Never Used  Substance and Sexual Activity   Alcohol use: Not Currently    Comment: ocassional    Drug use: Yes    Types: Oxycodone    Sexual activity: Yes  Other Topics Concern   Not on file  Social History Narrative   Not on file   Social Drivers of Health   Financial Resource Strain: Low Risk  (03/16/2024)   Overall Financial Resource Strain (CARDIA)    Difficulty of Paying Living Expenses: Not hard at all  Food Insecurity: No Food Insecurity (03/16/2024)   Hunger Vital Sign    Worried About Running Out of Food in the Last Year: Never true    Ran Out of Food in the Last Year: Never true  Transportation Needs: No Transportation Needs (03/16/2024)   PRAPARE - Administrator, Civil Service (Medical): No    Lack of Transportation (Non-Medical): No  Physical Activity: Sufficiently Active (03/16/2024)   Exercise Vital Sign    Days of Exercise per Week: 4 days    Minutes of Exercise per Session: 60 min  Stress: No Stress Concern Present (03/16/2024)   [employer] of Occupational Health - Occupational Stress Questionnaire    Feeling of Stress: Not at all  Social Connections: Moderately Integrated (03/16/2024)   Social Connection and Isolation Panel    Frequency of Communication with Friends and Family: Three times a week    Frequency of Social Gatherings with Friends and Family: Once a week     Attends Religious Services: More than 4 times per year    Active Member of [employer] or Organizations: No    Attends Engineer, structural: Never    Marital Status: Married    Tobacco Counseling Counseling given: Not Answered    Clinical Intake:  Pre-visit preparation completed: Yes  Pain : 0-10 Pain Score: 6  Pain Type: Chronic pain Pain Location: Back Pain Orientation: Lower Pain Descriptors / Indicators: Aching Pain Onset: More than a month ago Pain Frequency: Constant     Nutritional Status: BMI > 30  Obese Nutritional Risks:  None Diabetes: Yes CBG done?: No Did pt. bring in CBG monitor from home?: No  Lab Results  Component Value Date   HGBA1C <4.2 (L) 03/11/2024   HGBA1C 4.2 (L) 06/23/2023   HGBA1C <4.2 (L) 02/16/2023     How often do you need to have someone help you when you read instructions, pamphlets, or other written materials from your doctor or pharmacy?: 1 - Never  Interpreter Needed?: No  Information entered by :: NAllen LPN   Activities of Daily Living     03/16/2024   11:02 AM 11/19/2023    1:30 AM  In your present state of health, do you have any difficulty performing the following activities:  Hearing? 0 0  Vision? 0 0  Difficulty concentrating or making decisions? 0 0  Walking or climbing stairs? 0   Dressing or bathing? 0   Doing errands, shopping? 0 0  Preparing Food and eating ? N   Using the Toilet? N   In the past six months, have you accidently leaked urine? N   Do you have problems with loss of bowel control? N   Managing your Medications? N   Managing your Finances? N   Housekeeping or managing your Housekeeping? N     Patient Care Team: Cleave Curling, MD as PCP - General (Internal Medicine) Lorena Rolling, MD as Consulting Physician (Ophthalmology)  I have updated your Care Teams any recent Medical Services you may have received from other providers in the past year.     Assessment:   This is a routine  wellness examination for [facility].  Hearing/Vision screen Hearing Screening - Comments:: Denies hearing issues Vision Screening - Comments:: Regular eye exams, Dr. Karyl Paget, Dr. Candi Chafe   Goals Addressed             This Visit's Progress    Patient Stated       03/16/2024, denies goals       Depression Screen     03/16/2024   11:08 AM 03/11/2024    9:52 AM 03/11/2023   11:11 AM 02/16/2023   11:03 AM 10/23/2022    4:20 PM 02/26/2022    2:45 PM 02/22/2022    3:17 PM  PHQ 2/9 Scores  PHQ - 2 Score 0 0 0 0 0 0 0  PHQ- 9 Score 0 0         Fall Risk     03/16/2024   11:07 AM 03/11/2024    9:52 AM 03/11/2023   11:11 AM 02/16/2023   11:02 AM 10/23/2022    4:20 PM  Fall Risk   Falls in the past year? 0 0 0 0 0  Number falls in past yr: 0 0 0 0 0  Injury with Fall? 0 0 0 0 0  Risk for fall due to : Medication side effect History of fall(s) Medication side effect No Fall Risks No Fall Risks  Follow up Falls evaluation completed;Falls prevention discussed Falls evaluation completed Falls prevention discussed;Education provided;Falls evaluation completed Falls evaluation completed Falls evaluation completed    MEDICARE RISK AT HOME:  Medicare Risk at Home Any stairs in or around the home?: No If so, are there any without handrails?: No Home free of loose throw rugs in walkways, pet beds, electrical cords, etc?: Yes Adequate lighting in your home to reduce risk of falls?: Yes Life alert?: No Use of a cane, walker or w/c?: No Grab bars in the bathroom?: No Shower chair or bench in shower?: No Elevated toilet  seat or a handicapped toilet?: No  TIMED UP AND GO:  Was the test performed?  Yes  Length of time to ambulate 10 feet: 5 sec Gait steady and fast without use of assistive device  Cognitive Function: 6CIT completed        03/16/2024   11:08 AM 03/11/2023   11:13 AM 02/26/2022    2:47 PM 02/20/2021    3:03 PM 02/08/2020   11:52 AM  6CIT Screen  What Year? 0 points 0 points 0  points 0 points 0 points  What month? 0 points 0 points 0 points 0 points 0 points  What time? 0 points 0 points 0 points 0 points 0 points  Count back from 20 0 points 0 points 0 points 0 points 0 points  Months in reverse 0 points 0 points 0 points 0 points 0 points  Repeat phrase 4 points 0 points 4 points 4 points 2 points  Total Score 4 points 0 points 4 points 4 points 2 points    Immunizations Immunization History  Administered Date(s) Administered   HIB (PRP-T) 02/08/2015   Hepatitis B, ADULT 08/20/2015, 09/17/2015, 02/18/2016   Hepatitis B, PED/ADOLESCENT 08/20/2015, 09/17/2015, 02/18/2016   Influenza, Mdck, Trivalent,PF 6+ MOS(egg free) 05/25/2023   Influenza,inj,Quad PF,6+ Mos 05/25/2023   Meningococcal B Recombinant 08/06/2020, 09/12/2020   Meningococcal B, OMV 08/06/2020, 09/12/2020   Meningococcal Conjugate 11/09/2014, 02/20/2020, 02/20/2020   Moderna Covid-19 Fall Seasonal Vaccine 30yrs & older 03/02/2024   PFIZER(Purple Top)SARS-COV-2 Vaccination 01/07/2020, 01/30/2020, 10/16/2020   Pneumococcal Conjugate-13 11/09/2014   Pneumococcal Polysaccharide-23 02/08/2015, 02/20/2020   Tdap 02/16/2023    Screening Tests Health Maintenance  Topic Date Due   Zoster Vaccines- Shingrix (1 of 2) Never done   Meningococcal B Vaccine (2 of 4 - Increased Risk Bexsero 3-dose series) 10/10/2020   FOOT EXAM  02/16/2024   OPHTHALMOLOGY EXAM  02/19/2024   INFLUENZA VACCINE  04/29/2024   COVID-19 Vaccine (5 - Pfizer risk 2024-25 season) 09/01/2024   HEMOGLOBIN A1C  09/10/2024   Pneumococcal Vaccine 68-36 Years old (4 of 4 - PCV20 or PCV21) 02/19/2025   Diabetic kidney evaluation - eGFR measurement  03/11/2025   Diabetic kidney evaluation - Urine ACR  03/11/2025   Medicare Annual Wellness (AWV)  03/16/2025   Colonoscopy  01/22/2032   DTaP/Tdap/Td (2 - Td or Tdap) 02/15/2033   Hepatitis C Screening  Completed   HIV Screening  Completed   HPV VACCINES  Aged Out    Health  Maintenance  Health Maintenance Due  Topic Date Due   Zoster Vaccines- Shingrix (1 of 2) Never done   Meningococcal B Vaccine (2 of 4 - Increased Risk Bexsero 3-dose series) 10/10/2020   FOOT EXAM  02/16/2024   OPHTHALMOLOGY EXAM  02/19/2024   Health Maintenance Items Addressed: Shingrix vaccine given  Additional Screening:  Vision Screening: Recommended annual ophthalmology exams for early detection of glaucoma and other disorders of the eye. Would you like a referral to an eye doctor? No    Dental Screening: Recommended annual dental exams for proper oral hygiene  Community Resource Referral / Chronic Care Management: CRR required this visit?  No   CCM required this visit?  No   Plan:    I have personally reviewed and noted the following in the patient's chart:   Medical and social history Use of alcohol, tobacco or illicit drugs  Current medications and supplements including opioid prescriptions. Patient is currently taking opioid prescriptions. Information provided to patient regarding non-opioid  alternatives. Patient advised to discuss non-opioid treatment plan with their provider. Functional ability and status Nutritional status Physical activity Advanced directives List of other physicians Hospitalizations, surgeries, and ER visits in previous 12 months Vitals Screenings to include cognitive, depression, and falls Referrals and appointments  In addition, I have reviewed and discussed with patient certain preventive protocols, quality metrics, and best practice recommendations. A written personalized care plan for preventive services as well as general preventive health recommendations were provided to patient.   Areatha Beecham, LPN   1/61/0960   After Visit Summary: (In Person-Printed) AVS printed and given to the patient  Notes: Nothing significant to report at this time.

## 2024-03-16 NOTE — Patient Instructions (Addendum)
 Mr. Vasey , Thank you for taking time out of your busy schedule to complete your Annual Wellness Visit with me. I enjoyed our conversation and look forward to speaking with you again next year. I, as well as your care team,  appreciate your ongoing commitment to your health goals. Please review the following plan we discussed and let me know if I can assist you in the future. Your Game plan/ To Do List    Referrals: If you haven't heard from the office you've been referred to, please reach out to them at the phone provided.  N/a Follow up Visits: Next Medicare AWV with our clinical staff: office will schedule   Have you seen your provider in the last 6 months (3 months if uncontrolled diabetes)? Yes Next Office Visit with your provider: 06/14/2024 at 3:00  Clinician Recommendations:  Aim for 30 minutes of exercise or brisk walking, 6-8 glasses of water , and 5 servings of fruits and vegetables each day.       This is a list of the screening recommended for you and due dates:  Health Maintenance  Topic Date Due   Meningitis B Vaccine (2 of 4 - Increased Risk Bexsero 3-dose series) 10/10/2020   Complete foot exam   02/16/2024   Eye exam for diabetics  02/19/2024   Flu Shot  04/29/2024   Zoster (Shingles) Vaccine (2 of 2) 05/11/2024   COVID-19 Vaccine (5 - Pfizer risk 2024-25 season) 09/01/2024   Hemoglobin A1C  09/10/2024   Pneumococcal Vaccination (4 of 4 - PCV20 or PCV21) 02/19/2025   Yearly kidney function blood test for diabetes  03/11/2025   Yearly kidney health urinalysis for diabetes  03/11/2025   Medicare Annual Wellness Visit  03/16/2025   Colon Cancer Screening  01/22/2032   DTaP/Tdap/Td vaccine (2 - Td or Tdap) 02/15/2033   Hepatitis C Screening  Completed   HIV Screening  Completed   HPV Vaccine  Aged Out    Advanced directives: (Declined) Advance directive discussed with you today. Even though you declined this today, please call our office should you change your mind,  and we can give you the proper paperwork for you to fill out. Advance Care Planning is important because it:  [x]  Makes sure you receive the medical care that is consistent with your values, goals, and preferences  [x]  It provides guidance to your family and loved ones and reduces their decisional burden about whether or not they are making the right decisions based on your wishes.  Follow the link provided in your after visit summary or read over the paperwork we have mailed to you to help you started getting your Advance Directives in place. If you need assistance in completing these, please reach out to us  so that we can help you!  See attachments for Preventive Care and Fall Prevention Tips.

## 2024-03-17 LAB — SPECIMEN STATUS REPORT

## 2024-03-17 LAB — FRUCTOSAMINE: Fructosamine: 270 umol/L (ref 0–285)

## 2024-04-05 ENCOUNTER — Encounter: Payer: Self-pay | Admitting: Internal Medicine

## 2024-04-08 NOTE — Progress Notes (Shared)
 [facility] - Clinic Note  04/12/2024   CHIEF COMPLAINT Patient presents for No chief complaint on file.  HISTORY OF PRESENT ILLNESS: Nicolas [name] is a 48 y.o. male who presents to the clinic today for:   Pt states he is off all drops   Referring physician: Jarold Medici, MD [address] [place],  [place] [address]  HISTORICAL INFORMATION:  Selected notes from the MEDICAL RECORD NUMBER Referred by Dr. Jacques for TRD OS LEE:  Ocular Hx- PMH-   CURRENT MEDICATIONS: No current outpatient medications on file. (Ophthalmic Drugs)   No current facility-administered medications for this visit. (Ophthalmic Drugs)   Current Outpatient Medications (Other)  Medication Sig   amLODipine  (NORVASC ) 10 MG tablet Take 1 tablet (10 mg total) by mouth daily.   carvedilol  (COREG ) 6.25 MG tablet Take 1 tablet (6.25 mg total) by mouth 2 (two) times daily with a meal.   Cholecalciferol  (VITAMIN D3) 2000 units capsule Take 2,000 Units by mouth daily.   folic acid  (FOLVITE ) 1 MG tablet Take 1 tablet (1 mg total) by mouth daily.   glucose blood test strip Use as instructed   hydroxyurea (HYDREA) 500 MG capsule Take 1,000 mg by mouth daily.   Lancets (ACCU-CHEK MULTICLIX) lancets Use as instructed   metFORMIN  (GLUCOPHAGE ) 500 MG tablet Take 1 tablet (500 mg total) by mouth 2 (two) times daily with a meal.   Multiple Vitamin (ONE-A-DAY MENS PO) Take 1 tablet by mouth daily with breakfast.   oxyCODONE -acetaminophen  (PERCOCET) 7.5-325 MG tablet Take 1 tablet by mouth every 4 (four) hours as needed for severe pain (pain score 7-10).   pravastatin  (PRAVACHOL ) 20 MG tablet Take 1 tablet (20 mg total) by mouth every evening.   Semaglutide ,0.25 or 0.5MG /DOS, (OZEMPIC , 0.25 OR 0.5 MG/DOSE,) 2 MG/3ML SOPN INJECT 0.5 MG INTO THE SKIN ONCE A WEEK.   tiZANidine  (ZANAFLEX ) 4 MG tablet TAKE 1 TABLET BY MOUTH EVERY DAY   triamterene -hydrochlorothiazide  (MAXZIDE -25) 37.5-25 MG  tablet Take 1 tablet by mouth daily.   No current facility-administered medications for this visit. (Other)   REVIEW OF SYSTEMS:     ALLERGIES No Known Allergies PAST MEDICAL HISTORY Past Medical History:  Diagnosis Date   Arthritis    arthritis- back & L hip   Depression    situational depression, pt. out of work    Diabetes mellitus (HCC)    HTN (hypertension) 05/10/2015   Osteoarthritis of left hip 06/07/2012   Sickle cell anemia (HCC)    Past Surgical History:  Procedure Laterality Date   IR GENERIC HISTORICAL  09/26/2016   IR FLUORO GUIDE CV LINE RIGHT 09/26/2016 Marcey Moan, MD [facility]   IR GENERIC HISTORICAL  09/26/2016   IR US  GUIDE VASC ACCESS RIGHT 09/26/2016 Marcey Moan, MD [facility]   JOINT REPLACEMENT Right 2012   R hip   PHOTOCOAGULATION WITH LASER Right 02/26/2023   Procedure: PHOTOCOAGULATION WITH LASER;  Surgeon: Valdemar Rogue, MD;  Location: [facility] OR;  Service: Ophthalmology;  Laterality: Right;   TOTAL HIP ARTHROPLASTY  06/07/2012   Procedure: TOTAL HIP ARTHROPLASTY;  Surgeon: Fonda SHAUNNA Olmsted, MD;  Location: [facility] OR;  Service: Orthopedics;  Laterality: Left;   VITRECTOMY 25 GAUGE WITH SCLERAL BUCKLE Left 02/26/2023   Procedure: VITRECTOMY 25 GAUGE WITH SCLERAL BUCKLE;  Surgeon: Valdemar Rogue, MD;  Location: [facility] OR;  Service: Ophthalmology;  Laterality: Left;   FAMILY HISTORY Family History  Problem Relation Age of Onset   Diabetes Mother  Cancer - Other Father    Liver cancer Father    CAD Neg Hx    SOCIAL HISTORY Social History   Tobacco Use   Smoking status: Never   Smokeless tobacco: Never  Vaping Use   Vaping status: Never Used  Substance Use Topics   Alcohol use: Not Currently    Comment: ocassional    Drug use: Yes    Types: Oxycodone        OPHTHALMIC EXAM:  Not recorded    IMAGING AND PROCEDURES  Imaging and Procedures for 04/12/2024         ASSESSMENT/PLAN: No diagnosis found.  Retinal detachment, OS - at  presentation, pt reported ~1 month history of decreased vision OS - exam showed near total tractional detachment w/ ?rhegmatogenous component -- detachment greatest in nasal and inferior quadrants - focal areas of tractional fibrosis nasal and inferotemporal periphery w/ associated neovascularization  - retinal hole at 0800 - pt with history of sickle cell disease ([place]) -- retinopathy likely etiology of traction and fibrosis - s/p PPV/PFO/EL/FAX/14% C3F8 OS, 05.30.2024             - doing well - retina attached and in good position -- good buckle height and laser around breaks             - IOP 17  - BCVA OS - 20/70 improved from 20/80 -- s/p CEIOL OS w/ Dr. Octavia on 08.16.24  - off all drops now           - f/u 6-9 months DFE, OCT, FA (transit OS)  2,3. 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 Hypertensive retinopathy OU - discussed importance of tight BP control - monitor  6-8. Diabetes mellitus, type 2 without retinopathy  - A1c 4.2 on 09.24.24  - FA (05.23.24) with no significant MA OU - The incidence, risk factors for progression, natural history and treatment options for diabetic retinopathy  were discussed with patient.   - The need for close monitoring of blood glucose, blood pressure, and serum lipids, avoiding cigarette or any type of tobacco, and the need for long term follow up was also discussed with patient. - f/u in 1 year, sooner prn  9. Mixed Cataract OD - The symptoms of cataract, surgical options, and treatments and risks were discussed with patient. - discussed diagnosis and progression - under the expert management of Dr. Octavia  10. Pseudophakia OS  - s/p CE/IOL OS (08.16.24, Dr. Octavia)  - IOL in good  position, doing well  - monitor  Ophthalmic Meds Ordered this visit:  No orders of the defined types were placed in this encounter.    No follow-ups on file.  There are no Patient Instructions on file for this visit.  This document serves as a record of services personally performed by Redell JUDITHANN Hans, MD, PhD. It was created on their behalf by Auston Muzzy, COMT. The creation of this record is the provider's dictation and/or activities during the visit.  Electronically signed by: Auston Muzzy, COMT 04/08/24 9:48 AM    Redell JUDITHANN Hans, M.D., Ph.D. Diseases & Surgery of the Retina and Vitreous [facility] 02/26/2024      Abbreviations: M myopia (nearsighted); A astigmatism; H hyperopia (farsighted); P presbyopia; Mrx spectacle prescription;  CTL contact lenses; OD right eye; OS left eye; OU both eyes  XT exotropia; ET esotropia; PEK punctate epithelial keratitis; PEE punctate epithelial erosions; DES dry eye syndrome; MGD meibomian gland dysfunction; ATs artificial tears; PFAT's preservative free artificial tears; NSC nuclear sclerotic cataract; PSC posterior subcapsular cataract; ERM epi-retinal membrane; PVD posterior vitreous detachment; RD retinal detachment; DM diabetes mellitus; DR diabetic retinopathy; NPDR non-proliferative diabetic retinopathy; PDR proliferative diabetic retinopathy; CSME clinically significant macular edema; DME diabetic macular edema; dbh dot blot hemorrhages; CWS cotton wool spot; POAG primary open angle glaucoma; C/D cup-to-disc ratio; HVF humphrey visual field; GVF goldmann visual field; OCT optical coherence tomography; IOP intraocular pressure; BRVO Branch retinal vein occlusion; CRVO central retinal vein occlusion; CRAO central retinal artery occlusion; BRAO branch retinal artery occlusion; RT retinal tear; SB scleral buckle; PPV pars plana vitrectomy; VH Vitreous hemorrhage; PRP panretinal laser photocoagulation; IVK intravitreal  kenalog ; VMT vitreomacular traction; MH Macular hole;  NVD neovascularization of the disc; NVE neovascularization elsewhere; AREDS age related eye disease study; ARMD age related macular degeneration; POAG primary open angle glaucoma; EBMD epithelial/anterior basement membrane dystrophy; ACIOL anterior chamber intraocular lens; IOL intraocular lens; PCIOL posterior chamber intraocular lens; Phaco/IOL phacoemulsification with intraocular lens placement; PRK photorefractive keratectomy; LASIK laser assisted in situ keratomileusis; HTN hypertension; DM diabetes mellitus; COPD chronic obstructive pulmonary disease

## 2024-04-12 ENCOUNTER — Encounter (INDEPENDENT_AMBULATORY_CARE_PROVIDER_SITE_OTHER): Payer: 59 | Admitting: Ophthalmology

## 2024-04-12 DIAGNOSIS — I10 Essential (primary) hypertension: Secondary | ICD-10-CM

## 2024-04-12 DIAGNOSIS — H25811 Combined forms of age-related cataract, right eye: Secondary | ICD-10-CM

## 2024-04-12 DIAGNOSIS — H3322 Serous retinal detachment, left eye: Secondary | ICD-10-CM

## 2024-04-12 DIAGNOSIS — D571 Sickle-cell disease without crisis: Secondary | ICD-10-CM

## 2024-04-12 DIAGNOSIS — H35033 Hypertensive retinopathy, bilateral: Secondary | ICD-10-CM

## 2024-04-12 DIAGNOSIS — Z7984 Long term (current) use of oral hypoglycemic drugs: Secondary | ICD-10-CM

## 2024-04-12 DIAGNOSIS — Z961 Presence of intraocular lens: Secondary | ICD-10-CM

## 2024-04-12 DIAGNOSIS — Z7985 Long-term (current) use of injectable non-insulin antidiabetic drugs: Secondary | ICD-10-CM

## 2024-04-12 DIAGNOSIS — E119 Type 2 diabetes mellitus without complications: Secondary | ICD-10-CM

## 2024-05-12 DIAGNOSIS — E559 Vitamin D deficiency, unspecified: Secondary | ICD-10-CM | POA: Diagnosis not present

## 2024-05-12 DIAGNOSIS — G8929 Other chronic pain: Secondary | ICD-10-CM | POA: Diagnosis not present

## 2024-05-12 DIAGNOSIS — D730 Hyposplenism: Secondary | ICD-10-CM | POA: Diagnosis not present

## 2024-05-12 NOTE — Progress Notes (Signed)
 Hematology and Oncology Follow Up Visit   Name: Nicolas [name] Date: 05/12/2024 MRN: [id number]   Problem List:  Patient Active Problem List  Diagnosis  . Low back pain  . Sickle cell disease, type [place] (HCC)  . Chronic pain  . Vitamin D deficiency  . Avascular necrosis (HCC)  . Type 2 diabetes mellitus (HCC)  . Essential hypertension  . Obesity  . Proteinuria  . Bilateral shoulder pain  . Central obesity  . DKA, type 2 (HCC)  . Hypertensive heart disease  . History of malaria     Current Status: I had the pleasure of seeing Nicolas [name] in clinic today for continued follow-up of SCD. Has been doing well with addition of hydrea. Has not noticed a significant change, however feels the winter will be the true test. He has had no ED visits or admissions. No other changes or concerns since our last visit. Feels relatively stable. Eyes continue to improve.  Denies any fevers, chest pain, palpitations, dyspnea, abdominal pain, headaches.      Specialty Comments: HB ELECTRO INTERPRETIVE - STATUS: Final  Perform Date: 2 Nov15 16:26  HB-A NOT DETECTED >95 %  HB-A2 4.2 (H) 1.5 - 3.0 %  HB-F 0.6 0.0 - 3.0 %  HB-S 50.7 (H) 0 %  HB-C 44.5 (H) 0 %  HB Interpretation By: TINA JAMA ARISTA, M.D.  HB Comment: Findings consistent with hemoglobin S/C disease   Primary Hematologist: Bernardino Ill, MD  Diagnosis: Sickle Cell Disease, type-[place]. Patients with type-[place] have an increased risk of thrombosis.   Baseline Hgb: 12.5 g/dl  Baseline WBC: 88,999  Baseline reticulocyte count: 9%  Controlled substance agreement signed 02/15/2018.    Allergies:  No Known Allergies   Current Medications:  Current Outpatient Medications:  .  amLODIPine  (NORVASC ) 10 mg tablet, , Disp: , Rfl: 3 .  carvediloL  (COREG ) 6.25 mg tablet, Take 6.25 mg by mouth 2 (two) times a day. Indications: high blood pressure, Disp: , Rfl:  .  cholecalciferol  (VITAMIN D3) 2,000 unit cap capsule, Take 1 each  (2,000 Units total) by mouth daily., Disp: 90 capsule, Rfl: 3 .  clobetasoL (TEMOVATE) 0.05 % scalp solution, , Disp: , Rfl:  .  diclofenac  sodium (VOLTAREN ) 1 % gel, Apply  topically., Disp: , Rfl:  .  doxycycline  (VIBRAMYCIN ) 50 mg cap, Take 50 mg by mouth Once Daily., Disp: , Rfl:  .  ferrous sulfate 325 mg (65 mg iron) EC tablet, Take 325 mg by mouth every other day., Disp: 30 tablet, Rfl: 2 .  fluocinonide (LIDEX) 0.05 % solution, Apply 0.05 Pump topically 2 (two) times a day., Disp: , Rfl:  .  folic acid  (FOLVITE ) 1 mg tablet, Take 1 tablet (1 mg total) by mouth daily., Disp: 90 tablet, Rfl: 3 .  glucose blood (Precision Q-I-D Test) test strip, Use as instructed, Disp: , Rfl:  .  glucose monitoring kit kit, Glucometer- ACCUCHECK BRAND, Disp: , Rfl:  .  hydroxyUREA (HYDREA) 500 mg capsule, Take 2 capsules (1,000 mg total) by mouth daily. Take at the same time each day., Disp: 60 capsule, Rfl: 2 .  Lancets misc, Use as instructed, Disp: , Rfl:  .  lisinopriL  (PRINIVIL ) 10 mg tablet, Take 1 tablet by mouth daily., Disp: , Rfl:  .  meloxicam (MOBIC) 15 mg tablet, Take  by mouth., Disp: , Rfl:  .  metFORMIN  (GLUCOPHAGE ) 500 mg tablet, Take 500 mg by mouth 2 (two) times a day., Disp: , Rfl:  .  ofloxacin (OCUFLOX) 0.3 % ophthalmic solution, INSTILL 1 DROP INTO LEFT EYE FOUR TIMES A DAY STARTING 1 DAY BEFORE SURGERY, Disp: , Rfl:  .  olopatadine (PATADAY) 0.2 % ophthalmic solution, Administer 0.2 drops into each eyes 2 (two) times a day., Disp: , Rfl:  .  orphenadrine  (NORFLEX ) 100 mg tablet, Take  by mouth., Disp: , Rfl:  .  oxyCODONE -acetaminophen  (PERCOCET) 7.5-325 mg per tablet, Take 1 tablet by mouth every 8 (eight) hours as needed (pain)., Disp: 90 tablet, Rfl: 0 .  pravastatin  (PRAVACHOL ) 20 mg tablet, Take 20 mg by mouth Once Daily., Disp: , Rfl:  .  semaglutide  (Ozempic ) 0.25 mg or 0.5 mg(2 mg/1.5 mL) pnij, Inject 0.25 mg under the skin once a week., Disp: , Rfl:  .  sodium,potassium,mag  sulfates (SUPREP BOWEL PREP KIT) 17.5-3.13-1.6 gram solr, USE AS DIRECTED, Disp: , Rfl:  .  tiZANidine  (ZANAFLEX ) 4 mg tablet, Take 1 tablet by mouth daily., Disp: , Rfl:  .  triamterene -hydroCHLOROthiazide  (MAXZIDE -25) 37.5-25 mg per tablet, , Disp: , Rfl:  .  valsartan  (DIOVAN ) 160 mg tablet, Take 160 mg by mouth., Disp: , Rfl:    Immunizations:  Immunization History  Administered Date(s) Administered  . Hep B, Adolescent or Pediatric 08/20/2015, 09/17/2015, 02/18/2016  . Hib (PRP-T) 02/08/2015  . Meningococcal B (BEXSERO) 10Y+ 08/06/2020, 09/12/2020  . Meningococcal MCV4P 11/09/2014, 02/20/2020  . Pfizer SARS-CoV-2 Primary Series 12+ yrs 01/07/2020, 01/30/2020  . Pneumococcal Conjugate 13-Valent 11/09/2014  . Pneumococcal Polysaccharide Vaccine, 23 Valent (PNEUMOVAX-23) 2Y+ 02/08/2015, 02/20/2020     At a minimum, adult patients with SCD should complete as a primary series (per CDC Adult Immunization Schedule for patients with additional risk factor of functional asplenia; updated March 26, 2023)  Meningococcal ACWY  -Menveo or MenQuadfi - 2 doses at least 8 weeks apart; then re-vaccinate every 5 years  Meningococcal B -Bexsero - 2 doses at least 4 weeks apart OR -Trumenba - 3 doses at month 0, 1-2, and 6 THEN Booster 1 year later and revaccinate every 2-3 years  Haemophilus Influenzae type B (HIB) -1 dose if not previously received  Pneumococcal -if no pneumococcal vaccines received, administer PCV20 x1 -if patient has received PPSV23 or PCV13 previously, administer PCV20 x1 at least 1 year after PPSV23/PCV13 -if patient has received a single dose of PPSV23 and PCV13, then administer PCV20 at least 5 years after PPSV23/PCV13 -if patient has received a two doses of PPSV23 and one dose of PCV13, then administer PCV20 at least 5 years after PPSV23/PCV13 OR patient may be done with series  We also suggest: Immunization for Hepatitis B (Twinrix at 0, 1 and 6 month intervals. For  patients receiving regular blood transfusions, have history of HIV, current or recent drug use, or incarcerated)   Tdap (booster every 10 years, and goal of all pregnant woman) - If no history of primary vaccination series for tetanus, diphtheria, or pertussis: At least 1 dose Tdap followed by 1 dose Td or Tdap at least 4 weeks later, another dose Td or Tdap 6-12 months later, and booster every 10 years. Also 1 dose Tdap during each pregnancy.       BP 159/86 (BP Location: Right arm, Patient Position: Sitting)   Pulse 90   Temp 97 F (36.1 C) (Temporal)   Resp 16   Ht 1.778 m (5' 10)   Wt 105 kg (230 lb 9.6 oz)   SpO2 99%   BMI 33.09 kg/m  Physical Exam Constitutional:  General: He is not in acute distress.    Appearance: Normal appearance.   Eyes:     General: No scleral icterus.   Cardiovascular:     Rate and Rhythm: Normal rate and regular rhythm.     Heart sounds: Normal heart sounds. No murmur heard. Pulmonary:     Breath sounds: Normal breath sounds. No wheezing.  Abdominal:     General: Bowel sounds are normal.     Palpations: Abdomen is soft.     Tenderness: There is no abdominal tenderness.   Musculoskeletal:     Right lower leg: No edema.     Left lower leg: No edema.   Neurological:     Mental Status: He is alert.         Laboratory Data:  Recent Results (from the past 24 hours)  Reticulocyte Count   Collection Time: 05/12/24  2:58 PM  Result Value Ref Range   Reticulocyte Absolute 84.3 (H) 25.0 - 75.0 10*3/uL   Reticulocyte % 4.0 (H) 0.5 - 2.2 %  Comprehensive Metabolic Panel   Collection Time: 05/12/24  2:58 PM  Result Value Ref Range   Sodium 142 136 - 145 mmol/L   Potassium 3.4 3.4 - 4.5 mmol/L   Chloride 101 98 - 107 mmol/L   CO2 35 (H) 21 - 31 mmol/L   Anion Gap 6 6 - 14 mmol/L   Glucose, Random 126 (H) 70 - 99 mg/dL   Blood Urea Nitrogen (BUN) 20 7 - 25 mg/dL   Creatinine 8.63 (H) 9.29 - 1.30 mg/dL   eGFR 63 >40 fO/fpw/8.26f7    Albumin  4.9 3.5 - 5.7 g/dL   Total Protein 7.7 6.4 - 8.9 g/dL   Bilirubin, Total 0.8 0.3 - 1.0 mg/dL   Alkaline Phosphatase (ALP) 50 34 - 104 U/L   Aspartate Aminotransferase (AST) 31 13 - 39 U/L   Alanine Aminotransferase (ALT) 24 7 - 52 U/L   Calcium  10.3 8.6 - 10.3 mg/dL   BUN/Creatinine Ratio 14.7 10.0 - 20.0  Lactate Dehydrogenase (LDH)   Collection Time: 05/12/24  2:58 PM  Result Value Ref Range   Lactate Dehydrogenase (LDH) 325 (H) 140 - 271 U/L      Impression and Plan:  1. Sickle cell disease, type-[place] -  - last dose Oxbryta 1,500 mg was in October 2024.  - Felt a bit less controlled a few weeks following, and feels the crisis he had in February 2025 was the most severe he has had in a long time. Discussed alternative agents, and recent data supporting hydrea use in [place] disease. Discussed adakveo and endari as well. Discussed benefits, risks, and side effects of each. Discussed he could have time to consider further, but he requested to proceed with hydrea. Started at 1000mg .  - again discussed importance of follow up. Specifically in the setting of lab monitoring with the addition of hydrea, stressed importance of regular follow up to ensure continued efficacy and benefit. He noted understanding.  - continue hydrea 1000mg  daily  - hgb a bit decreased today at 9.3g. if remains decreased will plan for dose reduction in hydrea. WBC and platelet normal.  - Stay well hydrated, avoid heat, cold, stress, and infection triggers.  - Continue folic acid  1 mg daily for bone marrow support. Refill of folic acid  sent today.     2. Pulmonary evaluation - He denies severe recurrent wheezes, shortness of breath with exercise, or persistent cough. If these symptoms develop, pulmonary function tests with spirometry  will be ordered, and if abnormal, plan on referral to Pulmonology for further evaluation. No issues today.   3. Cardiac - no issues today.  Echocardiogram Complete [facility]  09/23/2016 Study Conclusions:    Left ventricle: The cavity size was normal. There was mild concentric hypertrophy. Systolic function was normal. The estimated ejection fraction was in the range of 55% to 60%. Wall motion was normal; there were no regional wall motion abnormalities. Left ventricular diastolic function parameters were normal. Aortic valve: Transvalvular velocity was within the normal range. There was no stenosis. There was trivial regurgitation. Mitral valve: Transvalvular velocity was within the normal range. There was no evidence for stenosis. There was trivial regurgitation. Right ventricle: The cavity size was normal. Wall thickness was normal. Systolic function was normal. Atrial septum: No defect or patent foramen ovale was identified by color flow Doppler. Tricuspid valve: There was no regurgitation. Pulmonary arteries: Systolic pressure was within the normal range. PA peak pressure: 31 mm Hg (S). Pericardium, extracardiac: A trivial pericardial effusion was identified.    Prepared and Electronically Authenticated by Annabella Scarce, MD 2017-12-26T12:07:25  4. Eye - High risk of proliferative retinopathy.  Annual eye exam recommended to the patient.  He experienced a L retinal detachment around May 2024 and has undergone two surgeries since that time and continues to follow closely with ophthalmology. He reports he is experiencing some improvement, though slowly. Continues to improve today.    5. Immunization status - uptodate on pneumovax and menactra.    6. Acute and chronic musculoskeletal pain - Continue Percocet every 8 hours for breakthrough pain.  We reminded Mr. Kalka that all patients receiving Schedule II narcotics must be seen for follow up every three months.  Has had previous discussions surrounding history of short course refills from outside providers, and reviewed treatment agreement requirements to remain eligible for narcotics from this clinic.  - discussed  we certainly understand unexpected complications requiring rescheduling needs, but he has missed several appointments and the lengthy gaps between clinic visits are not in line with treatment agreement. Again expressed to him we can send prescriptions more often than every 3 months, but he must be seen every 3 months to remain eligible to receive those refills for appropriate monitoring and oversight.  - today, follow up appears to be improving. Encouraged continued regular 3 month follow up.    7. Iron assessment - ferritin and % sat low at previous visit. He also had a drop in MCV that has correlated with drop in ferritin and %sat. He began taking an oral iron supplementation daily on MWF. He reports he took this until he ran out. Levels have improved since then and are stable today. Will hold for now but continue to monitor for any further need for oral supplementation.  - remain normalized today    8. Vitamin D deficiency - Continue OTC Vitamin D 2,000 Units daily.  Levels have increased today. Will monitor and if continue to increase, will discuss adjusting supplementation to prevent excess levels.    9. Renal - had previously followed with [facility] nephrology, but transitioned care to outside provider local to him in [place].    Plan: follow up with labs in 3 months     The above recommendations are taken from the NIH Evidence-Based Management of Sickle Cell Disease: Expert Panel Report, 2014+  No orders of the defined types were placed in this encounter.  Scheduled Future Appointments       Provider Department Dept  Phone Center   08/11/2024 3:00 PM HEM ONC 03 LAB [facility] [place] [facility] Hematology Oncology [phone] [facility] Comp Can   08/11/2024 3:30 PM Lauraine Almarie Newness [facility] [place] [facility] Hematology Oncology [phone] [facility] Comp Can        Electronically signed by:  Lauraine Newness, PA-C, 05/12/2024 3:59 PM

## 2024-06-14 ENCOUNTER — Encounter: Payer: Self-pay | Admitting: Internal Medicine

## 2024-06-14 ENCOUNTER — Ambulatory Visit: Admitting: Internal Medicine

## 2024-06-14 VITALS — BP 134/82 | HR 94 | Temp 97.7°F | Ht 68.0 in | Wt 236.8 lb

## 2024-06-14 DIAGNOSIS — D571 Sickle-cell disease without crisis: Secondary | ICD-10-CM

## 2024-06-14 DIAGNOSIS — E782 Mixed hyperlipidemia: Secondary | ICD-10-CM | POA: Diagnosis not present

## 2024-06-14 DIAGNOSIS — Z6836 Body mass index (BMI) 36.0-36.9, adult: Secondary | ICD-10-CM

## 2024-06-14 DIAGNOSIS — R351 Nocturia: Secondary | ICD-10-CM

## 2024-06-14 DIAGNOSIS — E66812 Obesity, class 2: Secondary | ICD-10-CM

## 2024-06-14 DIAGNOSIS — N182 Chronic kidney disease, stage 2 (mild): Secondary | ICD-10-CM

## 2024-06-14 DIAGNOSIS — L910 Hypertrophic scar: Secondary | ICD-10-CM

## 2024-06-14 DIAGNOSIS — I129 Hypertensive chronic kidney disease with stage 1 through stage 4 chronic kidney disease, or unspecified chronic kidney disease: Secondary | ICD-10-CM

## 2024-06-14 DIAGNOSIS — N1831 Type 2 diabetes mellitus with diabetic chronic kidney disease: Secondary | ICD-10-CM

## 2024-06-14 DIAGNOSIS — E1122 Type 2 diabetes mellitus with diabetic chronic kidney disease: Secondary | ICD-10-CM | POA: Diagnosis not present

## 2024-06-14 NOTE — Progress Notes (Signed)
 I,Victoria T Emmitt, CMA,acting as a Neurosurgeon for Catheryn LOISE Slocumb, MD.,have documented all relevant documentation on the behalf of Catheryn LOISE Slocumb, MD,as directed by  Catheryn LOISE Slocumb, MD while in the presence of Catheryn LOISE Slocumb, MD.  Subjective:  Patient ID: Nicolas [name] , male    DOB: March 21, 1976 , 48 y.o.   MRN: [id number]  Chief Complaint  Patient presents with   Hypertension    Patient presents today for bp & dm follow up. He reports compliance with medications. Denies headache, chest pain & sob. He has a bump on both ears, possibly keloids he would like removed.    Diabetes    HPI Discussed the use of AI scribe software for clinical note transcription with the patient, who gave verbal consent to proceed.  History of Present Illness Nicolas [name] is a 48 year old male with diabetes, hypertension, and sickle cell disease who presents for routine follow-up.  He has developed keloids on both ears due to improperly done piercings. The keloid on the left ear is small, while the one on the right is stiff.  He monitors his blood sugar regularly, with values between 90 and 102 mg/dL. He recalls a previous high creatinine level and tries to drink five to six glasses of water  daily. His hemoglobin was low earlier this year, and he is scheduled to return to the sickle cell clinic in November.  He experiences minor pain related to sickle cell disease, which is not severe. He is currently taking hydroxyurea for sickle cell disease and oxycodone  as needed for pain. He also takes iron supplements on Monday, Wednesday, and Friday as recommended by his hematologist.  His current medications include amlodipine  10 mg, carvedilol  6.25 mg twice a day, folic acid , hydroxyurea, lisinopril  10 mg, oxycodone  as needed, pravastatin , Ozempic  0.5 mg, tizanidine  as a muscle relaxer, and triamterene . He confirms that the kidney doctor recently put him back on lisinopril .  He received a flu shot at [facility] in  late August and plans to travel to visit his mother in December, returning in late February. He mentions that his parents are getting older, and he wants to spend time with them.   Diabetes He presents for his follow-up diabetic visit. He has type 2 diabetes mellitus. There are no hypoglycemic associated symptoms. There are no diabetic associated symptoms. Pertinent negatives for diabetes include no blurred vision, no chest pain, no polydipsia, no polyphagia and no polyuria. There are no hypoglycemic complications. Diabetic complications include nephropathy. Risk factors for coronary artery disease include diabetes mellitus, male sex, obesity, hypertension and dyslipidemia. Current diabetic treatment includes oral agent (dual therapy). His weight is stable. He is following a diabetic diet. When asked about meal planning, he reported none. He has not had a previous visit with a dietitian. He participates in exercise intermittently. His home blood glucose trend is fluctuating minimally. His breakfast blood glucose is taken between 8-9 am. His breakfast blood glucose range is generally 110-130 mg/dl. (Reports blood sugar has been good but does not know the readings. ) An ACE inhibitor/angiotensin II receptor blocker is being taken. Eye exam current: he needs to call Dr. Jacques for an appt..  Hypertension This is a chronic problem. The current episode started more than 1 year ago. The problem has been gradually improving since onset. The problem is controlled. Pertinent negatives include no blurred vision, chest pain, palpitations or shortness of breath. Past treatments include ACE inhibitors, calcium  channel blockers and beta blockers. The current  treatment provides moderate improvement. Compliance problems include exercise.  Hypertensive end-organ damage includes kidney disease.     Past Medical History:  Diagnosis Date   Arthritis    arthritis- back & L hip   Depression    situational depression, pt.  out of work    Diabetes mellitus (HCC)    HTN (hypertension) 05/10/2015   Osteoarthritis of left hip 06/07/2012   Sickle cell anemia (HCC)      Family History  Problem Relation Age of Onset   Diabetes Mother    Cancer - Other Father    Liver cancer Father    CAD Neg Hx      Current Outpatient Medications:    amLODipine  (NORVASC ) 10 MG tablet, Take 1 tablet (10 mg total) by mouth daily., Disp: 90 tablet, Rfl: 2   carvedilol  (COREG ) 6.25 MG tablet, Take 1 tablet (6.25 mg total) by mouth 2 (two) times daily with a meal., Disp: 180 tablet, Rfl: 2   Cholecalciferol  (VITAMIN D3) 2000 units capsule, Take 2,000 Units by mouth daily., Disp: , Rfl: 5   folic acid  (FOLVITE ) 1 MG tablet, Take 1 tablet (1 mg total) by mouth daily., Disp: 30 tablet, Rfl: 11   glucose blood test strip, Use as instructed, Disp: 100 each, Rfl: 12   hydroxyurea (HYDREA) 500 MG capsule, Take 1,000 mg by mouth daily., Disp: , Rfl:    Lancets (ACCU-CHEK MULTICLIX) lancets, Use as instructed, Disp: 100 each, Rfl: 12   lisinopril  (ZESTRIL ) 10 MG tablet, Take 10 mg by mouth daily., Disp: , Rfl:    metFORMIN  (GLUCOPHAGE ) 500 MG tablet, Take 1 tablet (500 mg total) by mouth 2 (two) times daily with a meal., Disp: 180 tablet, Rfl: 2   Multiple Vitamin (ONE-A-DAY MENS PO), Take 1 tablet by mouth daily with breakfast., Disp: , Rfl:    oxyCODONE -acetaminophen  (PERCOCET) 7.5-325 MG tablet, Take 1 tablet by mouth every 4 (four) hours as needed for severe pain (pain score 7-10)., Disp: 20 tablet, Rfl: 0   pravastatin  (PRAVACHOL ) 20 MG tablet, Take 1 tablet (20 mg total) by mouth every evening., Disp: 90 tablet, Rfl: 2   Semaglutide ,0.25 or 0.5MG /DOS, (OZEMPIC , 0.25 OR 0.5 MG/DOSE,) 2 MG/3ML SOPN, INJECT 0.5 MG INTO THE SKIN ONCE A WEEK., Disp: 9 mL, Rfl: 3   tiZANidine  (ZANAFLEX ) 4 MG tablet, TAKE 1 TABLET BY MOUTH EVERY DAY, Disp: 90 tablet, Rfl: 2   triamterene -hydrochlorothiazide  (MAXZIDE -25) 37.5-25 MG tablet, Take 1 tablet by mouth  daily., Disp: 90 tablet, Rfl: 2   No Known Allergies   Review of Systems  Constitutional: Negative.   Eyes:  Negative for blurred vision.  Respiratory: Negative.  Negative for shortness of breath.   Cardiovascular: Negative.  Negative for chest pain and palpitations.  Gastrointestinal: Negative.   Endocrine: Negative for polydipsia, polyphagia and polyuria.  Skin: Negative.   Allergic/Immunologic: Negative.   Hematological: Negative.      Today's Vitals   06/14/24 1503  BP: 134/82  Pulse: 94  Temp: 97.7 F (36.5 C)  SpO2: 98%  Weight: 236 lb 12.8 oz (107.4 kg)  Height: 5' 8 (1.727 m)   Body mass index is 36.01 kg/m.  Wt Readings from Last 3 Encounters:  06/14/24 236 lb 12.8 oz (107.4 kg)  03/16/24 230 lb 3.2 oz (104.4 kg)  03/11/24 227 lb (103 kg)     Objective:  Physical Exam Vitals and nursing note reviewed.  Constitutional:      Appearance: Normal appearance. He is obese.  HENT:  Head: Normocephalic and atraumatic.  Eyes:     Extraocular Movements: Extraocular movements intact.  Cardiovascular:     Rate and Rhythm: Normal rate and regular rhythm.     Heart sounds: Normal heart sounds.  Pulmonary:     Effort: Pulmonary effort is normal.     Breath sounds: Normal breath sounds.  Musculoskeletal:     Cervical back: Normal range of motion.  Skin:    General: Skin is warm.     Comments: Keloid on ear  Neurological:     General: No focal deficit present.     Mental Status: He is alert.  Psychiatric:        Mood and Affect: Mood normal.           Assessment And Plan:  Type 2 diabetes mellitus with stage 2 chronic kidney disease, without long-term current use of insulin  (HCC) Assessment & Plan: Chronic, he will continue with metformin  twice daily. I am hesitant to use SGLT2i due to risk of dehydration which could trigger sickle cell crisis. Would also benefit from GLP-1 agonists. Will need to consider oral option if he is not willing to consider  weekly injectable option.   Orders: -     Fructosamine -     CMP14+EGFR  Benign hypertension with CKD (chronic kidney disease), stage II Assessment & Plan: Chronic condition with controlled blood glucose and previous elevated creatinine indicating renal impairment. - Recheck creatinine levels. - Continue amlodipine , carvedilol , lisinopril , triamterene . - Ensure adequate hydration. - Follow up with nephrologist next month.   Sickle cell disease without crisis [facility]) Assessment & Plan: Chronic condition with minor pain and low hemoglobin requiring re-evaluation. - Recheck hemoglobin levels. - Continue hydroxyurea and folic acid . - Follow up with sickle cell clinic in November.  Orders: -     CBC with Differential/Platelet -     Reticulocytes  Keloid Assessment & Plan: Keloid formation from previous piercing. Advised on cosmetic removal costs and steroid injection alternative. - Refer to dermatologist/Plastic Surgery for evaluation and possible removal. - Consider steroid injections to shrink keloids.  Orders: -     Ambulatory referral to Plastic Surgery  Class 2 severe obesity due to excess calories with serious comorbidity and body mass index (BMI) of 36.0 to 36.9 in adult Assessment & Plan: He is encouraged to initially strive for BMI less than 30 to decrease cardiac risk. He is advised to exercise no less than 150 minutes per week.      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 - Recheck creatinine levels. - Continue amlodipine , carvedilol , lisinopril , triamterene . - Ensure adequate hydration. - Follow up with nephrologist next month.  Sickle-cell disease without crisis Chronic condition with minor pain and low hemoglobin requiring re-evaluation. - Recheck hemoglobin levels. -  Continue hydroxyurea and folic acid . - Follow up with sickle cell clinic in November.  Return for 4 month dm f/u.SABRA  Patient was given opportunity to ask questions. Patient verbalized understanding of the plan and was able to repeat key elements of the plan. All questions were answered to their satisfaction.   I, Catheryn LOISE Slocumb, MD, have reviewed all documentation for this visit. The documentation on 06/14/24 for the exam, diagnosis, procedures, and orders are all accurate and complete.   IF YOU HAVE BEEN REFERRED TO A SPECIALIST, IT MAY TAKE 1-2 WEEKS TO SCHEDULE/PROCESS THE REFERRAL.  IF YOU HAVE NOT HEARD FROM US /SPECIALIST IN TWO WEEKS, PLEASE GIVE US  A CALL AT [phone].   THE PATIENT IS ENCOURAGED TO PRACTICE SOCIAL DISTANCING DUE TO THE COVID-19 PANDEMIC.

## 2024-06-14 NOTE — Patient Instructions (Signed)

## 2024-06-15 ENCOUNTER — Ambulatory Visit: Payer: Self-pay | Admitting: Internal Medicine

## 2024-06-15 LAB — CBC WITH DIFFERENTIAL/PLATELET
Basophils Absolute: 0 x10E3/uL (ref 0.0–0.2)
Basos: 0 %
EOS (ABSOLUTE): 0.1 x10E3/uL (ref 0.0–0.4)
Eos: 1 %
Hematocrit: 27.3 % — ABNORMAL LOW (ref 37.5–51.0)
Hemoglobin: 9.2 g/dL — ABNORMAL LOW (ref 13.0–17.7)
Immature Grans (Abs): 0 x10E3/uL (ref 0.0–0.1)
Immature Granulocytes: 0 %
Lymphocytes Absolute: 2 x10E3/uL (ref 0.7–3.1)
Lymphs: 35 %
MCH: 41.4 pg — ABNORMAL HIGH (ref 26.6–33.0)
MCHC: 33.7 g/dL (ref 31.5–35.7)
MCV: 123 fL — ABNORMAL HIGH (ref 79–97)
Monocytes Absolute: 0.6 x10E3/uL (ref 0.1–0.9)
Monocytes: 10 %
NRBC: 9 % — ABNORMAL HIGH (ref 0–0)
Neutrophils Absolute: 3.1 x10E3/uL (ref 1.4–7.0)
Neutrophils: 54 %
Platelets: 283 x10E3/uL (ref 150–450)
RBC: 2.22 x10E6/uL — CL (ref 4.14–5.80)
RDW: 15.8 % — ABNORMAL HIGH (ref 11.6–15.4)
WBC: 5.8 x10E3/uL (ref 3.4–10.8)

## 2024-06-15 LAB — FRUCTOSAMINE: Fructosamine: 358 umol/L — ABNORMAL HIGH (ref 0–285)

## 2024-06-15 LAB — CMP14+EGFR
ALT: 29 IU/L (ref 0–44)
AST: 54 IU/L — ABNORMAL HIGH (ref 0–40)
Albumin: 4.9 g/dL (ref 4.1–5.1)
Alkaline Phosphatase: 57 IU/L (ref 47–123)
BUN/Creatinine Ratio: 16 (ref 9–20)
BUN: 23 mg/dL (ref 6–24)
Bilirubin Total: 0.6 mg/dL (ref 0.0–1.2)
CO2: 27 mmol/L (ref 20–29)
Calcium: 9.9 mg/dL (ref 8.7–10.2)
Chloride: 95 mmol/L — ABNORMAL LOW (ref 96–106)
Creatinine, Ser: 1.42 mg/dL — ABNORMAL HIGH (ref 0.76–1.27)
Globulin, Total: 2.8 g/dL (ref 1.5–4.5)
Glucose: 152 mg/dL — ABNORMAL HIGH (ref 70–99)
Potassium: 4 mmol/L (ref 3.5–5.2)
Sodium: 141 mmol/L (ref 134–144)
Total Protein: 7.7 g/dL (ref 6.0–8.5)
eGFR: 60 mL/min/1.73 (ref >59)

## 2024-06-15 LAB — RETICULOCYTES: Retic Ct Pct: 2.6 % (ref 0.6–2.6)

## 2024-06-24 DIAGNOSIS — L910 Hypertrophic scar: Secondary | ICD-10-CM | POA: Insufficient documentation

## 2024-06-24 NOTE — Assessment & Plan Note (Signed)
 Chronic condition with controlled blood glucose and previous elevated creatinine indicating renal impairment. - Recheck creatinine levels. - Continue amlodipine , carvedilol , lisinopril , triamterene . - Ensure adequate hydration. - Follow up with nephrologist next month.

## 2024-06-24 NOTE — Assessment & Plan Note (Signed)
 Chronic, he will continue with metformin  twice daily. I am hesitant to use SGLT2i due to risk of dehydration which could trigger sickle cell crisis. Would also benefit from GLP-1 agonists. Will need to consider oral option if he is not willing to consider weekly injectable option.

## 2024-06-24 NOTE — Assessment & Plan Note (Signed)
 Chronic condition with minor pain and low hemoglobin requiring re-evaluation. - Recheck hemoglobin levels. - Continue hydroxyurea and folic acid . - Follow up with sickle cell clinic in November.

## 2024-06-24 NOTE — Assessment & Plan Note (Addendum)
 Keloid formation from previous piercing. Advised on cosmetic removal costs and steroid injection alternative. - Refer to dermatologist/Plastic Surgery for evaluation and possible removal. - Consider steroid injections to shrink keloids.

## 2024-06-25 ENCOUNTER — Other Ambulatory Visit: Payer: Self-pay | Admitting: Internal Medicine

## 2024-06-26 NOTE — Assessment & Plan Note (Signed)
 He is encouraged to initially strive for BMI less than 30 to decrease cardiac risk. He is advised to exercise no less than 150 minutes per week.

## 2024-07-01 ENCOUNTER — Ambulatory Visit

## 2024-07-01 VITALS — BP 144/81 | HR 71 | Ht 68.0 in | Wt 230.0 lb

## 2024-07-01 DIAGNOSIS — L910 Hypertrophic scar: Secondary | ICD-10-CM | POA: Diagnosis not present

## 2024-07-01 NOTE — Progress Notes (Signed)
 NAME: Nicolas [name]  MRN: [id number]  DOB: 02-15-1976    Referring physician:??Jarold Medici, MD  PCP:?Sanders, Medici, MD    CHIEF COMPLAINT:?Keloid on bilateral posterior helix    HPI:?  This is a 48 y.o. year old male with PMH and PSH as below who presents in consultation for keloid on bilateral posterior helix    It has been present for years. History of piercing in the area.  Is the keloid painful, itchy, or tender? Denies  Has the size changed over time? Slowly growing   Previous treatments? (steroid injections, silicone sheets, surgery)? Denies  Do any family members have a history of keloids or hypertrophic scars? Denies  Does the keloid affect your movement or cause discomfort? Denies  Patient denies h/o smoking, HTN, coagulopathies, or thyroid  conditions. Not on any anticoagulation.     Family History:   Family history is negative for bleeding/clotting disorders, problems with anesthesia, connective tissue disorders.     Social History:   Social History   Socioeconomic History   Marital status: Married    Spouse name: Afiwavi   Number of children: 2   Years of education: Not on file   Highest education level: Not on file  Occupational History   Occupation: Disabled  Tobacco Use   Smoking status: Never   Smokeless tobacco: Never  Vaping Use   Vaping status: Never Used  Substance and Sexual Activity   Alcohol use: Not Currently    Comment: ocassional    Drug use: Yes    Types: Oxycodone    Sexual activity: Yes  Other Topics Concern   Not on file  Social History Narrative   Not on file   Social Drivers of Health   Financial Resource Strain: Low Risk  (03/16/2024)   Overall Financial Resource Strain (CARDIA)    Difficulty of Paying Living Expenses: Not hard at all  Food Insecurity: No Food Insecurity (03/16/2024)   Hunger Vital Sign    Worried About Running Out of Food in the Last Year: Never true    Ran Out of Food in the Last Year: Never true   Transportation Needs: No Transportation Needs (03/16/2024)   PRAPARE - Administrator, Civil Service (Medical): No    Lack of Transportation (Non-Medical): No  Physical Activity: Sufficiently Active (03/16/2024)   Exercise Vital Sign    Days of Exercise per Week: 4 days    Minutes of Exercise per Session: 60 min  Stress: No Stress Concern Present (03/16/2024)   [employer] of Occupational Health - Occupational Stress Questionnaire    Feeling of Stress: Not at all  Social Connections: Moderately Integrated (03/16/2024)   Social Connection and Isolation Panel    Frequency of Communication with Friends and Family: Three times a week    Frequency of Social Gatherings with Friends and Family: Once a week    Attends Religious Services: More than 4 times per year    Active Member of [employer] or Organizations: No    Attends Banker Meetings: Never    Marital Status: Married    Smoker/Vape Denies  Recreational Drug use: Denies   PMH:  Past Medical History:  Diagnosis Date   Arthritis    arthritis- back & L hip   Depression    situational depression, pt. out of work    Diabetes mellitus (HCC)    HTN (hypertension) 05/10/2015   Osteoarthritis of left hip 06/07/2012   Sickle cell anemia (HCC)  PSH:  Past Surgical History:  Procedure Laterality Date   IR GENERIC HISTORICAL  09/26/2016   IR FLUORO GUIDE CV LINE RIGHT 09/26/2016 Marcey Moan, MD [facility]   IR GENERIC HISTORICAL  09/26/2016   IR US  GUIDE VASC ACCESS RIGHT 09/26/2016 Marcey Moan, MD [facility]   JOINT REPLACEMENT Right 2012   R hip   PHOTOCOAGULATION WITH LASER Right 02/26/2023   Procedure: PHOTOCOAGULATION WITH LASER;  Surgeon: Valdemar Rogue, MD;  Location: [facility] OR;  Service: Ophthalmology;  Laterality: Right;   TOTAL HIP ARTHROPLASTY  06/07/2012   Procedure: TOTAL HIP ARTHROPLASTY;  Surgeon: Fonda SHAUNNA Olmsted, MD;  Location: [facility] OR;  Service: Orthopedics;  Laterality: Left;    VITRECTOMY 25 GAUGE WITH SCLERAL BUCKLE Left 02/26/2023   Procedure: VITRECTOMY 25 GAUGE WITH SCLERAL BUCKLE;  Surgeon: Valdemar Rogue, MD;  Location[facility];  Service: Ophthalmology;  Laterality: Left;      MEDICATIONS:?   Current Outpatient Medications:    amLODipine  (NORVASC ) 10 MG tablet, Take 1 tablet (10 mg total) by mouth daily., Disp: 90 tablet, Rfl: 2   carvedilol  (COREG ) 6.25 MG tablet, Take 1 tablet (6.25 mg total) by mouth 2 (two) times daily with a meal., Disp: 180 tablet, Rfl: 2   Cholecalciferol  (VITAMIN D3) 2000 units capsule, Take 2,000 Units by mouth daily., Disp: , Rfl: 5   folic acid  (FOLVITE ) 1 MG tablet, Take 1 tablet (1 mg total) by mouth daily., Disp: 30 tablet, Rfl: 11   glucose blood test strip, Use as instructed, Disp: 100 each, Rfl: 12   hydroxyurea (HYDREA) 500 MG capsule, Take 1,000 mg by mouth daily., Disp: , Rfl:    Lancets (ACCU-CHEK MULTICLIX) lancets, Use as instructed, Disp: 100 each, Rfl: 12   lisinopril  (ZESTRIL ) 10 MG tablet, Take 10 mg by mouth daily., Disp: , Rfl:    metFORMIN  (GLUCOPHAGE ) 500 MG tablet, Take 1 tablet (500 mg total) by mouth 2 (two) times daily with a meal., Disp: 180 tablet, Rfl: 2   Multiple Vitamin (ONE-A-DAY MENS PO), Take 1 tablet by mouth daily with breakfast., Disp: , Rfl:    oxyCODONE -acetaminophen  (PERCOCET) 7.5-325 MG tablet, Take 1 tablet by mouth every 4 (four) hours as needed for severe pain (pain score 7-10)., Disp: 20 tablet, Rfl: 0   pravastatin  (PRAVACHOL ) 20 MG tablet, Take 1 tablet (20 mg total) by mouth every evening., Disp: 90 tablet, Rfl: 2   Semaglutide ,0.25 or 0.5MG /DOS, (OZEMPIC , 0.25 OR 0.5 MG/DOSE,) 2 MG/3ML SOPN, INJECT 0.5 MG INTO THE SKIN ONCE A WEEK., Disp: 9 mL, Rfl: 3   tiZANidine  (ZANAFLEX ) 4 MG tablet, TAKE 1 TABLET BY MOUTH EVERY DAY, Disp: 30 tablet, Rfl: 8   triamterene -hydrochlorothiazide  (MAXZIDE -25) 37.5-25 MG tablet, Take 1 tablet by mouth daily., Disp: 90 tablet, Rfl: 2    ALLERGIES:?  has no  known allergies.    REVIEW OF SYSTEMS:  Review of Systems  ROS negative except as noted in HPI      VITALS:  Blood pressure (!) 144/81, pulse 71, height 5' 8 (1.727 m), weight 230 lb (104.3 kg), SpO2 99%.   BP (!) 144/81   Pulse 71   Ht 5' 8 (1.727 m)   Wt 230 lb (104.3 kg)   SpO2 99%   BMI 34.97 kg/m   Body mass index is 34.97 kg/m.  General: Well appearing, no apparent distress.  Neuro: A&Ox3 HEENT: Normocephalic, atraumatic, PERRL.  Keloid/s location & Size: bilateral posterior helix raised, shiny, firm, edges well-defined not spreading beyond the  original wound, no ulceration, infection, or secondary changes.    ASSESSMENT/PLAN  Assessment & Plan    Today we discussed the risks, benefits and alternatives to keloid excision. We discussed the alternatives which include continued observation; however, I told the patient that I do not believe these keloids will resolve on their own. We discussed non-surgical management including steroid and/or 5FU injection; however, I believe given the anatomy of the these lesions, it is very unlikely that they will resolve with intralesional injection. We then discussed the benefits of surgical excision which include complete removal of the lesion; however, I quoted the patient a 100% risk of recurrence without using adjuvant measures. We discussed the use of adjuvant steroids, and that the patient will require several cycles of postoperative intralesional injection allow for the best chance to avoid recurrence. We discussed the utility of adjuvant radiation; and I felt that an attempt at excision followed by adjuvant radiation would be appropriate. Even with all excisional, neoadjuvant and adjuvant therapies, we had a very transparent discussion on the high rate of recurrence. We discussed the risks of excision and adjuvant radiation therapy which include infection, bleeding, damage to surrounding healthy tissue and need for further surgery. We also  discussed the risks of skin and fat atrophy, hypo- or hyperpigmentation, wound separation and recurrent abnormal scarring which includes the recurrence of keloids. We discussed the risks of anesthesia which will be further elaborated on by our anesthesia colleagues and the risks of radiation which will be further elaborated by our radiation oncology colleagues. The patient has a good understanding of all the risks and benefits, postoperative course and care. We obtained pictures and will give the patient quotes. All questions were answered.     The plan agreed upon includes: Excision of keloid/s with radiation therapy to follow. Referral to radiation oncology colleagues requested.   I spent 30 minutes counseling the patient and discussing plan of care.  Addi Pak M Easten Maceachern [facility]

## 2024-07-04 ENCOUNTER — Ambulatory Visit: Payer: Self-pay

## 2024-07-04 DIAGNOSIS — E1122 Type 2 diabetes mellitus with diabetic chronic kidney disease: Secondary | ICD-10-CM | POA: Diagnosis not present

## 2024-07-04 DIAGNOSIS — I129 Hypertensive chronic kidney disease with stage 1 through stage 4 chronic kidney disease, or unspecified chronic kidney disease: Secondary | ICD-10-CM | POA: Diagnosis not present

## 2024-07-04 DIAGNOSIS — E870 Hyperosmolality and hypernatremia: Secondary | ICD-10-CM | POA: Diagnosis not present

## 2024-07-04 DIAGNOSIS — N1831 Chronic kidney disease, stage 3a: Secondary | ICD-10-CM | POA: Diagnosis not present

## 2024-07-04 DIAGNOSIS — R809 Proteinuria, unspecified: Secondary | ICD-10-CM | POA: Diagnosis not present

## 2024-07-13 NOTE — Progress Notes (Signed)
 Location of Concern: Bilateral posterior helix   Patient presented with the following signs/symptoms,  bump on the back of ear two years ago.  Past/Anticipated interventions by patient's surgeon/dermatologist for current problematic lesion, if any: None    Past Keloids, if any: 1) Location/Histology/Intervention: None   It has been present for years. History of piercing in the area.   Is the keloid painful, itchy, or tender? Denies  Has the size changed over time? Slowly growing   Previous treatments? (steroid injections, silicone sheets, surgery)? Denies   Do any family members have a history of keloids or hypertrophic scars? Denies   Does the keloid affect your movement or cause discomfort? Denies      SAFETY ISSUES: Prior radiation? No Pacemaker/ICD? No Possible current pregnancy? Male Is the patient on methotrexate? No     BP 130/83 (BP Location: Left Arm, Patient Position: Sitting, Cuff Size: Large)   Pulse (!) 103   Temp (!) 97.4 F (36.3 C)   Resp 20   Ht 5' 8 (1.727 m)   Wt 228 lb 9.6 oz (103.7 kg)   SpO2 100%   BMI 34.76 kg/m

## 2024-07-18 ENCOUNTER — Ambulatory Visit
Admission: RE | Admit: 2024-07-18 | Discharge: 2024-07-18 | Disposition: A | Discharge status: Home / Self Care | Source: Ambulatory Visit | Attending: Radiation Oncology | Admitting: Radiation Oncology

## 2024-07-18 ENCOUNTER — Encounter: Payer: Self-pay | Admitting: Radiation Oncology

## 2024-07-18 VITALS — BP 130/83 | HR 103 | Temp 97.4°F | Resp 20 | Ht 68.0 in | Wt 228.6 lb

## 2024-07-18 DIAGNOSIS — L910 Hypertrophic scar: Secondary | ICD-10-CM

## 2024-07-18 NOTE — Progress Notes (Signed)
 Radiation Oncology         [phone] ________________________________  Name: Nicolas [name] MRN: [id number]  Date: 07/18/2024  DOB: 22-Jan-1976  RR:Djwizmd, Catheryn, MD  [name] HERO,*     REFERRING PHYSICIAN: Montorfano, Lisandro M,*   DIAGNOSIS: Keloid, L91.0    HISTORY OF PRESENT ILLNESS::Nicolas [name] is a 48 y.o. male who is seen for an initial consultation visit regarding the patient's diagnosis of keloids. The keloids involve the bilateral posterior helices. The keloids first appeared at this location around a year ago after undergoing ear piercings. Symptoms have included mild disfigurement but no pain or itching. The keloid has not undergone surgery. Conservatives measures have not yet been attempted.  The patient is being planned to undergo resection. Based on the clinical history, the keloid is felt to be at high risk of recurrence after surgery. I have therefore been asked to see the patient today for evaluation of possible adjuvant radiation treatment to the surgical area.  Denies any hx of keloids or any family hx of keloids except in his wife who developed a keloid from an ear piercing. Their ear piercings were performed by the same person and so he wonders if the keloids are a result of that person's technique.   PREVIOUS RADIATION THERAPY: No   PAST MEDICAL HISTORY:  has a past medical history of Arthritis, Depression, Diabetes mellitus (HCC), HTN (hypertension) (05/10/2015), Osteoarthritis of left hip (06/07/2012), and Sickle cell anemia (HCC).     PAST SURGICAL HISTORY: Past Surgical History:  Procedure Laterality Date   IR GENERIC HISTORICAL  09/26/2016   IR FLUORO GUIDE CV LINE RIGHT 09/26/2016 Marcey Moan, MD [facility]   IR GENERIC HISTORICAL  09/26/2016   IR US  GUIDE VASC ACCESS RIGHT 09/26/2016 Marcey Moan, MD [facility]   JOINT REPLACEMENT Right 2012   R hip   PHOTOCOAGULATION WITH LASER Right 02/26/2023   Procedure:  PHOTOCOAGULATION WITH LASER;  Surgeon: Valdemar Rogue, MD;  Location: [facility] OR;  Service: Ophthalmology;  Laterality: Right;   TOTAL HIP ARTHROPLASTY  06/07/2012   Procedure: TOTAL HIP ARTHROPLASTY;  Surgeon: Fonda SHAUNNA Olmsted, MD;  Location: [facility] OR;  Service: Orthopedics;  Laterality: Left;   VITRECTOMY 25 GAUGE WITH SCLERAL BUCKLE Left 02/26/2023   Procedure: VITRECTOMY 25 GAUGE WITH SCLERAL BUCKLE;  Surgeon: Valdemar Rogue, MD;  Location: [facility] OR;  Service: Ophthalmology;  Laterality: Left;     FAMILY HISTORY: family history includes Cancer - Other in his father; Diabetes in his mother; Liver cancer in his father.   SOCIAL HISTORY:  reports that he has never smoked. He has never used smokeless tobacco. He reports that he does not currently use alcohol. He reports current drug use. Drug: Oxycodone .   ALLERGIES: Patient has no known allergies.   MEDICATIONS:  Current Outpatient Medications  Medication Sig Dispense Refill   amLODipine  (NORVASC ) 10 MG tablet Take 1 tablet (10 mg total) by mouth daily. 90 tablet 2   carvedilol  (COREG ) 6.25 MG tablet Take 1 tablet (6.25 mg total) by mouth 2 (two) times daily with a meal. 180 tablet 2   Cholecalciferol  (VITAMIN D3) 2000 units capsule Take 2,000 Units by mouth daily.  5   folic acid  (FOLVITE ) 1 MG tablet Take 1 tablet (1 mg total) by mouth daily. 30 tablet 11   glucose blood test strip Use as instructed 100 each 12   hydroxyurea (HYDREA) 500 MG capsule Take 1,000 mg by mouth daily.     Lancets (ACCU-CHEK MULTICLIX) lancets  Use as instructed 100 each 12   lisinopril  (ZESTRIL ) 10 MG tablet Take 10 mg by mouth daily.     metFORMIN  (GLUCOPHAGE ) 500 MG tablet Take 1 tablet (500 mg total) by mouth 2 (two) times daily with a meal. 180 tablet 2   Multiple Vitamin (ONE-A-DAY MENS PO) Take 1 tablet by mouth daily with breakfast.     oxyCODONE -acetaminophen  (PERCOCET) 7.5-325 MG tablet Take 1 tablet by mouth every 4 (four) hours as needed for severe pain (pain  score 7-10). 20 tablet 0   pravastatin  (PRAVACHOL ) 20 MG tablet Take 1 tablet (20 mg total) by mouth every evening. 90 tablet 2   Semaglutide ,0.25 or 0.5MG /DOS, (OZEMPIC , 0.25 OR 0.5 MG/DOSE,) 2 MG/3ML SOPN INJECT 0.5 MG INTO THE SKIN ONCE A WEEK. 9 mL 3   tiZANidine  (ZANAFLEX ) 4 MG tablet TAKE 1 TABLET BY MOUTH EVERY DAY 30 tablet 8   triamterene -hydrochlorothiazide  (MAXZIDE -25) 37.5-25 MG tablet Take 1 tablet by mouth daily. 90 tablet 2   No current facility-administered medications for this encounter.     REVIEW OF SYSTEMS:  As documented in the HPI    PHYSICAL EXAM:  height is 5' 8 (1.727 m) and weight is 228 lb 9.6 oz (103.7 kg). His temperature is 97.4 F (36.3 C) (abnormal). His blood pressure is 130/83 and his pulse is 103 (abnormal). His respiration is 20 and oxygen saturation is 100%.    Physical Exam Vitals reviewed.  Constitutional:      General: He is not in acute distress. Eyes:     Extraocular Movements: Extraocular movements intact.  Cardiovascular:     Rate and Rhythm: Normal rate.  Pulmonary:     Effort: Pulmonary effort is normal.  Abdominal:     General: There is no distension.  Musculoskeletal:        General: Normal range of motion.     Cervical back: Normal range of motion.  Skin:    General: Skin is warm and dry.  Neurological:     General: No focal deficit present.     Mental Status: He is alert.     Gait: Gait normal.  Psychiatric:        Mood and Affect: Mood normal.     A keloid is present on the bilateral posterior helices.       LABORATORY DATA:  Lab Results  Component Value Date   WBC 5.8 06/14/2024   HGB 9.2 (L) 06/14/2024   HCT 27.3 (L) 06/14/2024   MCV 123 (H) 06/14/2024   PLT 283 06/14/2024   Lab Results  Component Value Date   NA 141 06/14/2024   K 4.0 06/14/2024   CL 95 (L) 06/14/2024   CO2 27 06/14/2024   Lab Results  Component Value Date   ALT 29 06/14/2024   AST 54 (H) 06/14/2024   ALKPHOS 57 06/14/2024    BILITOT 0.6 06/14/2024      RADIOGRAPHY: No pertinent radiology     IMPRESSION:   Mr. Beach is a 48 yo M w/ keloids involving the bilateral posterior helices and is being planned to undergo surgery on 08/15/24. The patient is at risk for recurrence following surgical resection alone, and adjuvant radiation treatment likely would be beneficial to the patient in terms of preventing recurrence. The rationale of this treatment has been discussed with the patient. We have discussed the potential benefit in terms of local control of the keloid. We have also discussed potential side effects and risks including but not limited to  skin irritation, poor wound healing, and the risk of radiation induced malignancies.   The patient is not currently bothered by his keloids enough to proceed with adjuvant radiation at this time. He would like to try more conservative measures such as steroid injections and if those do not work he would be interested in surgery alone. Only if they continue to recur and/or become larger/more symptomatic would he be interested in radiation treatments.  All of the patient's questions were answered. The patient does not wish to proceed with adjuvant radiation treatment.  PLAN: Will touch base with Dr. Montorfano about patient's wishes not to undergo radiation at this time.  ________________________________  Total time spent today in preparation for this visit was 50 minutes. This included patient care documentation, multidisciplinary discussion and coordination of care and follow up.    Estefana HERO. Maritza, M.D.

## 2024-07-20 ENCOUNTER — Ambulatory Visit

## 2024-07-20 VITALS — BP 157/95 | HR 99

## 2024-07-20 DIAGNOSIS — L910 Hypertrophic scar: Secondary | ICD-10-CM | POA: Diagnosis not present

## 2024-07-20 NOTE — Progress Notes (Signed)
 Procedure Note  Preoperative Dx: Bilateral ear keloids  Postoperative Dx: Same  Procedure: Bilateral ear keloid injection  Anesthesia: Lidocaine  1% mixed with Kenolog (50%/50%)   Indication for Procedure: 48 year old male with lateral ear keloids. In the last consultation we discussed surgery with XRT.  After discussing risks and benefits of XRT with radiation oncologist, patient decided not to proceed with XRT.  We had a discussion today about risks and benefits of steroid injection alone versus steroid injection and surgery.  Patient would like to proceed with steroid injection alone at this time.  Description of Procedure: Risks and complications were explained to the patient including bleeding infection seroma hematoma wound related complications damage to surrounding structures hypochromia and hypochromia.  Verbal consent was confirmed and the patient understands the risks and benefits.  The potential complications and alternatives were explained and the patient consents.  The patient expressed understanding the option of not having the procedure and the risks of a scar.  Time out was called and all information was confirmed to be correct.  MA was present as chaperone during this procedure.  The area was prepped and drapped.  Lidocaine  1% with Kenalog  was injected in the bilateral ear keloids.  The amount was 1 cc per keloid.  Gentle pressure was applied.  Hemostasis was achieved.  The patient was given instructions on how to care for the area and a follow up appointment.  Khoen tolerated the procedure well and there were no complications.  The patient will follow-up with me in 4-6 weeks for another session of steroid injections.  Call earlier with any questions or concerns.  Toniette Devera M. Cara Aguino, MD [facility]

## 2024-08-02 ENCOUNTER — Encounter: Admitting: Surgical

## 2024-08-11 ENCOUNTER — Ambulatory Visit

## 2024-08-12 ENCOUNTER — Telehealth: Payer: Self-pay

## 2024-08-12 ENCOUNTER — Ambulatory Visit (INDEPENDENT_AMBULATORY_CARE_PROVIDER_SITE_OTHER)

## 2024-08-12 ENCOUNTER — Ambulatory Visit

## 2024-08-12 VITALS — BP 144/88 | HR 86 | Ht 70.0 in | Wt 228.2 lb

## 2024-08-12 DIAGNOSIS — L910 Hypertrophic scar: Secondary | ICD-10-CM

## 2024-08-12 NOTE — Progress Notes (Addendum)
 Procedure Note   Preoperative Dx: Bilateral ear keloids   Postoperative Dx: Same   Procedure: Bilateral ear keloid injection   Anesthesia: Lidocaine  1% mixed with Kenolog (50%/50%)    Indication for Procedure: 48 year old male with lateral ear keloids. In the last consultation we discussed surgery with XRT.  After discussing risks and benefits of XRT with radiation oncologist, patient decided not to proceed with XRT.  Wants to defer surgery as well.  We had a discussion today about risks and benefits of second steroid injection.  Patient would like to proceed with steroid injection.   Description of Procedure: Risks and complications were explained to the patient including bleeding infection seroma hematoma wound related complications damage to surrounding structures hypochromia and hypochromia.  Verbal consent was confirmed and the patient understands the risks and benefits.  The potential complications and alternatives were explained and the patient consents.  The patient expressed understanding the option of not having the procedure and the risks of a scar.  Time out was called and all information was confirmed to be correct.  MA was present as chaperone during this procedure.   The area was prepped and drapped.  Lidocaine  1% with Kenalog  was injected in the bilateral ear keloids.  The amount was 0.5 cc per keloid.  Gentle pressure was applied.  Hemostasis was achieved.  The patient was given instructions on how to care for the area and a follow up appointment.  Cote tolerated the procedure well and there were no complications.  The patient will follow-up with me in 4-6 weeks for another session of steroid injections.  Call earlier with any questions or concerns.   Varshini Arrants M. Luciana Cammarata, MD [facility]

## 2024-08-12 NOTE — Telephone Encounter (Signed)
 Patient was seen today for F/u Steriod inj with Dr Montorfano. Dr Montorfano wanted him to come back in 3 months which would be the middle of February, Patient requested to come back in 4 months in March and I was told to check with Dr Montorfano to make sure this is ok. Is patient fine to schedule in March instead?

## 2024-08-15 ENCOUNTER — Ambulatory Visit (HOSPITAL_BASED_OUTPATIENT_CLINIC_OR_DEPARTMENT_OTHER): Admit: 2024-08-15

## 2024-08-15 ENCOUNTER — Encounter (HOSPITAL_BASED_OUTPATIENT_CLINIC_OR_DEPARTMENT_OTHER): Payer: Self-pay

## 2024-08-15 SURGERY — EXCISION, MASS, HEAD
Anesthesia: Choice | Laterality: Bilateral

## 2024-08-17 ENCOUNTER — Ambulatory Visit

## 2024-08-26 ENCOUNTER — Encounter

## 2024-09-07 ENCOUNTER — Encounter: Admitting: Surgical

## 2024-09-12 NOTE — Progress Notes (Signed)
 Nicolas [name]                                          MRN: [id number]   09/12/2024   The [facility] Quality Team Specialist reviewed this patient medical record for the purposes of chart review for care gap closure. The following were reviewed: abstraction for care gap closure-glycemic status assessment.    [facility] Quality Team

## 2024-10-12 NOTE — Progress Notes (Signed)
 Nicolas [name]                                          MRN: [id number]   10/12/2024   The [facility] Quality Team Specialist reviewed this patient medical record for the purposes of chart review for care gap closure. The following were reviewed: chart review for care gap closure-glycemic status assessment. RECHECKED FOR ANY UPDATED A1C LABS.    [facility] Quality Team

## 2024-10-22 ENCOUNTER — Other Ambulatory Visit: Payer: Self-pay | Admitting: Internal Medicine

## 2024-11-16 ENCOUNTER — Ambulatory Visit

## 2024-11-25 ENCOUNTER — Encounter (INDEPENDENT_AMBULATORY_CARE_PROVIDER_SITE_OTHER): Admitting: Ophthalmology

## 2024-11-28 ENCOUNTER — Ambulatory Visit: Payer: Self-pay | Admitting: Internal Medicine

## 2024-12-14 ENCOUNTER — Ambulatory Visit

## 2025-03-15 ENCOUNTER — Encounter: Payer: Self-pay | Admitting: Internal Medicine

## 2025-05-03 ENCOUNTER — Ambulatory Visit: Payer: Self-pay
